# Patient Record
Sex: Female | Born: 1940 | Race: White | Hispanic: No | Smoking: Former smoker
Health system: Southern US, Community
[De-identification: ages and names within clinical notes are randomized; demographics above are authoritative.]

## PROBLEM LIST (undated history)

## (undated) DIAGNOSIS — Z1211 Encounter for screening for malignant neoplasm of colon: Secondary | ICD-10-CM

## (undated) DIAGNOSIS — Z853 Personal history of malignant neoplasm of breast: Secondary | ICD-10-CM

## (undated) DIAGNOSIS — N189 Chronic kidney disease, unspecified: Secondary | ICD-10-CM

## (undated) DIAGNOSIS — I2699 Other pulmonary embolism without acute cor pulmonale: Secondary | ICD-10-CM

## (undated) DIAGNOSIS — E042 Nontoxic multinodular goiter: Secondary | ICD-10-CM

## (undated) DIAGNOSIS — T7840XA Allergy, unspecified, initial encounter: Secondary | ICD-10-CM

## (undated) DIAGNOSIS — M1990 Unspecified osteoarthritis, unspecified site: Secondary | ICD-10-CM

## (undated) DIAGNOSIS — I10 Essential (primary) hypertension: Secondary | ICD-10-CM

## (undated) DIAGNOSIS — I639 Cerebral infarction, unspecified: Secondary | ICD-10-CM

## (undated) DIAGNOSIS — J189 Pneumonia, unspecified organism: Secondary | ICD-10-CM

## (undated) DIAGNOSIS — F419 Anxiety disorder, unspecified: Secondary | ICD-10-CM

## (undated) DIAGNOSIS — C801 Malignant (primary) neoplasm, unspecified: Secondary | ICD-10-CM

## (undated) DIAGNOSIS — K219 Gastro-esophageal reflux disease without esophagitis: Secondary | ICD-10-CM

## (undated) DIAGNOSIS — Z1239 Encounter for other screening for malignant neoplasm of breast: Secondary | ICD-10-CM

## (undated) DIAGNOSIS — Z87442 Personal history of urinary calculi: Secondary | ICD-10-CM

## (undated) DIAGNOSIS — J392 Other diseases of pharynx: Secondary | ICD-10-CM

## (undated) DIAGNOSIS — E349 Endocrine disorder, unspecified: Secondary | ICD-10-CM

## (undated) DIAGNOSIS — J449 Chronic obstructive pulmonary disease, unspecified: Secondary | ICD-10-CM

## (undated) DIAGNOSIS — R011 Cardiac murmur, unspecified: Secondary | ICD-10-CM

## (undated) DIAGNOSIS — Z992 Dependence on renal dialysis: Secondary | ICD-10-CM

## (undated) DIAGNOSIS — I509 Heart failure, unspecified: Secondary | ICD-10-CM

## (undated) DIAGNOSIS — Z87891 Personal history of nicotine dependence: Secondary | ICD-10-CM

## (undated) HISTORY — PX: OTHER SURGICAL HISTORY: SHX169

## (undated) HISTORY — DX: Heart failure, unspecified: I50.9

## (undated) HISTORY — DX: Cerebral infarction, unspecified: I63.9

## (undated) HISTORY — DX: Other pulmonary embolism without acute cor pulmonale: I26.99

## (undated) HISTORY — DX: Personal history of malignant neoplasm of breast: Z85.3

## (undated) HISTORY — DX: Endocrine disorder, unspecified: E34.9

## (undated) HISTORY — DX: Malignant (primary) neoplasm, unspecified: C80.1

## (undated) HISTORY — DX: Personal history of nicotine dependence: Z87.891

## (undated) HISTORY — DX: Chronic obstructive pulmonary disease, unspecified: J44.9

## (undated) HISTORY — DX: Allergy, unspecified, initial encounter: T78.40XA

## (undated) HISTORY — DX: Dependence on renal dialysis: Z99.2

## (undated) HISTORY — DX: Encounter for screening for malignant neoplasm of colon: Z12.11

## (undated) HISTORY — DX: Essential (primary) hypertension: I10

## (undated) HISTORY — DX: Encounter for other screening for malignant neoplasm of breast: Z12.39

---
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
## 2003-03-07 HISTORY — PX: FOOT SURGERY: SHX648

## 2003-03-07 HISTORY — PX: APPENDECTOMY: SHX54

## 2003-03-07 HISTORY — PX: BREAST BIOPSY: SHX20

## 2004-02-01 ENCOUNTER — Ambulatory Visit: Payer: Self-pay | Admitting: *Deleted

## 2004-02-01 ENCOUNTER — Ambulatory Visit (HOSPITAL_COMMUNITY): Admission: RE | Admit: 2004-02-01 | Discharge: 2004-02-01 | Payer: Self-pay | Admitting: *Deleted

## 2004-02-08 ENCOUNTER — Ambulatory Visit: Payer: Self-pay | Admitting: Cardiology

## 2004-02-08 ENCOUNTER — Ambulatory Visit (HOSPITAL_COMMUNITY): Admission: RE | Admit: 2004-02-08 | Discharge: 2004-02-08 | Payer: Self-pay | Admitting: *Deleted

## 2004-02-17 ENCOUNTER — Ambulatory Visit: Payer: Self-pay | Admitting: *Deleted

## 2004-03-06 DIAGNOSIS — I10 Essential (primary) hypertension: Secondary | ICD-10-CM

## 2004-03-06 HISTORY — DX: Essential (primary) hypertension: I10

## 2004-04-14 ENCOUNTER — Ambulatory Visit: Payer: Self-pay | Admitting: General Surgery

## 2005-03-06 HISTORY — PX: COLONOSCOPY: SHX174

## 2005-04-27 ENCOUNTER — Ambulatory Visit: Payer: Self-pay | Admitting: General Surgery

## 2006-03-06 DIAGNOSIS — C801 Malignant (primary) neoplasm, unspecified: Secondary | ICD-10-CM

## 2006-03-06 DIAGNOSIS — E349 Endocrine disorder, unspecified: Secondary | ICD-10-CM

## 2006-03-06 DIAGNOSIS — Z853 Personal history of malignant neoplasm of breast: Secondary | ICD-10-CM

## 2006-03-06 HISTORY — DX: Endocrine disorder, unspecified: E34.9

## 2006-03-06 HISTORY — PX: BREAST SURGERY: SHX581

## 2006-03-06 HISTORY — DX: Malignant (primary) neoplasm, unspecified: C80.1

## 2006-03-06 HISTORY — DX: Personal history of malignant neoplasm of breast: Z85.3

## 2006-04-10 ENCOUNTER — Ambulatory Visit (HOSPITAL_COMMUNITY): Admission: RE | Admit: 2006-04-10 | Discharge: 2006-04-10 | Payer: Self-pay | Admitting: Pulmonary Disease

## 2006-04-20 ENCOUNTER — Ambulatory Visit: Payer: Self-pay

## 2006-04-25 ENCOUNTER — Ambulatory Visit (HOSPITAL_COMMUNITY): Admission: RE | Admit: 2006-04-25 | Discharge: 2006-04-25 | Payer: Self-pay | Admitting: Gastroenterology

## 2006-04-25 ENCOUNTER — Ambulatory Visit: Payer: Self-pay | Admitting: Gastroenterology

## 2006-05-08 ENCOUNTER — Ambulatory Visit: Payer: Self-pay | Admitting: General Surgery

## 2006-05-11 ENCOUNTER — Ambulatory Visit: Payer: Self-pay | Admitting: General Surgery

## 2006-05-24 ENCOUNTER — Ambulatory Visit: Payer: Self-pay

## 2006-08-15 ENCOUNTER — Ambulatory Visit: Payer: Self-pay | Admitting: Gastroenterology

## 2006-10-29 ENCOUNTER — Ambulatory Visit: Payer: Self-pay | Admitting: General Surgery

## 2006-11-12 ENCOUNTER — Ambulatory Visit: Payer: Self-pay | Admitting: General Surgery

## 2006-12-05 ENCOUNTER — Ambulatory Visit: Payer: Self-pay | Admitting: Radiation Oncology

## 2006-12-06 ENCOUNTER — Ambulatory Visit: Payer: Self-pay | Admitting: General Surgery

## 2006-12-24 ENCOUNTER — Ambulatory Visit: Payer: Self-pay | Admitting: Radiation Oncology

## 2006-12-28 ENCOUNTER — Ambulatory Visit: Payer: Self-pay | Admitting: General Surgery

## 2007-01-05 ENCOUNTER — Ambulatory Visit: Payer: Self-pay | Admitting: Radiation Oncology

## 2007-01-07 ENCOUNTER — Ambulatory Visit: Payer: Self-pay | Admitting: Radiation Oncology

## 2007-02-04 ENCOUNTER — Ambulatory Visit: Payer: Self-pay | Admitting: Radiation Oncology

## 2007-03-07 ENCOUNTER — Ambulatory Visit: Payer: Self-pay | Admitting: Radiation Oncology

## 2007-04-07 ENCOUNTER — Ambulatory Visit: Payer: Self-pay | Admitting: Radiation Oncology

## 2007-04-16 ENCOUNTER — Ambulatory Visit: Payer: Self-pay

## 2007-04-19 ENCOUNTER — Ambulatory Visit (HOSPITAL_COMMUNITY): Admission: RE | Admit: 2007-04-19 | Discharge: 2007-04-19 | Payer: Self-pay | Admitting: Pulmonary Disease

## 2007-05-05 ENCOUNTER — Ambulatory Visit: Payer: Self-pay | Admitting: Radiation Oncology

## 2007-05-09 ENCOUNTER — Ambulatory Visit: Payer: Self-pay | Admitting: Oncology

## 2007-06-05 ENCOUNTER — Ambulatory Visit: Payer: Self-pay | Admitting: Oncology

## 2007-06-05 ENCOUNTER — Ambulatory Visit: Payer: Self-pay | Admitting: Radiation Oncology

## 2007-06-24 ENCOUNTER — Ambulatory Visit: Payer: Self-pay | Admitting: General Surgery

## 2007-07-05 ENCOUNTER — Ambulatory Visit: Payer: Self-pay | Admitting: Oncology

## 2007-07-05 ENCOUNTER — Ambulatory Visit: Payer: Self-pay | Admitting: Radiation Oncology

## 2007-08-05 ENCOUNTER — Ambulatory Visit: Payer: Self-pay | Admitting: Oncology

## 2007-08-08 ENCOUNTER — Ambulatory Visit: Payer: Self-pay | Admitting: Oncology

## 2007-09-04 ENCOUNTER — Ambulatory Visit: Payer: Self-pay | Admitting: Oncology

## 2007-10-05 ENCOUNTER — Ambulatory Visit: Payer: Self-pay | Admitting: Oncology

## 2007-11-05 ENCOUNTER — Ambulatory Visit: Payer: Self-pay | Admitting: Oncology

## 2007-11-08 ENCOUNTER — Ambulatory Visit (HOSPITAL_COMMUNITY): Admission: RE | Admit: 2007-11-08 | Discharge: 2007-11-08 | Payer: Self-pay | Admitting: Pulmonary Disease

## 2007-11-20 ENCOUNTER — Ambulatory Visit: Payer: Self-pay | Admitting: Oncology

## 2007-11-28 ENCOUNTER — Emergency Department: Payer: Self-pay | Admitting: Emergency Medicine

## 2007-11-28 ENCOUNTER — Other Ambulatory Visit: Payer: Self-pay

## 2007-12-05 ENCOUNTER — Ambulatory Visit: Payer: Self-pay | Admitting: Oncology

## 2007-12-10 ENCOUNTER — Ambulatory Visit (HOSPITAL_COMMUNITY): Admission: RE | Admit: 2007-12-10 | Discharge: 2007-12-10 | Payer: Self-pay | Admitting: Pulmonary Disease

## 2007-12-11 ENCOUNTER — Ambulatory Visit: Payer: Self-pay | Admitting: General Surgery

## 2008-02-04 ENCOUNTER — Ambulatory Visit: Payer: Self-pay | Admitting: Oncology

## 2008-02-12 ENCOUNTER — Ambulatory Visit (HOSPITAL_COMMUNITY): Admission: RE | Admit: 2008-02-12 | Discharge: 2008-02-12 | Payer: Self-pay | Admitting: Pulmonary Disease

## 2008-02-19 ENCOUNTER — Ambulatory Visit: Payer: Self-pay | Admitting: Oncology

## 2008-03-06 ENCOUNTER — Ambulatory Visit: Payer: Self-pay | Admitting: Oncology

## 2008-04-06 ENCOUNTER — Ambulatory Visit: Payer: Self-pay | Admitting: Oncology

## 2008-06-04 ENCOUNTER — Ambulatory Visit: Payer: Self-pay | Admitting: Oncology

## 2008-06-04 ENCOUNTER — Ambulatory Visit: Payer: Self-pay | Admitting: General Surgery

## 2008-06-17 ENCOUNTER — Ambulatory Visit: Payer: Self-pay | Admitting: Oncology

## 2008-06-17 ENCOUNTER — Ambulatory Visit: Payer: Self-pay | Admitting: General Surgery

## 2008-07-04 ENCOUNTER — Ambulatory Visit: Payer: Self-pay | Admitting: Oncology

## 2008-09-03 ENCOUNTER — Ambulatory Visit: Payer: Self-pay | Admitting: Oncology

## 2008-09-21 ENCOUNTER — Ambulatory Visit (HOSPITAL_COMMUNITY): Admission: RE | Admit: 2008-09-21 | Discharge: 2008-09-21 | Payer: Self-pay | Admitting: Nephrology

## 2008-09-23 ENCOUNTER — Ambulatory Visit: Payer: Self-pay | Admitting: Oncology

## 2008-10-04 ENCOUNTER — Ambulatory Visit: Payer: Self-pay | Admitting: Oncology

## 2008-11-04 ENCOUNTER — Ambulatory Visit: Payer: Self-pay | Admitting: Oncology

## 2008-12-04 ENCOUNTER — Ambulatory Visit: Payer: Self-pay | Admitting: Oncology

## 2008-12-24 ENCOUNTER — Ambulatory Visit: Payer: Self-pay | Admitting: Oncology

## 2009-01-04 ENCOUNTER — Ambulatory Visit: Payer: Self-pay | Admitting: Oncology

## 2009-03-06 ENCOUNTER — Ambulatory Visit: Payer: Self-pay | Admitting: Oncology

## 2009-03-17 ENCOUNTER — Ambulatory Visit: Payer: Self-pay | Admitting: Oncology

## 2009-04-06 ENCOUNTER — Ambulatory Visit: Payer: Self-pay | Admitting: Oncology

## 2009-06-30 ENCOUNTER — Ambulatory Visit: Payer: Self-pay | Admitting: General Surgery

## 2009-09-03 ENCOUNTER — Ambulatory Visit: Payer: Self-pay | Admitting: Oncology

## 2009-09-22 ENCOUNTER — Ambulatory Visit: Payer: Self-pay | Admitting: Oncology

## 2009-10-04 ENCOUNTER — Ambulatory Visit: Payer: Self-pay | Admitting: Oncology

## 2009-10-06 ENCOUNTER — Ambulatory Visit: Payer: Self-pay | Admitting: Oncology

## 2010-02-22 ENCOUNTER — Ambulatory Visit (HOSPITAL_COMMUNITY)
Admission: RE | Admit: 2010-02-22 | Discharge: 2010-02-22 | Payer: Self-pay | Source: Home / Self Care | Attending: Pulmonary Disease | Admitting: Pulmonary Disease

## 2010-03-06 DIAGNOSIS — Z87891 Personal history of nicotine dependence: Secondary | ICD-10-CM

## 2010-03-06 HISTORY — PX: REPLACEMENT TOTAL KNEE: SUR1224

## 2010-03-06 HISTORY — DX: Personal history of nicotine dependence: Z87.891

## 2010-03-16 ENCOUNTER — Ambulatory Visit: Payer: Self-pay | Admitting: Oncology

## 2010-04-06 ENCOUNTER — Ambulatory Visit: Payer: Self-pay | Admitting: Oncology

## 2010-07-07 ENCOUNTER — Ambulatory Visit: Payer: Self-pay | Admitting: General Surgery

## 2010-07-19 NOTE — Assessment & Plan Note (Signed)
NAMEMarland Kitchen  REMMI, ARMENTEROS                 CHART#:  [id number]   DATE:  08/15/2006                       DOB:  1940/10/09   PROBLEM LIST:  1. Hypertension.  2. Diverticulosis.  3. Intermittent diarrhea.   REFERRING PHYSICIAN:  Shaune Pollack   SUBJECTIVE:  Ms. Morganti is a 70 year old female who was seen by me in  February 2008 for a colonoscopy.  In March 2008 she was on antibiotics  for sinus infection for a week. She presents as a return patient visit  due to sudden onset of watery stool.  She began having problems in the  middle of April with a burst of watery stool.  She woke up in the middle  of the night and messed her bed.  Then she began to have watery stool,  without warning once or twice a day.  These symptoms lasted 2 to 3 days  and then she had normal stool.  In the middle of May the same thing  happened.  She was awakened in the middle of the night with watery  stool.  She thought in May, that it was related to eating bad hamburger.  She had an episode of vomiting that was associated with the episode in  May.  She reports that every night she drinks a cup of milk and eats  cookies because she does not want to have weak spells. She also may eat  a bowel of cereal as well. The first episode was a large volume of stool  that did not contain any blood and was not black. She may have little  bits of stool thereafter that are watery, sort of grainy.   She denies any abdominal pain associated with these episodes.  She has  not had any flushing or back pain.  She has not traveled anywhere.  She  has well water and is not sure when it was last tested.  She does visit  a nursing home at least once a month.  She does not eat any sugar free  gum or candy.  She does eat 1 to 2 peppermint a day.  She drinks a  carbonated beverage every 2 days.  She does not consume any apple juice.  She occasionally drinks orange juice when she picks up trash for her  son.  She thinks she has lost about 1  to 3 pounds during this time  period.  When she is not having one of these episodes she has normal  formed stool.  The episodes of watery stool have been so bad that she  has lost control of her bowels and had accidents in her clothes and in  her bed.   PAST MEDICAL HISTORY:  Hypertension.   PAST SURGICAL HISTORY:  1. Tonsillectomy.  2. Splenectomy.  3. Appendectomy.   ALLERGIES:  Sulfa and Biaxin.   MEDICATIONS:  1. Aspirin 81 mg daily.  2. Tarka daily.  3. Prilosec 20 mg daily.   FAMILY HISTORY:  She has no family history of colon cancer or colon  polyps, pancreas or liver disease.   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  She smokes 1 pack a day.  She  drinks 4 to 5 cups of coffee a day  with creamer.  She denies any  alcohol use.   REVIEW OF  SYSTEMS:  Per the HPI, otherwise all systems are negative.  She denies any fever during these episodes of  watery diarrhea.   PHYSICAL EXAMINATION:  Weight 140 pounds.  Height 5 foot 8 inches.  Body  mass index 21.3 (healthy).  Temperature is 98.5, blood pressure 142/90.  Pulse 80.  GENERAL:  She is in no apparent distress. Alert and oriented x4. HEENT:  Atraumatic.  Normocephalic.  Pupils equal and reactive to light.  Mouth  no oral lesions.  Posterior pharynx without erythema or exudate.  Her  neck has full range of motion and no lymphadenopathy. Her lungs were  clear to auscultation bilaterally.  Her cardiovascular exam was a regular rhythm, no murmur, no S1 and S2.  Her abdomen bowel sounds are present, soft, nontender, nondistended, no  rebound, no guarding, no pulsatile masses, no abdominal bruits.  Her  extremities without cyanosis, clubbing or edema.  NEURO:  She has no  focal  neurologic deficits.  RECTAL:  She has fair sphincter tone, no masses, heme negative.   LABS:  January 2008: white count 9.8, hemoglobin 14, platelet count 247,  BUN 17, creatinine 1.16, albumin 3.5, AST 15, ALT 8, TSH 0.648.   ASSESSMENT:  Ms.  Neria is a 70 year old female with intermittent  explosive watery diarrhea with  fecal incontinence.  Her ability to  tolerate liquid stools, impaired by her fair sphincter tone.  Her  intermittent watery stool may be related to lactose intolerance, thyroid  disease, functional gut disorder, and a low likelihood of carcinoid  syndrome, C. differential colitis, giardiasis or viral gastroenteritis  which is now resolved.  Thank you for allowing me to see Ms. Tony in  consultation.  My recommendations follows.   RECOMMENDATIONS:  1. Ms. Hollenkamp has been asked to avoid consuming milk products prior      to going to bed.  She has also been asked to add fiber to her diet.      She is given a hand-out on a lactose free diet and high fiber diet.  2. Will check a CMP, TSH and a CBC on today.  If she has evidence of      protein deficiency or anemia, then I would be more aggressive about      pursuing rare causes for diarrhea in this 70 year old lady.  3. Will obtain records from Dr. Juanetta Gosling from the past 6 months.  4. She is instructed that if her watery stool returns, then I will      obtain stool samples at that time.  She is currently having formed      stool and so it would not be of any great diagnostic      benefit to obtain stool samples at this time.  5. She has a followup appointment to see me in 1 month.       Kassie Mends, M.D.  Electronically Signed     SM/MEDQ  D:  08/16/2006  T:  08/16/2006  Job:  [id number]   cc:   Ramon Dredge L. Juanetta Gosling, M.D.

## 2010-07-22 NOTE — Op Note (Signed)
NAME:  Lydia Clark, Lydia Clark                ACCOUNT NO.:  [id number]   MEDICAL RECORD NO.:  [id number]          PATIENT TYPE:  AMB   LOCATION:  DAY                           FACILITY:  [facility]   PHYSICIAN:  Kassie Mends, M.D.      DATE OF BIRTH:  [birth year]   DATE OF PROCEDURE:  DATE OF DISCHARGE:                                PROCEDURE NOTE   REFERRING PHYSICIAN:  Edward L. Juanetta Gosling, M.D.   PROCEDURE:  Colonoscopy.   INDICATION FOR EXAM:  Ms. Janes is a 70 year old female who presents  for average risk colon cancer screening.   FINDINGS:  1. Rare sigmoid diverticulosis.  Otherwise normal colon without      evidence of masses, polyps, inflammatory changes or AVMs.  2. Normal retroflex view of the rectum.   RECOMMENDATIONS:  1. High fiber diet.  Ms. Debruler was given a handout on high fiber      diet and diverticulosis.  2. Screening colonoscopy in 10 years.   MEDICATIONS:  See nursing sheet.   PROCEDURE TECHNIQUE:  Physical exam was performed and informed consent  was obtained for the patient after explaining the benefits, risks and  alternatives to the procedure.  The patient was connected to the monitor  and placed in the left lateral position.  Continuous oxygen was provided  by nasal cannula and IV medicine administered through an indwelling  cannula.  After administration of sedation and rectal exam, the  patient's rectum was intubated and the scope was advanced under direct  visualization to the cecum.  The scope was subsequently removed slowly  by carefully examining the color,  anatomy, texture, and the integrity  of the mucosa on the way out.  The patient was recovering in the  endoscopy suite and discharged home in satisfactory condition.      Kassie Mends, M.D.  Electronically Signed    SM/MEDQ  D:  04/25/2006  T:  04/25/2006  Job:  [id number]   cc:   Ramon Dredge L. Juanetta Gosling, M.D.  Fax: [phone]

## 2010-09-28 ENCOUNTER — Ambulatory Visit: Payer: Self-pay | Admitting: Oncology

## 2010-10-05 ENCOUNTER — Ambulatory Visit: Payer: Self-pay | Admitting: Oncology

## 2010-10-10 ENCOUNTER — Ambulatory Visit: Payer: Self-pay | Admitting: Orthopedic Surgery

## 2010-10-12 ENCOUNTER — Ambulatory Visit: Payer: Self-pay | Admitting: Oncology

## 2010-10-20 ENCOUNTER — Inpatient Hospital Stay: Payer: Self-pay | Admitting: Orthopedic Surgery

## 2010-10-21 LAB — PATHOLOGY REPORT

## 2010-11-05 ENCOUNTER — Ambulatory Visit: Payer: Self-pay | Admitting: Oncology

## 2011-03-07 DIAGNOSIS — Z1211 Encounter for screening for malignant neoplasm of colon: Secondary | ICD-10-CM

## 2011-03-07 DIAGNOSIS — Z1239 Encounter for other screening for malignant neoplasm of breast: Secondary | ICD-10-CM

## 2011-03-07 HISTORY — DX: Encounter for screening for malignant neoplasm of colon: Z12.11

## 2011-03-07 HISTORY — DX: Encounter for other screening for malignant neoplasm of breast: Z12.39

## 2011-03-15 ENCOUNTER — Ambulatory Visit: Payer: Self-pay | Admitting: Oncology

## 2011-03-15 DIAGNOSIS — M7989 Other specified soft tissue disorders: Secondary | ICD-10-CM | POA: Diagnosis not present

## 2011-03-15 DIAGNOSIS — R0789 Other chest pain: Secondary | ICD-10-CM | POA: Diagnosis not present

## 2011-03-15 DIAGNOSIS — Z170 Estrogen receptor positive status [ER+]: Secondary | ICD-10-CM | POA: Diagnosis not present

## 2011-03-15 DIAGNOSIS — C50919 Malignant neoplasm of unspecified site of unspecified female breast: Secondary | ICD-10-CM | POA: Diagnosis not present

## 2011-03-15 DIAGNOSIS — Z79811 Long term (current) use of aromatase inhibitors: Secondary | ICD-10-CM | POA: Diagnosis not present

## 2011-03-15 DIAGNOSIS — Z09 Encounter for follow-up examination after completed treatment for conditions other than malignant neoplasm: Secondary | ICD-10-CM | POA: Diagnosis not present

## 2011-04-03 DIAGNOSIS — M79609 Pain in unspecified limb: Secondary | ICD-10-CM | POA: Diagnosis not present

## 2011-04-03 DIAGNOSIS — R0789 Other chest pain: Secondary | ICD-10-CM | POA: Diagnosis not present

## 2011-04-03 DIAGNOSIS — C50919 Malignant neoplasm of unspecified site of unspecified female breast: Secondary | ICD-10-CM | POA: Diagnosis not present

## 2011-04-03 DIAGNOSIS — Z79811 Long term (current) use of aromatase inhibitors: Secondary | ICD-10-CM | POA: Diagnosis not present

## 2011-04-03 DIAGNOSIS — Z170 Estrogen receptor positive status [ER+]: Secondary | ICD-10-CM | POA: Diagnosis not present

## 2011-04-03 DIAGNOSIS — Z09 Encounter for follow-up examination after completed treatment for conditions other than malignant neoplasm: Secondary | ICD-10-CM | POA: Diagnosis not present

## 2011-04-03 DIAGNOSIS — M7989 Other specified soft tissue disorders: Secondary | ICD-10-CM | POA: Diagnosis not present

## 2011-04-03 DIAGNOSIS — R609 Edema, unspecified: Secondary | ICD-10-CM | POA: Diagnosis not present

## 2011-04-07 ENCOUNTER — Ambulatory Visit: Payer: Self-pay | Admitting: Oncology

## 2011-04-12 DIAGNOSIS — R609 Edema, unspecified: Secondary | ICD-10-CM | POA: Diagnosis not present

## 2011-04-14 ENCOUNTER — Ambulatory Visit (HOSPITAL_COMMUNITY)
Admission: RE | Admit: 2011-04-14 | Discharge: 2011-04-14 | Disposition: A | Discharge status: Home / Self Care | Payer: Medicare Other | Source: Ambulatory Visit | Attending: Pulmonary Disease | Admitting: Pulmonary Disease

## 2011-04-14 DIAGNOSIS — C50919 Malignant neoplasm of unspecified site of unspecified female breast: Secondary | ICD-10-CM | POA: Insufficient documentation

## 2011-04-14 DIAGNOSIS — M7989 Other specified soft tissue disorders: Secondary | ICD-10-CM | POA: Insufficient documentation

## 2011-04-14 DIAGNOSIS — I517 Cardiomegaly: Secondary | ICD-10-CM | POA: Diagnosis not present

## 2011-04-14 NOTE — Progress Notes (Signed)
*  PRELIMINARY RESULTS* Echocardiogram 2D Echocardiogram has been performed.  Lydia Clark, Lydia Clark 04/14/2011, 11:41 AM

## 2011-04-26 DIAGNOSIS — I10 Essential (primary) hypertension: Secondary | ICD-10-CM | POA: Diagnosis not present

## 2011-04-26 DIAGNOSIS — IMO0001 Reserved for inherently not codable concepts without codable children: Secondary | ICD-10-CM | POA: Diagnosis not present

## 2011-04-26 DIAGNOSIS — E785 Hyperlipidemia, unspecified: Secondary | ICD-10-CM | POA: Diagnosis not present

## 2011-04-26 DIAGNOSIS — R609 Edema, unspecified: Secondary | ICD-10-CM | POA: Diagnosis not present

## 2011-04-26 DIAGNOSIS — Z79899 Other long term (current) drug therapy: Secondary | ICD-10-CM | POA: Diagnosis not present

## 2011-05-10 DIAGNOSIS — Z96659 Presence of unspecified artificial knee joint: Secondary | ICD-10-CM | POA: Diagnosis not present

## 2011-06-15 DIAGNOSIS — I10 Essential (primary) hypertension: Secondary | ICD-10-CM | POA: Diagnosis not present

## 2011-06-15 DIAGNOSIS — R609 Edema, unspecified: Secondary | ICD-10-CM | POA: Diagnosis not present

## 2011-07-10 ENCOUNTER — Ambulatory Visit: Payer: Self-pay | Admitting: General Surgery

## 2011-07-10 DIAGNOSIS — Z853 Personal history of malignant neoplasm of breast: Secondary | ICD-10-CM | POA: Diagnosis not present

## 2011-07-10 DIAGNOSIS — N6489 Other specified disorders of breast: Secondary | ICD-10-CM | POA: Diagnosis not present

## 2011-07-25 DIAGNOSIS — M545 Low back pain: Secondary | ICD-10-CM | POA: Diagnosis not present

## 2011-07-25 DIAGNOSIS — M999 Biomechanical lesion, unspecified: Secondary | ICD-10-CM | POA: Diagnosis not present

## 2011-08-01 DIAGNOSIS — M7989 Other specified soft tissue disorders: Secondary | ICD-10-CM | POA: Diagnosis not present

## 2011-08-01 DIAGNOSIS — Z853 Personal history of malignant neoplasm of breast: Secondary | ICD-10-CM | POA: Diagnosis not present

## 2011-08-01 DIAGNOSIS — E079 Disorder of thyroid, unspecified: Secondary | ICD-10-CM | POA: Diagnosis not present

## 2011-08-03 DIAGNOSIS — M7989 Other specified soft tissue disorders: Secondary | ICD-10-CM | POA: Diagnosis not present

## 2011-08-08 DIAGNOSIS — M545 Low back pain: Secondary | ICD-10-CM | POA: Diagnosis not present

## 2011-08-08 DIAGNOSIS — M999 Biomechanical lesion, unspecified: Secondary | ICD-10-CM | POA: Diagnosis not present

## 2011-08-14 DIAGNOSIS — M545 Low back pain: Secondary | ICD-10-CM | POA: Diagnosis not present

## 2011-08-14 DIAGNOSIS — M999 Biomechanical lesion, unspecified: Secondary | ICD-10-CM | POA: Diagnosis not present

## 2011-08-15 DIAGNOSIS — M999 Biomechanical lesion, unspecified: Secondary | ICD-10-CM | POA: Diagnosis not present

## 2011-08-15 DIAGNOSIS — M545 Low back pain: Secondary | ICD-10-CM | POA: Diagnosis not present

## 2011-08-17 DIAGNOSIS — M545 Low back pain: Secondary | ICD-10-CM | POA: Diagnosis not present

## 2011-08-17 DIAGNOSIS — M999 Biomechanical lesion, unspecified: Secondary | ICD-10-CM | POA: Diagnosis not present

## 2011-09-15 DIAGNOSIS — I10 Essential (primary) hypertension: Secondary | ICD-10-CM | POA: Diagnosis not present

## 2011-09-15 DIAGNOSIS — Z0000 Encounter for general adult medical examination without abnormal findings: Secondary | ICD-10-CM | POA: Diagnosis not present

## 2011-09-15 DIAGNOSIS — Z124 Encounter for screening for malignant neoplasm of cervix: Secondary | ICD-10-CM | POA: Diagnosis not present

## 2011-09-28 DIAGNOSIS — E079 Disorder of thyroid, unspecified: Secondary | ICD-10-CM | POA: Diagnosis not present

## 2011-10-02 ENCOUNTER — Ambulatory Visit: Payer: Self-pay | Admitting: Oncology

## 2011-10-02 DIAGNOSIS — C50919 Malignant neoplasm of unspecified site of unspecified female breast: Secondary | ICD-10-CM | POA: Diagnosis not present

## 2011-10-02 DIAGNOSIS — Z79811 Long term (current) use of aromatase inhibitors: Secondary | ICD-10-CM | POA: Diagnosis not present

## 2011-10-02 DIAGNOSIS — Z79899 Other long term (current) drug therapy: Secondary | ICD-10-CM | POA: Diagnosis not present

## 2011-10-02 DIAGNOSIS — M899 Disorder of bone, unspecified: Secondary | ICD-10-CM | POA: Diagnosis not present

## 2011-10-02 DIAGNOSIS — Z9089 Acquired absence of other organs: Secondary | ICD-10-CM | POA: Diagnosis not present

## 2011-10-02 DIAGNOSIS — I10 Essential (primary) hypertension: Secondary | ICD-10-CM | POA: Diagnosis not present

## 2011-10-02 DIAGNOSIS — M949 Disorder of cartilage, unspecified: Secondary | ICD-10-CM | POA: Diagnosis not present

## 2011-10-02 DIAGNOSIS — F329 Major depressive disorder, single episode, unspecified: Secondary | ICD-10-CM | POA: Diagnosis not present

## 2011-10-02 DIAGNOSIS — Z7982 Long term (current) use of aspirin: Secondary | ICD-10-CM | POA: Diagnosis not present

## 2011-10-02 DIAGNOSIS — Z170 Estrogen receptor positive status [ER+]: Secondary | ICD-10-CM | POA: Diagnosis not present

## 2011-10-05 ENCOUNTER — Ambulatory Visit: Payer: Self-pay | Admitting: Oncology

## 2011-10-05 DIAGNOSIS — Z13820 Encounter for screening for osteoporosis: Secondary | ICD-10-CM | POA: Diagnosis not present

## 2011-10-05 DIAGNOSIS — M899 Disorder of bone, unspecified: Secondary | ICD-10-CM | POA: Diagnosis not present

## 2011-10-05 DIAGNOSIS — C50919 Malignant neoplasm of unspecified site of unspecified female breast: Secondary | ICD-10-CM | POA: Diagnosis not present

## 2011-10-05 DIAGNOSIS — Z79811 Long term (current) use of aromatase inhibitors: Secondary | ICD-10-CM | POA: Diagnosis not present

## 2011-10-05 DIAGNOSIS — Z170 Estrogen receptor positive status [ER+]: Secondary | ICD-10-CM | POA: Diagnosis not present

## 2011-10-05 DIAGNOSIS — M818 Other osteoporosis without current pathological fracture: Secondary | ICD-10-CM | POA: Diagnosis not present

## 2011-10-13 DIAGNOSIS — R1310 Dysphagia, unspecified: Secondary | ICD-10-CM | POA: Diagnosis not present

## 2011-10-13 DIAGNOSIS — K219 Gastro-esophageal reflux disease without esophagitis: Secondary | ICD-10-CM | POA: Diagnosis not present

## 2011-10-13 DIAGNOSIS — E041 Nontoxic single thyroid nodule: Secondary | ICD-10-CM | POA: Diagnosis not present

## 2011-10-13 DIAGNOSIS — R490 Dysphonia: Secondary | ICD-10-CM | POA: Diagnosis not present

## 2011-10-17 DIAGNOSIS — Z13820 Encounter for screening for osteoporosis: Secondary | ICD-10-CM | POA: Diagnosis not present

## 2011-11-05 ENCOUNTER — Ambulatory Visit: Payer: Self-pay | Admitting: Oncology

## 2011-11-14 DIAGNOSIS — E041 Nontoxic single thyroid nodule: Secondary | ICD-10-CM | POA: Diagnosis not present

## 2011-11-14 DIAGNOSIS — R1310 Dysphagia, unspecified: Secondary | ICD-10-CM | POA: Diagnosis not present

## 2011-11-15 ENCOUNTER — Other Ambulatory Visit (HOSPITAL_COMMUNITY): Payer: Self-pay | Admitting: Nephrology

## 2011-11-15 DIAGNOSIS — N289 Disorder of kidney and ureter, unspecified: Secondary | ICD-10-CM

## 2011-11-15 DIAGNOSIS — D649 Anemia, unspecified: Secondary | ICD-10-CM | POA: Diagnosis not present

## 2011-11-15 DIAGNOSIS — I10 Essential (primary) hypertension: Secondary | ICD-10-CM | POA: Diagnosis not present

## 2011-11-15 DIAGNOSIS — N184 Chronic kidney disease, stage 4 (severe): Secondary | ICD-10-CM | POA: Diagnosis not present

## 2011-12-07 ENCOUNTER — Ambulatory Visit (HOSPITAL_COMMUNITY)
Admission: RE | Admit: 2011-12-07 | Discharge: 2011-12-07 | Disposition: A | Discharge status: Home / Self Care | Payer: Medicare Other | Source: Ambulatory Visit | Attending: Nephrology | Admitting: Nephrology

## 2011-12-07 DIAGNOSIS — N184 Chronic kidney disease, stage 4 (severe): Secondary | ICD-10-CM | POA: Diagnosis not present

## 2011-12-07 DIAGNOSIS — R809 Proteinuria, unspecified: Secondary | ICD-10-CM | POA: Diagnosis not present

## 2011-12-07 DIAGNOSIS — Q619 Cystic kidney disease, unspecified: Secondary | ICD-10-CM | POA: Diagnosis not present

## 2011-12-07 DIAGNOSIS — N289 Disorder of kidney and ureter, unspecified: Secondary | ICD-10-CM | POA: Insufficient documentation

## 2011-12-07 DIAGNOSIS — N281 Cyst of kidney, acquired: Secondary | ICD-10-CM | POA: Diagnosis not present

## 2011-12-07 DIAGNOSIS — R609 Edema, unspecified: Secondary | ICD-10-CM | POA: Diagnosis not present

## 2011-12-07 DIAGNOSIS — N189 Chronic kidney disease, unspecified: Secondary | ICD-10-CM | POA: Diagnosis not present

## 2011-12-07 DIAGNOSIS — I10 Essential (primary) hypertension: Secondary | ICD-10-CM | POA: Diagnosis not present

## 2011-12-21 DIAGNOSIS — Z23 Encounter for immunization: Secondary | ICD-10-CM | POA: Diagnosis not present

## 2011-12-21 DIAGNOSIS — J449 Chronic obstructive pulmonary disease, unspecified: Secondary | ICD-10-CM | POA: Diagnosis not present

## 2011-12-21 DIAGNOSIS — E785 Hyperlipidemia, unspecified: Secondary | ICD-10-CM | POA: Diagnosis not present

## 2011-12-21 DIAGNOSIS — N19 Unspecified kidney failure: Secondary | ICD-10-CM | POA: Diagnosis not present

## 2011-12-21 DIAGNOSIS — R609 Edema, unspecified: Secondary | ICD-10-CM | POA: Diagnosis not present

## 2012-01-03 DIAGNOSIS — E559 Vitamin D deficiency, unspecified: Secondary | ICD-10-CM | POA: Diagnosis not present

## 2012-01-03 DIAGNOSIS — R809 Proteinuria, unspecified: Secondary | ICD-10-CM | POA: Diagnosis not present

## 2012-01-03 DIAGNOSIS — N184 Chronic kidney disease, stage 4 (severe): Secondary | ICD-10-CM | POA: Diagnosis not present

## 2012-01-03 DIAGNOSIS — I10 Essential (primary) hypertension: Secondary | ICD-10-CM | POA: Diagnosis not present

## 2012-01-24 DIAGNOSIS — Z130 Encounter for screening for diseases of the blood and blood-forming organs and certain disorders involving the immune mechanism: Secondary | ICD-10-CM | POA: Diagnosis not present

## 2012-01-24 DIAGNOSIS — R809 Proteinuria, unspecified: Secondary | ICD-10-CM | POA: Diagnosis not present

## 2012-01-24 DIAGNOSIS — N032 Chronic nephritic syndrome with diffuse membranous glomerulonephritis: Secondary | ICD-10-CM | POA: Diagnosis not present

## 2012-01-25 DIAGNOSIS — N269 Renal sclerosis, unspecified: Secondary | ICD-10-CM | POA: Diagnosis not present

## 2012-02-26 DIAGNOSIS — N184 Chronic kidney disease, stage 4 (severe): Secondary | ICD-10-CM | POA: Diagnosis not present

## 2012-02-26 DIAGNOSIS — I10 Essential (primary) hypertension: Secondary | ICD-10-CM | POA: Diagnosis not present

## 2012-02-26 DIAGNOSIS — R809 Proteinuria, unspecified: Secondary | ICD-10-CM | POA: Diagnosis not present

## 2012-03-06 ENCOUNTER — Ambulatory Visit: Payer: Self-pay | Admitting: Oncology

## 2012-03-06 DIAGNOSIS — Z9089 Acquired absence of other organs: Secondary | ICD-10-CM | POA: Diagnosis not present

## 2012-03-06 DIAGNOSIS — M899 Disorder of bone, unspecified: Secondary | ICD-10-CM | POA: Diagnosis not present

## 2012-03-06 DIAGNOSIS — C50919 Malignant neoplasm of unspecified site of unspecified female breast: Secondary | ICD-10-CM | POA: Diagnosis not present

## 2012-03-06 DIAGNOSIS — Z7982 Long term (current) use of aspirin: Secondary | ICD-10-CM | POA: Diagnosis not present

## 2012-03-06 DIAGNOSIS — Z79811 Long term (current) use of aromatase inhibitors: Secondary | ICD-10-CM | POA: Diagnosis not present

## 2012-03-06 DIAGNOSIS — I10 Essential (primary) hypertension: Secondary | ICD-10-CM | POA: Diagnosis not present

## 2012-03-06 DIAGNOSIS — F329 Major depressive disorder, single episode, unspecified: Secondary | ICD-10-CM | POA: Diagnosis not present

## 2012-03-06 DIAGNOSIS — Z170 Estrogen receptor positive status [ER+]: Secondary | ICD-10-CM | POA: Diagnosis not present

## 2012-03-06 DIAGNOSIS — Z992 Dependence on renal dialysis: Secondary | ICD-10-CM

## 2012-03-06 DIAGNOSIS — F411 Generalized anxiety disorder: Secondary | ICD-10-CM | POA: Diagnosis not present

## 2012-03-06 DIAGNOSIS — Z79899 Other long term (current) drug therapy: Secondary | ICD-10-CM | POA: Diagnosis not present

## 2012-03-06 DIAGNOSIS — N189 Chronic kidney disease, unspecified: Secondary | ICD-10-CM

## 2012-03-06 HISTORY — DX: Chronic kidney disease, unspecified: N18.9

## 2012-03-06 HISTORY — DX: Dependence on renal dialysis: Z99.2

## 2012-03-22 DIAGNOSIS — J449 Chronic obstructive pulmonary disease, unspecified: Secondary | ICD-10-CM | POA: Diagnosis not present

## 2012-03-22 DIAGNOSIS — N19 Unspecified kidney failure: Secondary | ICD-10-CM | POA: Diagnosis not present

## 2012-03-22 DIAGNOSIS — E785 Hyperlipidemia, unspecified: Secondary | ICD-10-CM | POA: Diagnosis not present

## 2012-04-01 DIAGNOSIS — Z79811 Long term (current) use of aromatase inhibitors: Secondary | ICD-10-CM | POA: Diagnosis not present

## 2012-04-01 DIAGNOSIS — C50919 Malignant neoplasm of unspecified site of unspecified female breast: Secondary | ICD-10-CM | POA: Diagnosis not present

## 2012-04-01 DIAGNOSIS — M949 Disorder of cartilage, unspecified: Secondary | ICD-10-CM | POA: Diagnosis not present

## 2012-04-01 DIAGNOSIS — Z170 Estrogen receptor positive status [ER+]: Secondary | ICD-10-CM | POA: Diagnosis not present

## 2012-04-06 ENCOUNTER — Ambulatory Visit: Payer: Self-pay | Admitting: Oncology

## 2012-04-12 DIAGNOSIS — R209 Unspecified disturbances of skin sensation: Secondary | ICD-10-CM | POA: Diagnosis not present

## 2012-04-12 DIAGNOSIS — R609 Edema, unspecified: Secondary | ICD-10-CM | POA: Diagnosis not present

## 2012-04-12 DIAGNOSIS — D649 Anemia, unspecified: Secondary | ICD-10-CM | POA: Diagnosis not present

## 2012-04-12 DIAGNOSIS — N184 Chronic kidney disease, stage 4 (severe): Secondary | ICD-10-CM | POA: Diagnosis not present

## 2012-04-12 DIAGNOSIS — Z853 Personal history of malignant neoplasm of breast: Secondary | ICD-10-CM | POA: Diagnosis not present

## 2012-04-12 DIAGNOSIS — F17200 Nicotine dependence, unspecified, uncomplicated: Secondary | ICD-10-CM | POA: Diagnosis not present

## 2012-04-12 DIAGNOSIS — J449 Chronic obstructive pulmonary disease, unspecified: Secondary | ICD-10-CM | POA: Diagnosis not present

## 2012-04-12 DIAGNOSIS — R112 Nausea with vomiting, unspecified: Secondary | ICD-10-CM | POA: Diagnosis not present

## 2012-04-12 DIAGNOSIS — N032 Chronic nephritic syndrome with diffuse membranous glomerulonephritis: Secondary | ICD-10-CM | POA: Diagnosis not present

## 2012-04-12 DIAGNOSIS — R809 Proteinuria, unspecified: Secondary | ICD-10-CM | POA: Diagnosis not present

## 2012-04-12 DIAGNOSIS — R252 Cramp and spasm: Secondary | ICD-10-CM | POA: Diagnosis not present

## 2012-04-12 DIAGNOSIS — R439 Unspecified disturbances of smell and taste: Secondary | ICD-10-CM | POA: Diagnosis not present

## 2012-04-12 DIAGNOSIS — I129 Hypertensive chronic kidney disease with stage 1 through stage 4 chronic kidney disease, or unspecified chronic kidney disease: Secondary | ICD-10-CM | POA: Diagnosis not present

## 2012-04-12 DIAGNOSIS — R5381 Other malaise: Secondary | ICD-10-CM | POA: Diagnosis not present

## 2012-04-12 DIAGNOSIS — E7800 Pure hypercholesterolemia, unspecified: Secondary | ICD-10-CM | POA: Diagnosis not present

## 2012-05-14 DIAGNOSIS — R1310 Dysphagia, unspecified: Secondary | ICD-10-CM | POA: Diagnosis not present

## 2012-05-14 DIAGNOSIS — E041 Nontoxic single thyroid nodule: Secondary | ICD-10-CM | POA: Diagnosis not present

## 2012-05-14 DIAGNOSIS — L0201 Cutaneous abscess of face: Secondary | ICD-10-CM | POA: Diagnosis not present

## 2012-05-18 DIAGNOSIS — N184 Chronic kidney disease, stage 4 (severe): Secondary | ICD-10-CM | POA: Diagnosis not present

## 2012-05-18 DIAGNOSIS — R809 Proteinuria, unspecified: Secondary | ICD-10-CM | POA: Diagnosis not present

## 2012-05-18 DIAGNOSIS — I10 Essential (primary) hypertension: Secondary | ICD-10-CM | POA: Diagnosis not present

## 2012-05-21 ENCOUNTER — Encounter: Payer: Self-pay | Admitting: *Deleted

## 2012-06-05 ENCOUNTER — Encounter: Payer: Self-pay | Admitting: General Surgery

## 2012-06-13 DIAGNOSIS — Z79899 Other long term (current) drug therapy: Secondary | ICD-10-CM | POA: Diagnosis not present

## 2012-06-13 DIAGNOSIS — D649 Anemia, unspecified: Secondary | ICD-10-CM | POA: Diagnosis not present

## 2012-06-13 DIAGNOSIS — R809 Proteinuria, unspecified: Secondary | ICD-10-CM | POA: Diagnosis not present

## 2012-06-13 DIAGNOSIS — N184 Chronic kidney disease, stage 4 (severe): Secondary | ICD-10-CM | POA: Diagnosis not present

## 2012-06-17 DIAGNOSIS — Z79899 Other long term (current) drug therapy: Secondary | ICD-10-CM | POA: Diagnosis not present

## 2012-06-17 DIAGNOSIS — N189 Chronic kidney disease, unspecified: Secondary | ICD-10-CM | POA: Diagnosis not present

## 2012-06-17 DIAGNOSIS — R809 Proteinuria, unspecified: Secondary | ICD-10-CM | POA: Diagnosis not present

## 2012-06-17 DIAGNOSIS — I10 Essential (primary) hypertension: Secondary | ICD-10-CM | POA: Diagnosis not present

## 2012-06-19 DIAGNOSIS — N185 Chronic kidney disease, stage 5: Secondary | ICD-10-CM | POA: Diagnosis not present

## 2012-06-19 DIAGNOSIS — I10 Essential (primary) hypertension: Secondary | ICD-10-CM | POA: Diagnosis not present

## 2012-06-20 DIAGNOSIS — J449 Chronic obstructive pulmonary disease, unspecified: Secondary | ICD-10-CM | POA: Diagnosis not present

## 2012-06-20 DIAGNOSIS — N19 Unspecified kidney failure: Secondary | ICD-10-CM | POA: Diagnosis not present

## 2012-06-20 DIAGNOSIS — R609 Edema, unspecified: Secondary | ICD-10-CM | POA: Diagnosis not present

## 2012-06-20 DIAGNOSIS — I129 Hypertensive chronic kidney disease with stage 1 through stage 4 chronic kidney disease, or unspecified chronic kidney disease: Secondary | ICD-10-CM | POA: Diagnosis not present

## 2012-06-24 ENCOUNTER — Encounter: Payer: Self-pay | Admitting: Vascular Surgery

## 2012-06-24 DIAGNOSIS — H00029 Hordeolum internum unspecified eye, unspecified eyelid: Secondary | ICD-10-CM | POA: Diagnosis not present

## 2012-06-24 DIAGNOSIS — N186 End stage renal disease: Secondary | ICD-10-CM | POA: Diagnosis not present

## 2012-06-26 ENCOUNTER — Other Ambulatory Visit: Payer: Self-pay | Admitting: *Deleted

## 2012-06-28 ENCOUNTER — Encounter (HOSPITAL_COMMUNITY): Payer: Self-pay | Admitting: Pharmacy Technician

## 2012-06-28 NOTE — Pre-Procedure Instructions (Addendum)
Lydia Clark  06/28/2012   Your procedure is scheduled on: 07/02/12  Report to [facility] at 730 AM.  Call this number if you have problems the morning of surgery: [phone]   Remember:   Do not eat food or drink liquids after midnight.   Take these medicines the morning of surgery with A SIP OF WATER: ativan if needed, advair if needed ,omeprazole ,spirva if needed   Do not wear jewelry, make-up or nail polish.  Do not wear lotions, powders, or perfumes. You may wear deodorant.  Do not shave 48 hours prior to surgery. Men may shave face and neck.  Do not bring valuables to the hospital.  Contacts, dentures or bridgework may not be worn into surgery.  Leave suitcase in the car. After surgery it may be brought to your room.  For patients admitted to the hospital, checkout time is 11:00 AM the day of  discharge.   Patients discharged the day of surgery will not be allowed to drive  home.  Name and phone number of your driver:   Special Instructions: Shower using CHG 2 nights before surgery and the night before surgery.  If you shower the day of surgery use CHG.  Use special wash - you have one bottle of CHG for all showers.  You should use approximately 1/3 of the bottle for each shower.   Please read over the following fact sheets that you were given: Pain Booklet, Coughing and Deep Breathing, MRSA Information and Surgical Site Infection Prevention

## 2012-07-01 ENCOUNTER — Ambulatory Visit (HOSPITAL_COMMUNITY)
Admission: RE | Admit: 2012-07-01 | Discharge: 2012-07-01 | Disposition: A | Discharge status: Home / Self Care | Payer: Medicare Other | Source: Ambulatory Visit | Attending: Anesthesiology | Admitting: Anesthesiology

## 2012-07-01 ENCOUNTER — Encounter (HOSPITAL_COMMUNITY)
Admission: RE | Admit: 2012-07-01 | Discharge: 2012-07-01 | Disposition: A | Discharge status: Home / Self Care | Payer: Medicare Other | Source: Ambulatory Visit | Attending: Vascular Surgery | Admitting: Vascular Surgery

## 2012-07-01 ENCOUNTER — Encounter (HOSPITAL_COMMUNITY): Payer: Self-pay

## 2012-07-01 DIAGNOSIS — Z91038 Other insect allergy status: Secondary | ICD-10-CM | POA: Diagnosis not present

## 2012-07-01 DIAGNOSIS — E042 Nontoxic multinodular goiter: Secondary | ICD-10-CM | POA: Diagnosis not present

## 2012-07-01 DIAGNOSIS — Z882 Allergy status to sulfonamides status: Secondary | ICD-10-CM | POA: Diagnosis not present

## 2012-07-01 DIAGNOSIS — Z853 Personal history of malignant neoplasm of breast: Secondary | ICD-10-CM | POA: Diagnosis not present

## 2012-07-01 DIAGNOSIS — J449 Chronic obstructive pulmonary disease, unspecified: Secondary | ICD-10-CM | POA: Diagnosis not present

## 2012-07-01 DIAGNOSIS — Z888 Allergy status to other drugs, medicaments and biological substances status: Secondary | ICD-10-CM | POA: Diagnosis not present

## 2012-07-01 DIAGNOSIS — J4489 Other specified chronic obstructive pulmonary disease: Secondary | ICD-10-CM | POA: Diagnosis not present

## 2012-07-01 DIAGNOSIS — K219 Gastro-esophageal reflux disease without esophagitis: Secondary | ICD-10-CM | POA: Diagnosis not present

## 2012-07-01 DIAGNOSIS — I120 Hypertensive chronic kidney disease with stage 5 chronic kidney disease or end stage renal disease: Secondary | ICD-10-CM | POA: Diagnosis not present

## 2012-07-01 DIAGNOSIS — Z86711 Personal history of pulmonary embolism: Secondary | ICD-10-CM | POA: Diagnosis not present

## 2012-07-01 DIAGNOSIS — F17200 Nicotine dependence, unspecified, uncomplicated: Secondary | ICD-10-CM | POA: Diagnosis not present

## 2012-07-01 DIAGNOSIS — M129 Arthropathy, unspecified: Secondary | ICD-10-CM | POA: Diagnosis not present

## 2012-07-01 DIAGNOSIS — N186 End stage renal disease: Secondary | ICD-10-CM | POA: Diagnosis not present

## 2012-07-01 HISTORY — DX: Anxiety disorder, unspecified: F41.9

## 2012-07-01 HISTORY — DX: Personal history of urinary calculi: Z87.442

## 2012-07-01 HISTORY — DX: Other diseases of pharynx: J39.2

## 2012-07-01 HISTORY — DX: Nontoxic multinodular goiter: E04.2

## 2012-07-01 HISTORY — DX: Unspecified osteoarthritis, unspecified site: M19.90

## 2012-07-01 HISTORY — DX: Pneumonia, unspecified organism: J18.9

## 2012-07-01 HISTORY — DX: Gastro-esophageal reflux disease without esophagitis: K21.9

## 2012-07-01 HISTORY — DX: Chronic kidney disease, unspecified: N18.9

## 2012-07-01 HISTORY — DX: Cardiac murmur, unspecified: R01.1

## 2012-07-01 LAB — SURGICAL PCR SCREEN: Staphylococcus aureus: NEGATIVE

## 2012-07-01 MED ORDER — DEXTROSE 5 % IV SOLN
1.5000 g | INTRAVENOUS | Status: AC
  Administered 2012-07-02: 1.5 g via INTRAVENOUS
  Filled 2012-07-01: qty 1.5

## 2012-07-01 NOTE — Progress Notes (Addendum)
req'd notes , ultrasound result of throat/neck done 14 at [facility] ent dr Andee Poles. rec'd info .allison consulted with patient

## 2012-07-01 NOTE — Progress Notes (Signed)
Anesthesia PAT Evaluation:  Patient is a 72 year old female scheduled for Diatek catheter insertion on 07/02/12.  She is scheduled to start hemodialysis the following day in [place]. 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  History also lists PE, but she denies known history of this. She is being followed for a "neck mass" by Dr. Andee Poles of [facility].  ENT records indicate she has a multinodular goiter with previous biopsy in ~ 2008 showing colloid nodule. Her last thyroid ultrasound was in July 2013 with plans to repeat July 2013 to determine if repeat biopsy of thyroidectomy is indicated.  Nephrologist is Dr. Wolfgang Phoenix.  PCP is Dr. Kari Baars.  EKG on 07/01/12 showed NSR, LAFB, anterior infarct (poor r wave progression), age undetermined, non-specific ST/T wave abnormality.  Currently, there are no comparison EKGs available.  She had a nuclear stress test > 3 years ago (negative stress Cardiolite study on 02/08/04.  By notes, EKG then showed NSR, LAE, RV conduction abnormality, and delayed R-wave progression).    Echo on 04/14/11 showed: - Left ventricle: The cavity size was normal. There was mild concentric hypertrophy. Systolic function was hyperdynamic. The estimated ejection fraction was in the range of 70% to 75%. Wall motion was normal; there were no regional wall motion abnormalities. - Aortic valve: Trivial regurgitation. - Right ventricle: The cavity size was normal. Wall thickness was mildly increased. - Atrial septum: No defect or patent foramen ovale was identified.  She denies chest pain.  She has had intermittent SOB, particularly in the mornings which she has attributed to her worsening renal function and possibly pollen.  Her edema has improved somewhat with adjustment in toresemide. Before her worsening renal  function within the past few months, she considered herself active--able to climb stairs, do moderate housework. Heart RRR, lungs clear but diminished.  She does have an anterior neck thyroid goiter, more prominent on the left side. She reports she is able to lie flat without breathing difficulties.    CXR report on 07/01/12 showed: Cardiomediastinal silhouette appears normal. No acute pulmonary disease is noted. Bony thorax is intact. No pneumothorax or pleural effusion is noted. Hyperexpansion of the lungs is noted consistent with chronic obstructive pulmonary disease.  She is scheduled for an ISTAT on the day of surgery.  Records received after her PAT visit show previous labs from 06/24/12 with a glucose 65, BUN 81, Cr 4.28, K+ 5.8 (treated with kayexalate), Na 137, Ca 9.4, Ph 6.5.  H/H on 06/13/12 were 11.0/33.0.  Patient is in need of hemodialysis--to start on 07/03/12.  She will require HD access and is scheduled for Diatek insertion tomorrow.  If labs are acceptable then would anticipate she could proceed as planned.  She will be evaluated by her surgeon and anesthesiologist on the day of surgery.  Velna Ochs [facility]/Anesthesiology Phone [phone] 07/01/2012 3:33 PM

## 2012-07-02 ENCOUNTER — Ambulatory Visit (HOSPITAL_COMMUNITY): Payer: Medicare Other | Admitting: Certified Registered"

## 2012-07-02 ENCOUNTER — Ambulatory Visit (HOSPITAL_COMMUNITY): Payer: Medicare Other

## 2012-07-02 ENCOUNTER — Ambulatory Visit: Payer: TRICARE For Life (TFL) | Admitting: Vascular Surgery

## 2012-07-02 ENCOUNTER — Encounter (HOSPITAL_COMMUNITY): Payer: Self-pay | Admitting: Vascular Surgery

## 2012-07-02 ENCOUNTER — Ambulatory Visit (HOSPITAL_COMMUNITY)
Admission: RE | Admit: 2012-07-02 | Discharge: 2012-07-02 | Disposition: A | Discharge status: Home / Self Care | Payer: Medicare Other | Source: Ambulatory Visit | Attending: Vascular Surgery | Admitting: Vascular Surgery

## 2012-07-02 ENCOUNTER — Encounter (HOSPITAL_COMMUNITY): Payer: Self-pay | Admitting: *Deleted

## 2012-07-02 ENCOUNTER — Encounter (HOSPITAL_COMMUNITY)
Admission: RE | Disposition: A | Discharge status: Home / Self Care | Payer: Self-pay | Source: Ambulatory Visit | Attending: Vascular Surgery

## 2012-07-02 DIAGNOSIS — Z853 Personal history of malignant neoplasm of breast: Secondary | ICD-10-CM | POA: Insufficient documentation

## 2012-07-02 DIAGNOSIS — I120 Hypertensive chronic kidney disease with stage 5 chronic kidney disease or end stage renal disease: Secondary | ICD-10-CM | POA: Insufficient documentation

## 2012-07-02 DIAGNOSIS — N186 End stage renal disease: Secondary | ICD-10-CM | POA: Diagnosis not present

## 2012-07-02 DIAGNOSIS — J449 Chronic obstructive pulmonary disease, unspecified: Secondary | ICD-10-CM | POA: Insufficient documentation

## 2012-07-02 DIAGNOSIS — Z882 Allergy status to sulfonamides status: Secondary | ICD-10-CM | POA: Insufficient documentation

## 2012-07-02 DIAGNOSIS — J4489 Other specified chronic obstructive pulmonary disease: Secondary | ICD-10-CM | POA: Insufficient documentation

## 2012-07-02 DIAGNOSIS — M129 Arthropathy, unspecified: Secondary | ICD-10-CM | POA: Insufficient documentation

## 2012-07-02 DIAGNOSIS — Z86711 Personal history of pulmonary embolism: Secondary | ICD-10-CM | POA: Insufficient documentation

## 2012-07-02 DIAGNOSIS — K219 Gastro-esophageal reflux disease without esophagitis: Secondary | ICD-10-CM | POA: Diagnosis not present

## 2012-07-02 DIAGNOSIS — Z91038 Other insect allergy status: Secondary | ICD-10-CM | POA: Insufficient documentation

## 2012-07-02 DIAGNOSIS — F17200 Nicotine dependence, unspecified, uncomplicated: Secondary | ICD-10-CM | POA: Insufficient documentation

## 2012-07-02 DIAGNOSIS — I10 Essential (primary) hypertension: Secondary | ICD-10-CM | POA: Diagnosis not present

## 2012-07-02 DIAGNOSIS — E042 Nontoxic multinodular goiter: Secondary | ICD-10-CM | POA: Insufficient documentation

## 2012-07-02 DIAGNOSIS — Z888 Allergy status to other drugs, medicaments and biological substances status: Secondary | ICD-10-CM | POA: Insufficient documentation

## 2012-07-02 HISTORY — PX: INSERTION OF DIALYSIS CATHETER: SHX1324

## 2012-07-02 LAB — POCT I-STAT 4, (NA,K, GLUC, HGB,HCT)
Glucose, Bld: 87 mg/dL (ref 70–99)
HCT: 32 % — ABNORMAL LOW (ref 36.0–46.0)
Hemoglobin: 10.9 g/dL — ABNORMAL LOW (ref 12.0–15.0)
Sodium: 137 mEq/L (ref 135–145)

## 2012-07-02 SURGERY — INSERTION OF DIALYSIS CATHETER
Anesthesia: Monitor Anesthesia Care | Site: Neck | Laterality: Right | Wound class: Clean

## 2012-07-02 MED ORDER — FENTANYL CITRATE 0.05 MG/ML IJ SOLN
INTRAMUSCULAR | Status: DC | PRN
  Administered 2012-07-02 (×2): 50 ug via INTRAVENOUS

## 2012-07-02 MED ORDER — OXYCODONE HCL 5 MG/5ML PO SOLN
5.0000 mg | Freq: Once | ORAL | Status: DC | PRN
Start: 2012-07-02 — End: 2012-07-02

## 2012-07-02 MED ORDER — ACETAMINOPHEN-CODEINE #3 300-30 MG PO TABS: 1.0000 | ORAL_TABLET | ORAL | Status: DC | PRN

## 2012-07-02 MED ORDER — 0.9 % SODIUM CHLORIDE (POUR BTL) OPTIME
TOPICAL | Status: DC | PRN
  Administered 2012-07-02: 1000 mL

## 2012-07-02 MED ORDER — PROPOFOL INFUSION 10 MG/ML OPTIME
INTRAVENOUS | Status: DC | PRN
  Administered 2012-07-02: 50 ug/kg/min via INTRAVENOUS

## 2012-07-02 MED ORDER — SODIUM CHLORIDE 0.9 % IV SOLN
INTRAVENOUS | Status: DC
  Administered 2012-07-02 (×2): via INTRAVENOUS

## 2012-07-02 MED ORDER — HEPARIN SODIUM (PORCINE) 1000 UNIT/ML IJ SOLN
INTRAMUSCULAR | Status: DC | PRN
  Administered 2012-07-02: 1000 [IU]

## 2012-07-02 MED ORDER — LIDOCAINE HCL (PF) 1 % IJ SOLN
INTRAMUSCULAR | Status: AC
  Filled 2012-07-02: qty 30

## 2012-07-02 MED ORDER — SODIUM CHLORIDE 0.9 % IR SOLN
Status: DC | PRN
  Administered 2012-07-02: 12:00:00

## 2012-07-02 MED ORDER — LIDOCAINE HCL (PF) 1 % IJ SOLN
INTRAMUSCULAR | Status: DC | PRN
  Administered 2012-07-02: 30 mL

## 2012-07-02 MED ORDER — OXYCODONE HCL 5 MG PO TABS: 5.0000 mg | ORAL_TABLET | Freq: Once | ORAL | Status: DC | PRN

## 2012-07-02 MED ORDER — HEPARIN SODIUM (PORCINE) 1000 UNIT/ML IJ SOLN
INTRAMUSCULAR | Status: AC
  Filled 2012-07-02: qty 1

## 2012-07-02 MED ORDER — ONDANSETRON HCL 4 MG/2ML IJ SOLN: 4.0000 mg | Freq: Once | INTRAMUSCULAR | Status: DC | PRN

## 2012-07-02 MED ORDER — MIDAZOLAM HCL 5 MG/5ML IJ SOLN
INTRAMUSCULAR | Status: DC | PRN
  Administered 2012-07-02 (×2): 1 mg via INTRAVENOUS

## 2012-07-02 MED ORDER — HYDROMORPHONE HCL PF 1 MG/ML IJ SOLN: 0.2500 mg | INTRAMUSCULAR | Status: DC | PRN

## 2012-07-02 MED ORDER — MEPERIDINE HCL 25 MG/ML IJ SOLN: 6.2500 mg | INTRAMUSCULAR | Status: DC | PRN

## 2012-07-02 MED ORDER — PROPOFOL 10 MG/ML IV BOLUS: INTRAVENOUS | Status: DC | PRN

## 2012-07-02 SURGICAL SUPPLY — 45 items
ADH SKN CLS APL DERMABOND .7 (GAUZE/BANDAGES/DRESSINGS) ×1
BAG DECANTER FOR FLEXI CONT (MISCELLANEOUS) ×2 IMPLANT
CATH CANNON HEMO 15F 50CM (CATHETERS) IMPLANT
CATH CANNON HEMO 15FR 19 (HEMODIALYSIS SUPPLIES) ×2 IMPLANT
CATH CANNON HEMO 15FR 23CM (HEMODIALYSIS SUPPLIES) IMPLANT
CATH CANNON HEMO 15FR 31CM (HEMODIALYSIS SUPPLIES) IMPLANT
CATH CANNON HEMO 15FR 32CM (HEMODIALYSIS SUPPLIES) IMPLANT
CHLORAPREP W/TINT 26ML (MISCELLANEOUS) ×2 IMPLANT
CLOTH BEACON ORANGE TIMEOUT ST (SAFETY) ×2 IMPLANT
COVER PROBE W GEL 5X96 (DRAPES) IMPLANT
COVER SURGICAL LIGHT HANDLE (MISCELLANEOUS) ×2 IMPLANT
DECANTER SPIKE VIAL GLASS SM (MISCELLANEOUS) ×2 IMPLANT
DERMABOND ADVANCED (GAUZE/BANDAGES/DRESSINGS) ×1
DERMABOND ADVANCED .7 DNX12 (GAUZE/BANDAGES/DRESSINGS) ×1 IMPLANT
DRAPE C-ARM 42X72 X-RAY (DRAPES) ×2 IMPLANT
DRAPE CHEST BREAST 15X10 FENES (DRAPES) ×2 IMPLANT
GAUZE SPONGE 2X2 8PLY STRL LF (GAUZE/BANDAGES/DRESSINGS) ×1 IMPLANT
GAUZE SPONGE 4X4 16PLY XRAY LF (GAUZE/BANDAGES/DRESSINGS) ×2 IMPLANT
GLOVE BIO SURGEON STRL SZ7 (GLOVE) ×2 IMPLANT
GLOVE BIO SURGEON STRL SZ7.5 (GLOVE) ×4 IMPLANT
GLOVE BIOGEL PI IND STRL 7.0 (GLOVE) ×2 IMPLANT
GLOVE BIOGEL PI INDICATOR 7.0 (GLOVE) ×2
GLOVE SURG SS PI 7.5 STRL IVOR (GLOVE) ×2 IMPLANT
GOWN PREVENTION PLUS XLARGE (GOWN DISPOSABLE) ×4 IMPLANT
GOWN PREVENTION PLUS XXLARGE (GOWN DISPOSABLE) ×2 IMPLANT
GOWN STRL NON-REIN LRG LVL3 (GOWN DISPOSABLE) ×2 IMPLANT
KIT BASIN OR (CUSTOM PROCEDURE TRAY) ×2 IMPLANT
KIT ROOM TURNOVER OR (KITS) ×2 IMPLANT
NEEDLE 18GX1X1/2 (RX/OR ONLY) (NEEDLE) ×2 IMPLANT
NEEDLE HYPO 25GX1X1/2 BEV (NEEDLE) ×2 IMPLANT
NS IRRIG 1000ML POUR BTL (IV SOLUTION) ×2 IMPLANT
PACK SURGICAL SETUP 50X90 (CUSTOM PROCEDURE TRAY) ×2 IMPLANT
PAD ARMBOARD 7.5X6 YLW CONV (MISCELLANEOUS) ×4 IMPLANT
SPONGE GAUZE 2X2 STER 10/PKG (GAUZE/BANDAGES/DRESSINGS) ×1
SUT ETHILON 3 0 PS 1 (SUTURE) ×2 IMPLANT
SUT VICRYL 4-0 PS2 18IN ABS (SUTURE) ×2 IMPLANT
SYR 20CC LL (SYRINGE) ×4 IMPLANT
SYR 30ML LL (SYRINGE) IMPLANT
SYR 5ML LL (SYRINGE) ×4 IMPLANT
SYR CONTROL 10ML LL (SYRINGE) ×2 IMPLANT
SYRINGE 10CC LL (SYRINGE) ×2 IMPLANT
TAPE CLOTH SURG 4X10 WHT LF (GAUZE/BANDAGES/DRESSINGS) ×2 IMPLANT
TOWEL OR 17X24 6PK STRL BLUE (TOWEL DISPOSABLE) ×2 IMPLANT
TOWEL OR 17X26 10 PK STRL BLUE (TOWEL DISPOSABLE) ×2 IMPLANT
WATER STERILE IRR 1000ML POUR (IV SOLUTION) ×2 IMPLANT

## 2012-07-02 NOTE — Op Note (Signed)
Procedure: Ultrasound-guided insertion of Diatek catheter, right side  Preoperative diagnosis: End-stage renal disease  Postoperative diagnosis: Same  Anesthesia: Local with IV sedation  Operative findings: 19 cm Diatek catheter right internal jugular vein  Operative details: After obtaining informed consent, the patient was taken to the operating room. The patient was placed in supine position on the operating room table. After adequate sedation the patient's entire neck and chest were prepped and draped in usual sterile fashion. The patient was placed in Trendelenburg position. Ultrasound was used to identify the patient's right internal jugular vein. This had normal compressibility and respiratory variation. Local anesthesia was infiltrated over the right jugular vein.  Using ultrasound guidance, the right internal jugular vein was successfully cannulated.  A 0.035 J-tipped guidewire was threaded into the right internal jugular vein and into the superior vena cava followed by the inferior vena cava under fluoroscopic guidance.   Next sequential 12 and 14 dilators were placed over the guidewire into the right atrium.  A 16 French dilator with a peel-away sheath was then placed over the guidewire into the right atrium.   The guidewire and dilator were removed. A 19 cm Diatek catheter was then placed through the peel away sheath into the right atrium.  The catheter was then tunneled subcutaneously, cut to length, and the hub attached. The catheter was noted to flush and draw easily. The catheter was inspected under fluoroscopy and found with its tip to be in the right atrium without any kinks throughout its course. The catheter was sutured to the skin with nylon sutures. The neck insertion site was closed with Vicryl stitch. The catheter was then loaded with concentrated Heparin solution. A dry sterile dressing was applied.  The patient tolerated procedure well and there were no complications. Instrument  sponge and needle counts correct in the case. The patient was taken to the recovery room in stable condition. Chest x-ray will be obtained in the recovery room.  Fabienne Bruns, MD [facility] Office: [phone] Pager: [phone]

## 2012-07-02 NOTE — Anesthesia Procedure Notes (Signed)
Procedure Name: MAC Date/Time: 07/02/2012 11:15 AM Performed by: Ellin Goodie Pre-anesthesia Checklist: Patient identified, Emergency Drugs available, Suction available, Patient being monitored and Timeout performed Patient Re-evaluated:Patient Re-evaluated prior to inductionOxygen Delivery Method: Simple face mask Preoxygenation: Pre-oxygenation with 100% oxygen Intubation Type: IV induction Dental Injury: Teeth and Oropharynx as per pre-operative assessment

## 2012-07-02 NOTE — Progress Notes (Signed)
Received report from Jamie RN.

## 2012-07-02 NOTE — Transfer of Care (Signed)
Immediate Anesthesia Transfer of Care Note  Patient: Lydia Clark  Procedure(s) Performed: Procedure(s) with comments: INSERTION OF DIALYSIS CATHETER (Right) - Right Internal Jugular  Patient Location: PACU  Anesthesia Type:General  Level of Consciousness: awake, alert  and oriented  Airway & Oxygen Therapy: Patient connected to face mask oxygen  Post-op Assessment: Report given to PACU RN  Post vital signs: stable  Complications: No apparent anesthesia complications

## 2012-07-02 NOTE — Preoperative (Signed)
Beta Blockers   Reason not to administer Beta Blockers:Not Applicable 

## 2012-07-02 NOTE — Anesthesia Preprocedure Evaluation (Addendum)
Anesthesia Evaluation  Patient identified by MRN, date of birth, ID band Patient awake    Reviewed: Allergy & Precautions, H&P , NPO status , Patient's Chart, lab work & pertinent test results  Airway Mallampati: I TM Distance: >3 FB Neck ROM: Full    Dental   Pulmonary          Cardiovascular hypertension, Pt. on medications     Neuro/Psych    GI/Hepatic GERD-  Medicated and Controlled,  Endo/Other    Renal/GU Renal disease     Musculoskeletal   Abdominal   Peds  Hematology   Anesthesia Other Findings   Reproductive/Obstetrics                          Anesthesia Physical Anesthesia Plan  ASA: III  Anesthesia Plan: MAC   Post-op Pain Management:    Induction: Intravenous  Airway Management Planned: Natural Airway  Additional Equipment:   Intra-op Plan:   Post-operative Plan:   Informed Consent: I have reviewed the patients History and Physical, chart, labs and discussed the procedure including the risks, benefits and alternatives for the proposed anesthesia with the patient or authorized representative who has indicated his/her understanding and acceptance.     Plan Discussed with: CRNA and Surgeon  Anesthesia Plan Comments:         Anesthesia Quick Evaluation

## 2012-07-02 NOTE — Anesthesia Postprocedure Evaluation (Signed)
Anesthesia Post Note  Patient: Lydia Clark  Procedure(s) Performed: Procedure(s) (LRB): INSERTION OF DIALYSIS CATHETER (Right)  Anesthesia type: general  Patient location: PACU  Post pain: Pain level controlled  Post assessment: Patient's Cardiovascular Status Stable  Last Vitals:  Filed Vitals:   07/02/12 1231  BP: 138/65  Pulse: 71  Temp:   Resp: 14    Post vital signs: Reviewed and stable  Level of consciousness: sedated  Complications: No apparent anesthesia complications

## 2012-07-02 NOTE — H&P (Signed)
[facility] H&P  CC:  Needs dialysis access  HPI: Pt with renal failure secondary to " scar tissue".  Not yet on dialysis.  No prior central lines.  No prior access procedures.  Past Medical History  Diagnosis Date  . Allergy   . Cancer 2008    left breast  . Hypertension 2006  . Personal history of tobacco use, presenting hazards to health 2012    30 yrs smoking  . Endocrine disorder 2008    thyroid  . Personal history of malignant neoplasm of breast 2008  . Breast screening, unspecified 2013  . Special screening for malignant neoplasms, colon 2013  . COPD (chronic obstructive pulmonary disease)   . PE (pulmonary embolism)     patient denies  . Swelling of throat     swelling at base of throat,  . Anxiety     anxiety  . Pneumonia     hx  . Heart murmur     child  . History of kidney stones   . GERD (gastroesophageal reflux disease)   . Arthritis   . Chronic kidney disease     stage IV chronic   . Multinodular goiter     followed by Dr. Bud Face @ [facility]    FH:  Non-Contributory  Social HX History  Substance Use Topics  . Smoking status: Current Every Day Smoker -- 1.00 packs/day for 30 years    Types: Cigarettes  . Smokeless tobacco: Not on file  . Alcohol Use: No    Allergies Allergies  Allergen Reactions  . Bee Venom Swelling and Other (See Comments)    Breathing problems  . Biaxin (Clarithromycin) Other (See Comments)    Throat swells  . Mycostatin (Nystatin) Other (See Comments)    Blisters from the ointment  . Sulfa Antibiotics Other (See Comments)    "welps"    Medications Current Facility-Administered Medications  Medication Dose Route Frequency Provider Last Rate Last Dose  . 0.9 %  sodium chloride infusion   Intravenous Continuous Sherren Kerns, MD 20 mL/hr at 07/02/12 [phone]    . cefUROXime (ZINACEF) 1.5 g in dextrose 5 % 50 mL IVPB  1.5 g Intravenous 30 min Pre-Op Sherren Kerns, MD      .  HYDROmorphone (DILAUDID) injection 0.25-0.5 mg  0.25-0.5 mg Intravenous Q5 min PRN Aubery Lapping, MD      . meperidine (DEMEROL) injection 6.25-12.5 mg  6.25-12.5 mg Intravenous Q5 min PRN Aubery Lapping, MD      . ondansetron [facility]) injection 4 mg  4 mg Intravenous Once PRN Aubery Lapping, MD      . oxyCODONE (Oxy IR/ROXICODONE) immediate release tablet 5 mg  5 mg Oral Once PRN Aubery Lapping, MD       Or  . oxyCODONE (ROXICODONE) 5 MG/5ML solution 5 mg  5 mg Oral Once PRN Aubery Lapping, MD        PHYSICAL EXAM  Filed Vitals:   07/02/12 0732  BP: 149/80  Pulse: 69  Temp: 97.8 F (36.6 C)  Resp: 16    General:  WDWN in NAD HENT: WNL Eyes: Pupils equal, left conjunctiva injected Pulmonary: normal non-labored breathing , without Rales, rhonchi,  wheezing Cardiac: RRR Vascular Exam/Pulses:  2+ radial pulses bilat Extremities without ischemic changes, no Gangrene , no cellulitis; no open wounds;  Neuro A&O x 3; good sensation; motion in all extremities  Impression: Needs dialysis access  Plan:  Diatek catheter today. Procedure risks benefits explained to patient.  She wishes to proceed.  FIELDS,CHARLES E @TODAY @ 11:03 AM

## 2012-07-03 ENCOUNTER — Encounter (HOSPITAL_COMMUNITY): Payer: Self-pay | Admitting: Vascular Surgery

## 2012-07-03 DIAGNOSIS — N186 End stage renal disease: Secondary | ICD-10-CM | POA: Diagnosis not present

## 2012-07-03 DIAGNOSIS — D631 Anemia in chronic kidney disease: Secondary | ICD-10-CM | POA: Diagnosis not present

## 2012-07-03 DIAGNOSIS — Z992 Dependence on renal dialysis: Secondary | ICD-10-CM | POA: Diagnosis not present

## 2012-07-05 DIAGNOSIS — Z23 Encounter for immunization: Secondary | ICD-10-CM | POA: Diagnosis not present

## 2012-07-05 DIAGNOSIS — N039 Chronic nephritic syndrome with unspecified morphologic changes: Secondary | ICD-10-CM | POA: Diagnosis not present

## 2012-07-05 DIAGNOSIS — N2581 Secondary hyperparathyroidism of renal origin: Secondary | ICD-10-CM | POA: Diagnosis not present

## 2012-07-05 DIAGNOSIS — N186 End stage renal disease: Secondary | ICD-10-CM | POA: Diagnosis not present

## 2012-07-05 DIAGNOSIS — Z992 Dependence on renal dialysis: Secondary | ICD-10-CM | POA: Diagnosis not present

## 2012-07-05 DIAGNOSIS — D509 Iron deficiency anemia, unspecified: Secondary | ICD-10-CM | POA: Diagnosis not present

## 2012-07-05 DIAGNOSIS — D631 Anemia in chronic kidney disease: Secondary | ICD-10-CM | POA: Diagnosis not present

## 2012-07-08 ENCOUNTER — Telehealth: Payer: Self-pay

## 2012-07-08 DIAGNOSIS — D509 Iron deficiency anemia, unspecified: Secondary | ICD-10-CM | POA: Diagnosis not present

## 2012-07-08 DIAGNOSIS — N2581 Secondary hyperparathyroidism of renal origin: Secondary | ICD-10-CM | POA: Diagnosis not present

## 2012-07-08 DIAGNOSIS — Z23 Encounter for immunization: Secondary | ICD-10-CM | POA: Diagnosis not present

## 2012-07-08 DIAGNOSIS — Z992 Dependence on renal dialysis: Secondary | ICD-10-CM | POA: Diagnosis not present

## 2012-07-08 DIAGNOSIS — N186 End stage renal disease: Secondary | ICD-10-CM | POA: Diagnosis not present

## 2012-07-08 DIAGNOSIS — D631 Anemia in chronic kidney disease: Secondary | ICD-10-CM | POA: Diagnosis not present

## 2012-07-08 NOTE — Telephone Encounter (Signed)
Sheena called today regarding this patient. She stated the patient went in for emergency shunt placement. The patient was scheduled for a mammogram for this Thursday. Sheena cancelled the appointment due to concerns with the new shunt. She would like for you to call her back so she could discuss this with you.

## 2012-07-10 DIAGNOSIS — N186 End stage renal disease: Secondary | ICD-10-CM | POA: Diagnosis not present

## 2012-07-10 DIAGNOSIS — N039 Chronic nephritic syndrome with unspecified morphologic changes: Secondary | ICD-10-CM | POA: Diagnosis not present

## 2012-07-10 DIAGNOSIS — D509 Iron deficiency anemia, unspecified: Secondary | ICD-10-CM | POA: Diagnosis not present

## 2012-07-10 DIAGNOSIS — N2581 Secondary hyperparathyroidism of renal origin: Secondary | ICD-10-CM | POA: Diagnosis not present

## 2012-07-10 DIAGNOSIS — Z23 Encounter for immunization: Secondary | ICD-10-CM | POA: Diagnosis not present

## 2012-07-10 DIAGNOSIS — Z992 Dependence on renal dialysis: Secondary | ICD-10-CM | POA: Diagnosis not present

## 2012-07-12 DIAGNOSIS — D509 Iron deficiency anemia, unspecified: Secondary | ICD-10-CM | POA: Diagnosis not present

## 2012-07-12 DIAGNOSIS — Z992 Dependence on renal dialysis: Secondary | ICD-10-CM | POA: Diagnosis not present

## 2012-07-12 DIAGNOSIS — Z23 Encounter for immunization: Secondary | ICD-10-CM | POA: Diagnosis not present

## 2012-07-12 DIAGNOSIS — N2581 Secondary hyperparathyroidism of renal origin: Secondary | ICD-10-CM | POA: Diagnosis not present

## 2012-07-12 DIAGNOSIS — N186 End stage renal disease: Secondary | ICD-10-CM | POA: Diagnosis not present

## 2012-07-12 DIAGNOSIS — D631 Anemia in chronic kidney disease: Secondary | ICD-10-CM | POA: Diagnosis not present

## 2012-07-15 DIAGNOSIS — D631 Anemia in chronic kidney disease: Secondary | ICD-10-CM | POA: Diagnosis not present

## 2012-07-15 DIAGNOSIS — N2581 Secondary hyperparathyroidism of renal origin: Secondary | ICD-10-CM | POA: Diagnosis not present

## 2012-07-15 DIAGNOSIS — Z23 Encounter for immunization: Secondary | ICD-10-CM | POA: Diagnosis not present

## 2012-07-15 DIAGNOSIS — D509 Iron deficiency anemia, unspecified: Secondary | ICD-10-CM | POA: Diagnosis not present

## 2012-07-15 DIAGNOSIS — N186 End stage renal disease: Secondary | ICD-10-CM | POA: Diagnosis not present

## 2012-07-15 DIAGNOSIS — Z992 Dependence on renal dialysis: Secondary | ICD-10-CM | POA: Diagnosis not present

## 2012-07-17 ENCOUNTER — Telehealth: Payer: Self-pay | Admitting: *Deleted

## 2012-07-17 DIAGNOSIS — N186 End stage renal disease: Secondary | ICD-10-CM | POA: Diagnosis not present

## 2012-07-17 DIAGNOSIS — N2581 Secondary hyperparathyroidism of renal origin: Secondary | ICD-10-CM | POA: Diagnosis not present

## 2012-07-17 DIAGNOSIS — N039 Chronic nephritic syndrome with unspecified morphologic changes: Secondary | ICD-10-CM | POA: Diagnosis not present

## 2012-07-17 DIAGNOSIS — Z992 Dependence on renal dialysis: Secondary | ICD-10-CM | POA: Diagnosis not present

## 2012-07-17 DIAGNOSIS — Z23 Encounter for immunization: Secondary | ICD-10-CM | POA: Diagnosis not present

## 2012-07-17 DIAGNOSIS — D509 Iron deficiency anemia, unspecified: Secondary | ICD-10-CM | POA: Diagnosis not present

## 2012-07-17 NOTE — Telephone Encounter (Signed)
Per Zenon Mayo at [facility], Patient did not want to get Mammogram completed due to shunt in breast? She wanted to let you know, so you were aware.

## 2012-07-18 ENCOUNTER — Telehealth: Payer: Self-pay | Admitting: *Deleted

## 2012-07-18 NOTE — Telephone Encounter (Signed)
Message copied by Nicholes Mango on Thu Jul 18, 2012  3:59 PM ------      Message from: Kieth Brightly      Created: Thu Jul 18, 2012  2:54 PM       Pt did not want to have mammogram because of a shunt placed in chest area. We can delay this but I would like to see her for her cancer f/u. ------

## 2012-07-18 NOTE — Telephone Encounter (Signed)
Message has been left for patient to call the office.

## 2012-07-18 NOTE — Telephone Encounter (Signed)
Patient called back and notified as instructed. She is to follow up in the office on 07-23-12.

## 2012-07-19 DIAGNOSIS — D631 Anemia in chronic kidney disease: Secondary | ICD-10-CM | POA: Diagnosis not present

## 2012-07-19 DIAGNOSIS — D509 Iron deficiency anemia, unspecified: Secondary | ICD-10-CM | POA: Diagnosis not present

## 2012-07-19 DIAGNOSIS — Z992 Dependence on renal dialysis: Secondary | ICD-10-CM | POA: Diagnosis not present

## 2012-07-19 DIAGNOSIS — Z23 Encounter for immunization: Secondary | ICD-10-CM | POA: Diagnosis not present

## 2012-07-19 DIAGNOSIS — N186 End stage renal disease: Secondary | ICD-10-CM | POA: Diagnosis not present

## 2012-07-19 DIAGNOSIS — N2581 Secondary hyperparathyroidism of renal origin: Secondary | ICD-10-CM | POA: Diagnosis not present

## 2012-07-22 DIAGNOSIS — D631 Anemia in chronic kidney disease: Secondary | ICD-10-CM | POA: Diagnosis not present

## 2012-07-22 DIAGNOSIS — N2581 Secondary hyperparathyroidism of renal origin: Secondary | ICD-10-CM | POA: Diagnosis not present

## 2012-07-22 DIAGNOSIS — N186 End stage renal disease: Secondary | ICD-10-CM | POA: Diagnosis not present

## 2012-07-22 DIAGNOSIS — D509 Iron deficiency anemia, unspecified: Secondary | ICD-10-CM | POA: Diagnosis not present

## 2012-07-22 DIAGNOSIS — Z992 Dependence on renal dialysis: Secondary | ICD-10-CM | POA: Diagnosis not present

## 2012-07-22 DIAGNOSIS — Z23 Encounter for immunization: Secondary | ICD-10-CM | POA: Diagnosis not present

## 2012-07-23 ENCOUNTER — Other Ambulatory Visit: Payer: Self-pay | Admitting: *Deleted

## 2012-07-23 ENCOUNTER — Ambulatory Visit: Payer: Self-pay | Admitting: General Surgery

## 2012-07-23 ENCOUNTER — Ambulatory Visit (INDEPENDENT_AMBULATORY_CARE_PROVIDER_SITE_OTHER): Payer: Medicare Other | Admitting: General Surgery

## 2012-07-23 ENCOUNTER — Encounter: Payer: Self-pay | Admitting: General Surgery

## 2012-07-23 VITALS — BP 156/74 | HR 78 | Resp 14 | Ht 67.0 in | Wt 129.0 lb

## 2012-07-23 DIAGNOSIS — Z853 Personal history of malignant neoplasm of breast: Secondary | ICD-10-CM

## 2012-07-23 NOTE — Progress Notes (Signed)
Patient ID: Lydia Clark, female   DOB: 11-16-1940, 72 y.o.   MRN: [id number]  Chief Complaint  Patient presents with  . Breast Cancer Long Term Follow Up    HPI Lydia Clark is a 72 y.o. female. Patient here today for follow up breast cancer. She had a left breast lumpectomy in 2008 with radiation. No recent mammogram has been done due to the recent HD catheter placement.  Denies any new breast issues.  Patient has a temporary dialysis catheter right chest and goes to HD on Mon-Wed-Fri ([facility] in [place]), tolerating well. Has an appointment with Lydia Clark for evaluation of a PD catheter on August 06 2012.  HPI  Past Medical History  Diagnosis Date  . Allergy   . Cancer 2008    left breast  . Hypertension 2006  . Personal history of tobacco use, presenting hazards to health 2012    30 yrs smoking  . Endocrine disorder 2008    thyroid  . Personal history of malignant neoplasm of breast 2008  . Breast screening, unspecified 2013  . Special screening for malignant neoplasms, colon 2013  . COPD (chronic obstructive pulmonary disease)   . PE (pulmonary embolism)     patient denies  . Swelling of throat     swelling at base of throat,  . Anxiety     anxiety  . Pneumonia     hx  . Heart murmur     child  . History of kidney stones   . GERD (gastroesophageal reflux disease)   . Arthritis   . Multinodular goiter     followed by Lydia Clark @ [facility]  . Dialysis patient 2014  . Chronic kidney disease 2014    stage IV chronic     Past Surgical History  Procedure Laterality Date  . Breast surgery Left 2008    lumpectomy  . Fooy Left     lft foot  . Foot surgery  2005  . 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 Tonsillectomy  2005  . Breast biopsy  2005  . Appendectomy  2005  . Replacement total knee Left 2012  . Colonoscopy  2007    done in [place]  . Insertion of dialysis catheter Right 07/02/2012    Procedure: INSERTION OF DIALYSIS  CATHETER;  Surgeon: Lydia Kerns, MD;  Location: [facility] OR;  Service: Vascular;  Laterality: Right;  Right Internal Jugular  . Insertion of dialysis catheter Right July 02 2012    right chest/ temporary cath    Family History  Problem Relation Age of Onset  . Cancer Other     breast cancer    Social History History  Substance Use Topics  . Smoking status: Current Every Day Smoker -- 1.00 packs/day for 30 years    Types: Cigarettes  . Smokeless tobacco: Not on file  . Alcohol Use: No    Allergies  Allergen Reactions  . Bee Venom Swelling and Other (See Comments)    Breathing problems  . Biaxin (Clarithromycin) Other (See Comments)    Throat swells  . Mycostatin (Nystatin) Other (See Comments)    Blisters from the ointment  . Sulfa Antibiotics Other (See Comments)    "welps"    Current Outpatient Prescriptions  Medication Sig Dispense Refill  . aspirin 81 MG tablet Take 81 mg by mouth daily.      . Cholecalciferol (VITAMIN D3) 2000 UNITS TABS Take 1  tablet by mouth daily.      Marland Kitchen LORazepam (ATIVAN) 1 MG tablet Take 1 mg by mouth at bedtime as needed for anxiety.       Marland Kitchen omeprazole (PRILOSEC) 20 MG capsule Take 20 mg by mouth daily.      . rosuvastatin (CRESTOR) 10 MG tablet Take 10 mg by mouth daily.      Marland Kitchen tiotropium (SPIRIVA) 18 MCG inhalation capsule Place 18 mcg into inhaler and inhale daily.       No current facility-administered medications for this visit.    Review of Systems Review of Systems  Constitutional: Negative.   Respiratory: Positive for shortness of breath.   Cardiovascular: Negative.     Blood pressure 156/74, pulse 78, resp. rate 14, height 5\' 7"  (1.702 m), weight 129 lb (58.514 kg).  Physical Exam Physical Exam  Constitutional: She is oriented to person, place, and time. She appears well-developed.  Eyes: Conjunctivae are normal. Pupils are equal, round, and reactive to light. No scleral icterus.  Neck: No mass and no thyromegaly present.   Cardiovascular: Normal rate, regular rhythm and normal heart sounds.   Pulses:      Dorsalis pedis pulses are 2+ on the right side, and 2+ on the left side.       Posterior tibial pulses are 2+ on the right side, and 2+ on the left side.  Bilateral mild/moderate edema lower legs  Pulmonary/Chest: Effort normal and breath sounds normal. Right breast exhibits no inverted nipple, no mass, no nipple discharge, no skin change and no tenderness. Left breast exhibits no inverted nipple, no mass, no nipple discharge, no skin change and no tenderness.  Abdominal: Soft. Bowel sounds are normal. There is no hepatosplenomegaly. There is no tenderness.  Lymphadenopathy:    She has no cervical adenopathy.    She has no axillary adenopathy.  Neurological: She is alert and oriented to person, place, and time.  Skin: Skin is warm and dry.    Data Reviewed Mammogram was not done to recent HD catheter placement.  Assessment    stable    Plan    Follow up mammogram in 1 year       Lydia Clark 07/24/2012, 12:29 PM

## 2012-07-23 NOTE — Progress Notes (Signed)
The patient has been asked to return to the office in one year for a bilateral diagnostic mammogram. 

## 2012-07-23 NOTE — Patient Instructions (Addendum)
Continue self breast exams. Call office for any new breast issues or concerns. 

## 2012-07-24 ENCOUNTER — Encounter: Payer: Self-pay | Admitting: General Surgery

## 2012-07-24 DIAGNOSIS — N2581 Secondary hyperparathyroidism of renal origin: Secondary | ICD-10-CM | POA: Diagnosis not present

## 2012-07-24 DIAGNOSIS — Z992 Dependence on renal dialysis: Secondary | ICD-10-CM | POA: Diagnosis not present

## 2012-07-24 DIAGNOSIS — D631 Anemia in chronic kidney disease: Secondary | ICD-10-CM | POA: Diagnosis not present

## 2012-07-24 DIAGNOSIS — N186 End stage renal disease: Secondary | ICD-10-CM | POA: Diagnosis not present

## 2012-07-24 DIAGNOSIS — D509 Iron deficiency anemia, unspecified: Secondary | ICD-10-CM | POA: Diagnosis not present

## 2012-07-24 DIAGNOSIS — Z23 Encounter for immunization: Secondary | ICD-10-CM | POA: Diagnosis not present

## 2012-07-26 DIAGNOSIS — Z992 Dependence on renal dialysis: Secondary | ICD-10-CM | POA: Diagnosis not present

## 2012-07-26 DIAGNOSIS — N2581 Secondary hyperparathyroidism of renal origin: Secondary | ICD-10-CM | POA: Diagnosis not present

## 2012-07-26 DIAGNOSIS — Z23 Encounter for immunization: Secondary | ICD-10-CM | POA: Diagnosis not present

## 2012-07-26 DIAGNOSIS — D509 Iron deficiency anemia, unspecified: Secondary | ICD-10-CM | POA: Diagnosis not present

## 2012-07-26 DIAGNOSIS — N186 End stage renal disease: Secondary | ICD-10-CM | POA: Diagnosis not present

## 2012-07-26 DIAGNOSIS — D631 Anemia in chronic kidney disease: Secondary | ICD-10-CM | POA: Diagnosis not present

## 2012-07-26 DIAGNOSIS — N039 Chronic nephritic syndrome with unspecified morphologic changes: Secondary | ICD-10-CM | POA: Diagnosis not present

## 2012-07-29 DIAGNOSIS — D631 Anemia in chronic kidney disease: Secondary | ICD-10-CM | POA: Diagnosis not present

## 2012-07-29 DIAGNOSIS — N2581 Secondary hyperparathyroidism of renal origin: Secondary | ICD-10-CM | POA: Diagnosis not present

## 2012-07-29 DIAGNOSIS — N186 End stage renal disease: Secondary | ICD-10-CM | POA: Diagnosis not present

## 2012-07-29 DIAGNOSIS — D509 Iron deficiency anemia, unspecified: Secondary | ICD-10-CM | POA: Diagnosis not present

## 2012-07-29 DIAGNOSIS — Z23 Encounter for immunization: Secondary | ICD-10-CM | POA: Diagnosis not present

## 2012-07-29 DIAGNOSIS — N039 Chronic nephritic syndrome with unspecified morphologic changes: Secondary | ICD-10-CM | POA: Diagnosis not present

## 2012-07-29 DIAGNOSIS — Z992 Dependence on renal dialysis: Secondary | ICD-10-CM | POA: Diagnosis not present

## 2012-07-31 DIAGNOSIS — N186 End stage renal disease: Secondary | ICD-10-CM | POA: Diagnosis not present

## 2012-07-31 DIAGNOSIS — D631 Anemia in chronic kidney disease: Secondary | ICD-10-CM | POA: Diagnosis not present

## 2012-07-31 DIAGNOSIS — Z992 Dependence on renal dialysis: Secondary | ICD-10-CM | POA: Diagnosis not present

## 2012-07-31 DIAGNOSIS — N2581 Secondary hyperparathyroidism of renal origin: Secondary | ICD-10-CM | POA: Diagnosis not present

## 2012-07-31 DIAGNOSIS — D509 Iron deficiency anemia, unspecified: Secondary | ICD-10-CM | POA: Diagnosis not present

## 2012-07-31 DIAGNOSIS — Z23 Encounter for immunization: Secondary | ICD-10-CM | POA: Diagnosis not present

## 2012-08-02 DIAGNOSIS — Z23 Encounter for immunization: Secondary | ICD-10-CM | POA: Diagnosis not present

## 2012-08-02 DIAGNOSIS — Z992 Dependence on renal dialysis: Secondary | ICD-10-CM | POA: Diagnosis not present

## 2012-08-02 DIAGNOSIS — D509 Iron deficiency anemia, unspecified: Secondary | ICD-10-CM | POA: Diagnosis not present

## 2012-08-02 DIAGNOSIS — N2581 Secondary hyperparathyroidism of renal origin: Secondary | ICD-10-CM | POA: Diagnosis not present

## 2012-08-02 DIAGNOSIS — N186 End stage renal disease: Secondary | ICD-10-CM | POA: Diagnosis not present

## 2012-08-02 DIAGNOSIS — D631 Anemia in chronic kidney disease: Secondary | ICD-10-CM | POA: Diagnosis not present

## 2012-08-03 DIAGNOSIS — N186 End stage renal disease: Secondary | ICD-10-CM | POA: Diagnosis not present

## 2012-08-05 DIAGNOSIS — N186 End stage renal disease: Secondary | ICD-10-CM | POA: Diagnosis not present

## 2012-08-05 DIAGNOSIS — D509 Iron deficiency anemia, unspecified: Secondary | ICD-10-CM | POA: Diagnosis not present

## 2012-08-05 DIAGNOSIS — N2581 Secondary hyperparathyroidism of renal origin: Secondary | ICD-10-CM | POA: Diagnosis not present

## 2012-08-05 DIAGNOSIS — Z992 Dependence on renal dialysis: Secondary | ICD-10-CM | POA: Diagnosis not present

## 2012-08-05 DIAGNOSIS — Z23 Encounter for immunization: Secondary | ICD-10-CM | POA: Diagnosis not present

## 2012-08-05 DIAGNOSIS — D631 Anemia in chronic kidney disease: Secondary | ICD-10-CM | POA: Diagnosis not present

## 2012-08-05 DIAGNOSIS — N039 Chronic nephritic syndrome with unspecified morphologic changes: Secondary | ICD-10-CM | POA: Diagnosis not present

## 2012-08-06 DIAGNOSIS — I10 Essential (primary) hypertension: Secondary | ICD-10-CM | POA: Diagnosis not present

## 2012-08-06 DIAGNOSIS — J449 Chronic obstructive pulmonary disease, unspecified: Secondary | ICD-10-CM | POA: Diagnosis not present

## 2012-08-06 DIAGNOSIS — N186 End stage renal disease: Secondary | ICD-10-CM | POA: Diagnosis not present

## 2012-08-06 DIAGNOSIS — E785 Hyperlipidemia, unspecified: Secondary | ICD-10-CM | POA: Diagnosis not present

## 2012-08-07 DIAGNOSIS — Z23 Encounter for immunization: Secondary | ICD-10-CM | POA: Diagnosis not present

## 2012-08-07 DIAGNOSIS — N2581 Secondary hyperparathyroidism of renal origin: Secondary | ICD-10-CM | POA: Diagnosis not present

## 2012-08-07 DIAGNOSIS — N186 End stage renal disease: Secondary | ICD-10-CM | POA: Diagnosis not present

## 2012-08-07 DIAGNOSIS — Z992 Dependence on renal dialysis: Secondary | ICD-10-CM | POA: Diagnosis not present

## 2012-08-07 DIAGNOSIS — D631 Anemia in chronic kidney disease: Secondary | ICD-10-CM | POA: Diagnosis not present

## 2012-08-07 DIAGNOSIS — D509 Iron deficiency anemia, unspecified: Secondary | ICD-10-CM | POA: Diagnosis not present

## 2012-08-08 ENCOUNTER — Ambulatory Visit: Payer: Self-pay | Admitting: Vascular Surgery

## 2012-08-08 DIAGNOSIS — I10 Essential (primary) hypertension: Secondary | ICD-10-CM

## 2012-08-08 DIAGNOSIS — F17200 Nicotine dependence, unspecified, uncomplicated: Secondary | ICD-10-CM | POA: Diagnosis not present

## 2012-08-08 DIAGNOSIS — I120 Hypertensive chronic kidney disease with stage 5 chronic kidney disease or end stage renal disease: Secondary | ICD-10-CM | POA: Diagnosis not present

## 2012-08-08 DIAGNOSIS — N186 End stage renal disease: Secondary | ICD-10-CM | POA: Diagnosis not present

## 2012-08-08 DIAGNOSIS — Z01812 Encounter for preprocedural laboratory examination: Secondary | ICD-10-CM | POA: Diagnosis not present

## 2012-08-08 DIAGNOSIS — Z992 Dependence on renal dialysis: Secondary | ICD-10-CM | POA: Diagnosis not present

## 2012-08-08 DIAGNOSIS — Z01810 Encounter for preprocedural cardiovascular examination: Secondary | ICD-10-CM | POA: Diagnosis not present

## 2012-08-08 LAB — BASIC METABOLIC PANEL
Chloride: 98 mmol/L (ref 98–107)
Co2: 26 mmol/L (ref 21–32)
Creatinine: 3.16 mg/dL — ABNORMAL HIGH (ref 0.60–1.30)
EGFR (African American): 17 — ABNORMAL LOW
EGFR (Non-African Amer.): 14 — ABNORMAL LOW
Glucose: 110 mg/dL — ABNORMAL HIGH (ref 65–99)
Potassium: 3.5 mmol/L (ref 3.5–5.1)

## 2012-08-08 LAB — CBC
HGB: 10.7 g/dL — ABNORMAL LOW (ref 12.0–16.0)
MCV: 94 fL (ref 80–100)
RDW: 12.7 % (ref 11.5–14.5)

## 2012-08-09 DIAGNOSIS — N186 End stage renal disease: Secondary | ICD-10-CM | POA: Diagnosis not present

## 2012-08-09 DIAGNOSIS — Z992 Dependence on renal dialysis: Secondary | ICD-10-CM | POA: Diagnosis not present

## 2012-08-09 DIAGNOSIS — N039 Chronic nephritic syndrome with unspecified morphologic changes: Secondary | ICD-10-CM | POA: Diagnosis not present

## 2012-08-09 DIAGNOSIS — D509 Iron deficiency anemia, unspecified: Secondary | ICD-10-CM | POA: Diagnosis not present

## 2012-08-09 DIAGNOSIS — N2581 Secondary hyperparathyroidism of renal origin: Secondary | ICD-10-CM | POA: Diagnosis not present

## 2012-08-09 DIAGNOSIS — Z23 Encounter for immunization: Secondary | ICD-10-CM | POA: Diagnosis not present

## 2012-08-12 DIAGNOSIS — D631 Anemia in chronic kidney disease: Secondary | ICD-10-CM | POA: Diagnosis not present

## 2012-08-12 DIAGNOSIS — Z23 Encounter for immunization: Secondary | ICD-10-CM | POA: Diagnosis not present

## 2012-08-12 DIAGNOSIS — D509 Iron deficiency anemia, unspecified: Secondary | ICD-10-CM | POA: Diagnosis not present

## 2012-08-12 DIAGNOSIS — N186 End stage renal disease: Secondary | ICD-10-CM | POA: Diagnosis not present

## 2012-08-12 DIAGNOSIS — N2581 Secondary hyperparathyroidism of renal origin: Secondary | ICD-10-CM | POA: Diagnosis not present

## 2012-08-12 DIAGNOSIS — Z992 Dependence on renal dialysis: Secondary | ICD-10-CM | POA: Diagnosis not present

## 2012-08-14 DIAGNOSIS — N186 End stage renal disease: Secondary | ICD-10-CM | POA: Diagnosis not present

## 2012-08-14 DIAGNOSIS — N2581 Secondary hyperparathyroidism of renal origin: Secondary | ICD-10-CM | POA: Diagnosis not present

## 2012-08-14 DIAGNOSIS — Z992 Dependence on renal dialysis: Secondary | ICD-10-CM | POA: Diagnosis not present

## 2012-08-14 DIAGNOSIS — Z23 Encounter for immunization: Secondary | ICD-10-CM | POA: Diagnosis not present

## 2012-08-14 DIAGNOSIS — D509 Iron deficiency anemia, unspecified: Secondary | ICD-10-CM | POA: Diagnosis not present

## 2012-08-14 DIAGNOSIS — N039 Chronic nephritic syndrome with unspecified morphologic changes: Secondary | ICD-10-CM | POA: Diagnosis not present

## 2012-08-15 ENCOUNTER — Ambulatory Visit: Payer: Self-pay | Admitting: Vascular Surgery

## 2012-08-15 DIAGNOSIS — R0609 Other forms of dyspnea: Secondary | ICD-10-CM | POA: Diagnosis not present

## 2012-08-15 DIAGNOSIS — K219 Gastro-esophageal reflux disease without esophagitis: Secondary | ICD-10-CM | POA: Diagnosis not present

## 2012-08-15 DIAGNOSIS — Z888 Allergy status to other drugs, medicaments and biological substances status: Secondary | ICD-10-CM | POA: Diagnosis not present

## 2012-08-15 DIAGNOSIS — R609 Edema, unspecified: Secondary | ICD-10-CM | POA: Diagnosis not present

## 2012-08-15 DIAGNOSIS — Z8249 Family history of ischemic heart disease and other diseases of the circulatory system: Secondary | ICD-10-CM | POA: Diagnosis not present

## 2012-08-15 DIAGNOSIS — D649 Anemia, unspecified: Secondary | ICD-10-CM | POA: Diagnosis not present

## 2012-08-15 DIAGNOSIS — Z7982 Long term (current) use of aspirin: Secondary | ICD-10-CM | POA: Diagnosis not present

## 2012-08-15 DIAGNOSIS — F17200 Nicotine dependence, unspecified, uncomplicated: Secondary | ICD-10-CM | POA: Diagnosis not present

## 2012-08-15 DIAGNOSIS — N186 End stage renal disease: Secondary | ICD-10-CM | POA: Diagnosis not present

## 2012-08-15 DIAGNOSIS — Z8489 Family history of other specified conditions: Secondary | ICD-10-CM | POA: Diagnosis not present

## 2012-08-15 DIAGNOSIS — I120 Hypertensive chronic kidney disease with stage 5 chronic kidney disease or end stage renal disease: Secondary | ICD-10-CM | POA: Diagnosis not present

## 2012-08-15 DIAGNOSIS — E785 Hyperlipidemia, unspecified: Secondary | ICD-10-CM | POA: Diagnosis not present

## 2012-08-15 DIAGNOSIS — Z882 Allergy status to sulfonamides status: Secondary | ICD-10-CM | POA: Diagnosis not present

## 2012-08-15 DIAGNOSIS — Z91038 Other insect allergy status: Secondary | ICD-10-CM | POA: Diagnosis not present

## 2012-08-15 DIAGNOSIS — Z79899 Other long term (current) drug therapy: Secondary | ICD-10-CM | POA: Diagnosis not present

## 2012-08-15 DIAGNOSIS — J449 Chronic obstructive pulmonary disease, unspecified: Secondary | ICD-10-CM | POA: Diagnosis not present

## 2012-08-15 DIAGNOSIS — Z992 Dependence on renal dialysis: Secondary | ICD-10-CM | POA: Diagnosis not present

## 2012-08-15 DIAGNOSIS — Z853 Personal history of malignant neoplasm of breast: Secondary | ICD-10-CM | POA: Diagnosis not present

## 2012-08-15 DIAGNOSIS — J438 Other emphysema: Secondary | ICD-10-CM | POA: Diagnosis not present

## 2012-08-16 DIAGNOSIS — N2581 Secondary hyperparathyroidism of renal origin: Secondary | ICD-10-CM | POA: Diagnosis not present

## 2012-08-16 DIAGNOSIS — N186 End stage renal disease: Secondary | ICD-10-CM | POA: Diagnosis not present

## 2012-08-16 DIAGNOSIS — Z23 Encounter for immunization: Secondary | ICD-10-CM | POA: Diagnosis not present

## 2012-08-16 DIAGNOSIS — Z992 Dependence on renal dialysis: Secondary | ICD-10-CM | POA: Diagnosis not present

## 2012-08-16 DIAGNOSIS — D509 Iron deficiency anemia, unspecified: Secondary | ICD-10-CM | POA: Diagnosis not present

## 2012-08-16 DIAGNOSIS — D631 Anemia in chronic kidney disease: Secondary | ICD-10-CM | POA: Diagnosis not present

## 2012-08-19 DIAGNOSIS — N2581 Secondary hyperparathyroidism of renal origin: Secondary | ICD-10-CM | POA: Diagnosis not present

## 2012-08-19 DIAGNOSIS — N186 End stage renal disease: Secondary | ICD-10-CM | POA: Diagnosis not present

## 2012-08-19 DIAGNOSIS — Z23 Encounter for immunization: Secondary | ICD-10-CM | POA: Diagnosis not present

## 2012-08-19 DIAGNOSIS — D509 Iron deficiency anemia, unspecified: Secondary | ICD-10-CM | POA: Diagnosis not present

## 2012-08-19 DIAGNOSIS — D631 Anemia in chronic kidney disease: Secondary | ICD-10-CM | POA: Diagnosis not present

## 2012-08-19 DIAGNOSIS — Z992 Dependence on renal dialysis: Secondary | ICD-10-CM | POA: Diagnosis not present

## 2012-08-21 DIAGNOSIS — N186 End stage renal disease: Secondary | ICD-10-CM | POA: Diagnosis not present

## 2012-08-21 DIAGNOSIS — N2581 Secondary hyperparathyroidism of renal origin: Secondary | ICD-10-CM | POA: Diagnosis not present

## 2012-08-21 DIAGNOSIS — D631 Anemia in chronic kidney disease: Secondary | ICD-10-CM | POA: Diagnosis not present

## 2012-08-21 DIAGNOSIS — Z23 Encounter for immunization: Secondary | ICD-10-CM | POA: Diagnosis not present

## 2012-08-21 DIAGNOSIS — N039 Chronic nephritic syndrome with unspecified morphologic changes: Secondary | ICD-10-CM | POA: Diagnosis not present

## 2012-08-21 DIAGNOSIS — D509 Iron deficiency anemia, unspecified: Secondary | ICD-10-CM | POA: Diagnosis not present

## 2012-08-21 DIAGNOSIS — Z992 Dependence on renal dialysis: Secondary | ICD-10-CM | POA: Diagnosis not present

## 2012-08-22 DIAGNOSIS — N19 Unspecified kidney failure: Secondary | ICD-10-CM | POA: Diagnosis not present

## 2012-08-22 DIAGNOSIS — E785 Hyperlipidemia, unspecified: Secondary | ICD-10-CM | POA: Diagnosis not present

## 2012-08-22 DIAGNOSIS — F411 Generalized anxiety disorder: Secondary | ICD-10-CM | POA: Diagnosis not present

## 2012-08-22 DIAGNOSIS — J449 Chronic obstructive pulmonary disease, unspecified: Secondary | ICD-10-CM | POA: Diagnosis not present

## 2012-08-23 DIAGNOSIS — Z992 Dependence on renal dialysis: Secondary | ICD-10-CM | POA: Diagnosis not present

## 2012-08-23 DIAGNOSIS — D631 Anemia in chronic kidney disease: Secondary | ICD-10-CM | POA: Diagnosis not present

## 2012-08-23 DIAGNOSIS — D509 Iron deficiency anemia, unspecified: Secondary | ICD-10-CM | POA: Diagnosis not present

## 2012-08-23 DIAGNOSIS — N186 End stage renal disease: Secondary | ICD-10-CM | POA: Diagnosis not present

## 2012-08-23 DIAGNOSIS — Z23 Encounter for immunization: Secondary | ICD-10-CM | POA: Diagnosis not present

## 2012-08-23 DIAGNOSIS — N2581 Secondary hyperparathyroidism of renal origin: Secondary | ICD-10-CM | POA: Diagnosis not present

## 2012-08-26 DIAGNOSIS — N186 End stage renal disease: Secondary | ICD-10-CM | POA: Diagnosis not present

## 2012-08-26 DIAGNOSIS — Z23 Encounter for immunization: Secondary | ICD-10-CM | POA: Diagnosis not present

## 2012-08-26 DIAGNOSIS — Z992 Dependence on renal dialysis: Secondary | ICD-10-CM | POA: Diagnosis not present

## 2012-08-26 DIAGNOSIS — D509 Iron deficiency anemia, unspecified: Secondary | ICD-10-CM | POA: Diagnosis not present

## 2012-08-26 DIAGNOSIS — D631 Anemia in chronic kidney disease: Secondary | ICD-10-CM | POA: Diagnosis not present

## 2012-08-26 DIAGNOSIS — N2581 Secondary hyperparathyroidism of renal origin: Secondary | ICD-10-CM | POA: Diagnosis not present

## 2012-08-28 DIAGNOSIS — Z23 Encounter for immunization: Secondary | ICD-10-CM | POA: Diagnosis not present

## 2012-08-28 DIAGNOSIS — D631 Anemia in chronic kidney disease: Secondary | ICD-10-CM | POA: Diagnosis not present

## 2012-08-28 DIAGNOSIS — Z992 Dependence on renal dialysis: Secondary | ICD-10-CM | POA: Diagnosis not present

## 2012-08-28 DIAGNOSIS — D509 Iron deficiency anemia, unspecified: Secondary | ICD-10-CM | POA: Diagnosis not present

## 2012-08-28 DIAGNOSIS — N2581 Secondary hyperparathyroidism of renal origin: Secondary | ICD-10-CM | POA: Diagnosis not present

## 2012-08-28 DIAGNOSIS — N186 End stage renal disease: Secondary | ICD-10-CM | POA: Diagnosis not present

## 2012-08-30 DIAGNOSIS — D509 Iron deficiency anemia, unspecified: Secondary | ICD-10-CM | POA: Diagnosis not present

## 2012-08-30 DIAGNOSIS — Z23 Encounter for immunization: Secondary | ICD-10-CM | POA: Diagnosis not present

## 2012-08-30 DIAGNOSIS — N2581 Secondary hyperparathyroidism of renal origin: Secondary | ICD-10-CM | POA: Diagnosis not present

## 2012-08-30 DIAGNOSIS — Z992 Dependence on renal dialysis: Secondary | ICD-10-CM | POA: Diagnosis not present

## 2012-08-30 DIAGNOSIS — N186 End stage renal disease: Secondary | ICD-10-CM | POA: Diagnosis not present

## 2012-08-30 DIAGNOSIS — N039 Chronic nephritic syndrome with unspecified morphologic changes: Secondary | ICD-10-CM | POA: Diagnosis not present

## 2012-09-02 DIAGNOSIS — N039 Chronic nephritic syndrome with unspecified morphologic changes: Secondary | ICD-10-CM | POA: Diagnosis not present

## 2012-09-02 DIAGNOSIS — Z992 Dependence on renal dialysis: Secondary | ICD-10-CM | POA: Diagnosis not present

## 2012-09-02 DIAGNOSIS — D509 Iron deficiency anemia, unspecified: Secondary | ICD-10-CM | POA: Diagnosis not present

## 2012-09-02 DIAGNOSIS — N186 End stage renal disease: Secondary | ICD-10-CM | POA: Diagnosis not present

## 2012-09-02 DIAGNOSIS — N2581 Secondary hyperparathyroidism of renal origin: Secondary | ICD-10-CM | POA: Diagnosis not present

## 2012-09-02 DIAGNOSIS — Z23 Encounter for immunization: Secondary | ICD-10-CM | POA: Diagnosis not present

## 2012-09-03 ENCOUNTER — Ambulatory Visit: Payer: Self-pay | Admitting: Otolaryngology

## 2012-09-03 DIAGNOSIS — E042 Nontoxic multinodular goiter: Secondary | ICD-10-CM | POA: Diagnosis not present

## 2012-09-03 DIAGNOSIS — N186 End stage renal disease: Secondary | ICD-10-CM | POA: Diagnosis not present

## 2012-09-03 DIAGNOSIS — E041 Nontoxic single thyroid nodule: Secondary | ICD-10-CM | POA: Diagnosis not present

## 2012-09-04 DIAGNOSIS — N039 Chronic nephritic syndrome with unspecified morphologic changes: Secondary | ICD-10-CM | POA: Diagnosis not present

## 2012-09-04 DIAGNOSIS — N2581 Secondary hyperparathyroidism of renal origin: Secondary | ICD-10-CM | POA: Diagnosis not present

## 2012-09-04 DIAGNOSIS — N186 End stage renal disease: Secondary | ICD-10-CM | POA: Diagnosis not present

## 2012-09-04 DIAGNOSIS — D631 Anemia in chronic kidney disease: Secondary | ICD-10-CM | POA: Diagnosis not present

## 2012-09-04 DIAGNOSIS — D509 Iron deficiency anemia, unspecified: Secondary | ICD-10-CM | POA: Diagnosis not present

## 2012-09-04 DIAGNOSIS — Z23 Encounter for immunization: Secondary | ICD-10-CM | POA: Diagnosis not present

## 2012-09-06 DIAGNOSIS — N186 End stage renal disease: Secondary | ICD-10-CM | POA: Diagnosis not present

## 2012-09-06 DIAGNOSIS — N039 Chronic nephritic syndrome with unspecified morphologic changes: Secondary | ICD-10-CM | POA: Diagnosis not present

## 2012-09-06 DIAGNOSIS — Z23 Encounter for immunization: Secondary | ICD-10-CM | POA: Diagnosis not present

## 2012-09-06 DIAGNOSIS — D509 Iron deficiency anemia, unspecified: Secondary | ICD-10-CM | POA: Diagnosis not present

## 2012-09-06 DIAGNOSIS — N2581 Secondary hyperparathyroidism of renal origin: Secondary | ICD-10-CM | POA: Diagnosis not present

## 2012-09-09 DIAGNOSIS — N2581 Secondary hyperparathyroidism of renal origin: Secondary | ICD-10-CM | POA: Diagnosis not present

## 2012-09-09 DIAGNOSIS — N186 End stage renal disease: Secondary | ICD-10-CM | POA: Diagnosis not present

## 2012-09-09 DIAGNOSIS — Z23 Encounter for immunization: Secondary | ICD-10-CM | POA: Diagnosis not present

## 2012-09-09 DIAGNOSIS — D509 Iron deficiency anemia, unspecified: Secondary | ICD-10-CM | POA: Diagnosis not present

## 2012-09-09 DIAGNOSIS — N039 Chronic nephritic syndrome with unspecified morphologic changes: Secondary | ICD-10-CM | POA: Diagnosis not present

## 2012-09-10 DIAGNOSIS — N2581 Secondary hyperparathyroidism of renal origin: Secondary | ICD-10-CM | POA: Diagnosis not present

## 2012-09-10 DIAGNOSIS — N039 Chronic nephritic syndrome with unspecified morphologic changes: Secondary | ICD-10-CM | POA: Diagnosis not present

## 2012-09-10 DIAGNOSIS — N186 End stage renal disease: Secondary | ICD-10-CM | POA: Diagnosis not present

## 2012-09-10 DIAGNOSIS — Z992 Dependence on renal dialysis: Secondary | ICD-10-CM | POA: Diagnosis not present

## 2012-09-10 DIAGNOSIS — D631 Anemia in chronic kidney disease: Secondary | ICD-10-CM | POA: Diagnosis not present

## 2012-09-10 DIAGNOSIS — D509 Iron deficiency anemia, unspecified: Secondary | ICD-10-CM | POA: Diagnosis not present

## 2012-09-11 DIAGNOSIS — N2581 Secondary hyperparathyroidism of renal origin: Secondary | ICD-10-CM | POA: Diagnosis not present

## 2012-09-11 DIAGNOSIS — N186 End stage renal disease: Secondary | ICD-10-CM | POA: Diagnosis not present

## 2012-09-11 DIAGNOSIS — D631 Anemia in chronic kidney disease: Secondary | ICD-10-CM | POA: Diagnosis not present

## 2012-09-11 DIAGNOSIS — D509 Iron deficiency anemia, unspecified: Secondary | ICD-10-CM | POA: Diagnosis not present

## 2012-09-11 DIAGNOSIS — Z992 Dependence on renal dialysis: Secondary | ICD-10-CM | POA: Diagnosis not present

## 2012-09-12 DIAGNOSIS — I10 Essential (primary) hypertension: Secondary | ICD-10-CM | POA: Diagnosis not present

## 2012-09-12 DIAGNOSIS — N2581 Secondary hyperparathyroidism of renal origin: Secondary | ICD-10-CM | POA: Diagnosis not present

## 2012-09-12 DIAGNOSIS — Z992 Dependence on renal dialysis: Secondary | ICD-10-CM | POA: Diagnosis not present

## 2012-09-12 DIAGNOSIS — N186 End stage renal disease: Secondary | ICD-10-CM | POA: Diagnosis not present

## 2012-09-12 DIAGNOSIS — D509 Iron deficiency anemia, unspecified: Secondary | ICD-10-CM | POA: Diagnosis not present

## 2012-09-12 DIAGNOSIS — E785 Hyperlipidemia, unspecified: Secondary | ICD-10-CM | POA: Diagnosis not present

## 2012-09-12 DIAGNOSIS — D631 Anemia in chronic kidney disease: Secondary | ICD-10-CM | POA: Diagnosis not present

## 2012-09-12 DIAGNOSIS — R109 Unspecified abdominal pain: Secondary | ICD-10-CM | POA: Diagnosis not present

## 2012-09-13 DIAGNOSIS — Z992 Dependence on renal dialysis: Secondary | ICD-10-CM | POA: Diagnosis not present

## 2012-09-13 DIAGNOSIS — N186 End stage renal disease: Secondary | ICD-10-CM | POA: Diagnosis not present

## 2012-09-13 DIAGNOSIS — D631 Anemia in chronic kidney disease: Secondary | ICD-10-CM | POA: Diagnosis not present

## 2012-09-13 DIAGNOSIS — D509 Iron deficiency anemia, unspecified: Secondary | ICD-10-CM | POA: Diagnosis not present

## 2012-09-13 DIAGNOSIS — N2581 Secondary hyperparathyroidism of renal origin: Secondary | ICD-10-CM | POA: Diagnosis not present

## 2012-09-16 DIAGNOSIS — N039 Chronic nephritic syndrome with unspecified morphologic changes: Secondary | ICD-10-CM | POA: Diagnosis not present

## 2012-09-16 DIAGNOSIS — N2581 Secondary hyperparathyroidism of renal origin: Secondary | ICD-10-CM | POA: Diagnosis not present

## 2012-09-16 DIAGNOSIS — Z992 Dependence on renal dialysis: Secondary | ICD-10-CM | POA: Diagnosis not present

## 2012-09-16 DIAGNOSIS — N186 End stage renal disease: Secondary | ICD-10-CM | POA: Diagnosis not present

## 2012-09-16 DIAGNOSIS — D509 Iron deficiency anemia, unspecified: Secondary | ICD-10-CM | POA: Diagnosis not present

## 2012-09-17 DIAGNOSIS — D631 Anemia in chronic kidney disease: Secondary | ICD-10-CM | POA: Diagnosis not present

## 2012-09-17 DIAGNOSIS — N2581 Secondary hyperparathyroidism of renal origin: Secondary | ICD-10-CM | POA: Diagnosis not present

## 2012-09-17 DIAGNOSIS — D509 Iron deficiency anemia, unspecified: Secondary | ICD-10-CM | POA: Diagnosis not present

## 2012-09-17 DIAGNOSIS — N186 End stage renal disease: Secondary | ICD-10-CM | POA: Diagnosis not present

## 2012-09-17 DIAGNOSIS — Z992 Dependence on renal dialysis: Secondary | ICD-10-CM | POA: Diagnosis not present

## 2012-09-18 DIAGNOSIS — D509 Iron deficiency anemia, unspecified: Secondary | ICD-10-CM | POA: Diagnosis not present

## 2012-09-18 DIAGNOSIS — Z992 Dependence on renal dialysis: Secondary | ICD-10-CM | POA: Diagnosis not present

## 2012-09-18 DIAGNOSIS — N186 End stage renal disease: Secondary | ICD-10-CM | POA: Diagnosis not present

## 2012-09-18 DIAGNOSIS — N2581 Secondary hyperparathyroidism of renal origin: Secondary | ICD-10-CM | POA: Diagnosis not present

## 2012-09-18 DIAGNOSIS — N039 Chronic nephritic syndrome with unspecified morphologic changes: Secondary | ICD-10-CM | POA: Diagnosis not present

## 2012-09-19 DIAGNOSIS — N186 End stage renal disease: Secondary | ICD-10-CM | POA: Diagnosis not present

## 2012-09-19 DIAGNOSIS — N2581 Secondary hyperparathyroidism of renal origin: Secondary | ICD-10-CM | POA: Diagnosis not present

## 2012-09-19 DIAGNOSIS — D509 Iron deficiency anemia, unspecified: Secondary | ICD-10-CM | POA: Diagnosis not present

## 2012-09-19 DIAGNOSIS — N039 Chronic nephritic syndrome with unspecified morphologic changes: Secondary | ICD-10-CM | POA: Diagnosis not present

## 2012-09-19 DIAGNOSIS — Z992 Dependence on renal dialysis: Secondary | ICD-10-CM | POA: Diagnosis not present

## 2012-09-23 DIAGNOSIS — Z992 Dependence on renal dialysis: Secondary | ICD-10-CM | POA: Diagnosis not present

## 2012-09-23 DIAGNOSIS — D631 Anemia in chronic kidney disease: Secondary | ICD-10-CM | POA: Diagnosis not present

## 2012-09-23 DIAGNOSIS — N186 End stage renal disease: Secondary | ICD-10-CM | POA: Diagnosis not present

## 2012-09-23 DIAGNOSIS — N2581 Secondary hyperparathyroidism of renal origin: Secondary | ICD-10-CM | POA: Diagnosis not present

## 2012-09-23 DIAGNOSIS — D509 Iron deficiency anemia, unspecified: Secondary | ICD-10-CM | POA: Diagnosis not present

## 2012-09-27 DIAGNOSIS — N186 End stage renal disease: Secondary | ICD-10-CM | POA: Diagnosis not present

## 2012-09-27 DIAGNOSIS — N039 Chronic nephritic syndrome with unspecified morphologic changes: Secondary | ICD-10-CM | POA: Diagnosis not present

## 2012-09-28 DIAGNOSIS — N186 End stage renal disease: Secondary | ICD-10-CM | POA: Diagnosis not present

## 2012-09-28 DIAGNOSIS — N039 Chronic nephritic syndrome with unspecified morphologic changes: Secondary | ICD-10-CM | POA: Diagnosis not present

## 2012-09-29 DIAGNOSIS — D631 Anemia in chronic kidney disease: Secondary | ICD-10-CM | POA: Diagnosis not present

## 2012-09-29 DIAGNOSIS — N186 End stage renal disease: Secondary | ICD-10-CM | POA: Diagnosis not present

## 2012-09-30 DIAGNOSIS — D631 Anemia in chronic kidney disease: Secondary | ICD-10-CM | POA: Diagnosis not present

## 2012-09-30 DIAGNOSIS — N186 End stage renal disease: Secondary | ICD-10-CM | POA: Diagnosis not present

## 2012-10-01 DIAGNOSIS — N186 End stage renal disease: Secondary | ICD-10-CM | POA: Diagnosis not present

## 2012-10-01 DIAGNOSIS — D631 Anemia in chronic kidney disease: Secondary | ICD-10-CM | POA: Diagnosis not present

## 2012-10-02 DIAGNOSIS — D631 Anemia in chronic kidney disease: Secondary | ICD-10-CM | POA: Diagnosis not present

## 2012-10-02 DIAGNOSIS — N186 End stage renal disease: Secondary | ICD-10-CM | POA: Diagnosis not present

## 2012-10-03 DIAGNOSIS — N039 Chronic nephritic syndrome with unspecified morphologic changes: Secondary | ICD-10-CM | POA: Diagnosis not present

## 2012-10-03 DIAGNOSIS — N186 End stage renal disease: Secondary | ICD-10-CM | POA: Diagnosis not present

## 2012-10-04 DIAGNOSIS — N039 Chronic nephritic syndrome with unspecified morphologic changes: Secondary | ICD-10-CM | POA: Diagnosis not present

## 2012-10-04 DIAGNOSIS — D509 Iron deficiency anemia, unspecified: Secondary | ICD-10-CM | POA: Diagnosis not present

## 2012-10-04 DIAGNOSIS — N2581 Secondary hyperparathyroidism of renal origin: Secondary | ICD-10-CM | POA: Diagnosis not present

## 2012-10-04 DIAGNOSIS — N186 End stage renal disease: Secondary | ICD-10-CM | POA: Diagnosis not present

## 2012-10-04 DIAGNOSIS — Z23 Encounter for immunization: Secondary | ICD-10-CM | POA: Diagnosis not present

## 2012-10-04 DIAGNOSIS — D631 Anemia in chronic kidney disease: Secondary | ICD-10-CM | POA: Diagnosis not present

## 2012-10-05 DIAGNOSIS — Z23 Encounter for immunization: Secondary | ICD-10-CM | POA: Diagnosis not present

## 2012-10-05 DIAGNOSIS — D509 Iron deficiency anemia, unspecified: Secondary | ICD-10-CM | POA: Diagnosis not present

## 2012-10-05 DIAGNOSIS — N2581 Secondary hyperparathyroidism of renal origin: Secondary | ICD-10-CM | POA: Diagnosis not present

## 2012-10-05 DIAGNOSIS — N039 Chronic nephritic syndrome with unspecified morphologic changes: Secondary | ICD-10-CM | POA: Diagnosis not present

## 2012-10-05 DIAGNOSIS — N186 End stage renal disease: Secondary | ICD-10-CM | POA: Diagnosis not present

## 2012-10-06 DIAGNOSIS — D509 Iron deficiency anemia, unspecified: Secondary | ICD-10-CM | POA: Diagnosis not present

## 2012-10-06 DIAGNOSIS — N186 End stage renal disease: Secondary | ICD-10-CM | POA: Diagnosis not present

## 2012-10-06 DIAGNOSIS — N2581 Secondary hyperparathyroidism of renal origin: Secondary | ICD-10-CM | POA: Diagnosis not present

## 2012-10-06 DIAGNOSIS — Z23 Encounter for immunization: Secondary | ICD-10-CM | POA: Diagnosis not present

## 2012-10-06 DIAGNOSIS — D631 Anemia in chronic kidney disease: Secondary | ICD-10-CM | POA: Diagnosis not present

## 2012-10-07 DIAGNOSIS — Z23 Encounter for immunization: Secondary | ICD-10-CM | POA: Diagnosis not present

## 2012-10-07 DIAGNOSIS — N2581 Secondary hyperparathyroidism of renal origin: Secondary | ICD-10-CM | POA: Diagnosis not present

## 2012-10-07 DIAGNOSIS — N039 Chronic nephritic syndrome with unspecified morphologic changes: Secondary | ICD-10-CM | POA: Diagnosis not present

## 2012-10-07 DIAGNOSIS — N186 End stage renal disease: Secondary | ICD-10-CM | POA: Diagnosis not present

## 2012-10-07 DIAGNOSIS — D509 Iron deficiency anemia, unspecified: Secondary | ICD-10-CM | POA: Diagnosis not present

## 2012-10-08 DIAGNOSIS — D631 Anemia in chronic kidney disease: Secondary | ICD-10-CM | POA: Diagnosis not present

## 2012-10-08 DIAGNOSIS — D509 Iron deficiency anemia, unspecified: Secondary | ICD-10-CM | POA: Diagnosis not present

## 2012-10-08 DIAGNOSIS — N2581 Secondary hyperparathyroidism of renal origin: Secondary | ICD-10-CM | POA: Diagnosis not present

## 2012-10-08 DIAGNOSIS — N186 End stage renal disease: Secondary | ICD-10-CM | POA: Diagnosis not present

## 2012-10-08 DIAGNOSIS — Z23 Encounter for immunization: Secondary | ICD-10-CM | POA: Diagnosis not present

## 2012-10-09 ENCOUNTER — Other Ambulatory Visit: Payer: Self-pay

## 2012-10-09 DIAGNOSIS — N039 Chronic nephritic syndrome with unspecified morphologic changes: Secondary | ICD-10-CM | POA: Diagnosis not present

## 2012-10-09 DIAGNOSIS — D509 Iron deficiency anemia, unspecified: Secondary | ICD-10-CM | POA: Diagnosis not present

## 2012-10-09 DIAGNOSIS — N2581 Secondary hyperparathyroidism of renal origin: Secondary | ICD-10-CM | POA: Diagnosis not present

## 2012-10-09 DIAGNOSIS — N186 End stage renal disease: Secondary | ICD-10-CM | POA: Diagnosis not present

## 2012-10-09 DIAGNOSIS — Z23 Encounter for immunization: Secondary | ICD-10-CM | POA: Diagnosis not present

## 2012-10-10 DIAGNOSIS — N186 End stage renal disease: Secondary | ICD-10-CM | POA: Diagnosis not present

## 2012-10-10 DIAGNOSIS — N2581 Secondary hyperparathyroidism of renal origin: Secondary | ICD-10-CM | POA: Diagnosis not present

## 2012-10-10 DIAGNOSIS — D631 Anemia in chronic kidney disease: Secondary | ICD-10-CM | POA: Diagnosis not present

## 2012-10-10 DIAGNOSIS — D509 Iron deficiency anemia, unspecified: Secondary | ICD-10-CM | POA: Diagnosis not present

## 2012-10-10 DIAGNOSIS — Z23 Encounter for immunization: Secondary | ICD-10-CM | POA: Diagnosis not present

## 2012-10-11 DIAGNOSIS — Z23 Encounter for immunization: Secondary | ICD-10-CM | POA: Diagnosis not present

## 2012-10-11 DIAGNOSIS — N2581 Secondary hyperparathyroidism of renal origin: Secondary | ICD-10-CM | POA: Diagnosis not present

## 2012-10-11 DIAGNOSIS — D509 Iron deficiency anemia, unspecified: Secondary | ICD-10-CM | POA: Diagnosis not present

## 2012-10-11 DIAGNOSIS — N039 Chronic nephritic syndrome with unspecified morphologic changes: Secondary | ICD-10-CM | POA: Diagnosis not present

## 2012-10-11 DIAGNOSIS — N186 End stage renal disease: Secondary | ICD-10-CM | POA: Diagnosis not present

## 2012-10-12 DIAGNOSIS — Z23 Encounter for immunization: Secondary | ICD-10-CM | POA: Diagnosis not present

## 2012-10-12 DIAGNOSIS — D631 Anemia in chronic kidney disease: Secondary | ICD-10-CM | POA: Diagnosis not present

## 2012-10-12 DIAGNOSIS — N2581 Secondary hyperparathyroidism of renal origin: Secondary | ICD-10-CM | POA: Diagnosis not present

## 2012-10-12 DIAGNOSIS — D509 Iron deficiency anemia, unspecified: Secondary | ICD-10-CM | POA: Diagnosis not present

## 2012-10-12 DIAGNOSIS — N186 End stage renal disease: Secondary | ICD-10-CM | POA: Diagnosis not present

## 2012-10-13 DIAGNOSIS — N039 Chronic nephritic syndrome with unspecified morphologic changes: Secondary | ICD-10-CM | POA: Diagnosis not present

## 2012-10-13 DIAGNOSIS — D509 Iron deficiency anemia, unspecified: Secondary | ICD-10-CM | POA: Diagnosis not present

## 2012-10-13 DIAGNOSIS — Z23 Encounter for immunization: Secondary | ICD-10-CM | POA: Diagnosis not present

## 2012-10-13 DIAGNOSIS — N186 End stage renal disease: Secondary | ICD-10-CM | POA: Diagnosis not present

## 2012-10-13 DIAGNOSIS — N2581 Secondary hyperparathyroidism of renal origin: Secondary | ICD-10-CM | POA: Diagnosis not present

## 2012-10-14 DIAGNOSIS — D631 Anemia in chronic kidney disease: Secondary | ICD-10-CM | POA: Diagnosis not present

## 2012-10-14 DIAGNOSIS — N186 End stage renal disease: Secondary | ICD-10-CM | POA: Diagnosis not present

## 2012-10-14 DIAGNOSIS — Z23 Encounter for immunization: Secondary | ICD-10-CM | POA: Diagnosis not present

## 2012-10-14 DIAGNOSIS — N2581 Secondary hyperparathyroidism of renal origin: Secondary | ICD-10-CM | POA: Diagnosis not present

## 2012-10-14 DIAGNOSIS — D509 Iron deficiency anemia, unspecified: Secondary | ICD-10-CM | POA: Diagnosis not present

## 2012-10-15 ENCOUNTER — Encounter (HOSPITAL_COMMUNITY)
Admission: RE | Admit: 2012-10-15 | Discharge: 2012-10-15 | Disposition: A | Discharge status: Home / Self Care | Payer: Medicare Other | Source: Ambulatory Visit | Attending: Vascular Surgery | Admitting: Vascular Surgery

## 2012-10-15 DIAGNOSIS — Z23 Encounter for immunization: Secondary | ICD-10-CM | POA: Diagnosis not present

## 2012-10-15 DIAGNOSIS — D509 Iron deficiency anemia, unspecified: Secondary | ICD-10-CM | POA: Diagnosis not present

## 2012-10-15 DIAGNOSIS — N039 Chronic nephritic syndrome with unspecified morphologic changes: Secondary | ICD-10-CM | POA: Diagnosis not present

## 2012-10-15 DIAGNOSIS — N186 End stage renal disease: Secondary | ICD-10-CM | POA: Diagnosis not present

## 2012-10-15 DIAGNOSIS — N2581 Secondary hyperparathyroidism of renal origin: Secondary | ICD-10-CM | POA: Diagnosis not present

## 2012-10-15 NOTE — Progress Notes (Signed)
[facility] Catheter Removal Procedure Note  Diagnosis: ESRD  Plan:  Remove right diatek catheter  Consent signed:  yes Time out completed:  yes Coumadin:  no PT/INR (if applicable):   Other labs:  Procedure: 1.  Sterile prepping and draping over catheter area 2. 7 ml 2% lidocaine plain instilled at removal site. 3.  right catheter removed in its entirety with cuff in tact. 4.  Complications:  none 5. Tip of catheter sent for culture:  no   Patient tolerated procedure well:  yes Pressure held, no bleeding noted, dressing applied Instructions given to the pt regarding wound care and bleeding.  Other:  Doreatha Massed, PA-C 10/15/2012 12:36 PM

## 2012-10-15 NOTE — Progress Notes (Signed)
[facility] SHORT STAY H&P  CC:  Catheter removal   HPI:  This is a 72 y.o. female here for diatek catheter removal.  She states that she is on PD at home.  They are adjusting her treatments and it is working much better.  She denies allergy to betadine or lidocaine.   She is on a baby aspirin, but no other anticoagulants.   Past Medical History  Diagnosis Date  . Allergy   . Cancer 2008    left breast  . Hypertension 2006  . Personal history of tobacco use, presenting hazards to health 2012    30 yrs smoking  . Endocrine disorder 2008    thyroid  . Personal history of malignant neoplasm of breast 2008  . Breast screening, unspecified 2013  . Special screening for malignant neoplasms, colon 2013  . COPD (chronic obstructive pulmonary disease)   . PE (pulmonary embolism)     patient denies  . Swelling of throat     swelling at base of throat,  . Anxiety     anxiety  . Pneumonia     hx  . Heart murmur     child  . History of kidney stones   . GERD (gastroesophageal reflux disease)   . Arthritis   . Multinodular goiter     followed by Dr. Bud Face @ [facility]  . Dialysis patient 2014  . Chronic kidney disease 2014    stage IV chronic     FH:  Non-Contributory  History   Social History  . Marital Status: Widowed    Spouse Name: N/A    Number of Children: N/A  . Years of Education: N/A   Occupational History  . Not on file.   Social History Main Topics  . Smoking status: Current Every Day Smoker -- 1.00 packs/day for 30 years    Types: Cigarettes  . Smokeless tobacco: Not on file  . Alcohol Use: No  . Drug Use: No  . Sexually Active: Not on file   Other Topics Concern  . Not on file   Social History Narrative  . No narrative on file    Allergies  Allergen Reactions  . Bee Venom Swelling and Other (See Comments)    Breathing problems  . Biaxin (Clarithromycin) Other (See Comments)    Throat swells  . Mycostatin  (Nystatin) Other (See Comments)    Blisters from the ointment  . Sulfa Antibiotics Other (See Comments)    "welps"    Current Outpatient Prescriptions  Medication Sig Dispense Refill  . aspirin 81 MG tablet Take 81 mg by mouth daily.      . Cholecalciferol (VITAMIN D3) 2000 UNITS TABS Take 1 tablet by mouth daily.      Marland Kitchen LORazepam (ATIVAN) 1 MG tablet Take 1 mg by mouth at bedtime as needed for anxiety.       Marland Kitchen omeprazole (PRILOSEC) 20 MG capsule Take 20 mg by mouth daily.      . rosuvastatin (CRESTOR) 10 MG tablet Take 10 mg by mouth daily.      Marland Kitchen tiotropium (SPIRIVA) 18 MCG inhalation capsule Place 18 mcg into inhaler and inhale daily.       No current facility-administered medications for this encounter.    ROS:  See HPI  PHYSICAL EXAM  There were no vitals filed for this visit.  Gen:  Well developed well nourished HEENT:  normocephalic Neck:  Right IJ catheter in place Heart:  RRR  Lungs:  Non-labored Abdomen:  Soft with PD cath in place Extremities:  + palpable radial pulses bilaterally Skin:  No obvious rashes Neuro:  In tact  Lab/X-ray:  Impression: This is a 72 y.o. female here for diatek catheter removal  Plan:  Removal of right diatek catheter  Doreatha Massed, PA-C [facility] [phone] 10/15/2012 12:35 PM

## 2012-10-16 DIAGNOSIS — N2581 Secondary hyperparathyroidism of renal origin: Secondary | ICD-10-CM | POA: Diagnosis not present

## 2012-10-16 DIAGNOSIS — N039 Chronic nephritic syndrome with unspecified morphologic changes: Secondary | ICD-10-CM | POA: Diagnosis not present

## 2012-10-16 DIAGNOSIS — D509 Iron deficiency anemia, unspecified: Secondary | ICD-10-CM | POA: Diagnosis not present

## 2012-10-16 DIAGNOSIS — D631 Anemia in chronic kidney disease: Secondary | ICD-10-CM | POA: Diagnosis not present

## 2012-10-16 DIAGNOSIS — N186 End stage renal disease: Secondary | ICD-10-CM | POA: Diagnosis not present

## 2012-10-16 DIAGNOSIS — Z23 Encounter for immunization: Secondary | ICD-10-CM | POA: Diagnosis not present

## 2012-10-17 ENCOUNTER — Ambulatory Visit: Payer: Self-pay | Admitting: Oncology

## 2012-10-17 DIAGNOSIS — N039 Chronic nephritic syndrome with unspecified morphologic changes: Secondary | ICD-10-CM | POA: Diagnosis not present

## 2012-10-17 DIAGNOSIS — M810 Age-related osteoporosis without current pathological fracture: Secondary | ICD-10-CM | POA: Diagnosis not present

## 2012-10-17 DIAGNOSIS — D509 Iron deficiency anemia, unspecified: Secondary | ICD-10-CM | POA: Diagnosis not present

## 2012-10-17 DIAGNOSIS — N186 End stage renal disease: Secondary | ICD-10-CM | POA: Diagnosis not present

## 2012-10-17 DIAGNOSIS — M949 Disorder of cartilage, unspecified: Secondary | ICD-10-CM | POA: Diagnosis not present

## 2012-10-17 DIAGNOSIS — Z23 Encounter for immunization: Secondary | ICD-10-CM | POA: Diagnosis not present

## 2012-10-17 DIAGNOSIS — Z79899 Other long term (current) drug therapy: Secondary | ICD-10-CM | POA: Diagnosis not present

## 2012-10-17 DIAGNOSIS — N2581 Secondary hyperparathyroidism of renal origin: Secondary | ICD-10-CM | POA: Diagnosis not present

## 2012-10-17 DIAGNOSIS — M899 Disorder of bone, unspecified: Secondary | ICD-10-CM | POA: Diagnosis not present

## 2012-10-18 DIAGNOSIS — Z23 Encounter for immunization: Secondary | ICD-10-CM | POA: Diagnosis not present

## 2012-10-18 DIAGNOSIS — D509 Iron deficiency anemia, unspecified: Secondary | ICD-10-CM | POA: Diagnosis not present

## 2012-10-18 DIAGNOSIS — N2581 Secondary hyperparathyroidism of renal origin: Secondary | ICD-10-CM | POA: Diagnosis not present

## 2012-10-18 DIAGNOSIS — N039 Chronic nephritic syndrome with unspecified morphologic changes: Secondary | ICD-10-CM | POA: Diagnosis not present

## 2012-10-18 DIAGNOSIS — N186 End stage renal disease: Secondary | ICD-10-CM | POA: Diagnosis not present

## 2012-10-19 DIAGNOSIS — N186 End stage renal disease: Secondary | ICD-10-CM | POA: Diagnosis not present

## 2012-10-19 DIAGNOSIS — N2581 Secondary hyperparathyroidism of renal origin: Secondary | ICD-10-CM | POA: Diagnosis not present

## 2012-10-19 DIAGNOSIS — D509 Iron deficiency anemia, unspecified: Secondary | ICD-10-CM | POA: Diagnosis not present

## 2012-10-19 DIAGNOSIS — Z23 Encounter for immunization: Secondary | ICD-10-CM | POA: Diagnosis not present

## 2012-10-19 DIAGNOSIS — D631 Anemia in chronic kidney disease: Secondary | ICD-10-CM | POA: Diagnosis not present

## 2012-10-20 DIAGNOSIS — D509 Iron deficiency anemia, unspecified: Secondary | ICD-10-CM | POA: Diagnosis not present

## 2012-10-20 DIAGNOSIS — Z23 Encounter for immunization: Secondary | ICD-10-CM | POA: Diagnosis not present

## 2012-10-20 DIAGNOSIS — N039 Chronic nephritic syndrome with unspecified morphologic changes: Secondary | ICD-10-CM | POA: Diagnosis not present

## 2012-10-20 DIAGNOSIS — N186 End stage renal disease: Secondary | ICD-10-CM | POA: Diagnosis not present

## 2012-10-20 DIAGNOSIS — N2581 Secondary hyperparathyroidism of renal origin: Secondary | ICD-10-CM | POA: Diagnosis not present

## 2012-10-21 DIAGNOSIS — N2581 Secondary hyperparathyroidism of renal origin: Secondary | ICD-10-CM | POA: Diagnosis not present

## 2012-10-21 DIAGNOSIS — D509 Iron deficiency anemia, unspecified: Secondary | ICD-10-CM | POA: Diagnosis not present

## 2012-10-21 DIAGNOSIS — N186 End stage renal disease: Secondary | ICD-10-CM | POA: Diagnosis not present

## 2012-10-21 DIAGNOSIS — Z23 Encounter for immunization: Secondary | ICD-10-CM | POA: Diagnosis not present

## 2012-10-21 DIAGNOSIS — D631 Anemia in chronic kidney disease: Secondary | ICD-10-CM | POA: Diagnosis not present

## 2012-10-22 DIAGNOSIS — Z23 Encounter for immunization: Secondary | ICD-10-CM | POA: Diagnosis not present

## 2012-10-22 DIAGNOSIS — D631 Anemia in chronic kidney disease: Secondary | ICD-10-CM | POA: Diagnosis not present

## 2012-10-22 DIAGNOSIS — N2581 Secondary hyperparathyroidism of renal origin: Secondary | ICD-10-CM | POA: Diagnosis not present

## 2012-10-22 DIAGNOSIS — N186 End stage renal disease: Secondary | ICD-10-CM | POA: Diagnosis not present

## 2012-10-22 DIAGNOSIS — D509 Iron deficiency anemia, unspecified: Secondary | ICD-10-CM | POA: Diagnosis not present

## 2012-10-23 DIAGNOSIS — D509 Iron deficiency anemia, unspecified: Secondary | ICD-10-CM | POA: Diagnosis not present

## 2012-10-23 DIAGNOSIS — N186 End stage renal disease: Secondary | ICD-10-CM | POA: Diagnosis not present

## 2012-10-23 DIAGNOSIS — N2581 Secondary hyperparathyroidism of renal origin: Secondary | ICD-10-CM | POA: Diagnosis not present

## 2012-10-23 DIAGNOSIS — D631 Anemia in chronic kidney disease: Secondary | ICD-10-CM | POA: Diagnosis not present

## 2012-10-23 DIAGNOSIS — Z23 Encounter for immunization: Secondary | ICD-10-CM | POA: Diagnosis not present

## 2012-10-24 DIAGNOSIS — N186 End stage renal disease: Secondary | ICD-10-CM | POA: Diagnosis not present

## 2012-10-24 DIAGNOSIS — D509 Iron deficiency anemia, unspecified: Secondary | ICD-10-CM | POA: Diagnosis not present

## 2012-10-24 DIAGNOSIS — I10 Essential (primary) hypertension: Secondary | ICD-10-CM | POA: Diagnosis not present

## 2012-10-24 DIAGNOSIS — D631 Anemia in chronic kidney disease: Secondary | ICD-10-CM | POA: Diagnosis not present

## 2012-10-24 DIAGNOSIS — Z23 Encounter for immunization: Secondary | ICD-10-CM | POA: Diagnosis not present

## 2012-10-24 DIAGNOSIS — F411 Generalized anxiety disorder: Secondary | ICD-10-CM | POA: Diagnosis not present

## 2012-10-24 DIAGNOSIS — N19 Unspecified kidney failure: Secondary | ICD-10-CM | POA: Diagnosis not present

## 2012-10-24 DIAGNOSIS — N2581 Secondary hyperparathyroidism of renal origin: Secondary | ICD-10-CM | POA: Diagnosis not present

## 2012-10-24 DIAGNOSIS — J449 Chronic obstructive pulmonary disease, unspecified: Secondary | ICD-10-CM | POA: Diagnosis not present

## 2012-10-25 DIAGNOSIS — N186 End stage renal disease: Secondary | ICD-10-CM | POA: Diagnosis not present

## 2012-10-25 DIAGNOSIS — N2581 Secondary hyperparathyroidism of renal origin: Secondary | ICD-10-CM | POA: Diagnosis not present

## 2012-10-25 DIAGNOSIS — Z23 Encounter for immunization: Secondary | ICD-10-CM | POA: Diagnosis not present

## 2012-10-25 DIAGNOSIS — D631 Anemia in chronic kidney disease: Secondary | ICD-10-CM | POA: Diagnosis not present

## 2012-10-25 DIAGNOSIS — D509 Iron deficiency anemia, unspecified: Secondary | ICD-10-CM | POA: Diagnosis not present

## 2012-10-26 DIAGNOSIS — N186 End stage renal disease: Secondary | ICD-10-CM | POA: Diagnosis not present

## 2012-10-26 DIAGNOSIS — N2581 Secondary hyperparathyroidism of renal origin: Secondary | ICD-10-CM | POA: Diagnosis not present

## 2012-10-26 DIAGNOSIS — D509 Iron deficiency anemia, unspecified: Secondary | ICD-10-CM | POA: Diagnosis not present

## 2012-10-26 DIAGNOSIS — Z23 Encounter for immunization: Secondary | ICD-10-CM | POA: Diagnosis not present

## 2012-10-26 DIAGNOSIS — D631 Anemia in chronic kidney disease: Secondary | ICD-10-CM | POA: Diagnosis not present

## 2012-10-27 DIAGNOSIS — Z23 Encounter for immunization: Secondary | ICD-10-CM | POA: Diagnosis not present

## 2012-10-27 DIAGNOSIS — N186 End stage renal disease: Secondary | ICD-10-CM | POA: Diagnosis not present

## 2012-10-27 DIAGNOSIS — D509 Iron deficiency anemia, unspecified: Secondary | ICD-10-CM | POA: Diagnosis not present

## 2012-10-27 DIAGNOSIS — N2581 Secondary hyperparathyroidism of renal origin: Secondary | ICD-10-CM | POA: Diagnosis not present

## 2012-10-27 DIAGNOSIS — D631 Anemia in chronic kidney disease: Secondary | ICD-10-CM | POA: Diagnosis not present

## 2012-10-28 DIAGNOSIS — N2581 Secondary hyperparathyroidism of renal origin: Secondary | ICD-10-CM | POA: Diagnosis not present

## 2012-10-28 DIAGNOSIS — N186 End stage renal disease: Secondary | ICD-10-CM | POA: Diagnosis not present

## 2012-10-28 DIAGNOSIS — D631 Anemia in chronic kidney disease: Secondary | ICD-10-CM | POA: Diagnosis not present

## 2012-10-28 DIAGNOSIS — D509 Iron deficiency anemia, unspecified: Secondary | ICD-10-CM | POA: Diagnosis not present

## 2012-10-28 DIAGNOSIS — Z23 Encounter for immunization: Secondary | ICD-10-CM | POA: Diagnosis not present

## 2012-10-29 DIAGNOSIS — N186 End stage renal disease: Secondary | ICD-10-CM | POA: Diagnosis not present

## 2012-10-29 DIAGNOSIS — D509 Iron deficiency anemia, unspecified: Secondary | ICD-10-CM | POA: Diagnosis not present

## 2012-10-29 DIAGNOSIS — Z23 Encounter for immunization: Secondary | ICD-10-CM | POA: Diagnosis not present

## 2012-10-29 DIAGNOSIS — N2581 Secondary hyperparathyroidism of renal origin: Secondary | ICD-10-CM | POA: Diagnosis not present

## 2012-10-29 DIAGNOSIS — D631 Anemia in chronic kidney disease: Secondary | ICD-10-CM | POA: Diagnosis not present

## 2012-10-30 DIAGNOSIS — N186 End stage renal disease: Secondary | ICD-10-CM | POA: Diagnosis not present

## 2012-10-30 DIAGNOSIS — N2581 Secondary hyperparathyroidism of renal origin: Secondary | ICD-10-CM | POA: Diagnosis not present

## 2012-10-30 DIAGNOSIS — D631 Anemia in chronic kidney disease: Secondary | ICD-10-CM | POA: Diagnosis not present

## 2012-10-30 DIAGNOSIS — D509 Iron deficiency anemia, unspecified: Secondary | ICD-10-CM | POA: Diagnosis not present

## 2012-10-30 DIAGNOSIS — Z23 Encounter for immunization: Secondary | ICD-10-CM | POA: Diagnosis not present

## 2012-10-31 DIAGNOSIS — D509 Iron deficiency anemia, unspecified: Secondary | ICD-10-CM | POA: Diagnosis not present

## 2012-10-31 DIAGNOSIS — N2581 Secondary hyperparathyroidism of renal origin: Secondary | ICD-10-CM | POA: Diagnosis not present

## 2012-10-31 DIAGNOSIS — N186 End stage renal disease: Secondary | ICD-10-CM | POA: Diagnosis not present

## 2012-10-31 DIAGNOSIS — D631 Anemia in chronic kidney disease: Secondary | ICD-10-CM | POA: Diagnosis not present

## 2012-10-31 DIAGNOSIS — Z23 Encounter for immunization: Secondary | ICD-10-CM | POA: Diagnosis not present

## 2012-11-01 DIAGNOSIS — D509 Iron deficiency anemia, unspecified: Secondary | ICD-10-CM | POA: Diagnosis not present

## 2012-11-01 DIAGNOSIS — D631 Anemia in chronic kidney disease: Secondary | ICD-10-CM | POA: Diagnosis not present

## 2012-11-01 DIAGNOSIS — Z23 Encounter for immunization: Secondary | ICD-10-CM | POA: Diagnosis not present

## 2012-11-01 DIAGNOSIS — N186 End stage renal disease: Secondary | ICD-10-CM | POA: Diagnosis not present

## 2012-11-01 DIAGNOSIS — N2581 Secondary hyperparathyroidism of renal origin: Secondary | ICD-10-CM | POA: Diagnosis not present

## 2012-11-02 DIAGNOSIS — N186 End stage renal disease: Secondary | ICD-10-CM | POA: Diagnosis not present

## 2012-11-02 DIAGNOSIS — Z23 Encounter for immunization: Secondary | ICD-10-CM | POA: Diagnosis not present

## 2012-11-02 DIAGNOSIS — D631 Anemia in chronic kidney disease: Secondary | ICD-10-CM | POA: Diagnosis not present

## 2012-11-02 DIAGNOSIS — D509 Iron deficiency anemia, unspecified: Secondary | ICD-10-CM | POA: Diagnosis not present

## 2012-11-02 DIAGNOSIS — N2581 Secondary hyperparathyroidism of renal origin: Secondary | ICD-10-CM | POA: Diagnosis not present

## 2012-11-03 DIAGNOSIS — D631 Anemia in chronic kidney disease: Secondary | ICD-10-CM | POA: Diagnosis not present

## 2012-11-03 DIAGNOSIS — N186 End stage renal disease: Secondary | ICD-10-CM | POA: Diagnosis not present

## 2012-11-03 DIAGNOSIS — D509 Iron deficiency anemia, unspecified: Secondary | ICD-10-CM | POA: Diagnosis not present

## 2012-11-03 DIAGNOSIS — N2581 Secondary hyperparathyroidism of renal origin: Secondary | ICD-10-CM | POA: Diagnosis not present

## 2012-11-03 DIAGNOSIS — Z23 Encounter for immunization: Secondary | ICD-10-CM | POA: Diagnosis not present

## 2012-11-04 DIAGNOSIS — D509 Iron deficiency anemia, unspecified: Secondary | ICD-10-CM | POA: Diagnosis not present

## 2012-11-04 DIAGNOSIS — Z23 Encounter for immunization: Secondary | ICD-10-CM | POA: Diagnosis not present

## 2012-11-04 DIAGNOSIS — N186 End stage renal disease: Secondary | ICD-10-CM | POA: Diagnosis not present

## 2012-11-05 DIAGNOSIS — Z23 Encounter for immunization: Secondary | ICD-10-CM | POA: Diagnosis not present

## 2012-11-05 DIAGNOSIS — N186 End stage renal disease: Secondary | ICD-10-CM | POA: Diagnosis not present

## 2012-11-05 DIAGNOSIS — D509 Iron deficiency anemia, unspecified: Secondary | ICD-10-CM | POA: Diagnosis not present

## 2012-11-06 DIAGNOSIS — D509 Iron deficiency anemia, unspecified: Secondary | ICD-10-CM | POA: Diagnosis not present

## 2012-11-06 DIAGNOSIS — N186 End stage renal disease: Secondary | ICD-10-CM | POA: Diagnosis not present

## 2012-11-06 DIAGNOSIS — Z23 Encounter for immunization: Secondary | ICD-10-CM | POA: Diagnosis not present

## 2012-11-07 DIAGNOSIS — D509 Iron deficiency anemia, unspecified: Secondary | ICD-10-CM | POA: Diagnosis not present

## 2012-11-07 DIAGNOSIS — Z23 Encounter for immunization: Secondary | ICD-10-CM | POA: Diagnosis not present

## 2012-11-07 DIAGNOSIS — N186 End stage renal disease: Secondary | ICD-10-CM | POA: Diagnosis not present

## 2012-11-08 DIAGNOSIS — Z23 Encounter for immunization: Secondary | ICD-10-CM | POA: Diagnosis not present

## 2012-11-08 DIAGNOSIS — N186 End stage renal disease: Secondary | ICD-10-CM | POA: Diagnosis not present

## 2012-11-08 DIAGNOSIS — D509 Iron deficiency anemia, unspecified: Secondary | ICD-10-CM | POA: Diagnosis not present

## 2012-11-09 DIAGNOSIS — D509 Iron deficiency anemia, unspecified: Secondary | ICD-10-CM | POA: Diagnosis not present

## 2012-11-09 DIAGNOSIS — N186 End stage renal disease: Secondary | ICD-10-CM | POA: Diagnosis not present

## 2012-11-09 DIAGNOSIS — Z23 Encounter for immunization: Secondary | ICD-10-CM | POA: Diagnosis not present

## 2012-11-10 DIAGNOSIS — N186 End stage renal disease: Secondary | ICD-10-CM | POA: Diagnosis not present

## 2012-11-10 DIAGNOSIS — Z23 Encounter for immunization: Secondary | ICD-10-CM | POA: Diagnosis not present

## 2012-11-10 DIAGNOSIS — D509 Iron deficiency anemia, unspecified: Secondary | ICD-10-CM | POA: Diagnosis not present

## 2012-11-11 DIAGNOSIS — D509 Iron deficiency anemia, unspecified: Secondary | ICD-10-CM | POA: Diagnosis not present

## 2012-11-11 DIAGNOSIS — N186 End stage renal disease: Secondary | ICD-10-CM | POA: Diagnosis not present

## 2012-11-11 DIAGNOSIS — Z23 Encounter for immunization: Secondary | ICD-10-CM | POA: Diagnosis not present

## 2012-11-12 DIAGNOSIS — Z23 Encounter for immunization: Secondary | ICD-10-CM | POA: Diagnosis not present

## 2012-11-12 DIAGNOSIS — D509 Iron deficiency anemia, unspecified: Secondary | ICD-10-CM | POA: Diagnosis not present

## 2012-11-12 DIAGNOSIS — N186 End stage renal disease: Secondary | ICD-10-CM | POA: Diagnosis not present

## 2012-11-13 DIAGNOSIS — N186 End stage renal disease: Secondary | ICD-10-CM | POA: Diagnosis not present

## 2012-11-13 DIAGNOSIS — D509 Iron deficiency anemia, unspecified: Secondary | ICD-10-CM | POA: Diagnosis not present

## 2012-11-13 DIAGNOSIS — Z23 Encounter for immunization: Secondary | ICD-10-CM | POA: Diagnosis not present

## 2012-11-14 DIAGNOSIS — H905 Unspecified sensorineural hearing loss: Secondary | ICD-10-CM | POA: Diagnosis not present

## 2012-11-14 DIAGNOSIS — Z23 Encounter for immunization: Secondary | ICD-10-CM | POA: Diagnosis not present

## 2012-11-14 DIAGNOSIS — D509 Iron deficiency anemia, unspecified: Secondary | ICD-10-CM | POA: Diagnosis not present

## 2012-11-14 DIAGNOSIS — E041 Nontoxic single thyroid nodule: Secondary | ICD-10-CM | POA: Diagnosis not present

## 2012-11-14 DIAGNOSIS — N186 End stage renal disease: Secondary | ICD-10-CM | POA: Diagnosis not present

## 2012-11-15 DIAGNOSIS — N186 End stage renal disease: Secondary | ICD-10-CM | POA: Diagnosis not present

## 2012-11-15 DIAGNOSIS — Z23 Encounter for immunization: Secondary | ICD-10-CM | POA: Diagnosis not present

## 2012-11-15 DIAGNOSIS — D509 Iron deficiency anemia, unspecified: Secondary | ICD-10-CM | POA: Diagnosis not present

## 2012-11-16 DIAGNOSIS — D509 Iron deficiency anemia, unspecified: Secondary | ICD-10-CM | POA: Diagnosis not present

## 2012-11-16 DIAGNOSIS — Z23 Encounter for immunization: Secondary | ICD-10-CM | POA: Diagnosis not present

## 2012-11-16 DIAGNOSIS — N186 End stage renal disease: Secondary | ICD-10-CM | POA: Diagnosis not present

## 2012-11-17 DIAGNOSIS — D509 Iron deficiency anemia, unspecified: Secondary | ICD-10-CM | POA: Diagnosis not present

## 2012-11-17 DIAGNOSIS — N186 End stage renal disease: Secondary | ICD-10-CM | POA: Diagnosis not present

## 2012-11-17 DIAGNOSIS — Z23 Encounter for immunization: Secondary | ICD-10-CM | POA: Diagnosis not present

## 2012-11-18 DIAGNOSIS — N186 End stage renal disease: Secondary | ICD-10-CM | POA: Diagnosis not present

## 2012-11-18 DIAGNOSIS — Z23 Encounter for immunization: Secondary | ICD-10-CM | POA: Diagnosis not present

## 2012-11-18 DIAGNOSIS — D509 Iron deficiency anemia, unspecified: Secondary | ICD-10-CM | POA: Diagnosis not present

## 2012-11-19 DIAGNOSIS — N186 End stage renal disease: Secondary | ICD-10-CM | POA: Diagnosis not present

## 2012-11-19 DIAGNOSIS — D509 Iron deficiency anemia, unspecified: Secondary | ICD-10-CM | POA: Diagnosis not present

## 2012-11-19 DIAGNOSIS — Z23 Encounter for immunization: Secondary | ICD-10-CM | POA: Diagnosis not present

## 2012-11-20 DIAGNOSIS — D509 Iron deficiency anemia, unspecified: Secondary | ICD-10-CM | POA: Diagnosis not present

## 2012-11-20 DIAGNOSIS — Z23 Encounter for immunization: Secondary | ICD-10-CM | POA: Diagnosis not present

## 2012-11-20 DIAGNOSIS — N186 End stage renal disease: Secondary | ICD-10-CM | POA: Diagnosis not present

## 2012-11-21 ENCOUNTER — Ambulatory Visit: Payer: Self-pay | Admitting: Nephrology

## 2012-11-21 DIAGNOSIS — Z23 Encounter for immunization: Secondary | ICD-10-CM | POA: Diagnosis not present

## 2012-11-21 DIAGNOSIS — M899 Disorder of bone, unspecified: Secondary | ICD-10-CM | POA: Diagnosis not present

## 2012-11-21 DIAGNOSIS — R188 Other ascites: Secondary | ICD-10-CM | POA: Diagnosis not present

## 2012-11-21 DIAGNOSIS — R1012 Left upper quadrant pain: Secondary | ICD-10-CM | POA: Diagnosis not present

## 2012-11-21 DIAGNOSIS — D509 Iron deficiency anemia, unspecified: Secondary | ICD-10-CM | POA: Diagnosis not present

## 2012-11-21 DIAGNOSIS — N186 End stage renal disease: Secondary | ICD-10-CM | POA: Diagnosis not present

## 2012-11-21 DIAGNOSIS — R1032 Left lower quadrant pain: Secondary | ICD-10-CM | POA: Diagnosis not present

## 2012-11-25 ENCOUNTER — Ambulatory Visit: Payer: Self-pay | Admitting: Vascular Surgery

## 2012-11-25 DIAGNOSIS — T82898A Other specified complication of vascular prosthetic devices, implants and grafts, initial encounter: Secondary | ICD-10-CM | POA: Diagnosis not present

## 2012-11-25 DIAGNOSIS — Z992 Dependence on renal dialysis: Secondary | ICD-10-CM | POA: Diagnosis not present

## 2012-11-25 DIAGNOSIS — J438 Other emphysema: Secondary | ICD-10-CM | POA: Diagnosis not present

## 2012-11-25 DIAGNOSIS — Z885 Allergy status to narcotic agent status: Secondary | ICD-10-CM | POA: Diagnosis not present

## 2012-11-25 DIAGNOSIS — Z853 Personal history of malignant neoplasm of breast: Secondary | ICD-10-CM | POA: Diagnosis not present

## 2012-11-25 DIAGNOSIS — F17200 Nicotine dependence, unspecified, uncomplicated: Secondary | ICD-10-CM | POA: Diagnosis not present

## 2012-11-25 DIAGNOSIS — Z7982 Long term (current) use of aspirin: Secondary | ICD-10-CM | POA: Diagnosis not present

## 2012-11-25 DIAGNOSIS — I10 Essential (primary) hypertension: Secondary | ICD-10-CM | POA: Diagnosis not present

## 2012-11-25 DIAGNOSIS — Z8489 Family history of other specified conditions: Secondary | ICD-10-CM | POA: Diagnosis not present

## 2012-11-25 DIAGNOSIS — J449 Chronic obstructive pulmonary disease, unspecified: Secondary | ICD-10-CM | POA: Diagnosis not present

## 2012-11-25 DIAGNOSIS — Z96659 Presence of unspecified artificial knee joint: Secondary | ICD-10-CM | POA: Diagnosis not present

## 2012-11-25 DIAGNOSIS — R011 Cardiac murmur, unspecified: Secondary | ICD-10-CM | POA: Diagnosis not present

## 2012-11-25 DIAGNOSIS — Z833 Family history of diabetes mellitus: Secondary | ICD-10-CM | POA: Diagnosis not present

## 2012-11-25 DIAGNOSIS — E785 Hyperlipidemia, unspecified: Secondary | ICD-10-CM | POA: Diagnosis not present

## 2012-11-25 DIAGNOSIS — I120 Hypertensive chronic kidney disease with stage 5 chronic kidney disease or end stage renal disease: Secondary | ICD-10-CM | POA: Diagnosis not present

## 2012-11-25 DIAGNOSIS — R609 Edema, unspecified: Secondary | ICD-10-CM | POA: Diagnosis not present

## 2012-11-25 DIAGNOSIS — N186 End stage renal disease: Secondary | ICD-10-CM | POA: Diagnosis not present

## 2012-11-25 DIAGNOSIS — Z79899 Other long term (current) drug therapy: Secondary | ICD-10-CM | POA: Diagnosis not present

## 2012-11-25 LAB — POTASSIUM: Potassium: 4.9 mmol/L (ref 3.5–5.1)

## 2012-11-26 DIAGNOSIS — Z23 Encounter for immunization: Secondary | ICD-10-CM | POA: Diagnosis not present

## 2012-11-26 DIAGNOSIS — D509 Iron deficiency anemia, unspecified: Secondary | ICD-10-CM | POA: Diagnosis not present

## 2012-11-26 DIAGNOSIS — N186 End stage renal disease: Secondary | ICD-10-CM | POA: Diagnosis not present

## 2012-11-27 DIAGNOSIS — N186 End stage renal disease: Secondary | ICD-10-CM | POA: Diagnosis not present

## 2012-11-27 DIAGNOSIS — Z23 Encounter for immunization: Secondary | ICD-10-CM | POA: Diagnosis not present

## 2012-11-27 DIAGNOSIS — D509 Iron deficiency anemia, unspecified: Secondary | ICD-10-CM | POA: Diagnosis not present

## 2012-11-28 DIAGNOSIS — D509 Iron deficiency anemia, unspecified: Secondary | ICD-10-CM | POA: Diagnosis not present

## 2012-11-28 DIAGNOSIS — N186 End stage renal disease: Secondary | ICD-10-CM | POA: Diagnosis not present

## 2012-11-28 DIAGNOSIS — Z23 Encounter for immunization: Secondary | ICD-10-CM | POA: Diagnosis not present

## 2012-11-29 DIAGNOSIS — Z23 Encounter for immunization: Secondary | ICD-10-CM | POA: Diagnosis not present

## 2012-11-29 DIAGNOSIS — D509 Iron deficiency anemia, unspecified: Secondary | ICD-10-CM | POA: Diagnosis not present

## 2012-11-29 DIAGNOSIS — N186 End stage renal disease: Secondary | ICD-10-CM | POA: Diagnosis not present

## 2012-11-30 DIAGNOSIS — Z23 Encounter for immunization: Secondary | ICD-10-CM | POA: Diagnosis not present

## 2012-11-30 DIAGNOSIS — N186 End stage renal disease: Secondary | ICD-10-CM | POA: Diagnosis not present

## 2012-11-30 DIAGNOSIS — D509 Iron deficiency anemia, unspecified: Secondary | ICD-10-CM | POA: Diagnosis not present

## 2012-12-01 DIAGNOSIS — Z23 Encounter for immunization: Secondary | ICD-10-CM | POA: Diagnosis not present

## 2012-12-01 DIAGNOSIS — D509 Iron deficiency anemia, unspecified: Secondary | ICD-10-CM | POA: Diagnosis not present

## 2012-12-01 DIAGNOSIS — N186 End stage renal disease: Secondary | ICD-10-CM | POA: Diagnosis not present

## 2012-12-02 DIAGNOSIS — Z23 Encounter for immunization: Secondary | ICD-10-CM | POA: Diagnosis not present

## 2012-12-02 DIAGNOSIS — N186 End stage renal disease: Secondary | ICD-10-CM | POA: Diagnosis not present

## 2012-12-02 DIAGNOSIS — D509 Iron deficiency anemia, unspecified: Secondary | ICD-10-CM | POA: Diagnosis not present

## 2012-12-03 DIAGNOSIS — N186 End stage renal disease: Secondary | ICD-10-CM | POA: Diagnosis not present

## 2012-12-03 DIAGNOSIS — D509 Iron deficiency anemia, unspecified: Secondary | ICD-10-CM | POA: Diagnosis not present

## 2012-12-03 DIAGNOSIS — Z23 Encounter for immunization: Secondary | ICD-10-CM | POA: Diagnosis not present

## 2012-12-04 DIAGNOSIS — N2581 Secondary hyperparathyroidism of renal origin: Secondary | ICD-10-CM | POA: Diagnosis not present

## 2012-12-04 DIAGNOSIS — D509 Iron deficiency anemia, unspecified: Secondary | ICD-10-CM | POA: Diagnosis not present

## 2012-12-04 DIAGNOSIS — N186 End stage renal disease: Secondary | ICD-10-CM | POA: Diagnosis not present

## 2012-12-04 DIAGNOSIS — Z992 Dependence on renal dialysis: Secondary | ICD-10-CM | POA: Diagnosis not present

## 2012-12-24 DIAGNOSIS — M5380 Other specified dorsopathies, site unspecified: Secondary | ICD-10-CM | POA: Diagnosis not present

## 2012-12-24 DIAGNOSIS — M999 Biomechanical lesion, unspecified: Secondary | ICD-10-CM | POA: Diagnosis not present

## 2012-12-24 DIAGNOSIS — T82898A Other specified complication of vascular prosthetic devices, implants and grafts, initial encounter: Secondary | ICD-10-CM | POA: Diagnosis not present

## 2012-12-24 DIAGNOSIS — N186 End stage renal disease: Secondary | ICD-10-CM | POA: Diagnosis not present

## 2012-12-26 DIAGNOSIS — M999 Biomechanical lesion, unspecified: Secondary | ICD-10-CM | POA: Diagnosis not present

## 2012-12-26 DIAGNOSIS — M5380 Other specified dorsopathies, site unspecified: Secondary | ICD-10-CM | POA: Diagnosis not present

## 2013-01-04 DIAGNOSIS — D509 Iron deficiency anemia, unspecified: Secondary | ICD-10-CM | POA: Diagnosis not present

## 2013-01-04 DIAGNOSIS — N2581 Secondary hyperparathyroidism of renal origin: Secondary | ICD-10-CM | POA: Diagnosis not present

## 2013-01-04 DIAGNOSIS — N186 End stage renal disease: Secondary | ICD-10-CM | POA: Diagnosis not present

## 2013-01-04 DIAGNOSIS — Z992 Dependence on renal dialysis: Secondary | ICD-10-CM | POA: Diagnosis not present

## 2013-01-05 DIAGNOSIS — D509 Iron deficiency anemia, unspecified: Secondary | ICD-10-CM | POA: Diagnosis not present

## 2013-01-05 DIAGNOSIS — Z992 Dependence on renal dialysis: Secondary | ICD-10-CM | POA: Diagnosis not present

## 2013-01-05 DIAGNOSIS — N186 End stage renal disease: Secondary | ICD-10-CM | POA: Diagnosis not present

## 2013-01-05 DIAGNOSIS — N2581 Secondary hyperparathyroidism of renal origin: Secondary | ICD-10-CM | POA: Diagnosis not present

## 2013-01-06 DIAGNOSIS — Z992 Dependence on renal dialysis: Secondary | ICD-10-CM | POA: Diagnosis not present

## 2013-01-06 DIAGNOSIS — D509 Iron deficiency anemia, unspecified: Secondary | ICD-10-CM | POA: Diagnosis not present

## 2013-01-06 DIAGNOSIS — N186 End stage renal disease: Secondary | ICD-10-CM | POA: Diagnosis not present

## 2013-01-06 DIAGNOSIS — N2581 Secondary hyperparathyroidism of renal origin: Secondary | ICD-10-CM | POA: Diagnosis not present

## 2013-01-07 DIAGNOSIS — Z992 Dependence on renal dialysis: Secondary | ICD-10-CM | POA: Diagnosis not present

## 2013-01-07 DIAGNOSIS — N2581 Secondary hyperparathyroidism of renal origin: Secondary | ICD-10-CM | POA: Diagnosis not present

## 2013-01-07 DIAGNOSIS — D509 Iron deficiency anemia, unspecified: Secondary | ICD-10-CM | POA: Diagnosis not present

## 2013-01-07 DIAGNOSIS — N186 End stage renal disease: Secondary | ICD-10-CM | POA: Diagnosis not present

## 2013-01-08 DIAGNOSIS — Z992 Dependence on renal dialysis: Secondary | ICD-10-CM | POA: Diagnosis not present

## 2013-01-08 DIAGNOSIS — N186 End stage renal disease: Secondary | ICD-10-CM | POA: Diagnosis not present

## 2013-01-08 DIAGNOSIS — D509 Iron deficiency anemia, unspecified: Secondary | ICD-10-CM | POA: Diagnosis not present

## 2013-01-08 DIAGNOSIS — N2581 Secondary hyperparathyroidism of renal origin: Secondary | ICD-10-CM | POA: Diagnosis not present

## 2013-01-09 ENCOUNTER — Other Ambulatory Visit: Payer: Self-pay

## 2013-01-09 DIAGNOSIS — N186 End stage renal disease: Secondary | ICD-10-CM | POA: Diagnosis not present

## 2013-01-09 DIAGNOSIS — N2581 Secondary hyperparathyroidism of renal origin: Secondary | ICD-10-CM | POA: Diagnosis not present

## 2013-01-09 DIAGNOSIS — D509 Iron deficiency anemia, unspecified: Secondary | ICD-10-CM | POA: Diagnosis not present

## 2013-01-09 DIAGNOSIS — Z992 Dependence on renal dialysis: Secondary | ICD-10-CM | POA: Diagnosis not present

## 2013-01-10 DIAGNOSIS — Z992 Dependence on renal dialysis: Secondary | ICD-10-CM | POA: Diagnosis not present

## 2013-01-10 DIAGNOSIS — N186 End stage renal disease: Secondary | ICD-10-CM | POA: Diagnosis not present

## 2013-01-10 DIAGNOSIS — N2581 Secondary hyperparathyroidism of renal origin: Secondary | ICD-10-CM | POA: Diagnosis not present

## 2013-01-10 DIAGNOSIS — D509 Iron deficiency anemia, unspecified: Secondary | ICD-10-CM | POA: Diagnosis not present

## 2013-01-11 DIAGNOSIS — Z992 Dependence on renal dialysis: Secondary | ICD-10-CM | POA: Diagnosis not present

## 2013-01-11 DIAGNOSIS — N186 End stage renal disease: Secondary | ICD-10-CM | POA: Diagnosis not present

## 2013-01-11 DIAGNOSIS — N2581 Secondary hyperparathyroidism of renal origin: Secondary | ICD-10-CM | POA: Diagnosis not present

## 2013-01-11 DIAGNOSIS — D509 Iron deficiency anemia, unspecified: Secondary | ICD-10-CM | POA: Diagnosis not present

## 2013-01-12 DIAGNOSIS — Z992 Dependence on renal dialysis: Secondary | ICD-10-CM | POA: Diagnosis not present

## 2013-01-12 DIAGNOSIS — D509 Iron deficiency anemia, unspecified: Secondary | ICD-10-CM | POA: Diagnosis not present

## 2013-01-12 DIAGNOSIS — N186 End stage renal disease: Secondary | ICD-10-CM | POA: Diagnosis not present

## 2013-01-12 DIAGNOSIS — N2581 Secondary hyperparathyroidism of renal origin: Secondary | ICD-10-CM | POA: Diagnosis not present

## 2013-01-13 DIAGNOSIS — D509 Iron deficiency anemia, unspecified: Secondary | ICD-10-CM | POA: Diagnosis not present

## 2013-01-13 DIAGNOSIS — N2581 Secondary hyperparathyroidism of renal origin: Secondary | ICD-10-CM | POA: Diagnosis not present

## 2013-01-13 DIAGNOSIS — N186 End stage renal disease: Secondary | ICD-10-CM | POA: Diagnosis not present

## 2013-01-13 DIAGNOSIS — Z992 Dependence on renal dialysis: Secondary | ICD-10-CM | POA: Diagnosis not present

## 2013-01-14 DIAGNOSIS — Z992 Dependence on renal dialysis: Secondary | ICD-10-CM | POA: Diagnosis not present

## 2013-01-14 DIAGNOSIS — N2581 Secondary hyperparathyroidism of renal origin: Secondary | ICD-10-CM | POA: Diagnosis not present

## 2013-01-14 DIAGNOSIS — N186 End stage renal disease: Secondary | ICD-10-CM | POA: Diagnosis not present

## 2013-01-14 DIAGNOSIS — D509 Iron deficiency anemia, unspecified: Secondary | ICD-10-CM | POA: Diagnosis not present

## 2013-01-15 DIAGNOSIS — N186 End stage renal disease: Secondary | ICD-10-CM | POA: Diagnosis not present

## 2013-01-15 DIAGNOSIS — N2581 Secondary hyperparathyroidism of renal origin: Secondary | ICD-10-CM | POA: Diagnosis not present

## 2013-01-15 DIAGNOSIS — Z992 Dependence on renal dialysis: Secondary | ICD-10-CM | POA: Diagnosis not present

## 2013-01-15 DIAGNOSIS — D509 Iron deficiency anemia, unspecified: Secondary | ICD-10-CM | POA: Diagnosis not present

## 2013-01-16 DIAGNOSIS — N2581 Secondary hyperparathyroidism of renal origin: Secondary | ICD-10-CM | POA: Diagnosis not present

## 2013-01-16 DIAGNOSIS — D509 Iron deficiency anemia, unspecified: Secondary | ICD-10-CM | POA: Diagnosis not present

## 2013-01-16 DIAGNOSIS — N186 End stage renal disease: Secondary | ICD-10-CM | POA: Diagnosis not present

## 2013-01-16 DIAGNOSIS — Z992 Dependence on renal dialysis: Secondary | ICD-10-CM | POA: Diagnosis not present

## 2013-01-17 DIAGNOSIS — D509 Iron deficiency anemia, unspecified: Secondary | ICD-10-CM | POA: Diagnosis not present

## 2013-01-17 DIAGNOSIS — N2581 Secondary hyperparathyroidism of renal origin: Secondary | ICD-10-CM | POA: Diagnosis not present

## 2013-01-17 DIAGNOSIS — N186 End stage renal disease: Secondary | ICD-10-CM | POA: Diagnosis not present

## 2013-01-17 DIAGNOSIS — Z992 Dependence on renal dialysis: Secondary | ICD-10-CM | POA: Diagnosis not present

## 2013-01-18 DIAGNOSIS — D509 Iron deficiency anemia, unspecified: Secondary | ICD-10-CM | POA: Diagnosis not present

## 2013-01-18 DIAGNOSIS — N2581 Secondary hyperparathyroidism of renal origin: Secondary | ICD-10-CM | POA: Diagnosis not present

## 2013-01-18 DIAGNOSIS — Z992 Dependence on renal dialysis: Secondary | ICD-10-CM | POA: Diagnosis not present

## 2013-01-18 DIAGNOSIS — N186 End stage renal disease: Secondary | ICD-10-CM | POA: Diagnosis not present

## 2013-01-19 DIAGNOSIS — Z992 Dependence on renal dialysis: Secondary | ICD-10-CM | POA: Diagnosis not present

## 2013-01-19 DIAGNOSIS — N186 End stage renal disease: Secondary | ICD-10-CM | POA: Diagnosis not present

## 2013-01-19 DIAGNOSIS — D509 Iron deficiency anemia, unspecified: Secondary | ICD-10-CM | POA: Diagnosis not present

## 2013-01-19 DIAGNOSIS — N2581 Secondary hyperparathyroidism of renal origin: Secondary | ICD-10-CM | POA: Diagnosis not present

## 2013-01-20 DIAGNOSIS — N186 End stage renal disease: Secondary | ICD-10-CM | POA: Diagnosis not present

## 2013-01-20 DIAGNOSIS — D509 Iron deficiency anemia, unspecified: Secondary | ICD-10-CM | POA: Diagnosis not present

## 2013-01-20 DIAGNOSIS — Z992 Dependence on renal dialysis: Secondary | ICD-10-CM | POA: Diagnosis not present

## 2013-01-20 DIAGNOSIS — N2581 Secondary hyperparathyroidism of renal origin: Secondary | ICD-10-CM | POA: Diagnosis not present

## 2013-01-21 DIAGNOSIS — N186 End stage renal disease: Secondary | ICD-10-CM | POA: Diagnosis not present

## 2013-01-21 DIAGNOSIS — N2581 Secondary hyperparathyroidism of renal origin: Secondary | ICD-10-CM | POA: Diagnosis not present

## 2013-01-21 DIAGNOSIS — Z992 Dependence on renal dialysis: Secondary | ICD-10-CM | POA: Diagnosis not present

## 2013-01-21 DIAGNOSIS — D509 Iron deficiency anemia, unspecified: Secondary | ICD-10-CM | POA: Diagnosis not present

## 2013-01-22 DIAGNOSIS — N186 End stage renal disease: Secondary | ICD-10-CM | POA: Diagnosis not present

## 2013-01-22 DIAGNOSIS — D509 Iron deficiency anemia, unspecified: Secondary | ICD-10-CM | POA: Diagnosis not present

## 2013-01-22 DIAGNOSIS — N2581 Secondary hyperparathyroidism of renal origin: Secondary | ICD-10-CM | POA: Diagnosis not present

## 2013-01-22 DIAGNOSIS — Z992 Dependence on renal dialysis: Secondary | ICD-10-CM | POA: Diagnosis not present

## 2013-01-23 ENCOUNTER — Ambulatory Visit: Payer: Self-pay | Admitting: Oncology

## 2013-01-23 DIAGNOSIS — N2581 Secondary hyperparathyroidism of renal origin: Secondary | ICD-10-CM | POA: Diagnosis not present

## 2013-01-23 DIAGNOSIS — Z853 Personal history of malignant neoplasm of breast: Secondary | ICD-10-CM | POA: Diagnosis not present

## 2013-01-23 DIAGNOSIS — F329 Major depressive disorder, single episode, unspecified: Secondary | ICD-10-CM | POA: Diagnosis not present

## 2013-01-23 DIAGNOSIS — Z79899 Other long term (current) drug therapy: Secondary | ICD-10-CM | POA: Diagnosis not present

## 2013-01-23 DIAGNOSIS — Z7982 Long term (current) use of aspirin: Secondary | ICD-10-CM | POA: Diagnosis not present

## 2013-01-23 DIAGNOSIS — F411 Generalized anxiety disorder: Secondary | ICD-10-CM | POA: Diagnosis not present

## 2013-01-23 DIAGNOSIS — M899 Disorder of bone, unspecified: Secondary | ICD-10-CM | POA: Diagnosis not present

## 2013-01-23 DIAGNOSIS — Z9223 Personal history of estrogen therapy: Secondary | ICD-10-CM | POA: Diagnosis not present

## 2013-01-23 DIAGNOSIS — I10 Essential (primary) hypertension: Secondary | ICD-10-CM | POA: Diagnosis not present

## 2013-01-23 DIAGNOSIS — N186 End stage renal disease: Secondary | ICD-10-CM | POA: Diagnosis not present

## 2013-01-23 DIAGNOSIS — D509 Iron deficiency anemia, unspecified: Secondary | ICD-10-CM | POA: Diagnosis not present

## 2013-01-23 DIAGNOSIS — Z992 Dependence on renal dialysis: Secondary | ICD-10-CM | POA: Diagnosis not present

## 2013-01-24 DIAGNOSIS — N2581 Secondary hyperparathyroidism of renal origin: Secondary | ICD-10-CM | POA: Diagnosis not present

## 2013-01-24 DIAGNOSIS — N186 End stage renal disease: Secondary | ICD-10-CM | POA: Diagnosis not present

## 2013-01-24 DIAGNOSIS — Z992 Dependence on renal dialysis: Secondary | ICD-10-CM | POA: Diagnosis not present

## 2013-01-24 DIAGNOSIS — D509 Iron deficiency anemia, unspecified: Secondary | ICD-10-CM | POA: Diagnosis not present

## 2013-01-24 LAB — CANCER ANTIGEN 27.29: CA 27.29: 31.8 U/mL (ref 0.0–38.6)

## 2013-01-25 DIAGNOSIS — D509 Iron deficiency anemia, unspecified: Secondary | ICD-10-CM | POA: Diagnosis not present

## 2013-01-25 DIAGNOSIS — N2581 Secondary hyperparathyroidism of renal origin: Secondary | ICD-10-CM | POA: Diagnosis not present

## 2013-01-25 DIAGNOSIS — N186 End stage renal disease: Secondary | ICD-10-CM | POA: Diagnosis not present

## 2013-01-25 DIAGNOSIS — Z992 Dependence on renal dialysis: Secondary | ICD-10-CM | POA: Diagnosis not present

## 2013-01-26 DIAGNOSIS — N186 End stage renal disease: Secondary | ICD-10-CM | POA: Diagnosis not present

## 2013-01-26 DIAGNOSIS — Z992 Dependence on renal dialysis: Secondary | ICD-10-CM | POA: Diagnosis not present

## 2013-01-26 DIAGNOSIS — N2581 Secondary hyperparathyroidism of renal origin: Secondary | ICD-10-CM | POA: Diagnosis not present

## 2013-01-26 DIAGNOSIS — D509 Iron deficiency anemia, unspecified: Secondary | ICD-10-CM | POA: Diagnosis not present

## 2013-01-27 DIAGNOSIS — Z992 Dependence on renal dialysis: Secondary | ICD-10-CM | POA: Diagnosis not present

## 2013-01-27 DIAGNOSIS — D509 Iron deficiency anemia, unspecified: Secondary | ICD-10-CM | POA: Diagnosis not present

## 2013-01-27 DIAGNOSIS — N2581 Secondary hyperparathyroidism of renal origin: Secondary | ICD-10-CM | POA: Diagnosis not present

## 2013-01-27 DIAGNOSIS — N186 End stage renal disease: Secondary | ICD-10-CM | POA: Diagnosis not present

## 2013-01-28 DIAGNOSIS — N2581 Secondary hyperparathyroidism of renal origin: Secondary | ICD-10-CM | POA: Diagnosis not present

## 2013-01-28 DIAGNOSIS — D509 Iron deficiency anemia, unspecified: Secondary | ICD-10-CM | POA: Diagnosis not present

## 2013-01-28 DIAGNOSIS — N186 End stage renal disease: Secondary | ICD-10-CM | POA: Diagnosis not present

## 2013-01-28 DIAGNOSIS — Z992 Dependence on renal dialysis: Secondary | ICD-10-CM | POA: Diagnosis not present

## 2013-01-29 DIAGNOSIS — Z992 Dependence on renal dialysis: Secondary | ICD-10-CM | POA: Diagnosis not present

## 2013-01-29 DIAGNOSIS — N186 End stage renal disease: Secondary | ICD-10-CM | POA: Diagnosis not present

## 2013-01-29 DIAGNOSIS — N2581 Secondary hyperparathyroidism of renal origin: Secondary | ICD-10-CM | POA: Diagnosis not present

## 2013-01-29 DIAGNOSIS — D509 Iron deficiency anemia, unspecified: Secondary | ICD-10-CM | POA: Diagnosis not present

## 2013-01-30 DIAGNOSIS — N186 End stage renal disease: Secondary | ICD-10-CM | POA: Diagnosis not present

## 2013-01-30 DIAGNOSIS — Z992 Dependence on renal dialysis: Secondary | ICD-10-CM | POA: Diagnosis not present

## 2013-01-30 DIAGNOSIS — D509 Iron deficiency anemia, unspecified: Secondary | ICD-10-CM | POA: Diagnosis not present

## 2013-01-30 DIAGNOSIS — N2581 Secondary hyperparathyroidism of renal origin: Secondary | ICD-10-CM | POA: Diagnosis not present

## 2013-01-31 DIAGNOSIS — Z992 Dependence on renal dialysis: Secondary | ICD-10-CM | POA: Diagnosis not present

## 2013-01-31 DIAGNOSIS — N186 End stage renal disease: Secondary | ICD-10-CM | POA: Diagnosis not present

## 2013-01-31 DIAGNOSIS — N2581 Secondary hyperparathyroidism of renal origin: Secondary | ICD-10-CM | POA: Diagnosis not present

## 2013-01-31 DIAGNOSIS — D509 Iron deficiency anemia, unspecified: Secondary | ICD-10-CM | POA: Diagnosis not present

## 2013-02-01 DIAGNOSIS — D509 Iron deficiency anemia, unspecified: Secondary | ICD-10-CM | POA: Diagnosis not present

## 2013-02-01 DIAGNOSIS — N186 End stage renal disease: Secondary | ICD-10-CM | POA: Diagnosis not present

## 2013-02-01 DIAGNOSIS — N2581 Secondary hyperparathyroidism of renal origin: Secondary | ICD-10-CM | POA: Diagnosis not present

## 2013-02-01 DIAGNOSIS — Z992 Dependence on renal dialysis: Secondary | ICD-10-CM | POA: Diagnosis not present

## 2013-02-02 DIAGNOSIS — D509 Iron deficiency anemia, unspecified: Secondary | ICD-10-CM | POA: Diagnosis not present

## 2013-02-02 DIAGNOSIS — N186 End stage renal disease: Secondary | ICD-10-CM | POA: Diagnosis not present

## 2013-02-02 DIAGNOSIS — Z992 Dependence on renal dialysis: Secondary | ICD-10-CM | POA: Diagnosis not present

## 2013-02-02 DIAGNOSIS — N2581 Secondary hyperparathyroidism of renal origin: Secondary | ICD-10-CM | POA: Diagnosis not present

## 2013-02-03 ENCOUNTER — Ambulatory Visit: Payer: Self-pay | Admitting: Oncology

## 2013-02-03 DIAGNOSIS — N186 End stage renal disease: Secondary | ICD-10-CM | POA: Diagnosis not present

## 2013-02-03 DIAGNOSIS — Z992 Dependence on renal dialysis: Secondary | ICD-10-CM | POA: Diagnosis not present

## 2013-02-03 DIAGNOSIS — D509 Iron deficiency anemia, unspecified: Secondary | ICD-10-CM | POA: Diagnosis not present

## 2013-02-21 DIAGNOSIS — J449 Chronic obstructive pulmonary disease, unspecified: Secondary | ICD-10-CM | POA: Diagnosis not present

## 2013-02-21 DIAGNOSIS — F329 Major depressive disorder, single episode, unspecified: Secondary | ICD-10-CM | POA: Diagnosis not present

## 2013-02-21 DIAGNOSIS — I129 Hypertensive chronic kidney disease with stage 1 through stage 4 chronic kidney disease, or unspecified chronic kidney disease: Secondary | ICD-10-CM | POA: Diagnosis not present

## 2013-03-06 DIAGNOSIS — Z23 Encounter for immunization: Secondary | ICD-10-CM | POA: Diagnosis not present

## 2013-03-06 DIAGNOSIS — D509 Iron deficiency anemia, unspecified: Secondary | ICD-10-CM | POA: Diagnosis not present

## 2013-03-06 DIAGNOSIS — N186 End stage renal disease: Secondary | ICD-10-CM | POA: Diagnosis not present

## 2013-03-06 DIAGNOSIS — E559 Vitamin D deficiency, unspecified: Secondary | ICD-10-CM | POA: Diagnosis not present

## 2013-03-06 DIAGNOSIS — N2581 Secondary hyperparathyroidism of renal origin: Secondary | ICD-10-CM | POA: Diagnosis not present

## 2013-04-06 DIAGNOSIS — N186 End stage renal disease: Secondary | ICD-10-CM | POA: Diagnosis not present

## 2013-04-06 DIAGNOSIS — D509 Iron deficiency anemia, unspecified: Secondary | ICD-10-CM | POA: Diagnosis not present

## 2013-04-06 DIAGNOSIS — Z992 Dependence on renal dialysis: Secondary | ICD-10-CM | POA: Diagnosis not present

## 2013-05-04 DIAGNOSIS — N186 End stage renal disease: Secondary | ICD-10-CM | POA: Diagnosis not present

## 2013-05-04 DIAGNOSIS — D509 Iron deficiency anemia, unspecified: Secondary | ICD-10-CM | POA: Diagnosis not present

## 2013-05-04 DIAGNOSIS — Z992 Dependence on renal dialysis: Secondary | ICD-10-CM | POA: Diagnosis not present

## 2013-05-15 ENCOUNTER — Ambulatory Visit: Payer: Self-pay | Admitting: Otolaryngology

## 2013-05-15 DIAGNOSIS — E042 Nontoxic multinodular goiter: Secondary | ICD-10-CM | POA: Diagnosis not present

## 2013-05-15 DIAGNOSIS — E041 Nontoxic single thyroid nodule: Secondary | ICD-10-CM | POA: Diagnosis not present

## 2013-05-19 DIAGNOSIS — H903 Sensorineural hearing loss, bilateral: Secondary | ICD-10-CM | POA: Diagnosis not present

## 2013-05-19 DIAGNOSIS — E041 Nontoxic single thyroid nodule: Secondary | ICD-10-CM | POA: Diagnosis not present

## 2013-05-19 DIAGNOSIS — H905 Unspecified sensorineural hearing loss: Secondary | ICD-10-CM | POA: Diagnosis not present

## 2013-06-04 DIAGNOSIS — N186 End stage renal disease: Secondary | ICD-10-CM | POA: Diagnosis not present

## 2013-06-04 DIAGNOSIS — D509 Iron deficiency anemia, unspecified: Secondary | ICD-10-CM | POA: Diagnosis not present

## 2013-06-04 DIAGNOSIS — N2581 Secondary hyperparathyroidism of renal origin: Secondary | ICD-10-CM | POA: Diagnosis not present

## 2013-06-16 DIAGNOSIS — E041 Nontoxic single thyroid nodule: Secondary | ICD-10-CM | POA: Diagnosis not present

## 2013-07-04 DIAGNOSIS — N186 End stage renal disease: Secondary | ICD-10-CM | POA: Diagnosis not present

## 2013-07-04 DIAGNOSIS — D509 Iron deficiency anemia, unspecified: Secondary | ICD-10-CM | POA: Diagnosis not present

## 2013-07-17 ENCOUNTER — Encounter: Payer: Self-pay | Admitting: General Surgery

## 2013-07-17 DIAGNOSIS — Z09 Encounter for follow-up examination after completed treatment for conditions other than malignant neoplasm: Secondary | ICD-10-CM | POA: Diagnosis not present

## 2013-07-17 DIAGNOSIS — Z9889 Other specified postprocedural states: Secondary | ICD-10-CM | POA: Diagnosis not present

## 2013-07-17 DIAGNOSIS — Z853 Personal history of malignant neoplasm of breast: Secondary | ICD-10-CM | POA: Diagnosis not present

## 2013-07-25 DIAGNOSIS — J449 Chronic obstructive pulmonary disease, unspecified: Secondary | ICD-10-CM | POA: Diagnosis not present

## 2013-07-25 DIAGNOSIS — N19 Unspecified kidney failure: Secondary | ICD-10-CM | POA: Diagnosis not present

## 2013-07-25 DIAGNOSIS — E785 Hyperlipidemia, unspecified: Secondary | ICD-10-CM | POA: Diagnosis not present

## 2013-07-25 DIAGNOSIS — K2100 Gastro-esophageal reflux disease with esophagitis, without bleeding: Secondary | ICD-10-CM | POA: Diagnosis not present

## 2013-07-29 ENCOUNTER — Ambulatory Visit (INDEPENDENT_AMBULATORY_CARE_PROVIDER_SITE_OTHER): Payer: Medicare Other | Admitting: General Surgery

## 2013-07-29 ENCOUNTER — Encounter: Payer: Self-pay | Admitting: General Surgery

## 2013-07-29 VITALS — BP 126/80 | HR 84 | Resp 18 | Ht 67.0 in | Wt 133.0 lb

## 2013-07-29 DIAGNOSIS — Z853 Personal history of malignant neoplasm of breast: Secondary | ICD-10-CM | POA: Diagnosis not present

## 2013-07-29 NOTE — Patient Instructions (Signed)
Patient to return in 1 year with bilateral screening mammogram. Continue self breast exams. Call office for any new breast issues or concerns.  

## 2013-07-29 NOTE — Progress Notes (Signed)
Patient ID: Lydia Clark, female   DOB: 1940/03/31, 73 y.o.   MRN: [id number]  Chief Complaint  Patient presents with  . Follow-up    1 year bilateral diagnostic mammogram     HPI Lydia Clark is a 73 y.o. female who presents for a breast evaluation. The most recent mammogram was done on 07/17/13. Patient does perform regular self breast checks and gets regular mammograms done. The patient denies any new problems with the breasts at this time. The patient states she is not feeling well. She has had a sore throat and fever last week. She is currently on antibiotics. She is currently on peritoneal dialysis.    HPI  Past Medical History  Diagnosis Date  . Allergy   . Cancer 2008    left breast  . Hypertension 2006  . Personal history of tobacco use, presenting hazards to health 2012    30 yrs smoking  . Endocrine disorder 2008    thyroid  . Personal history of malignant neoplasm of breast 2008  . Breast screening, unspecified 2013  . Special screening for malignant neoplasms, colon 2013  . COPD (chronic obstructive pulmonary disease)   . PE (pulmonary embolism)     patient denies  . Swelling of throat     swelling at base of throat,  . Anxiety     anxiety  . Pneumonia     hx  . Heart murmur     child  . History of kidney stones   . GERD (gastroesophageal reflux disease)   . Arthritis   . Multinodular goiter     followed by Dr. Carloyn Manner @ [facility]  . Dialysis patient 2014  . Chronic kidney disease 2014    stage IV chronic     Past Surgical History  Procedure Laterality Date  . Breast surgery Left 2008    lumpectomy  . Fooy Left     lft foot  . Foot surgery  2005  . 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 Tonsillectomy  2005  . Breast biopsy  2005  . Appendectomy  2005  . Replacement total knee Left 2012  . Colonoscopy  2007    done in [place]  . Insertion of dialysis catheter Right 07/02/2012    Procedure: INSERTION OF  DIALYSIS CATHETER;  Surgeon: Elam Dutch, MD;  Location: [facility];  Service: Vascular;  Laterality: Right;  Right Internal Jugular  . Insertion of dialysis catheter Right July 02 2012    right chest/ temporary cath    Family History  Problem Relation Age of Onset  . Cancer Other     breast cancer    Social History History  Substance Use Topics  . Smoking status: Current Every Day Smoker -- 1.00 packs/day for 30 years    Types: Cigarettes  . Smokeless tobacco: Not on file  . Alcohol Use: No    Allergies  Allergen Reactions  . Bee Venom Swelling and Other (See Comments)    Breathing problems  . Biaxin [Clarithromycin] Other (See Comments)    Throat swells  . Mycostatin [Nystatin] Other (See Comments)    Blisters from the ointment  . Sulfa Antibiotics Other (See Comments)    "welps"    Current Outpatient Prescriptions  Medication Sig Dispense Refill  . amLODipine-olmesartan (AZOR) 10-40 MG per tablet Take 1 tablet by mouth daily.      Marland Kitchen aspirin 81 MG tablet Take 81  mg by mouth daily.      . Cholecalciferol (VITAMIN D3) 2000 UNITS TABS Take 1 tablet by mouth daily.      Marland Kitchen levofloxacin (LEVAQUIN) 500 MG tablet Take 500 mg by mouth daily.      Marland Kitchen LORazepam (ATIVAN) 1 MG tablet Take 1 mg by mouth at bedtime as needed for anxiety.       Marland Kitchen omeprazole (PRILOSEC) 20 MG capsule Take 20 mg by mouth daily.      . rosuvastatin (CRESTOR) 10 MG tablet Take 10 mg by mouth daily.      Marland Kitchen tiotropium (SPIRIVA) 18 MCG inhalation capsule Place 18 mcg into inhaler and inhale daily.       No current facility-administered medications for this visit.    Review of Systems Review of Systems  Constitutional: Negative.   Respiratory: Negative.   Cardiovascular: Negative.     Blood pressure 126/80, pulse 84, resp. rate 18, height 5\' 7"  (1.702 m), weight 133 lb (60.328 kg).  Physical Exam Physical Exam  Constitutional: She is oriented to person, place, and time. She appears well-developed  and well-nourished.  Eyes: Conjunctivae are normal. No scleral icterus.  Neck: Neck supple. No thyromegaly present.  2.5 cm mass isthmus/left lobe of thyroid. Benign by biopsy.   Cardiovascular: Normal rate, regular rhythm and normal heart sounds.   No murmur heard. Pulmonary/Chest: Effort normal and breath sounds normal. Right breast exhibits no inverted nipple, no mass, no nipple discharge, no skin change and no tenderness. Left breast exhibits no inverted nipple, no mass, no nipple discharge, no skin change and no tenderness.  Lymphadenopathy:    She has no cervical adenopathy.    She has no axillary adenopathy.  Neurological: She is alert and oriented to person, place, and time.  Skin: Skin is warm and dry.    Data Reviewed  Mammogram reviewed.   Assessment    Exam stable. 7 years post left breast lumpectomy,radiation for stage 1 cancer. Completed 5 years of Femara in 2013.   Thyroid mass being followed by Dr. Pryor Ochoa.  Plan    Patient to return in 1 year with bilateral screening mammogram @ [facility].        Zanobia Griebel G Cleotha Tsang 07/29/2013, 2:06 PM

## 2013-08-04 DIAGNOSIS — N186 End stage renal disease: Secondary | ICD-10-CM | POA: Diagnosis not present

## 2013-08-04 DIAGNOSIS — D509 Iron deficiency anemia, unspecified: Secondary | ICD-10-CM | POA: Diagnosis not present

## 2013-08-05 DIAGNOSIS — D509 Iron deficiency anemia, unspecified: Secondary | ICD-10-CM | POA: Diagnosis not present

## 2013-08-05 DIAGNOSIS — N186 End stage renal disease: Secondary | ICD-10-CM | POA: Diagnosis not present

## 2013-08-06 DIAGNOSIS — N186 End stage renal disease: Secondary | ICD-10-CM | POA: Diagnosis not present

## 2013-08-06 DIAGNOSIS — D509 Iron deficiency anemia, unspecified: Secondary | ICD-10-CM | POA: Diagnosis not present

## 2013-08-07 DIAGNOSIS — N186 End stage renal disease: Secondary | ICD-10-CM | POA: Diagnosis not present

## 2013-08-07 DIAGNOSIS — D509 Iron deficiency anemia, unspecified: Secondary | ICD-10-CM | POA: Diagnosis not present

## 2013-08-08 DIAGNOSIS — N186 End stage renal disease: Secondary | ICD-10-CM | POA: Diagnosis not present

## 2013-08-08 DIAGNOSIS — D509 Iron deficiency anemia, unspecified: Secondary | ICD-10-CM | POA: Diagnosis not present

## 2013-08-09 DIAGNOSIS — D509 Iron deficiency anemia, unspecified: Secondary | ICD-10-CM | POA: Diagnosis not present

## 2013-08-09 DIAGNOSIS — N186 End stage renal disease: Secondary | ICD-10-CM | POA: Diagnosis not present

## 2013-08-10 DIAGNOSIS — D509 Iron deficiency anemia, unspecified: Secondary | ICD-10-CM | POA: Diagnosis not present

## 2013-08-10 DIAGNOSIS — N186 End stage renal disease: Secondary | ICD-10-CM | POA: Diagnosis not present

## 2013-08-11 ENCOUNTER — Emergency Department: Payer: Self-pay | Admitting: Emergency Medicine

## 2013-08-11 DIAGNOSIS — R0602 Shortness of breath: Secondary | ICD-10-CM | POA: Diagnosis not present

## 2013-08-11 DIAGNOSIS — Z888 Allergy status to other drugs, medicaments and biological substances status: Secondary | ICD-10-CM | POA: Diagnosis not present

## 2013-08-11 DIAGNOSIS — D649 Anemia, unspecified: Secondary | ICD-10-CM | POA: Diagnosis not present

## 2013-08-11 DIAGNOSIS — Z79899 Other long term (current) drug therapy: Secondary | ICD-10-CM | POA: Diagnosis not present

## 2013-08-11 DIAGNOSIS — Z9089 Acquired absence of other organs: Secondary | ICD-10-CM | POA: Diagnosis not present

## 2013-08-11 DIAGNOSIS — F3289 Other specified depressive episodes: Secondary | ICD-10-CM | POA: Diagnosis not present

## 2013-08-11 DIAGNOSIS — F329 Major depressive disorder, single episode, unspecified: Secondary | ICD-10-CM | POA: Diagnosis not present

## 2013-08-11 DIAGNOSIS — Z882 Allergy status to sulfonamides status: Secondary | ICD-10-CM | POA: Diagnosis not present

## 2013-08-11 DIAGNOSIS — Z7982 Long term (current) use of aspirin: Secondary | ICD-10-CM | POA: Diagnosis not present

## 2013-08-11 DIAGNOSIS — N186 End stage renal disease: Secondary | ICD-10-CM | POA: Diagnosis not present

## 2013-08-11 DIAGNOSIS — E876 Hypokalemia: Secondary | ICD-10-CM | POA: Diagnosis not present

## 2013-08-11 DIAGNOSIS — R0609 Other forms of dyspnea: Secondary | ICD-10-CM | POA: Diagnosis not present

## 2013-08-11 DIAGNOSIS — I10 Essential (primary) hypertension: Secondary | ICD-10-CM | POA: Diagnosis not present

## 2013-08-11 DIAGNOSIS — R5381 Other malaise: Secondary | ICD-10-CM | POA: Diagnosis not present

## 2013-08-11 DIAGNOSIS — J438 Other emphysema: Secondary | ICD-10-CM | POA: Diagnosis not present

## 2013-08-11 DIAGNOSIS — R059 Cough, unspecified: Secondary | ICD-10-CM | POA: Diagnosis not present

## 2013-08-11 DIAGNOSIS — R0989 Other specified symptoms and signs involving the circulatory and respiratory systems: Secondary | ICD-10-CM | POA: Diagnosis not present

## 2013-08-11 DIAGNOSIS — D509 Iron deficiency anemia, unspecified: Secondary | ICD-10-CM | POA: Diagnosis not present

## 2013-08-11 DIAGNOSIS — R5383 Other fatigue: Secondary | ICD-10-CM | POA: Diagnosis not present

## 2013-08-11 DIAGNOSIS — F17200 Nicotine dependence, unspecified, uncomplicated: Secondary | ICD-10-CM | POA: Diagnosis not present

## 2013-08-11 LAB — DIFFERENTIAL
Basophil #: 0.1 10*3/uL (ref 0.0–0.1)
Basophil %: 0.9 %
EOS ABS: 0.3 10*3/uL (ref 0.0–0.7)
EOS PCT: 4.3 %
LYMPHS ABS: 1.7 10*3/uL (ref 1.0–3.6)
Lymphocyte %: 20.4 %
MONOS PCT: 12.2 %
Monocyte #: 1 x10 3/mm — ABNORMAL HIGH (ref 0.2–0.9)
NEUTROS PCT: 62.2 %
Neutrophil #: 5.1 10*3/uL (ref 1.4–6.5)

## 2013-08-11 LAB — HEPATIC FUNCTION PANEL A (ARMC)
ALBUMIN: 2.6 g/dL — AB (ref 3.4–5.0)
ALK PHOS: 75 U/L
Bilirubin,Total: 0.4 mg/dL (ref 0.2–1.0)
SGOT(AST): 26 U/L (ref 15–37)
SGPT (ALT): 10 U/L — ABNORMAL LOW (ref 12–78)
TOTAL PROTEIN: 6.6 g/dL (ref 6.4–8.2)

## 2013-08-11 LAB — BASIC METABOLIC PANEL
Anion Gap: 8 (ref 7–16)
BUN: 50 mg/dL — AB (ref 7–18)
CO2: 29 mmol/L (ref 21–32)
CREATININE: 5.63 mg/dL — AB (ref 0.60–1.30)
Calcium, Total: 8.5 mg/dL (ref 8.5–10.1)
Chloride: 93 mmol/L — ABNORMAL LOW (ref 98–107)
EGFR (African American): 8 — ABNORMAL LOW
GFR CALC NON AF AMER: 7 — AB
Glucose: 92 mg/dL (ref 65–99)
Osmolality: 274 (ref 275–301)
POTASSIUM: 3.3 mmol/L — AB (ref 3.5–5.1)
SODIUM: 130 mmol/L — AB (ref 136–145)

## 2013-08-11 LAB — CBC
HCT: 27.9 % — ABNORMAL LOW (ref 35.0–47.0)
HGB: 9.4 g/dL — ABNORMAL LOW (ref 12.0–16.0)
MCH: 32 pg (ref 26.0–34.0)
MCHC: 33.8 g/dL (ref 32.0–36.0)
MCV: 95 fL (ref 80–100)
Platelet: 451 10*3/uL — ABNORMAL HIGH (ref 150–440)
RBC: 2.94 10*6/uL — AB (ref 3.80–5.20)
RDW: 12.7 % (ref 11.5–14.5)
WBC: 8.2 10*3/uL (ref 3.6–11.0)

## 2013-08-11 LAB — TROPONIN I: Troponin-I: <0.02 ng/mL

## 2013-08-12 DIAGNOSIS — D509 Iron deficiency anemia, unspecified: Secondary | ICD-10-CM | POA: Diagnosis not present

## 2013-08-12 DIAGNOSIS — N186 End stage renal disease: Secondary | ICD-10-CM | POA: Diagnosis not present

## 2013-08-13 DIAGNOSIS — N186 End stage renal disease: Secondary | ICD-10-CM | POA: Diagnosis not present

## 2013-08-13 DIAGNOSIS — D509 Iron deficiency anemia, unspecified: Secondary | ICD-10-CM | POA: Diagnosis not present

## 2013-08-14 ENCOUNTER — Ambulatory Visit: Payer: Self-pay | Admitting: Nephrology

## 2013-08-14 DIAGNOSIS — N186 End stage renal disease: Secondary | ICD-10-CM | POA: Diagnosis not present

## 2013-08-14 DIAGNOSIS — I517 Cardiomegaly: Secondary | ICD-10-CM | POA: Diagnosis not present

## 2013-08-14 DIAGNOSIS — D509 Iron deficiency anemia, unspecified: Secondary | ICD-10-CM | POA: Diagnosis not present

## 2013-08-14 DIAGNOSIS — J449 Chronic obstructive pulmonary disease, unspecified: Secondary | ICD-10-CM | POA: Diagnosis not present

## 2013-08-15 DIAGNOSIS — N186 End stage renal disease: Secondary | ICD-10-CM | POA: Diagnosis not present

## 2013-08-15 DIAGNOSIS — D509 Iron deficiency anemia, unspecified: Secondary | ICD-10-CM | POA: Diagnosis not present

## 2013-08-16 DIAGNOSIS — N186 End stage renal disease: Secondary | ICD-10-CM | POA: Diagnosis not present

## 2013-08-16 DIAGNOSIS — D509 Iron deficiency anemia, unspecified: Secondary | ICD-10-CM | POA: Diagnosis not present

## 2013-08-17 DIAGNOSIS — D509 Iron deficiency anemia, unspecified: Secondary | ICD-10-CM | POA: Diagnosis not present

## 2013-08-17 DIAGNOSIS — N186 End stage renal disease: Secondary | ICD-10-CM | POA: Diagnosis not present

## 2013-08-18 DIAGNOSIS — D509 Iron deficiency anemia, unspecified: Secondary | ICD-10-CM | POA: Diagnosis not present

## 2013-08-18 DIAGNOSIS — N186 End stage renal disease: Secondary | ICD-10-CM | POA: Diagnosis not present

## 2013-08-19 DIAGNOSIS — D509 Iron deficiency anemia, unspecified: Secondary | ICD-10-CM | POA: Diagnosis not present

## 2013-08-19 DIAGNOSIS — N186 End stage renal disease: Secondary | ICD-10-CM | POA: Diagnosis not present

## 2013-08-20 DIAGNOSIS — D509 Iron deficiency anemia, unspecified: Secondary | ICD-10-CM | POA: Diagnosis not present

## 2013-08-20 DIAGNOSIS — N186 End stage renal disease: Secondary | ICD-10-CM | POA: Diagnosis not present

## 2013-08-21 DIAGNOSIS — N186 End stage renal disease: Secondary | ICD-10-CM | POA: Diagnosis not present

## 2013-08-21 DIAGNOSIS — D509 Iron deficiency anemia, unspecified: Secondary | ICD-10-CM | POA: Diagnosis not present

## 2013-08-22 DIAGNOSIS — N186 End stage renal disease: Secondary | ICD-10-CM | POA: Diagnosis not present

## 2013-08-22 DIAGNOSIS — D509 Iron deficiency anemia, unspecified: Secondary | ICD-10-CM | POA: Diagnosis not present

## 2013-08-23 DIAGNOSIS — N186 End stage renal disease: Secondary | ICD-10-CM | POA: Diagnosis not present

## 2013-08-23 DIAGNOSIS — D509 Iron deficiency anemia, unspecified: Secondary | ICD-10-CM | POA: Diagnosis not present

## 2013-08-24 DIAGNOSIS — N186 End stage renal disease: Secondary | ICD-10-CM | POA: Diagnosis not present

## 2013-08-24 DIAGNOSIS — D509 Iron deficiency anemia, unspecified: Secondary | ICD-10-CM | POA: Diagnosis not present

## 2013-08-25 DIAGNOSIS — N186 End stage renal disease: Secondary | ICD-10-CM | POA: Diagnosis not present

## 2013-08-25 DIAGNOSIS — D509 Iron deficiency anemia, unspecified: Secondary | ICD-10-CM | POA: Diagnosis not present

## 2013-08-26 DIAGNOSIS — D509 Iron deficiency anemia, unspecified: Secondary | ICD-10-CM | POA: Diagnosis not present

## 2013-08-26 DIAGNOSIS — N186 End stage renal disease: Secondary | ICD-10-CM | POA: Diagnosis not present

## 2013-08-27 DIAGNOSIS — D509 Iron deficiency anemia, unspecified: Secondary | ICD-10-CM | POA: Diagnosis not present

## 2013-08-27 DIAGNOSIS — N186 End stage renal disease: Secondary | ICD-10-CM | POA: Diagnosis not present

## 2013-08-28 DIAGNOSIS — D509 Iron deficiency anemia, unspecified: Secondary | ICD-10-CM | POA: Diagnosis not present

## 2013-08-28 DIAGNOSIS — N186 End stage renal disease: Secondary | ICD-10-CM | POA: Diagnosis not present

## 2013-08-29 DIAGNOSIS — N186 End stage renal disease: Secondary | ICD-10-CM | POA: Diagnosis not present

## 2013-08-29 DIAGNOSIS — D509 Iron deficiency anemia, unspecified: Secondary | ICD-10-CM | POA: Diagnosis not present

## 2013-08-30 DIAGNOSIS — N186 End stage renal disease: Secondary | ICD-10-CM | POA: Diagnosis not present

## 2013-08-30 DIAGNOSIS — D509 Iron deficiency anemia, unspecified: Secondary | ICD-10-CM | POA: Diagnosis not present

## 2013-08-31 DIAGNOSIS — D509 Iron deficiency anemia, unspecified: Secondary | ICD-10-CM | POA: Diagnosis not present

## 2013-08-31 DIAGNOSIS — N186 End stage renal disease: Secondary | ICD-10-CM | POA: Diagnosis not present

## 2013-09-01 DIAGNOSIS — D509 Iron deficiency anemia, unspecified: Secondary | ICD-10-CM | POA: Diagnosis not present

## 2013-09-01 DIAGNOSIS — N186 End stage renal disease: Secondary | ICD-10-CM | POA: Diagnosis not present

## 2013-09-02 DIAGNOSIS — N186 End stage renal disease: Secondary | ICD-10-CM | POA: Diagnosis not present

## 2013-09-02 DIAGNOSIS — D509 Iron deficiency anemia, unspecified: Secondary | ICD-10-CM | POA: Diagnosis not present

## 2013-09-03 DIAGNOSIS — N186 End stage renal disease: Secondary | ICD-10-CM | POA: Diagnosis not present

## 2013-09-03 DIAGNOSIS — E559 Vitamin D deficiency, unspecified: Secondary | ICD-10-CM | POA: Diagnosis not present

## 2013-09-03 DIAGNOSIS — D509 Iron deficiency anemia, unspecified: Secondary | ICD-10-CM | POA: Diagnosis not present

## 2013-09-03 DIAGNOSIS — N2581 Secondary hyperparathyroidism of renal origin: Secondary | ICD-10-CM | POA: Diagnosis not present

## 2013-10-04 DIAGNOSIS — D509 Iron deficiency anemia, unspecified: Secondary | ICD-10-CM | POA: Diagnosis not present

## 2013-10-04 DIAGNOSIS — N186 End stage renal disease: Secondary | ICD-10-CM | POA: Diagnosis not present

## 2013-10-20 DIAGNOSIS — J449 Chronic obstructive pulmonary disease, unspecified: Secondary | ICD-10-CM | POA: Diagnosis not present

## 2013-10-20 DIAGNOSIS — R079 Chest pain, unspecified: Secondary | ICD-10-CM | POA: Diagnosis not present

## 2013-11-04 DIAGNOSIS — Z23 Encounter for immunization: Secondary | ICD-10-CM | POA: Diagnosis not present

## 2013-11-04 DIAGNOSIS — N186 End stage renal disease: Secondary | ICD-10-CM | POA: Diagnosis not present

## 2013-11-04 DIAGNOSIS — D509 Iron deficiency anemia, unspecified: Secondary | ICD-10-CM | POA: Diagnosis not present

## 2013-12-04 DIAGNOSIS — E559 Vitamin D deficiency, unspecified: Secondary | ICD-10-CM | POA: Diagnosis not present

## 2013-12-04 DIAGNOSIS — N186 End stage renal disease: Secondary | ICD-10-CM | POA: Diagnosis not present

## 2013-12-04 DIAGNOSIS — Z992 Dependence on renal dialysis: Secondary | ICD-10-CM | POA: Diagnosis not present

## 2013-12-04 DIAGNOSIS — J45998 Other asthma: Secondary | ICD-10-CM | POA: Diagnosis not present

## 2013-12-04 DIAGNOSIS — J449 Chronic obstructive pulmonary disease, unspecified: Secondary | ICD-10-CM | POA: Diagnosis not present

## 2013-12-04 DIAGNOSIS — N2581 Secondary hyperparathyroidism of renal origin: Secondary | ICD-10-CM | POA: Diagnosis not present

## 2013-12-04 DIAGNOSIS — D509 Iron deficiency anemia, unspecified: Secondary | ICD-10-CM | POA: Diagnosis not present

## 2013-12-04 DIAGNOSIS — I10 Essential (primary) hypertension: Secondary | ICD-10-CM | POA: Diagnosis not present

## 2013-12-04 DIAGNOSIS — F419 Anxiety disorder, unspecified: Secondary | ICD-10-CM | POA: Diagnosis not present

## 2013-12-24 ENCOUNTER — Encounter: Payer: Self-pay | Admitting: Nephrology

## 2013-12-24 DIAGNOSIS — Z9181 History of falling: Secondary | ICD-10-CM | POA: Diagnosis not present

## 2013-12-24 DIAGNOSIS — R269 Unspecified abnormalities of gait and mobility: Secondary | ICD-10-CM | POA: Diagnosis not present

## 2013-12-24 DIAGNOSIS — Z5189 Encounter for other specified aftercare: Secondary | ICD-10-CM | POA: Diagnosis not present

## 2013-12-30 DIAGNOSIS — Z5189 Encounter for other specified aftercare: Secondary | ICD-10-CM | POA: Diagnosis not present

## 2013-12-30 DIAGNOSIS — R42 Dizziness and giddiness: Secondary | ICD-10-CM | POA: Diagnosis not present

## 2013-12-30 DIAGNOSIS — Z9181 History of falling: Secondary | ICD-10-CM | POA: Diagnosis not present

## 2013-12-30 DIAGNOSIS — E041 Nontoxic single thyroid nodule: Secondary | ICD-10-CM | POA: Diagnosis not present

## 2013-12-30 DIAGNOSIS — R269 Unspecified abnormalities of gait and mobility: Secondary | ICD-10-CM | POA: Diagnosis not present

## 2014-01-01 DIAGNOSIS — H52223 Regular astigmatism, bilateral: Secondary | ICD-10-CM | POA: Diagnosis not present

## 2014-01-01 DIAGNOSIS — H5203 Hypermetropia, bilateral: Secondary | ICD-10-CM | POA: Diagnosis not present

## 2014-01-01 DIAGNOSIS — H524 Presbyopia: Secondary | ICD-10-CM | POA: Diagnosis not present

## 2014-01-01 DIAGNOSIS — H2513 Age-related nuclear cataract, bilateral: Secondary | ICD-10-CM | POA: Diagnosis not present

## 2014-01-01 DIAGNOSIS — I10 Essential (primary) hypertension: Secondary | ICD-10-CM | POA: Diagnosis not present

## 2014-01-04 ENCOUNTER — Encounter: Payer: Self-pay | Admitting: Nephrology

## 2014-01-04 DIAGNOSIS — Z992 Dependence on renal dialysis: Secondary | ICD-10-CM | POA: Diagnosis not present

## 2014-01-04 DIAGNOSIS — Z5189 Encounter for other specified aftercare: Secondary | ICD-10-CM | POA: Diagnosis not present

## 2014-01-04 DIAGNOSIS — Z9181 History of falling: Secondary | ICD-10-CM | POA: Diagnosis not present

## 2014-01-04 DIAGNOSIS — D509 Iron deficiency anemia, unspecified: Secondary | ICD-10-CM | POA: Diagnosis not present

## 2014-01-04 DIAGNOSIS — N186 End stage renal disease: Secondary | ICD-10-CM | POA: Diagnosis not present

## 2014-01-04 DIAGNOSIS — R269 Unspecified abnormalities of gait and mobility: Secondary | ICD-10-CM | POA: Diagnosis not present

## 2014-01-05 ENCOUNTER — Encounter: Payer: Self-pay | Admitting: General Surgery

## 2014-01-05 DIAGNOSIS — D509 Iron deficiency anemia, unspecified: Secondary | ICD-10-CM | POA: Diagnosis not present

## 2014-01-05 DIAGNOSIS — N186 End stage renal disease: Secondary | ICD-10-CM | POA: Diagnosis not present

## 2014-01-06 DIAGNOSIS — N186 End stage renal disease: Secondary | ICD-10-CM | POA: Diagnosis not present

## 2014-01-06 DIAGNOSIS — D509 Iron deficiency anemia, unspecified: Secondary | ICD-10-CM | POA: Diagnosis not present

## 2014-01-06 DIAGNOSIS — E041 Nontoxic single thyroid nodule: Secondary | ICD-10-CM | POA: Diagnosis not present

## 2014-01-07 DIAGNOSIS — N186 End stage renal disease: Secondary | ICD-10-CM | POA: Diagnosis not present

## 2014-01-07 DIAGNOSIS — D509 Iron deficiency anemia, unspecified: Secondary | ICD-10-CM | POA: Diagnosis not present

## 2014-01-08 DIAGNOSIS — N186 End stage renal disease: Secondary | ICD-10-CM | POA: Diagnosis not present

## 2014-01-08 DIAGNOSIS — D509 Iron deficiency anemia, unspecified: Secondary | ICD-10-CM | POA: Diagnosis not present

## 2014-01-09 DIAGNOSIS — N186 End stage renal disease: Secondary | ICD-10-CM | POA: Diagnosis not present

## 2014-01-09 DIAGNOSIS — D509 Iron deficiency anemia, unspecified: Secondary | ICD-10-CM | POA: Diagnosis not present

## 2014-01-10 DIAGNOSIS — N186 End stage renal disease: Secondary | ICD-10-CM | POA: Diagnosis not present

## 2014-01-10 DIAGNOSIS — D509 Iron deficiency anemia, unspecified: Secondary | ICD-10-CM | POA: Diagnosis not present

## 2014-01-11 DIAGNOSIS — D509 Iron deficiency anemia, unspecified: Secondary | ICD-10-CM | POA: Diagnosis not present

## 2014-01-11 DIAGNOSIS — N186 End stage renal disease: Secondary | ICD-10-CM | POA: Diagnosis not present

## 2014-01-12 DIAGNOSIS — D509 Iron deficiency anemia, unspecified: Secondary | ICD-10-CM | POA: Diagnosis not present

## 2014-01-12 DIAGNOSIS — N186 End stage renal disease: Secondary | ICD-10-CM | POA: Diagnosis not present

## 2014-01-13 DIAGNOSIS — N186 End stage renal disease: Secondary | ICD-10-CM | POA: Diagnosis not present

## 2014-01-13 DIAGNOSIS — D509 Iron deficiency anemia, unspecified: Secondary | ICD-10-CM | POA: Diagnosis not present

## 2014-01-14 DIAGNOSIS — D509 Iron deficiency anemia, unspecified: Secondary | ICD-10-CM | POA: Diagnosis not present

## 2014-01-14 DIAGNOSIS — N186 End stage renal disease: Secondary | ICD-10-CM | POA: Diagnosis not present

## 2014-01-15 DIAGNOSIS — N186 End stage renal disease: Secondary | ICD-10-CM | POA: Diagnosis not present

## 2014-01-15 DIAGNOSIS — D509 Iron deficiency anemia, unspecified: Secondary | ICD-10-CM | POA: Diagnosis not present

## 2014-01-16 DIAGNOSIS — D509 Iron deficiency anemia, unspecified: Secondary | ICD-10-CM | POA: Diagnosis not present

## 2014-01-16 DIAGNOSIS — N186 End stage renal disease: Secondary | ICD-10-CM | POA: Diagnosis not present

## 2014-01-17 DIAGNOSIS — N186 End stage renal disease: Secondary | ICD-10-CM | POA: Diagnosis not present

## 2014-01-17 DIAGNOSIS — D509 Iron deficiency anemia, unspecified: Secondary | ICD-10-CM | POA: Diagnosis not present

## 2014-01-18 DIAGNOSIS — N186 End stage renal disease: Secondary | ICD-10-CM | POA: Diagnosis not present

## 2014-01-18 DIAGNOSIS — D509 Iron deficiency anemia, unspecified: Secondary | ICD-10-CM | POA: Diagnosis not present

## 2014-01-19 DIAGNOSIS — N186 End stage renal disease: Secondary | ICD-10-CM | POA: Diagnosis not present

## 2014-01-19 DIAGNOSIS — D509 Iron deficiency anemia, unspecified: Secondary | ICD-10-CM | POA: Diagnosis not present

## 2014-01-20 DIAGNOSIS — N186 End stage renal disease: Secondary | ICD-10-CM | POA: Diagnosis not present

## 2014-01-20 DIAGNOSIS — D509 Iron deficiency anemia, unspecified: Secondary | ICD-10-CM | POA: Diagnosis not present

## 2014-01-21 DIAGNOSIS — N186 End stage renal disease: Secondary | ICD-10-CM | POA: Diagnosis not present

## 2014-01-21 DIAGNOSIS — D509 Iron deficiency anemia, unspecified: Secondary | ICD-10-CM | POA: Diagnosis not present

## 2014-01-22 DIAGNOSIS — N186 End stage renal disease: Secondary | ICD-10-CM | POA: Diagnosis not present

## 2014-01-22 DIAGNOSIS — D509 Iron deficiency anemia, unspecified: Secondary | ICD-10-CM | POA: Diagnosis not present

## 2014-01-23 DIAGNOSIS — N186 End stage renal disease: Secondary | ICD-10-CM | POA: Diagnosis not present

## 2014-01-23 DIAGNOSIS — D509 Iron deficiency anemia, unspecified: Secondary | ICD-10-CM | POA: Diagnosis not present

## 2014-01-24 DIAGNOSIS — D509 Iron deficiency anemia, unspecified: Secondary | ICD-10-CM | POA: Diagnosis not present

## 2014-01-24 DIAGNOSIS — N186 End stage renal disease: Secondary | ICD-10-CM | POA: Diagnosis not present

## 2014-01-25 DIAGNOSIS — D509 Iron deficiency anemia, unspecified: Secondary | ICD-10-CM | POA: Diagnosis not present

## 2014-01-25 DIAGNOSIS — N186 End stage renal disease: Secondary | ICD-10-CM | POA: Diagnosis not present

## 2014-01-26 DIAGNOSIS — N186 End stage renal disease: Secondary | ICD-10-CM | POA: Diagnosis not present

## 2014-01-26 DIAGNOSIS — D509 Iron deficiency anemia, unspecified: Secondary | ICD-10-CM | POA: Diagnosis not present

## 2014-01-27 DIAGNOSIS — N186 End stage renal disease: Secondary | ICD-10-CM | POA: Diagnosis not present

## 2014-01-27 DIAGNOSIS — D509 Iron deficiency anemia, unspecified: Secondary | ICD-10-CM | POA: Diagnosis not present

## 2014-01-28 DIAGNOSIS — D509 Iron deficiency anemia, unspecified: Secondary | ICD-10-CM | POA: Diagnosis not present

## 2014-01-28 DIAGNOSIS — N186 End stage renal disease: Secondary | ICD-10-CM | POA: Diagnosis not present

## 2014-01-29 DIAGNOSIS — N186 End stage renal disease: Secondary | ICD-10-CM | POA: Diagnosis not present

## 2014-01-29 DIAGNOSIS — D509 Iron deficiency anemia, unspecified: Secondary | ICD-10-CM | POA: Diagnosis not present

## 2014-01-30 DIAGNOSIS — D509 Iron deficiency anemia, unspecified: Secondary | ICD-10-CM | POA: Diagnosis not present

## 2014-01-30 DIAGNOSIS — N186 End stage renal disease: Secondary | ICD-10-CM | POA: Diagnosis not present

## 2014-01-31 DIAGNOSIS — N186 End stage renal disease: Secondary | ICD-10-CM | POA: Diagnosis not present

## 2014-01-31 DIAGNOSIS — D509 Iron deficiency anemia, unspecified: Secondary | ICD-10-CM | POA: Diagnosis not present

## 2014-02-01 DIAGNOSIS — D509 Iron deficiency anemia, unspecified: Secondary | ICD-10-CM | POA: Diagnosis not present

## 2014-02-01 DIAGNOSIS — N186 End stage renal disease: Secondary | ICD-10-CM | POA: Diagnosis not present

## 2014-02-02 DIAGNOSIS — D509 Iron deficiency anemia, unspecified: Secondary | ICD-10-CM | POA: Diagnosis not present

## 2014-02-02 DIAGNOSIS — N186 End stage renal disease: Secondary | ICD-10-CM | POA: Diagnosis not present

## 2014-02-03 ENCOUNTER — Encounter: Payer: Self-pay | Admitting: Nephrology

## 2014-02-03 DIAGNOSIS — Z9181 History of falling: Secondary | ICD-10-CM | POA: Diagnosis not present

## 2014-02-03 DIAGNOSIS — N186 End stage renal disease: Secondary | ICD-10-CM | POA: Diagnosis not present

## 2014-02-03 DIAGNOSIS — D509 Iron deficiency anemia, unspecified: Secondary | ICD-10-CM | POA: Diagnosis not present

## 2014-02-03 DIAGNOSIS — R269 Unspecified abnormalities of gait and mobility: Secondary | ICD-10-CM | POA: Diagnosis not present

## 2014-02-04 DIAGNOSIS — D509 Iron deficiency anemia, unspecified: Secondary | ICD-10-CM | POA: Diagnosis not present

## 2014-02-04 DIAGNOSIS — N186 End stage renal disease: Secondary | ICD-10-CM | POA: Diagnosis not present

## 2014-02-05 DIAGNOSIS — N186 End stage renal disease: Secondary | ICD-10-CM | POA: Diagnosis not present

## 2014-02-05 DIAGNOSIS — D509 Iron deficiency anemia, unspecified: Secondary | ICD-10-CM | POA: Diagnosis not present

## 2014-02-06 DIAGNOSIS — D509 Iron deficiency anemia, unspecified: Secondary | ICD-10-CM | POA: Diagnosis not present

## 2014-02-06 DIAGNOSIS — N186 End stage renal disease: Secondary | ICD-10-CM | POA: Diagnosis not present

## 2014-02-07 DIAGNOSIS — N186 End stage renal disease: Secondary | ICD-10-CM | POA: Diagnosis not present

## 2014-02-07 DIAGNOSIS — D509 Iron deficiency anemia, unspecified: Secondary | ICD-10-CM | POA: Diagnosis not present

## 2014-02-08 DIAGNOSIS — D509 Iron deficiency anemia, unspecified: Secondary | ICD-10-CM | POA: Diagnosis not present

## 2014-02-08 DIAGNOSIS — N186 End stage renal disease: Secondary | ICD-10-CM | POA: Diagnosis not present

## 2014-02-09 DIAGNOSIS — N186 End stage renal disease: Secondary | ICD-10-CM | POA: Diagnosis not present

## 2014-02-09 DIAGNOSIS — D509 Iron deficiency anemia, unspecified: Secondary | ICD-10-CM | POA: Diagnosis not present

## 2014-02-10 DIAGNOSIS — N186 End stage renal disease: Secondary | ICD-10-CM | POA: Diagnosis not present

## 2014-02-10 DIAGNOSIS — D509 Iron deficiency anemia, unspecified: Secondary | ICD-10-CM | POA: Diagnosis not present

## 2014-02-11 DIAGNOSIS — D509 Iron deficiency anemia, unspecified: Secondary | ICD-10-CM | POA: Diagnosis not present

## 2014-02-11 DIAGNOSIS — N186 End stage renal disease: Secondary | ICD-10-CM | POA: Diagnosis not present

## 2014-02-12 DIAGNOSIS — N186 End stage renal disease: Secondary | ICD-10-CM | POA: Diagnosis not present

## 2014-02-12 DIAGNOSIS — D509 Iron deficiency anemia, unspecified: Secondary | ICD-10-CM | POA: Diagnosis not present

## 2014-02-13 DIAGNOSIS — N186 End stage renal disease: Secondary | ICD-10-CM | POA: Diagnosis not present

## 2014-02-13 DIAGNOSIS — D509 Iron deficiency anemia, unspecified: Secondary | ICD-10-CM | POA: Diagnosis not present

## 2014-02-14 DIAGNOSIS — N186 End stage renal disease: Secondary | ICD-10-CM | POA: Diagnosis not present

## 2014-02-14 DIAGNOSIS — D509 Iron deficiency anemia, unspecified: Secondary | ICD-10-CM | POA: Diagnosis not present

## 2014-02-15 DIAGNOSIS — N186 End stage renal disease: Secondary | ICD-10-CM | POA: Diagnosis not present

## 2014-02-15 DIAGNOSIS — D509 Iron deficiency anemia, unspecified: Secondary | ICD-10-CM | POA: Diagnosis not present

## 2014-02-16 DIAGNOSIS — N186 End stage renal disease: Secondary | ICD-10-CM | POA: Diagnosis not present

## 2014-02-16 DIAGNOSIS — D509 Iron deficiency anemia, unspecified: Secondary | ICD-10-CM | POA: Diagnosis not present

## 2014-02-17 DIAGNOSIS — N186 End stage renal disease: Secondary | ICD-10-CM | POA: Diagnosis not present

## 2014-02-17 DIAGNOSIS — D509 Iron deficiency anemia, unspecified: Secondary | ICD-10-CM | POA: Diagnosis not present

## 2014-02-18 DIAGNOSIS — N186 End stage renal disease: Secondary | ICD-10-CM | POA: Diagnosis not present

## 2014-02-18 DIAGNOSIS — D509 Iron deficiency anemia, unspecified: Secondary | ICD-10-CM | POA: Diagnosis not present

## 2014-02-19 DIAGNOSIS — D509 Iron deficiency anemia, unspecified: Secondary | ICD-10-CM | POA: Diagnosis not present

## 2014-02-19 DIAGNOSIS — N186 End stage renal disease: Secondary | ICD-10-CM | POA: Diagnosis not present

## 2014-02-20 DIAGNOSIS — N186 End stage renal disease: Secondary | ICD-10-CM | POA: Diagnosis not present

## 2014-02-20 DIAGNOSIS — D509 Iron deficiency anemia, unspecified: Secondary | ICD-10-CM | POA: Diagnosis not present

## 2014-02-21 DIAGNOSIS — N186 End stage renal disease: Secondary | ICD-10-CM | POA: Diagnosis not present

## 2014-02-21 DIAGNOSIS — D509 Iron deficiency anemia, unspecified: Secondary | ICD-10-CM | POA: Diagnosis not present

## 2014-02-22 DIAGNOSIS — D509 Iron deficiency anemia, unspecified: Secondary | ICD-10-CM | POA: Diagnosis not present

## 2014-02-22 DIAGNOSIS — N186 End stage renal disease: Secondary | ICD-10-CM | POA: Diagnosis not present

## 2014-02-23 DIAGNOSIS — N186 End stage renal disease: Secondary | ICD-10-CM | POA: Diagnosis not present

## 2014-02-23 DIAGNOSIS — D509 Iron deficiency anemia, unspecified: Secondary | ICD-10-CM | POA: Diagnosis not present

## 2014-02-24 DIAGNOSIS — D509 Iron deficiency anemia, unspecified: Secondary | ICD-10-CM | POA: Diagnosis not present

## 2014-02-24 DIAGNOSIS — N186 End stage renal disease: Secondary | ICD-10-CM | POA: Diagnosis not present

## 2014-02-25 DIAGNOSIS — N186 End stage renal disease: Secondary | ICD-10-CM | POA: Diagnosis not present

## 2014-02-25 DIAGNOSIS — D509 Iron deficiency anemia, unspecified: Secondary | ICD-10-CM | POA: Diagnosis not present

## 2014-02-26 DIAGNOSIS — N186 End stage renal disease: Secondary | ICD-10-CM | POA: Diagnosis not present

## 2014-02-26 DIAGNOSIS — D509 Iron deficiency anemia, unspecified: Secondary | ICD-10-CM | POA: Diagnosis not present

## 2014-02-27 DIAGNOSIS — D509 Iron deficiency anemia, unspecified: Secondary | ICD-10-CM | POA: Diagnosis not present

## 2014-02-27 DIAGNOSIS — N186 End stage renal disease: Secondary | ICD-10-CM | POA: Diagnosis not present

## 2014-02-28 DIAGNOSIS — N186 End stage renal disease: Secondary | ICD-10-CM | POA: Diagnosis not present

## 2014-02-28 DIAGNOSIS — D509 Iron deficiency anemia, unspecified: Secondary | ICD-10-CM | POA: Diagnosis not present

## 2014-03-01 DIAGNOSIS — D509 Iron deficiency anemia, unspecified: Secondary | ICD-10-CM | POA: Diagnosis not present

## 2014-03-01 DIAGNOSIS — N186 End stage renal disease: Secondary | ICD-10-CM | POA: Diagnosis not present

## 2014-03-02 DIAGNOSIS — N186 End stage renal disease: Secondary | ICD-10-CM | POA: Diagnosis not present

## 2014-03-02 DIAGNOSIS — D509 Iron deficiency anemia, unspecified: Secondary | ICD-10-CM | POA: Diagnosis not present

## 2014-03-03 DIAGNOSIS — N186 End stage renal disease: Secondary | ICD-10-CM | POA: Diagnosis not present

## 2014-03-03 DIAGNOSIS — D509 Iron deficiency anemia, unspecified: Secondary | ICD-10-CM | POA: Diagnosis not present

## 2014-03-04 DIAGNOSIS — D509 Iron deficiency anemia, unspecified: Secondary | ICD-10-CM | POA: Diagnosis not present

## 2014-03-04 DIAGNOSIS — N186 End stage renal disease: Secondary | ICD-10-CM | POA: Diagnosis not present

## 2014-03-05 DIAGNOSIS — N186 End stage renal disease: Secondary | ICD-10-CM | POA: Diagnosis not present

## 2014-03-05 DIAGNOSIS — D509 Iron deficiency anemia, unspecified: Secondary | ICD-10-CM | POA: Diagnosis not present

## 2014-03-06 ENCOUNTER — Encounter: Payer: Self-pay | Admitting: Nephrology

## 2014-03-06 DIAGNOSIS — D509 Iron deficiency anemia, unspecified: Secondary | ICD-10-CM | POA: Diagnosis not present

## 2014-03-06 DIAGNOSIS — Z992 Dependence on renal dialysis: Secondary | ICD-10-CM | POA: Diagnosis not present

## 2014-03-06 DIAGNOSIS — N186 End stage renal disease: Secondary | ICD-10-CM | POA: Diagnosis not present

## 2014-04-01 ENCOUNTER — Ambulatory Visit (INDEPENDENT_AMBULATORY_CARE_PROVIDER_SITE_OTHER): Payer: Medicare Other | Admitting: Internal Medicine

## 2014-04-01 VITALS — BP 130/70 | HR 79

## 2014-04-01 DIAGNOSIS — R05 Cough: Secondary | ICD-10-CM

## 2014-04-01 DIAGNOSIS — Z720 Tobacco use: Secondary | ICD-10-CM

## 2014-04-01 DIAGNOSIS — J449 Chronic obstructive pulmonary disease, unspecified: Secondary | ICD-10-CM

## 2014-04-01 DIAGNOSIS — R059 Cough, unspecified: Secondary | ICD-10-CM

## 2014-04-01 DIAGNOSIS — F17200 Nicotine dependence, unspecified, uncomplicated: Secondary | ICD-10-CM | POA: Insufficient documentation

## 2014-04-01 NOTE — Assessment & Plan Note (Signed)
Tobacco Cessation - Counseling regarding benefits of smoking cessation strategies was provided for more than 12 min. - Educated that at this time smoking- cessation represents the single most important step that patient can take to enhance the length and quality of live. - Educated patient regarding alternatives of behavior interventions, pharmacotherapy including NRT and non-nicotine therapy such, and combinations of both. - Patient at this time: will try to quit on her own 

## 2014-04-01 NOTE — Patient Instructions (Addendum)
Please follow up with Dr. Stevenson Clinch in 2 weeks  Cough - most likely post infectious in combination with COPD and tobacco use - tobacco cessation is paramount to suppressing cough - pulmonary functioning testing - 2 view chest xray prior to next visit

## 2014-04-01 NOTE — Progress Notes (Signed)
Date: 04/01/2014  MRN# [id number] Lydia Clark [birth year]  Referring Physician: Dr. Juleen China PMD - Dr. Ricka Burdock Lydia Clark is a 74 y.o. old female seen in consultation for cough  CC:  Chief Complaint  Patient presents with  . Advice Only    Referred for chronic cough: She was given zpac in Dec 2015. Pt complains of cough with clear mucus. She has sob, wheezing, and  chest tightness rarely.    HPI:  74 yo female with PMHx as stated below (ESRD on PD), Left Breast Cancer, COPD (Clinical diagnosis), referred for evaluation of cough since November. Cough characteristics: - onset was Nov 2015, inciting factor - possible URI, mild productive , thought that she was having a mild touch of bronchitis, no fever, or chills.  However, in Dec 2015 continued to have cough, most clear sputum, was given a Zpak which initially helped with cough, but did not clear cough completely. - now with coughing spells (with mild sputum production, mostly clear), last a few seconds, on a daily basis. - no chills - has intermittent wheezing - no inciting factor to the cough currently.  Patient also follows with ENT (Dr. Pryor Ochoa), for chronic sinusitis. Does not require any maintenance therapy, only during hay season, or high pollen days. In September of 2015 patient had an accidental fall, hurting left wrist and left side, CXR done at that time show no fractures.  Patient was told she probably had COPD in the early 2000s, she has been on Spiriva for the about the same time, she was put on it for shortness of breath, which helped.  Currently smoking about 1ppd sometimes 8 cigs per day (at highest 1ppd x 45 years).    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 Breast cancer: No evidence of disease.  Patient has completed  5 years of letrozole.  Recommend continuing yearly diagnostic mammograms which can now be ordered by her primary care physician PMHX:   Past Medical History  Diagnosis Date  . Allergy   . Cancer 2008    left breast  . Hypertension 2006  . Personal history of tobacco use, presenting hazards to health 2012    30 yrs smoking  . Endocrine disorder 2008    thyroid  . Personal history of malignant neoplasm of breast 2008  . Breast screening, unspecified 2013  . Special screening for malignant neoplasms, colon 2013  . COPD (chronic obstructive pulmonary disease)   . PE (pulmonary embolism)     patient denies  . Swelling of throat     swelling at base of throat,  . Anxiety     anxiety  . Pneumonia     hx  . Heart murmur     child  . History of kidney stones   . GERD (gastroesophageal reflux disease)   . Arthritis   . Multinodular goiter     followed by Dr. Carloyn Manner @ [facility]  . Dialysis patient 2014  . Chronic kidney disease 2014    stage IV chronic    Surgical Hx:  Past Surgical History  Procedure Laterality Date  . Breast surgery Left 2008    lumpectomy  . Fooy Left     lft foot  . Foot surgery  2005  . 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  Tonsillectomy  2005  . Breast biopsy  2005  . Appendectomy  2005  . Replacement total knee Left 2012  . Colonoscopy  2007    done in [place]  . Insertion of dialysis catheter Right 07/02/2012    Procedure: INSERTION OF DIALYSIS CATHETER;  Surgeon: Elam Dutch, MD;  Location: [place];  Service: Vascular;  Laterality: Right;  Right Internal Jugular  . Insertion of dialysis catheter Right July 02 2012    right chest/ temporary cath   Family Hx:  Family History  Problem Relation Age of Onset  . Cancer Other     breast cancer   Social Hx:   History  Substance Use Topics  . Smoking status: Current Every Day Smoker -- 1.00 packs/day for 30 years    Types: Cigarettes  . Smokeless tobacco:  Not on file  . Alcohol Use: No   Medication:   Current Outpatient Rx  Name  Route  Sig  Dispense  Refill  . aspirin 81 MG tablet   Oral   Take 81 mg by mouth daily.         . Cholecalciferol (VITAMIN D3) 2000 UNITS TABS   Oral   Take 1 tablet by mouth daily.         . fexofenadine (ALLEGRA) 60 MG tablet   Oral   Take 60 mg by mouth daily.      3   . gentamicin ointment (GARAMYCIN) 0.1 %   Topical   Apply 1 application topically 3 (three) times daily.      10   . LORazepam (ATIVAN) 1 MG tablet   Oral   Take 1 mg by mouth at bedtime as needed for anxiety.          Marland Kitchen omeprazole (PRILOSEC) 20 MG capsule   Oral   Take 20 mg by mouth daily.         . rosuvastatin (CRESTOR) 10 MG tablet   Oral   Take 10 mg by mouth daily.         Marland Kitchen tiotropium (SPIRIVA) 18 MCG inhalation capsule   Inhalation   Place 18 mcg into inhaler and inhale daily.             Allergies:  Bee venom; Biaxin; Mycostatin; and Sulfa antibiotics  Review of Systems: Gen:  Denies  fever, sweats, chills HEENT: Denies blurred vision, double vision, ear pain, eye pain, hearing loss, nose bleeds, sore throat Cvc:  No dizziness, chest pain or heaviness Resp:   Mild wheezing, coughing spells Gi: Denies swallowing difficulty, stomach pain, nausea or vomiting, diarrhea, constipation, bowel incontinence Gu:  Denies bladder incontinence, burning urine Ext:   No Joint pain, stiffness or swelling Skin: No skin rash, easy bruising or bleeding or hives Endoc:  No polyuria, polydipsia , polyphagia or weight change Psych: No depression, insomnia or hallucinations  Other:  All other systems negative  Physical Examination:   VS: There were no vitals taken for this visit.  General Appearance: No distress  Neuro:without focal findings, mental status, speech normal, alert and oriented, cranial nerves 2-12 intact, reflexes normal and symmetric, sensation grossly normal  HEENT: PERRLA, EOM intact, no  ptosis, no other lesions noticed; Mallampati 2 Pulmonary: fine crackles at the bases, no wheezing, no coughing, good respiratory effort.  CardiovascularNormal S1,S2.  No m/r/g.  Abdominal aorta pulsation normal.    Abdomen: Benign, Soft, non-tender, No masses, hepatosplenomegaly, No lymphadenopathy Renal:  No costovertebral tenderness  GU:  No performed at  this time. Endoc: No evident thyromegaly, no signs of acromegaly or Cushing features Skin:   warm, no rashes, no ecchymosis  Extremities: normal, no cyanosis, clubbing, no edema, warm with normal capillary refill. Other findings:     Rad results: (The following images and results were reviewed by Dr. Stevenson Clinch). CXR June 2015 CHEST  2 VIEW  COMPARISON:  08/11/2013  FINDINGS: Apparent tiny right lateral mid pneumothorax has improved and has nearly resolved. Lungs remain hyperaerated. Borderline cardiomegaly. Normal vascularity. No pleural effusion. Stable thoracic spine.   IMPRESSION: Nearly resolved right pneumothorax.  F\U CXR Feb 2,2016 The lungs are hyperinflated with hemidiaphragm flattening. There is no focal infiltrate. There is no pleural effusion or pneumothorax. There is minimal scarring at the left lung base which is stable. The heart and pulmonary vascularity are normal. The mediastinum is normal in width. There is tortuosity of the descending thoracic aorta with calcification within its wall. There is gentle S-shaped thoracic scoliosis.   IMPRESSION: COPD.  There is no active cardiopulmonary disease.   Assessment and Plan: Tobacco use disorder Tobacco Cessation - Counseling regarding benefits of smoking cessation strategies was provided for more than 12 min. - Educated that at this time smoking- cessation represents the single most important step that patient can take to enhance the length and quality of live. - Educated patient regarding alternatives of behavior interventions, pharmacotherapy including NRT and  non-nicotine therapy such, and combinations of both. - Patient at this time: will try to quit on her own     COPD, mild Currently on Spiriva for a number of years. Give her limited symptoms, she is probably mild COPD (Stage A or B)  Plan - check PFTs - check CXR - tobacco cessation - cont with spiriva    Cough Most likely a post infectious in combination with COPD and tobacco use  Plan: - tobacco cessation is paramount to suppressing cough - pulmonary functioning testing - 2 view chest xray, if no change will then consider ct chest with contrast     Updated Medication List Outpatient Encounter Prescriptions as of 04/01/2014  Medication Sig  . aspirin 81 MG tablet Take 81 mg by mouth daily.  . Cholecalciferol (VITAMIN D3) 2000 UNITS TABS Take 1 tablet by mouth daily.  . fexofenadine (ALLEGRA) 60 MG tablet Take 60 mg by mouth daily.  Marland Kitchen gentamicin ointment (GARAMYCIN) 0.1 % Apply 1 application topically 3 (three) times daily.  Marland Kitchen LORazepam (ATIVAN) 1 MG tablet Take 1 mg by mouth at bedtime as needed for anxiety.   Marland Kitchen omeprazole (PRILOSEC) 20 MG capsule Take 20 mg by mouth daily.  . rosuvastatin (CRESTOR) 10 MG tablet Take 10 mg by mouth daily.  Marland Kitchen tiotropium (SPIRIVA) 18 MCG inhalation capsule Place 18 mcg into inhaler and inhale daily.  . [DISCONTINUED] amLODipine-olmesartan (AZOR) 10-40 MG per tablet Take 1 tablet by mouth daily.  . [DISCONTINUED] levofloxacin (LEVAQUIN) 500 MG tablet Take 500 mg by mouth daily.    Orders for this visit: Orders Placed This Encounter  Procedures  . DG Chest 2 View    Standing Status: Future     Number of Occurrences:      Standing Expiration Date: 05/31/2015    Scheduling Instructions:     Order given to patient.[place]    Order Specific Question:  Reason for Exam (SYMPTOM  OR DIAGNOSIS REQUIRED)    Answer:  cough    Order Specific Question:  Preferred imaging location?    Answer:  External  Comments:  [facility]     Thank  you for the  consultation and for allowing [facility] to assist in the care of your patient. Our recommendations are noted above.  Please contact us if we can be of further service.   Vilinda Boehringer, MD [facility] Office Number: [phone]

## 2014-04-06 DIAGNOSIS — D509 Iron deficiency anemia, unspecified: Secondary | ICD-10-CM | POA: Diagnosis not present

## 2014-04-06 DIAGNOSIS — F329 Major depressive disorder, single episode, unspecified: Secondary | ICD-10-CM | POA: Diagnosis not present

## 2014-04-06 DIAGNOSIS — N2581 Secondary hyperparathyroidism of renal origin: Secondary | ICD-10-CM | POA: Diagnosis not present

## 2014-04-06 DIAGNOSIS — J449 Chronic obstructive pulmonary disease, unspecified: Secondary | ICD-10-CM | POA: Diagnosis not present

## 2014-04-06 DIAGNOSIS — Z992 Dependence on renal dialysis: Secondary | ICD-10-CM | POA: Diagnosis not present

## 2014-04-06 DIAGNOSIS — I129 Hypertensive chronic kidney disease with stage 1 through stage 4 chronic kidney disease, or unspecified chronic kidney disease: Secondary | ICD-10-CM | POA: Diagnosis not present

## 2014-04-06 DIAGNOSIS — I10 Essential (primary) hypertension: Secondary | ICD-10-CM | POA: Diagnosis not present

## 2014-04-06 DIAGNOSIS — N186 End stage renal disease: Secondary | ICD-10-CM | POA: Diagnosis not present

## 2014-04-07 ENCOUNTER — Encounter: Payer: Self-pay | Admitting: Internal Medicine

## 2014-04-07 ENCOUNTER — Ambulatory Visit: Payer: Self-pay | Admitting: Internal Medicine

## 2014-04-07 DIAGNOSIS — Z992 Dependence on renal dialysis: Secondary | ICD-10-CM | POA: Diagnosis not present

## 2014-04-07 DIAGNOSIS — D509 Iron deficiency anemia, unspecified: Secondary | ICD-10-CM | POA: Diagnosis not present

## 2014-04-07 DIAGNOSIS — N2581 Secondary hyperparathyroidism of renal origin: Secondary | ICD-10-CM | POA: Diagnosis not present

## 2014-04-07 DIAGNOSIS — N186 End stage renal disease: Secondary | ICD-10-CM | POA: Diagnosis not present

## 2014-04-07 DIAGNOSIS — R05 Cough: Secondary | ICD-10-CM | POA: Diagnosis not present

## 2014-04-07 DIAGNOSIS — R0989 Other specified symptoms and signs involving the circulatory and respiratory systems: Secondary | ICD-10-CM | POA: Diagnosis not present

## 2014-04-07 DIAGNOSIS — J449 Chronic obstructive pulmonary disease, unspecified: Secondary | ICD-10-CM | POA: Diagnosis not present

## 2014-04-08 ENCOUNTER — Encounter: Payer: Self-pay | Admitting: Internal Medicine

## 2014-04-08 DIAGNOSIS — D509 Iron deficiency anemia, unspecified: Secondary | ICD-10-CM | POA: Diagnosis not present

## 2014-04-08 DIAGNOSIS — Z992 Dependence on renal dialysis: Secondary | ICD-10-CM | POA: Diagnosis not present

## 2014-04-08 DIAGNOSIS — N2581 Secondary hyperparathyroidism of renal origin: Secondary | ICD-10-CM | POA: Diagnosis not present

## 2014-04-08 DIAGNOSIS — N186 End stage renal disease: Secondary | ICD-10-CM | POA: Diagnosis not present

## 2014-04-08 NOTE — Assessment & Plan Note (Signed)
Most likely a post infectious in combination with COPD and tobacco use  Plan: - tobacco cessation is paramount to suppressing cough - pulmonary functioning testing - 2 view chest xray, if no change will then consider ct chest with contrast

## 2014-04-08 NOTE — Assessment & Plan Note (Signed)
Currently on Spiriva for a number of years. Give her limited symptoms, she is probably mild COPD (Stage A or B)  Plan - check PFTs - check CXR - tobacco cessation - cont with spiriva

## 2014-04-09 DIAGNOSIS — Z992 Dependence on renal dialysis: Secondary | ICD-10-CM | POA: Diagnosis not present

## 2014-04-09 DIAGNOSIS — N186 End stage renal disease: Secondary | ICD-10-CM | POA: Diagnosis not present

## 2014-04-09 DIAGNOSIS — D509 Iron deficiency anemia, unspecified: Secondary | ICD-10-CM | POA: Diagnosis not present

## 2014-04-09 DIAGNOSIS — N2581 Secondary hyperparathyroidism of renal origin: Secondary | ICD-10-CM | POA: Diagnosis not present

## 2014-04-10 DIAGNOSIS — D509 Iron deficiency anemia, unspecified: Secondary | ICD-10-CM | POA: Diagnosis not present

## 2014-04-10 DIAGNOSIS — N2581 Secondary hyperparathyroidism of renal origin: Secondary | ICD-10-CM | POA: Diagnosis not present

## 2014-04-10 DIAGNOSIS — Z992 Dependence on renal dialysis: Secondary | ICD-10-CM | POA: Diagnosis not present

## 2014-04-10 DIAGNOSIS — N186 End stage renal disease: Secondary | ICD-10-CM | POA: Diagnosis not present

## 2014-04-11 DIAGNOSIS — Z992 Dependence on renal dialysis: Secondary | ICD-10-CM | POA: Diagnosis not present

## 2014-04-11 DIAGNOSIS — D509 Iron deficiency anemia, unspecified: Secondary | ICD-10-CM | POA: Diagnosis not present

## 2014-04-11 DIAGNOSIS — N2581 Secondary hyperparathyroidism of renal origin: Secondary | ICD-10-CM | POA: Diagnosis not present

## 2014-04-11 DIAGNOSIS — N186 End stage renal disease: Secondary | ICD-10-CM | POA: Diagnosis not present

## 2014-04-12 DIAGNOSIS — N186 End stage renal disease: Secondary | ICD-10-CM | POA: Diagnosis not present

## 2014-04-12 DIAGNOSIS — N2581 Secondary hyperparathyroidism of renal origin: Secondary | ICD-10-CM | POA: Diagnosis not present

## 2014-04-12 DIAGNOSIS — Z992 Dependence on renal dialysis: Secondary | ICD-10-CM | POA: Diagnosis not present

## 2014-04-12 DIAGNOSIS — D509 Iron deficiency anemia, unspecified: Secondary | ICD-10-CM | POA: Diagnosis not present

## 2014-04-13 DIAGNOSIS — N2581 Secondary hyperparathyroidism of renal origin: Secondary | ICD-10-CM | POA: Diagnosis not present

## 2014-04-13 DIAGNOSIS — N186 End stage renal disease: Secondary | ICD-10-CM | POA: Diagnosis not present

## 2014-04-13 DIAGNOSIS — Z992 Dependence on renal dialysis: Secondary | ICD-10-CM | POA: Diagnosis not present

## 2014-04-13 DIAGNOSIS — D509 Iron deficiency anemia, unspecified: Secondary | ICD-10-CM | POA: Diagnosis not present

## 2014-04-14 DIAGNOSIS — Z992 Dependence on renal dialysis: Secondary | ICD-10-CM | POA: Diagnosis not present

## 2014-04-14 DIAGNOSIS — D509 Iron deficiency anemia, unspecified: Secondary | ICD-10-CM | POA: Diagnosis not present

## 2014-04-14 DIAGNOSIS — N2581 Secondary hyperparathyroidism of renal origin: Secondary | ICD-10-CM | POA: Diagnosis not present

## 2014-04-14 DIAGNOSIS — N186 End stage renal disease: Secondary | ICD-10-CM | POA: Diagnosis not present

## 2014-04-15 ENCOUNTER — Other Ambulatory Visit: Payer: Self-pay | Admitting: Internal Medicine

## 2014-04-15 ENCOUNTER — Ambulatory Visit (INDEPENDENT_AMBULATORY_CARE_PROVIDER_SITE_OTHER): Payer: Medicare Other | Admitting: Internal Medicine

## 2014-04-15 DIAGNOSIS — Z992 Dependence on renal dialysis: Secondary | ICD-10-CM | POA: Diagnosis not present

## 2014-04-15 DIAGNOSIS — J449 Chronic obstructive pulmonary disease, unspecified: Secondary | ICD-10-CM | POA: Diagnosis not present

## 2014-04-15 DIAGNOSIS — D509 Iron deficiency anemia, unspecified: Secondary | ICD-10-CM | POA: Diagnosis not present

## 2014-04-15 DIAGNOSIS — N186 End stage renal disease: Secondary | ICD-10-CM | POA: Diagnosis not present

## 2014-04-15 DIAGNOSIS — N2581 Secondary hyperparathyroidism of renal origin: Secondary | ICD-10-CM | POA: Diagnosis not present

## 2014-04-16 DIAGNOSIS — N2581 Secondary hyperparathyroidism of renal origin: Secondary | ICD-10-CM | POA: Diagnosis not present

## 2014-04-16 DIAGNOSIS — Z992 Dependence on renal dialysis: Secondary | ICD-10-CM | POA: Diagnosis not present

## 2014-04-16 DIAGNOSIS — D509 Iron deficiency anemia, unspecified: Secondary | ICD-10-CM | POA: Diagnosis not present

## 2014-04-16 DIAGNOSIS — N186 End stage renal disease: Secondary | ICD-10-CM | POA: Diagnosis not present

## 2014-04-17 DIAGNOSIS — N2581 Secondary hyperparathyroidism of renal origin: Secondary | ICD-10-CM | POA: Diagnosis not present

## 2014-04-17 DIAGNOSIS — Z992 Dependence on renal dialysis: Secondary | ICD-10-CM | POA: Diagnosis not present

## 2014-04-17 DIAGNOSIS — D509 Iron deficiency anemia, unspecified: Secondary | ICD-10-CM | POA: Diagnosis not present

## 2014-04-17 DIAGNOSIS — N186 End stage renal disease: Secondary | ICD-10-CM | POA: Diagnosis not present

## 2014-04-18 DIAGNOSIS — D509 Iron deficiency anemia, unspecified: Secondary | ICD-10-CM | POA: Diagnosis not present

## 2014-04-18 DIAGNOSIS — Z992 Dependence on renal dialysis: Secondary | ICD-10-CM | POA: Diagnosis not present

## 2014-04-18 DIAGNOSIS — N186 End stage renal disease: Secondary | ICD-10-CM | POA: Diagnosis not present

## 2014-04-18 DIAGNOSIS — N2581 Secondary hyperparathyroidism of renal origin: Secondary | ICD-10-CM | POA: Diagnosis not present

## 2014-04-19 DIAGNOSIS — Z992 Dependence on renal dialysis: Secondary | ICD-10-CM | POA: Diagnosis not present

## 2014-04-19 DIAGNOSIS — N2581 Secondary hyperparathyroidism of renal origin: Secondary | ICD-10-CM | POA: Diagnosis not present

## 2014-04-19 DIAGNOSIS — D509 Iron deficiency anemia, unspecified: Secondary | ICD-10-CM | POA: Diagnosis not present

## 2014-04-19 DIAGNOSIS — N186 End stage renal disease: Secondary | ICD-10-CM | POA: Diagnosis not present

## 2014-04-20 ENCOUNTER — Ambulatory Visit: Payer: Medicare Other | Admitting: Internal Medicine

## 2014-04-20 DIAGNOSIS — N2581 Secondary hyperparathyroidism of renal origin: Secondary | ICD-10-CM | POA: Diagnosis not present

## 2014-04-20 DIAGNOSIS — Z992 Dependence on renal dialysis: Secondary | ICD-10-CM | POA: Diagnosis not present

## 2014-04-20 DIAGNOSIS — D509 Iron deficiency anemia, unspecified: Secondary | ICD-10-CM | POA: Diagnosis not present

## 2014-04-20 DIAGNOSIS — N186 End stage renal disease: Secondary | ICD-10-CM | POA: Diagnosis not present

## 2014-04-21 DIAGNOSIS — N2581 Secondary hyperparathyroidism of renal origin: Secondary | ICD-10-CM | POA: Diagnosis not present

## 2014-04-21 DIAGNOSIS — Z992 Dependence on renal dialysis: Secondary | ICD-10-CM | POA: Diagnosis not present

## 2014-04-21 DIAGNOSIS — N186 End stage renal disease: Secondary | ICD-10-CM | POA: Diagnosis not present

## 2014-04-21 DIAGNOSIS — D509 Iron deficiency anemia, unspecified: Secondary | ICD-10-CM | POA: Diagnosis not present

## 2014-04-22 DIAGNOSIS — N186 End stage renal disease: Secondary | ICD-10-CM | POA: Diagnosis not present

## 2014-04-22 DIAGNOSIS — D509 Iron deficiency anemia, unspecified: Secondary | ICD-10-CM | POA: Diagnosis not present

## 2014-04-22 DIAGNOSIS — N2581 Secondary hyperparathyroidism of renal origin: Secondary | ICD-10-CM | POA: Diagnosis not present

## 2014-04-22 DIAGNOSIS — Z992 Dependence on renal dialysis: Secondary | ICD-10-CM | POA: Diagnosis not present

## 2014-04-23 DIAGNOSIS — N2581 Secondary hyperparathyroidism of renal origin: Secondary | ICD-10-CM | POA: Diagnosis not present

## 2014-04-23 DIAGNOSIS — Z992 Dependence on renal dialysis: Secondary | ICD-10-CM | POA: Diagnosis not present

## 2014-04-23 DIAGNOSIS — D509 Iron deficiency anemia, unspecified: Secondary | ICD-10-CM | POA: Diagnosis not present

## 2014-04-23 DIAGNOSIS — N186 End stage renal disease: Secondary | ICD-10-CM | POA: Diagnosis not present

## 2014-04-24 DIAGNOSIS — N186 End stage renal disease: Secondary | ICD-10-CM | POA: Diagnosis not present

## 2014-04-24 DIAGNOSIS — D509 Iron deficiency anemia, unspecified: Secondary | ICD-10-CM | POA: Diagnosis not present

## 2014-04-24 DIAGNOSIS — Z992 Dependence on renal dialysis: Secondary | ICD-10-CM | POA: Diagnosis not present

## 2014-04-24 DIAGNOSIS — N2581 Secondary hyperparathyroidism of renal origin: Secondary | ICD-10-CM | POA: Diagnosis not present

## 2014-04-25 DIAGNOSIS — N2581 Secondary hyperparathyroidism of renal origin: Secondary | ICD-10-CM | POA: Diagnosis not present

## 2014-04-25 DIAGNOSIS — D509 Iron deficiency anemia, unspecified: Secondary | ICD-10-CM | POA: Diagnosis not present

## 2014-04-25 DIAGNOSIS — N186 End stage renal disease: Secondary | ICD-10-CM | POA: Diagnosis not present

## 2014-04-25 DIAGNOSIS — Z992 Dependence on renal dialysis: Secondary | ICD-10-CM | POA: Diagnosis not present

## 2014-04-26 DIAGNOSIS — N2581 Secondary hyperparathyroidism of renal origin: Secondary | ICD-10-CM | POA: Diagnosis not present

## 2014-04-26 DIAGNOSIS — D509 Iron deficiency anemia, unspecified: Secondary | ICD-10-CM | POA: Diagnosis not present

## 2014-04-26 DIAGNOSIS — Z992 Dependence on renal dialysis: Secondary | ICD-10-CM | POA: Diagnosis not present

## 2014-04-26 DIAGNOSIS — N186 End stage renal disease: Secondary | ICD-10-CM | POA: Diagnosis not present

## 2014-04-27 DIAGNOSIS — D509 Iron deficiency anemia, unspecified: Secondary | ICD-10-CM | POA: Diagnosis not present

## 2014-04-27 DIAGNOSIS — Z992 Dependence on renal dialysis: Secondary | ICD-10-CM | POA: Diagnosis not present

## 2014-04-27 DIAGNOSIS — N186 End stage renal disease: Secondary | ICD-10-CM | POA: Diagnosis not present

## 2014-04-27 DIAGNOSIS — N2581 Secondary hyperparathyroidism of renal origin: Secondary | ICD-10-CM | POA: Diagnosis not present

## 2014-04-28 DIAGNOSIS — N186 End stage renal disease: Secondary | ICD-10-CM | POA: Diagnosis not present

## 2014-04-28 DIAGNOSIS — Z992 Dependence on renal dialysis: Secondary | ICD-10-CM | POA: Diagnosis not present

## 2014-04-28 DIAGNOSIS — D509 Iron deficiency anemia, unspecified: Secondary | ICD-10-CM | POA: Diagnosis not present

## 2014-04-28 DIAGNOSIS — N2581 Secondary hyperparathyroidism of renal origin: Secondary | ICD-10-CM | POA: Diagnosis not present

## 2014-04-28 NOTE — Progress Notes (Signed)
PFT not performed due to machine issue.

## 2014-04-29 ENCOUNTER — Encounter: Payer: Self-pay | Admitting: Internal Medicine

## 2014-04-29 DIAGNOSIS — D509 Iron deficiency anemia, unspecified: Secondary | ICD-10-CM | POA: Diagnosis not present

## 2014-04-29 DIAGNOSIS — N186 End stage renal disease: Secondary | ICD-10-CM | POA: Diagnosis not present

## 2014-04-29 DIAGNOSIS — N2581 Secondary hyperparathyroidism of renal origin: Secondary | ICD-10-CM | POA: Diagnosis not present

## 2014-04-29 DIAGNOSIS — Z992 Dependence on renal dialysis: Secondary | ICD-10-CM | POA: Diagnosis not present

## 2014-04-30 DIAGNOSIS — N2581 Secondary hyperparathyroidism of renal origin: Secondary | ICD-10-CM | POA: Diagnosis not present

## 2014-04-30 DIAGNOSIS — Z992 Dependence on renal dialysis: Secondary | ICD-10-CM | POA: Diagnosis not present

## 2014-04-30 DIAGNOSIS — N186 End stage renal disease: Secondary | ICD-10-CM | POA: Diagnosis not present

## 2014-04-30 DIAGNOSIS — D509 Iron deficiency anemia, unspecified: Secondary | ICD-10-CM | POA: Diagnosis not present

## 2014-05-01 DIAGNOSIS — Z992 Dependence on renal dialysis: Secondary | ICD-10-CM | POA: Diagnosis not present

## 2014-05-01 DIAGNOSIS — D509 Iron deficiency anemia, unspecified: Secondary | ICD-10-CM | POA: Diagnosis not present

## 2014-05-01 DIAGNOSIS — N186 End stage renal disease: Secondary | ICD-10-CM | POA: Diagnosis not present

## 2014-05-01 DIAGNOSIS — N2581 Secondary hyperparathyroidism of renal origin: Secondary | ICD-10-CM | POA: Diagnosis not present

## 2014-05-02 DIAGNOSIS — D509 Iron deficiency anemia, unspecified: Secondary | ICD-10-CM | POA: Diagnosis not present

## 2014-05-02 DIAGNOSIS — N186 End stage renal disease: Secondary | ICD-10-CM | POA: Diagnosis not present

## 2014-05-02 DIAGNOSIS — Z992 Dependence on renal dialysis: Secondary | ICD-10-CM | POA: Diagnosis not present

## 2014-05-02 DIAGNOSIS — N2581 Secondary hyperparathyroidism of renal origin: Secondary | ICD-10-CM | POA: Diagnosis not present

## 2014-05-03 DIAGNOSIS — N2581 Secondary hyperparathyroidism of renal origin: Secondary | ICD-10-CM | POA: Diagnosis not present

## 2014-05-03 DIAGNOSIS — D509 Iron deficiency anemia, unspecified: Secondary | ICD-10-CM | POA: Diagnosis not present

## 2014-05-03 DIAGNOSIS — N186 End stage renal disease: Secondary | ICD-10-CM | POA: Diagnosis not present

## 2014-05-03 DIAGNOSIS — Z992 Dependence on renal dialysis: Secondary | ICD-10-CM | POA: Diagnosis not present

## 2014-05-04 DIAGNOSIS — D509 Iron deficiency anemia, unspecified: Secondary | ICD-10-CM | POA: Diagnosis not present

## 2014-05-04 DIAGNOSIS — Z992 Dependence on renal dialysis: Secondary | ICD-10-CM | POA: Diagnosis not present

## 2014-05-04 DIAGNOSIS — N2581 Secondary hyperparathyroidism of renal origin: Secondary | ICD-10-CM | POA: Diagnosis not present

## 2014-05-04 DIAGNOSIS — N186 End stage renal disease: Secondary | ICD-10-CM | POA: Diagnosis not present

## 2014-05-05 DIAGNOSIS — D509 Iron deficiency anemia, unspecified: Secondary | ICD-10-CM | POA: Diagnosis not present

## 2014-05-05 DIAGNOSIS — Z992 Dependence on renal dialysis: Secondary | ICD-10-CM | POA: Diagnosis not present

## 2014-05-05 DIAGNOSIS — N186 End stage renal disease: Secondary | ICD-10-CM | POA: Diagnosis not present

## 2014-05-11 ENCOUNTER — Encounter: Payer: Self-pay | Admitting: Internal Medicine

## 2014-05-11 ENCOUNTER — Ambulatory Visit (INDEPENDENT_AMBULATORY_CARE_PROVIDER_SITE_OTHER): Payer: Medicare Other | Admitting: Internal Medicine

## 2014-05-11 ENCOUNTER — Telehealth: Payer: Self-pay | Admitting: Internal Medicine

## 2014-05-11 VITALS — BP 106/60 | HR 84 | Temp 98.3°F | Ht 67.0 in | Wt 128.0 lb

## 2014-05-11 DIAGNOSIS — R059 Cough, unspecified: Secondary | ICD-10-CM

## 2014-05-11 DIAGNOSIS — J449 Chronic obstructive pulmonary disease, unspecified: Secondary | ICD-10-CM

## 2014-05-11 DIAGNOSIS — Z720 Tobacco use: Secondary | ICD-10-CM

## 2014-05-11 DIAGNOSIS — R05 Cough: Secondary | ICD-10-CM

## 2014-05-11 DIAGNOSIS — F17200 Nicotine dependence, unspecified, uncomplicated: Secondary | ICD-10-CM

## 2014-05-11 LAB — PULMONARY FUNCTION TEST
DL/VA % pred: 34 %
DL/VA: 1.77 ml/min/mmHg/L
DLCO UNC % PRED: 38 %
DLCO unc: 10.95 ml/min/mmHg
FEF 25-75 POST: 0.52 L/s
FEF 25-75 PRE: 0.54 L/s
FEF2575-%Change-Post: -3 %
FEF2575-%Pred-Post: 25 %
FEF2575-%Pred-Pre: 26 %
FEV1-%Change-Post: 0 %
FEV1-%Pred-Post: 53 %
FEV1-%Pred-Pre: 53 %
FEV1-Post: 1.34 L
FEV1-Pre: 1.33 L
FEV1FVC-%Change-Post: 1 %
FEV1FVC-%Pred-Pre: 68 %
FEV6-%CHANGE-POST: -1 %
FEV6-%PRED-POST: 77 %
FEV6-%PRED-PRE: 78 %
FEV6-POST: 2.45 L
FEV6-Pre: 2.48 L
FEV6FVC-%CHANGE-POST: 0 %
FEV6FVC-%PRED-POST: 101 %
FEV6FVC-%Pred-Pre: 101 %
FVC-%CHANGE-POST: 0 %
FVC-%PRED-PRE: 77 %
FVC-%Pred-Post: 76 %
FVC-Post: 2.55 L
FVC-Pre: 2.57 L
PRE FEV1/FVC RATIO: 52 %
PRE FEV6/FVC RATIO: 96 %
Post FEV1/FVC ratio: 53 %
Post FEV6/FVC ratio: 96 %

## 2014-05-11 NOTE — Patient Instructions (Signed)
Follow up with Dr. Stevenson Clinch in 1 month - High Resolution CT prior to next visit - cont with Spiriva daily - cut back on smoking, eventually plan to stop all together

## 2014-05-11 NOTE — Progress Notes (Signed)
MRN# [id number] Lydia Clark 17-Jun-1940   CC:: "Followup of my lung function tests and chest x-ray" Chief Complaint  Patient presents with  . Follow-up    Pt here for f/u cxr, pft. Pt reports cough has gotten better.      Brief History: Synopsis: 74 year old female seen at [facility] [place] January 2016 for evaluation of cough.  Current every day smoker, past medical history of breast cancer status post lumpectomy and radiation to the left breast. PFTs FEV1/FVC 52, FEV1 53%, RV 474, TLC 250, moderate to severe obstruction with significant air trapping and hyperinflation, severe decrease in DLCO.  Noted to have a bronchitis episode in November and December of 2015 with subsequent cough and left-sided chest pain.  Patient is complaining of left-sided chest pain since her radiation to the left breast for cancer.  History of present illness  04/01/2014 - 74 yo female with PMHx as stated below (ESRD on PD), Left Breast Cancer, COPD (Clinical diagnosis), referred for evaluation of cough since November. Cough characteristics: - onset was Nov 2015, inciting factor - possible URI, mild productive , thought that she was having a mild touch of bronchitis, no fever, or chills.  However, in Dec 2015 continued to have cough, most clear sputum, was given a Zpak which initially helped with cough, but did not clear cough completely. - now with coughing spells (with mild sputum production, mostly clear), last a few seconds, on a daily basis. - no chills - has intermittent wheezing - no inciting factor to the cough currently.  -Follows with ENT for chronic sinusitis, currently on Allegra -Was told she had COPD and early 2000, has been off will Spiriva this about same time which has helped her shortness of breath.  Plan: Limited symptoms, cough which is actually improving, clinically COPD stage A or B, PFTs to objectively determine level of obstruction, 6 minute walk test, continue with Allegra,  continue with Spiriva  Events since last clinic visit: Patient presents today for followup visit of her pulmonary function testing, 6 minute walk testing, and two-view chest x-ray results. Patient states that since her last visit she's not had worsening cough, actually the cough has improved. She is currently back to baseline level of breathing, but today still complaining of left-sided chest discomfort at the site of her lumpectomy and radiation field. Patient does endorse persistent dyspnea on exertion, not worse over the last one month. Patient states that she is still smoking a pack a day.    PMHX:   Past Medical History  Diagnosis Date  . Allergy   . Cancer 2008    left breast  . Hypertension 2006  . Personal history of tobacco use, presenting hazards to health 2012    30 yrs smoking  . Endocrine disorder 2008    thyroid  . Personal history of malignant neoplasm of breast 2008  . Breast screening, unspecified 2013  . Special screening for malignant neoplasms, colon 2013  . COPD (chronic obstructive pulmonary disease)   . PE (pulmonary embolism)     patient denies  . Swelling of throat     swelling at base of throat,  . Anxiety     anxiety  . Pneumonia     hx  . Heart murmur     child  . History of kidney stones   . GERD (gastroesophageal reflux disease)   . Arthritis   . Multinodular goiter     followed by Dr. Carloyn Manner @ [facility]  .  Dialysis patient 2014  . Chronic kidney disease 2014    stage IV chronic    Surgical Hx:  Past Surgical History  Procedure Laterality Date  . Breast surgery Left 2008    lumpectomy  . Fooy Left     lft foot  . Foot surgery  2005  . 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 Tonsillectomy  2005  . Breast biopsy  2005  . Appendectomy  2005  . Replacement total knee Left 2012  . Colonoscopy  2007    done in [place]  . Insertion of dialysis catheter Right 07/02/2012    Procedure: INSERTION OF  DIALYSIS CATHETER;  Surgeon: Elam Dutch, MD;  Location: [place];  Service: Vascular;  Laterality: Right;  Right Internal Jugular  . Insertion of dialysis catheter Right July 02 2012    right chest/ temporary cath   Family Hx:  Family History  Problem Relation Age of Onset  . Cancer Other     breast cancer   Social Hx:   History  Substance Use Topics  . Smoking status: Current Every Day Smoker -- 1.00 packs/day for 30 years    Types: Cigarettes  . Smokeless tobacco: Never Used  . Alcohol Use: No   Medication:   Current Outpatient Rx  Name  Route  Sig  Dispense  Refill  . aspirin 81 MG tablet   Oral   Take 81 mg by mouth daily.         . Cholecalciferol (VITAMIN D3) 2000 UNITS TABS   Oral   Take 1 tablet by mouth daily.         . fexofenadine (ALLEGRA) 60 MG tablet   Oral   Take 60 mg by mouth daily as needed.       3   . gentamicin ointment (GARAMYCIN) 0.1 %   Topical   Apply 1 application topically at bedtime.       10   . LORazepam (ATIVAN) 1 MG tablet   Oral   Take 1 mg by mouth at bedtime as needed for anxiety.          Marland Kitchen omeprazole (PRILOSEC) 20 MG capsule   Oral   Take 20 mg by mouth daily.         . rosuvastatin (CRESTOR) 10 MG tablet   Oral   Take 10 mg by mouth daily.         Marland Kitchen tiotropium (SPIRIVA) 18 MCG inhalation capsule   Inhalation   Place 18 mcg into inhaler and inhale at bedtime.             Review of Systems: Gen:  Denies  fever, sweats, chills HEENT: Denies blurred vision, double vision, ear pain, eye pain, hearing loss, nose bleeds, sore throat Cvc:  No dizziness, chest pain or heaviness Resp:   Denies cough or sputum porduction, shortness of breath Gi: Denies swallowing difficulty, stomach pain, nausea or vomiting, diarrhea, constipation, bowel incontinence Gu:  Denies bladder incontinence, burning urine Ext:   No Joint pain, stiffness or swelling Skin: No skin rash, easy bruising or bleeding or hives Endoc:  No  polyuria, polydipsia , polyphagia or weight change Psych: No depression, insomnia or hallucinations  Other:  All other systems negative  Allergies:  Bee venom; Biaxin; Mycostatin; Polysorbate; and Sulfa antibiotics  Physical Examination:  VS: BP 106/60 mmHg  Pulse 84  Temp(Src) 98.3 F (36.8 C) (Oral)  Ht 5\' 7"  (1.702  m)  Wt 128 lb (58.06 kg)  BMI 20.04 kg/m2  SpO2 92%  General Appearance: No distress  Neuro: EXAM: without focal findings, mental status, speech normal, alert and oriented, cranial nerves 2-12 grossly normal  HEENT: PERRLA, EOM intact, no ptosis, no other lesions noticed Pulmonary:Exam: normal breath sounds., diaphragmatic excursion normal.No wheezing, No rales   Cardiovascular:@ Exam:  Normal S1,S2.  No m/r/g.     Abdomen:Exam: Benign, Soft, non-tender, No masses  Skin:   warm, no rashes, no ecchymosis  Extremities: normal, no cyanosis, clubbing, no edema, warm with normal capillary refill.   Labs results:  BMP Lab Results  Component Value Date   NA 137 07/02/2012   K 4.7 07/02/2012   GLUCOSE 87 07/02/2012     CBC CBC Latest Ref Rng 07/02/2012  Hemoglobin 12.0 - 15.0 g/dL 10.9(L)  Hematocrit 36.0 - 46.0 % 32.0(L)     Rad results:  Chest x-ray 2 views April 07 2013 FINDINGS: The lungs are hyperinflated with hemidiaphragm flattening. There is no focal infiltrate. There is no pleural effusion or pneumothorax. There is minimal scarring at the left lung base which is stable. The heart and pulmonary vascularity are normal. The mediastinum is normal in width. There is tortuosity of the descending thoracic aorta with calcification within its wall. There is gentle S-shaped thoracic scoliosis.  IMPRESSION: COPD. There is no active cardiopulmonary disease.    pulmonary function testing 05/11/2014:PFTs FEV1/FVC 52, FEV1 53%, RV 474, TLC 250, moderate to severe obstruction with significant air trapping and hyperinflation, severe decrease in DLCO.    Assessment and Plan:74 year old female past medical history of tobacco abuse, breast cancer status post lumpectomy with radiation following up for chronic dyspnea on exertion and cough. No problem-specific assessment & plan notes found for this encounter.   Updated Medication List Outpatient Encounter Prescriptions as of 05/11/2014  Medication Sig  . aspirin 81 MG tablet Take 81 mg by mouth daily.  . Cholecalciferol (VITAMIN D3) 2000 UNITS TABS Take 1 tablet by mouth daily.  . fexofenadine (ALLEGRA) 60 MG tablet Take 60 mg by mouth daily as needed.   Marland Kitchen gentamicin ointment (GARAMYCIN) 0.1 % Apply 1 application topically at bedtime.   Marland Kitchen LORazepam (ATIVAN) 1 MG tablet Take 1 mg by mouth at bedtime as needed for anxiety.   Marland Kitchen omeprazole (PRILOSEC) 20 MG capsule Take 20 mg by mouth daily.  . rosuvastatin (CRESTOR) 10 MG tablet Take 10 mg by mouth daily.  Marland Kitchen tiotropium (SPIRIVA) 18 MCG inhalation capsule Place 18 mcg into inhaler and inhale at bedtime.     Orders for this visit: Orders Placed This Encounter  Procedures  . CT Chest High Resolution    Standing Status: Future     Number of Occurrences:      Standing Expiration Date: 07/11/2015    Scheduling Instructions:     Please set patient up at [facility] in afternoon.    Order Specific Question:  Reason for Exam (SYMPTOM  OR DIAGNOSIS REQUIRED)    Answer:  cough    Order Specific Question:  Preferred imaging location?    Answer:  [facility]    Thank  you for the visitation and for allowing  [facility] to assist in the care of your patient. Our recommendations are noted above.  Please contact us if we can be of further service.  Vilinda Boehringer, MD [facility] Office Number: [phone]

## 2014-05-11 NOTE — Telephone Encounter (Signed)
Patient wants to change Nephrologist to Dr. Juleen China.  This is wrong on care team section of AVS.  Please change.

## 2014-05-11 NOTE — Progress Notes (Signed)
PFT performed today. 

## 2014-05-12 NOTE — Assessment & Plan Note (Signed)
Most likely a post infectious in combination with COPD and tobacco use - her cough is actually improving now, however given that she has persistent shortness of breath along with this cough and COPD history with radiation for breast cancer there is concern for underlying interstitial lung disease or early pulmonary fibrosis.  Plan: - tobacco cessation is paramount to suppressing cough - high-resolution CAT scan with contrast

## 2014-05-12 NOTE — Assessment & Plan Note (Signed)
Currently on Spiriva for a number of years. Give her limited symptoms, she is probably mild COPD (Stage A or B),  However based on objectively classic obstruction, her PFTs show moderate to severe obstruction with hyperinflation and air trapping. Given her current symptoms of mild cough and mild dyspnea on exertion, cough is improving, as her physician is at baseline, her symptoms are very mild and limited - monotherapy as indicated this patient, with the therapy were warned combination ICS/LABA. Given that patient had radiation therapy for breast cancer, persistent cough, persistent shortness of breath, left chest discomfort,-further evaluation with high-resolution CAT scan to determine underlying interstitial lung disease or early pulmonary fibrosis.  Plan - avoid tobacco -Continue Spiriva daily -High-resolution CAT scan with contrast

## 2014-05-12 NOTE — Assessment & Plan Note (Signed)
Tobacco Cessation - Counseling regarding benefits of smoking cessation strategies was provided for more than 12 min. - Educated that at this time smoking- cessation represents the single most important step that patient can take to enhance the length and quality of live. - Educated patient regarding alternatives of behavior interventions, pharmacotherapy including NRT and non-nicotine therapy such, and combinations of both. - patient still currently smoking one pack per day-I have spoken to her in great length and detail about her tobacco use and its relation to her current symptoms in addition to developing other complications of COPD and increasing her comorbidities. Patient verbalized understanding and stated she will try to cut back eventually quit on her own.

## 2014-05-15 NOTE — Telephone Encounter (Signed)
Callled patient to explain that nursing staff does not add/change care team provider. I would look into getting Dr. Juleen China added since she as switched care to [facility] from the [place] location.

## 2014-06-05 DIAGNOSIS — Z992 Dependence on renal dialysis: Secondary | ICD-10-CM | POA: Diagnosis not present

## 2014-06-05 DIAGNOSIS — E559 Vitamin D deficiency, unspecified: Secondary | ICD-10-CM | POA: Diagnosis not present

## 2014-06-05 DIAGNOSIS — D509 Iron deficiency anemia, unspecified: Secondary | ICD-10-CM | POA: Diagnosis not present

## 2014-06-05 DIAGNOSIS — N2581 Secondary hyperparathyroidism of renal origin: Secondary | ICD-10-CM | POA: Diagnosis not present

## 2014-06-05 DIAGNOSIS — N186 End stage renal disease: Secondary | ICD-10-CM | POA: Diagnosis not present

## 2014-06-11 ENCOUNTER — Ambulatory Visit: Admit: 2014-06-11 | Disposition: A | Discharge status: Home / Self Care | Payer: Self-pay | Admitting: Internal Medicine

## 2014-06-11 DIAGNOSIS — E042 Nontoxic multinodular goiter: Secondary | ICD-10-CM | POA: Diagnosis not present

## 2014-06-11 DIAGNOSIS — I2510 Atherosclerotic heart disease of native coronary artery without angina pectoris: Secondary | ICD-10-CM | POA: Diagnosis not present

## 2014-06-11 DIAGNOSIS — N289 Disorder of kidney and ureter, unspecified: Secondary | ICD-10-CM | POA: Diagnosis not present

## 2014-06-11 DIAGNOSIS — J432 Centrilobular emphysema: Secondary | ICD-10-CM | POA: Diagnosis not present

## 2014-06-16 ENCOUNTER — Encounter: Payer: Self-pay | Admitting: Internal Medicine

## 2014-06-16 ENCOUNTER — Ambulatory Visit (INDEPENDENT_AMBULATORY_CARE_PROVIDER_SITE_OTHER): Payer: Medicare Other | Admitting: Internal Medicine

## 2014-06-16 VITALS — BP 126/80 | HR 80 | Temp 98.4°F | Ht 67.0 in | Wt 130.0 lb

## 2014-06-16 DIAGNOSIS — R05 Cough: Secondary | ICD-10-CM

## 2014-06-16 DIAGNOSIS — J449 Chronic obstructive pulmonary disease, unspecified: Secondary | ICD-10-CM | POA: Diagnosis not present

## 2014-06-16 DIAGNOSIS — Z720 Tobacco use: Secondary | ICD-10-CM | POA: Diagnosis not present

## 2014-06-16 DIAGNOSIS — R059 Cough, unspecified: Secondary | ICD-10-CM

## 2014-06-16 DIAGNOSIS — F17200 Nicotine dependence, unspecified, uncomplicated: Secondary | ICD-10-CM

## 2014-06-16 MED ORDER — BUDESONIDE-FORMOTEROL FUMARATE 160-4.5 MCG/ACT IN AERO: 1.0000 | INHALATION_SPRAY | Freq: Two times a day (BID) | RESPIRATORY_TRACT | Status: DC

## 2014-06-16 NOTE — Progress Notes (Signed)
MRN# [id number] Lydia Clark [birth year]   CC: Chief Complaint  Patient presents with  . Follow-up    Pt c/o productive cough with clear to yellow mucus, wheezing, and sob. She denies chest tightness. Pt had a CT chest on 06/11/14. Pt is still smoking she is down to 1/2 pack daily.      Brief History: Synopsis: 74 year old female seen at [facility] January 2016 for evaluation of cough. Current every day smoker, past medical history of breast cancer status post lumpectomy and radiation to the left breast. PFTs FEV1/FVC 52, FEV1 53%, RV 474, TLC 250, moderate to severe obstruction with significant air trapping and hyperinflation, severe decrease in DLCO. Noted to have a bronchitis episode in November and December of 2015 with subsequent cough and left-sided chest pain. Patient is complaining of left-sided chest pain since her radiation to the left breast for cancer.  History of present illness 04/01/2014 - 74 yo female with PMHx as stated below (ESRD on PD), Left Breast Cancer, COPD (Clinical diagnosis), referred for evaluation of cough since November. Cough characteristics: - onset was Nov 2015, inciting factor - possible URI, mild productive , thought that she was having a mild touch of bronchitis, no fever, or chills. However, in Dec 2015 continued to have cough, most clear sputum, was given a Zpak which initially helped with cough, but did not clear cough completely. - now with coughing spells (with mild sputum production, mostly clear), last a few seconds, on a daily basis. - no chills - has intermittent wheezing - no inciting factor to the cough currently.  -Follows with ENT for chronic sinusitis, currently on Allegra -Was told she had COPD and early 2000, has been off will Spiriva this about same time which has helped her shortness of breath.  Plan: Limited symptoms, cough which is actually improving, clinically COPD stage A or B, PFTs to objectively determine level  of obstruction, 6 minute walk test, continue with Allegra, continue with Spiriva   ROV 05/11/14- followup visit of her pulmonary function testing, 6 minute walk testing, and two-view chest x-ray results. Patient states that since her last visit she's not had worsening cough, actually the cough has improved. She is currently back to baseline level of breathing, but today still complaining of left-sided chest discomfort at the site of her lumpectomy and radiation field. Patient does endorse persistent dyspnea on exertion, not worse over the last one month. Patient states that she is still smoking a pack a day. PLAN - HRCT, tobacco cessation, cont with spiriva  Events since last clinic visit:   Presents today for further evaluation of her cough. Since her last visit she's had a CT of her chest to evaluate for underlying interstitial lung disease. Her chest CT has shown severe centrilobular emphysema and enlarged thyroid and suprarenal cysts. Patient states since her last visit she's had another round of prednisone given to her by her nephrologist, without antibiotics at this time. She was complaining of thick yellow sputum and productive cough. Patient states she's currently on peritoneal dialysis, and on days that her cycler does not pull a lot of fluid she has worsening cough (mainly with laying flat). She is still smoking, 1/2 ppd.      PMHX:   Past Medical History  Diagnosis Date  . Allergy   . Cancer 2008    left breast  . Hypertension 2006  . Personal history of tobacco use, presenting hazards to health 2012    30 yrs smoking  .  Endocrine disorder 2008    thyroid  . Personal history of malignant neoplasm of breast 2008  . Breast screening, unspecified 2013  . Special screening for malignant neoplasms, colon 2013  . COPD (chronic obstructive pulmonary disease)   . PE (pulmonary embolism)     patient denies  . Swelling of throat     swelling at base of throat,  . Anxiety      anxiety  . Pneumonia     hx  . Heart murmur     child  . History of kidney stones   . GERD (gastroesophageal reflux disease)   . Arthritis   . Multinodular goiter     followed by Dr. Carloyn Manner @ [facility]  . Dialysis patient 2014  . Chronic kidney disease 2014    stage IV chronic    Surgical Hx:  Past Surgical History  Procedure Laterality Date  . Breast surgery Left 2008    lumpectomy  . Fooy Left     lft foot  . Foot surgery  2005  . 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 Tonsillectomy  2005  . Breast biopsy  2005  . Appendectomy  2005  . Replacement total knee Left 2012  . Colonoscopy  2007    done in [place]  . Insertion of dialysis catheter Right 07/02/2012    Procedure: INSERTION OF DIALYSIS CATHETER;  Surgeon: Elam Dutch, MD;  Location: [facility];  Service: Vascular;  Laterality: Right;  Right Internal Jugular  . Insertion of dialysis catheter Right July 02 2012    right chest/ temporary cath   Family Hx:  Family History  Problem Relation Age of Onset  . Cancer Other     breast cancer   Social Hx:   History  Substance Use Topics  . Smoking status: Current Every Day Smoker -- 1.00 packs/day for 30 years    Types: Cigarettes  . Smokeless tobacco: Never Used  . Alcohol Use: No   Medication:   Current Outpatient Rx  Name  Route  Sig  Dispense  Refill  . aspirin 81 MG tablet   Oral   Take 81 mg by mouth daily.         . Cholecalciferol (VITAMIN D3) 2000 UNITS TABS   Oral   Take 1 tablet by mouth daily.         . fexofenadine (ALLEGRA) 60 MG tablet   Oral   Take 60 mg by mouth daily as needed.       3   . gentamicin ointment (GARAMYCIN) 0.1 %   Topical   Apply 1 application topically at bedtime.       10   . LORazepam (ATIVAN) 1 MG tablet   Oral   Take 1 mg by mouth at bedtime as needed for anxiety.          Marland Kitchen omeprazole (PRILOSEC) 20 MG capsule   Oral   Take 20 mg by mouth daily.         .  rosuvastatin (CRESTOR) 10 MG tablet   Oral   Take 10 mg by mouth daily.         Marland Kitchen tiotropium (SPIRIVA) 18 MCG inhalation capsule   Inhalation   Place 18 mcg into inhaler and inhale at bedtime.             Review of Systems: Gen:  Denies  fever, sweats, chills HEENT: Denies blurred vision, double  vision, ear pain, eye pain, hearing loss, nose bleeds, sore throat Cvc:  No dizziness, chest pain or heaviness Resp:   Cough and mild shortness of breath Gi: Denies swallowing difficulty, stomach pain, nausea or vomiting, diarrhea, constipation, bowel incontinence Gu:  Denies bladder incontinence, burning urine Ext:   No Joint pain, stiffness or swelling Skin: No skin rash, easy bruising or bleeding or hives Endoc:  No polyuria, polydipsia , polyphagia or weight change Psych: No depression, insomnia or hallucinations  Other:  All other systems negative  Allergies:  Bee venom; Biaxin; Mycostatin; Polysorbate; and Sulfa antibiotics  Physical Examination:  VS: BP 126/80 mmHg  Pulse 80  Temp(Src) 98.4 F (36.9 C) (Oral)  Ht 5\' 7"  (1.702 m)  Wt 130 lb (58.968 kg)  BMI 20.36 kg/m2  SpO2 94%  General Appearance: No distress  HEENT: PERRLA, EOM intact, no ptosis, no other lesions noticed Pulmonary:Exam: normal breath sounds., diaphragmatic excursion normal.No wheezing, No rales   Cardiovascular:@ Exam:  Normal S1,S2.  No m/r/g.     Abdomen:Exam: Benign, Soft, non-tender, No masses  Skin:   warm, no rashes, no ecchymosis  Extremities: normal, no cyanosis, clubbing, no edema, warm with normal capillary refill.   Labs results:  BMP Lab Results  Component Value Date   NA 137 07/02/2012   K 4.7 07/02/2012   GLUCOSE 87 07/02/2012     CBC CBC Latest Ref Rng 07/02/2012  Hemoglobin 12.0 - 15.0 g/dL 10.9(L)  Hematocrit 36.0 - 46.0 % 32.0(L)     Rad results: (The following images and results were reviewed by Dr. Stevenson Clinch).  CT chest without contrast 06/11/2014 No findings suggestive  social lung disease. Mild scarring in the basal segments of the right lower lobe posteriorly, likely sequela of prior infection of prior aspiration. Mild diffuse bronchial wall thickening with severe centrilobular emphysema, imaging findings suggestive underlying COPD. Atherosclerosis, including left anterior descending coronary artery disease. Assessment for potential risk factor modification, diet therapy or pharmacologic therapy may be warranted, if clinically indicated. Multiple left renal lesions, relatively similar to remote prior study in September 2014, favored to represent a combination of simple cysts and proteinaceous or hemorrhagic cyst. These could definitely be characterized with MRI of the abdomen with and without IV gadolinium if of clinical concern. An large heterogeneous-appearing thyroid gland, with multiple nodules. CT findings and thyroid disease a highly nonspecific, and consideration for further evaluation with thyroid ultrasound is recommended.  Assessment and Plan: 74 year old female with past history of tobacco abuse, peritoneal dialysis, being seen evaluated for cough, no noted to have severe centrilobular emphysema on CT chest. COPD, mild Currently on Spiriva for a number of years. Give her limited symptoms, she is probably mild COPD (Stage A or B),  However based on objectively classic obstruction, her PFTs show moderate to severe obstruction with hyperinflation and air trapping. She seems a she had a recent episode of another COPD mild flare requiring steroid initiation. She's had to use steroid twice for this year already, and now her COPD is being classed as uncontrolled. In addition to Spiriva will start patient on Symbicort. Her recent high-resolution CT of chest without contrast shows severe emphysema, but there is no groundglass opacity in the area of her radiation field to suggest that this may be causing worsening cough.   Plan -Continue with Spiriva -We will  start Symbicort 160/8, 1 puff twice a day. Gargle and rinse after each use. -Tobacco avoidance -today we discussed the patient is unable to significantly reduce his  smoking or quit at her next follow-up visit we will start her on nicotine patches.     Cough Most likely due to continued smoking for this episode, and postinfectious. Noted to have severe centrilobular Zima on her recent high-resolution CT scan.  Plan -Tobacco avoidance -See plan for COPD.      Tobacco use disorder Tobacco Cessation - Counseling regarding benefits of smoking cessation strategies was provided for more than 12 min. - Educated that at this time smoking- cessation represents the single most important step that patient can take to enhance the length and quality of live. - Educated patient regarding alternatives of behavior interventions, pharmacotherapy including NRT and non-nicotine therapy such, and combinations of both. - patient needs currently down from 1 pack per day to half pack per day, we also discussed that if she is not able quit smoking or significantly decrease her smoking at her follow-up visit we will start her on nicotine patches.            Updated Medication List Outpatient Encounter Prescriptions as of 06/16/2014  Medication Sig  . aspirin 81 MG tablet Take 81 mg by mouth daily.  . Cholecalciferol (VITAMIN D3) 2000 UNITS TABS Take 1 tablet by mouth daily.  . fexofenadine (ALLEGRA) 60 MG tablet Take 60 mg by mouth daily as needed.   Marland Kitchen gentamicin ointment (GARAMYCIN) 0.1 % Apply 1 application topically at bedtime.   Marland Kitchen LORazepam (ATIVAN) 1 MG tablet Take 1 mg by mouth at bedtime as needed for anxiety.   Marland Kitchen omeprazole (PRILOSEC) 20 MG capsule Take 20 mg by mouth daily.  . rosuvastatin (CRESTOR) 10 MG tablet Take 10 mg by mouth daily.  Marland Kitchen tiotropium (SPIRIVA) 18 MCG inhalation capsule Place 18 mcg into inhaler and inhale at bedtime.     Orders for this visit: No orders of the  defined types were placed in this encounter.    Thank  you for the visitation and for allowing  [facility] to assist in the care of your patient. Our recommendations are noted above.  Please contact us if we can be of further service.  Vilinda Boehringer, MD [facility] Office Number: [phone]

## 2014-06-16 NOTE — Assessment & Plan Note (Signed)
Most likely due to continued smoking for this episode, and postinfectious. Noted to have severe centrilobular Zima on her recent high-resolution CT scan.  Plan -Tobacco avoidance -See plan for COPD.

## 2014-06-16 NOTE — Assessment & Plan Note (Signed)
Tobacco Cessation - Counseling regarding benefits of smoking cessation strategies was provided for more than 12 min. - Educated that at this time smoking- cessation represents the single most important step that patient can take to enhance the length and quality of live. - Educated patient regarding alternatives of behavior interventions, pharmacotherapy including NRT and non-nicotine therapy such, and combinations of both. - patient needs currently down from 1 pack per day to half pack per day, we also discussed that if she is not able quit smoking or significantly decrease her smoking at her follow-up visit we will start her on nicotine patches.

## 2014-06-16 NOTE — Patient Instructions (Addendum)
Follow up with Dr. Stevenson Clinch in 3 months - we will start you on Symbicort(160/8) - 1 puff in the AM and PM, gargle and rinse after each use - please stop smoking, this will help with you symptoms - continue with your spiriva - we will send a copy of the CT Chest results to Dr. Pryor Ochoa (ENT) and Dr. Abigail Butts (Nehprology) given the finding of enlarge thyroid and renal lesions.  - we will consider nicotine patches at your next visit, if you have not significantly reduce your smoking or quit.

## 2014-06-16 NOTE — Assessment & Plan Note (Signed)
Currently on Spiriva for a number of years. Give her limited symptoms, she is probably mild COPD (Stage A or B),  However based on objectively classic obstruction, her PFTs show moderate to severe obstruction with hyperinflation and air trapping. She seems a she had a recent episode of another COPD mild flare requiring steroid initiation. She's had to use steroid twice for this year already, and now her COPD is being classed as uncontrolled. In addition to Spiriva will start patient on Symbicort. Her recent high-resolution CT of chest without contrast shows severe emphysema, but there is no groundglass opacity in the area of her radiation field to suggest that this may be causing worsening cough.   Plan -Continue with Spiriva -We will start Symbicort 160/8, 1 puff twice a day. Gargle and rinse after each use. -Tobacco avoidance -today we discussed the patient is unable to significantly reduce his smoking or quit at her next follow-up visit we will start her on nicotine patches.

## 2014-06-17 ENCOUNTER — Other Ambulatory Visit: Payer: Self-pay | Admitting: *Deleted

## 2014-06-17 ENCOUNTER — Telehealth: Payer: Self-pay | Admitting: Internal Medicine

## 2014-06-17 DIAGNOSIS — R059 Cough, unspecified: Secondary | ICD-10-CM

## 2014-06-17 DIAGNOSIS — R05 Cough: Secondary | ICD-10-CM

## 2014-06-17 NOTE — Telephone Encounter (Signed)
Spoke with the pt  She is requesting copy of ct chest report from [facility]  Pt was seen by Dr Stevenson Clinch 06/16/14 and the results were reviewed in detail  Results printed from PACS mailed to her home address which I verified  Nothing further needed

## 2014-06-18 ENCOUNTER — Other Ambulatory Visit: Payer: Self-pay

## 2014-06-18 DIAGNOSIS — Z853 Personal history of malignant neoplasm of breast: Secondary | ICD-10-CM

## 2014-06-26 NOTE — Op Note (Signed)
PATIENT NAME:  Lydia Clark, Lydia Clark MR#:  [id number] DATE OF BIRTH:  03-23-1940  DATE OF PROCEDURE:  11/25/2012  PREOPERATIVE DIAGNOSES:  1.  End-stage renal disease.  2.  Nonfunctional peritoneal dialysis catheter.  3.  Chronic obstructive pulmonary disease.  4.  Hypertension.   POSTOPERATIVE DIAGNOSES: 1.  End-stage renal disease.  2.  Nonfunctional peritoneal dialysis catheter.  3.  Chronic obstructive pulmonary disease.  4.  Hypertension.   PROCEDURE: Laparoscopic revision of peritoneal dialysis catheter.   SURGEON: Leotis Pain, M.D.   ANESTHESIA: General.   ESTIMATED BLOOD LOSS: Minimal.   INDICATION FOR PROCEDURE: This is a 74 year old individual with end-stage renal disease. Her peritoneal dialysis catheter has not worked for the past several days and we are attempting to revise this to see if this can be functional for her permanent dialysis access. Risks and benefits were discussed. Informed consent was obtained.  DESCRIPTION OF PROCEDURE: The patient is brought to the operative suite and after an adequate level of general anesthesia was obtained, the abdomen was sterilely prepped and draped and a sterile surgical field was created. The 10 mm Optiview trocar was placed in the right upper quadrant under direct camera visualization. The abdomen was then insufflated and left lower quadrant port was placed under direct visualization. The catheter was found to be in the left upper quadrant around loops of adhesions from her previous surgical scar, which was a left subcostal incision. The pelvis was largely free of adhesions and the rest of the abdomen was pretty clear of adhesions, but the catheter then pulled into the left upper quadrant from the scar tissue. It was grasped. It was detangled from the loops of adhesions in the left upper quadrant and easily pulled down into the pelvis. There was some mild fibrinous exudate, which was flushed out without difficulty and the catheter flushed well  and drained reasonably well after it was placed in the pelvis. A 500 mL bag of saline ran in immediately and over half of this was returned immediately without difficulty. We directly visualized the catheter deep in the pelvis. We desufflated the abdomen under direct visualization watching this and then removed the trocars and camera. T\the incisions were then closed with 3-0 Vicryl and 4-0 Monocryl. Dermabond was placed as a dressing. The patient tolerated the procedure well and was taken to the recovery room in stable condition.   ____________________________ Algernon Huxley, MD jsd:aw D: 11/25/2012 14:35:30 ET T: 11/25/2012 14:47:51 ET JOB#: [id number]  cc: Algernon Huxley, MD, <Dictator> DR. Mansura PATIENT'S NEPHROLOGIST Algernon Huxley MD ELECTRONICALLY SIGNED 11/27/2012 12:58

## 2014-06-26 NOTE — Op Note (Signed)
PATIENT NAME:  Lydia Clark, Lydia Clark MR#:  [id number] DATE OF BIRTH:  1940/04/13  DATE OF PROCEDURE:  08/15/2012  PREOPERATIVE DIAGNOSES:  1. End-stage renal disease.  2. Chronic obstructive pulmonary disease.   POSTOPERATIVE DIAGNOSES:  1. End-stage renal disease.  2. Chronic obstructive pulmonary disease.   PROCEDURE: Laparoscopic placement of peritoneal dialysis catheter.   SURGEON: Algernon Huxley, M.D.   ANESTHESIA: General.   ESTIMATED BLOOD LOSS: Approximately 25 mL.   INDICATION FOR PROCEDURE: This is a 74 year old white female with recently new onset end-stage renal disease. She is going to do peritoneal dialysis long term, and a peritoneal dialysis catheter will need to be placed. The risks and benefits were discussed. Informed consent is obtained.   DESCRIPTION OF PROCEDURE: The patient is brought to the operative suite and after an adequate level of general anesthesia obtained, the abdomen was sterilely prepped and draped and a sterile surgical field was created. A small incision was created just to the left of the umbilicus and I dissected down to the fascia. At this level, a pursestring Vicryl suture was placed. I then placed a 10 mm Optiview port in the right upper quadrant under direct camera visualization, insufflated the abdomen. The catheter was then placed into the pelvis through the opening in the fascia in the middle of the pursestring Vicryl suture, parked in the pelvis and secured to the fascia with the pursestring suture. It was then brought out in the left lower abdomen through a counterincision with the second cuff about halfway between the initial incision and the exit site. The appropriate distal connectors were placed. Sterile saline 500 mL was then run in and 300 mL of effluent returned immediately. Catheter visualization in the pelvis was again done as we desufflated the abdomen. The camera was then removed. Both incisions were closed with 3-0 Vicryl and 4-0 Monocryl.  Dermabond was placed over these incisions and a dry dressing over the catheter exit site. The patient tolerated the procedure well and was taken to the recovery room in stable condition.    ____________________________ Algernon Huxley, MD jsd:gb D: 08/15/2012 16:33:17 ET T: 08/15/2012 21:13:07 ET JOB#: [id number]  cc: Algernon Huxley, MD, <Dictator> Fran Lowes, MD Algernon Huxley MD ELECTRONICALLY SIGNED 08/29/2012 14:40

## 2014-06-30 DIAGNOSIS — E041 Nontoxic single thyroid nodule: Secondary | ICD-10-CM | POA: Diagnosis not present

## 2014-06-30 DIAGNOSIS — R05 Cough: Secondary | ICD-10-CM | POA: Diagnosis not present

## 2014-07-05 DIAGNOSIS — N2581 Secondary hyperparathyroidism of renal origin: Secondary | ICD-10-CM | POA: Diagnosis not present

## 2014-07-05 DIAGNOSIS — Z992 Dependence on renal dialysis: Secondary | ICD-10-CM | POA: Diagnosis not present

## 2014-07-05 DIAGNOSIS — N186 End stage renal disease: Secondary | ICD-10-CM | POA: Diagnosis not present

## 2014-07-05 DIAGNOSIS — D509 Iron deficiency anemia, unspecified: Secondary | ICD-10-CM | POA: Diagnosis not present

## 2014-07-17 DIAGNOSIS — N63 Unspecified lump in breast: Secondary | ICD-10-CM | POA: Diagnosis not present

## 2014-07-17 DIAGNOSIS — N6001 Solitary cyst of right breast: Secondary | ICD-10-CM | POA: Diagnosis not present

## 2014-07-17 DIAGNOSIS — R922 Inconclusive mammogram: Secondary | ICD-10-CM | POA: Diagnosis not present

## 2014-07-17 DIAGNOSIS — Z853 Personal history of malignant neoplasm of breast: Secondary | ICD-10-CM | POA: Diagnosis not present

## 2014-07-22 ENCOUNTER — Other Ambulatory Visit: Payer: Medicare Other

## 2014-07-22 ENCOUNTER — Encounter: Payer: Self-pay | Admitting: General Surgery

## 2014-07-22 ENCOUNTER — Ambulatory Visit (INDEPENDENT_AMBULATORY_CARE_PROVIDER_SITE_OTHER): Payer: Medicare Other | Admitting: General Surgery

## 2014-07-22 VITALS — BP 140/78 | HR 79 | Resp 14 | Ht 67.0 in | Wt 131.0 lb

## 2014-07-22 DIAGNOSIS — N63 Unspecified lump in breast: Secondary | ICD-10-CM | POA: Diagnosis not present

## 2014-07-22 DIAGNOSIS — N6011 Diffuse cystic mastopathy of right breast: Secondary | ICD-10-CM

## 2014-07-22 DIAGNOSIS — N6310 Unspecified lump in the right breast, unspecified quadrant: Secondary | ICD-10-CM

## 2014-07-22 DIAGNOSIS — Z853 Personal history of malignant neoplasm of breast: Secondary | ICD-10-CM

## 2014-07-22 DIAGNOSIS — N6320 Unspecified lump in the left breast, unspecified quadrant: Secondary | ICD-10-CM

## 2014-07-22 DIAGNOSIS — N6032 Fibrosclerosis of left breast: Secondary | ICD-10-CM | POA: Diagnosis not present

## 2014-07-22 NOTE — Progress Notes (Signed)
Patient ID: Lydia Clark, female   DOB: Mar 05, 1941, 74 y.o.   MRN: [id number]  Chief Complaint  Patient presents with  . Follow-up    mammogram    HPI Lydia Clark is a 74 y.o. female with a history of left breast cancer who presents for a breast cancer follow up. The most recent mammogram was done on 07/17/14. Patient does perform regular self breast checks and gets regular mammograms done. She reports that her left breast continues to be tender.    HPI  Past Medical History  Diagnosis Date  . Allergy   . Cancer 2008    left breast  . Hypertension 2006  . Personal history of tobacco use, presenting hazards to health 2012    30 yrs smoking  . Endocrine disorder 2008    thyroid  . Personal history of malignant neoplasm of breast 2008  . Breast screening, unspecified 2013  . Special screening for malignant neoplasms, colon 2013  . COPD (chronic obstructive pulmonary disease)   . PE (pulmonary embolism)     patient denies  . Swelling of throat     swelling at base of throat,  . Anxiety     anxiety  . Pneumonia     hx  . Heart murmur     child  . History of kidney stones   . GERD (gastroesophageal reflux disease)   . Arthritis   . Multinodular goiter     followed by Dr. Carloyn Manner @ [facility]  . Dialysis patient 2014  . Chronic kidney disease 2014    stage IV chronic     Past Surgical History  Procedure Laterality Date  . Breast surgery Left 2008    lumpectomy  . Fooy Left     lft foot  . Foot surgery  2005  . 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 Tonsillectomy  2005  . Breast biopsy  2005  . Appendectomy  2005  . Replacement total knee Left 2012    Partial   . Colonoscopy  2007    done in [place]  . Insertion of dialysis catheter Right 07/02/2012    Procedure: INSERTION OF DIALYSIS CATHETER;  Surgeon: Elam Dutch, MD;  Location: [facility];  Service: Vascular;  Laterality: Right;  Right Internal Jugular  . Insertion of  dialysis catheter Right July 02 2012    right chest/ temporary cath    Family History  Problem Relation Age of Onset  . Cancer Other     breast cancer  . Heart attack Father   . Pneumonia Father     Social History History  Substance Use Topics  . Smoking status: Current Every Day Smoker -- 0.30 packs/day for 30 years    Types: Cigarettes  . Smokeless tobacco: Never Used  . Alcohol Use: No    Allergies  Allergen Reactions  . Bee Venom Swelling and Other (See Comments)    Breathing problems  . Biaxin [Clarithromycin] Other (See Comments)    Throat swells  . Mycostatin [Nystatin] Other (See Comments)    Blisters from the ointment  . Polysorbate     Other reaction(s): Unknown  . Sulfa Antibiotics Other (See Comments)    "welps"    Current Outpatient Prescriptions  Medication Sig Dispense Refill  . ADVAIR DISKUS 250-50 MCG/DOSE AEPB Inhale 2 puffs into the lungs 2 (two) times daily.  11  . albuterol (PROVENTIL HFA;VENTOLIN HFA) 108 (90 BASE)  MCG/ACT inhaler Inhale 2 puffs into the lungs every 6 (six) hours as needed for wheezing or shortness of breath.    Marland Kitchen aspirin 81 MG tablet Take 81 mg by mouth daily.    . budesonide-formoterol (SYMBICORT) 160-4.5 MCG/ACT inhaler Inhale 1 puff into the lungs 2 (two) times daily. 1 Inhaler 6  . Cholecalciferol (VITAMIN D3) 2000 UNITS TABS Take 1 tablet by mouth daily.    . fexofenadine (ALLEGRA) 60 MG tablet Take 60 mg by mouth 2 (two) times daily.   3  . gentamicin ointment (GARAMYCIN) 0.1 % Apply 1 application topically at bedtime.   10  . LORazepam (ATIVAN) 1 MG tablet Take 1 mg by mouth at bedtime as needed for anxiety.     Marland Kitchen omeprazole (PRILOSEC) 20 MG capsule Take 20 mg by mouth daily.    . rosuvastatin (CRESTOR) 10 MG tablet Take 10 mg by mouth daily.    Marland Kitchen tiotropium (SPIRIVA) 18 MCG inhalation capsule Place 18 mcg into inhaler and inhale at bedtime.      No current facility-administered medications for this visit.    Review  of Systems Review of Systems  Constitutional: Negative.   Respiratory: Negative.   Cardiovascular: Negative.     Blood pressure 140/78, pulse 79, resp. rate 14, height 5\' 7"  (1.702 m), weight 131 lb (59.421 kg).  Physical Exam Physical Exam  Constitutional: She is oriented to person, place, and time. She appears well-developed and well-nourished.  Eyes: Conjunctivae are normal. No scleral icterus.  Neck: Neck supple. Thyroid mass (2 cm nodule of left lower thyroid) present.  Cardiovascular: Normal rate, regular rhythm and normal heart sounds.   Pulmonary/Chest: Effort normal and breath sounds normal. Right breast exhibits no inverted nipple, no mass, no nipple discharge, no skin change and no tenderness. Left breast exhibits no inverted nipple, no mass, no nipple discharge, no skin change and no tenderness.    Mild thickening left lumpectomy site upper outer quadrant  Abdominal: Soft. Bowel sounds are normal. There is no hepatomegaly. There is no tenderness.  PD catheter in place  Lymphadenopathy:    She has no cervical adenopathy.    She has no axillary adenopathy.  Neurological: She is alert and oriented to person, place, and time.    Data Reviewed Mammogram and [place]- 35mm hypoechoic round nodule right breast 9 ocl, near areola. Qustionable mass at lumpectomy site  Assessment    Exam is stable. Abnormal imaging as above     Plan    With consent aspiration of right breast cyst and core biopsy of the vague shadowing mass in left breast complated today. Clip placed on left.  Breast cyst resolved fuly. If path on left breast ok will see her back in 1 yr with bil screening mammogram        Lydia Clark 07/22/2014, 2:35 PM

## 2014-07-22 NOTE — Patient Instructions (Signed)

## 2014-07-28 ENCOUNTER — Telehealth: Payer: Self-pay | Admitting: *Deleted

## 2014-07-28 NOTE — Telephone Encounter (Signed)
Notified patient as instructed, patient pleased. Discussed follow-up appointments, patient agrees  

## 2014-07-28 NOTE — Telephone Encounter (Signed)
-----   Message from Christene Lye, MD sent at 07/27/2014  7:26 AM EDT ----- Please let pt pt know the pathology was normal. Send copy of my last note and path report to [facility] radiology Please put pt back on 28yr f/u with bil mammogram

## 2014-07-29 ENCOUNTER — Ambulatory Visit (INDEPENDENT_AMBULATORY_CARE_PROVIDER_SITE_OTHER): Payer: Medicare Other | Admitting: *Deleted

## 2014-07-29 DIAGNOSIS — J449 Chronic obstructive pulmonary disease, unspecified: Secondary | ICD-10-CM | POA: Diagnosis not present

## 2014-07-29 DIAGNOSIS — E049 Nontoxic goiter, unspecified: Secondary | ICD-10-CM | POA: Diagnosis not present

## 2014-07-29 DIAGNOSIS — N63 Unspecified lump in breast: Secondary | ICD-10-CM

## 2014-07-29 DIAGNOSIS — I129 Hypertensive chronic kidney disease with stage 1 through stage 4 chronic kidney disease, or unspecified chronic kidney disease: Secondary | ICD-10-CM | POA: Diagnosis not present

## 2014-07-29 DIAGNOSIS — C50819 Malignant neoplasm of overlapping sites of unspecified female breast: Secondary | ICD-10-CM | POA: Diagnosis not present

## 2014-07-29 DIAGNOSIS — N6310 Unspecified lump in the right breast, unspecified quadrant: Secondary | ICD-10-CM

## 2014-07-29 NOTE — Progress Notes (Signed)
Patient came in today for a wound check/post breast biopsy.  The wound is clean, with no signs of infection noted. Minimal bruising. Aware of pathology.  Follow up as scheduled.

## 2014-07-29 NOTE — Patient Instructions (Signed)
The patient is aware to call back for any questions or concerns.  

## 2014-08-05 DIAGNOSIS — Z992 Dependence on renal dialysis: Secondary | ICD-10-CM | POA: Diagnosis not present

## 2014-08-05 DIAGNOSIS — D509 Iron deficiency anemia, unspecified: Secondary | ICD-10-CM | POA: Diagnosis not present

## 2014-08-05 DIAGNOSIS — N186 End stage renal disease: Secondary | ICD-10-CM | POA: Diagnosis not present

## 2014-08-06 DIAGNOSIS — D509 Iron deficiency anemia, unspecified: Secondary | ICD-10-CM | POA: Diagnosis not present

## 2014-08-06 DIAGNOSIS — Z992 Dependence on renal dialysis: Secondary | ICD-10-CM | POA: Diagnosis not present

## 2014-08-06 DIAGNOSIS — N186 End stage renal disease: Secondary | ICD-10-CM | POA: Diagnosis not present

## 2014-08-07 DIAGNOSIS — J449 Chronic obstructive pulmonary disease, unspecified: Secondary | ICD-10-CM | POA: Diagnosis not present

## 2014-08-07 DIAGNOSIS — N186 End stage renal disease: Secondary | ICD-10-CM | POA: Diagnosis not present

## 2014-08-07 DIAGNOSIS — Z992 Dependence on renal dialysis: Secondary | ICD-10-CM | POA: Diagnosis not present

## 2014-08-07 DIAGNOSIS — D509 Iron deficiency anemia, unspecified: Secondary | ICD-10-CM | POA: Diagnosis not present

## 2014-08-07 DIAGNOSIS — R05 Cough: Secondary | ICD-10-CM | POA: Diagnosis not present

## 2014-08-08 DIAGNOSIS — Z992 Dependence on renal dialysis: Secondary | ICD-10-CM | POA: Diagnosis not present

## 2014-08-08 DIAGNOSIS — N186 End stage renal disease: Secondary | ICD-10-CM | POA: Diagnosis not present

## 2014-08-08 DIAGNOSIS — D509 Iron deficiency anemia, unspecified: Secondary | ICD-10-CM | POA: Diagnosis not present

## 2014-08-09 DIAGNOSIS — D509 Iron deficiency anemia, unspecified: Secondary | ICD-10-CM | POA: Diagnosis not present

## 2014-08-09 DIAGNOSIS — N186 End stage renal disease: Secondary | ICD-10-CM | POA: Diagnosis not present

## 2014-08-09 DIAGNOSIS — Z992 Dependence on renal dialysis: Secondary | ICD-10-CM | POA: Diagnosis not present

## 2014-08-10 DIAGNOSIS — N186 End stage renal disease: Secondary | ICD-10-CM | POA: Diagnosis not present

## 2014-08-10 DIAGNOSIS — Z992 Dependence on renal dialysis: Secondary | ICD-10-CM | POA: Diagnosis not present

## 2014-08-10 DIAGNOSIS — D509 Iron deficiency anemia, unspecified: Secondary | ICD-10-CM | POA: Diagnosis not present

## 2014-08-11 DIAGNOSIS — N186 End stage renal disease: Secondary | ICD-10-CM | POA: Diagnosis not present

## 2014-08-11 DIAGNOSIS — Z992 Dependence on renal dialysis: Secondary | ICD-10-CM | POA: Diagnosis not present

## 2014-08-11 DIAGNOSIS — D509 Iron deficiency anemia, unspecified: Secondary | ICD-10-CM | POA: Diagnosis not present

## 2014-08-12 DIAGNOSIS — Z992 Dependence on renal dialysis: Secondary | ICD-10-CM | POA: Diagnosis not present

## 2014-08-12 DIAGNOSIS — N186 End stage renal disease: Secondary | ICD-10-CM | POA: Diagnosis not present

## 2014-08-12 DIAGNOSIS — D509 Iron deficiency anemia, unspecified: Secondary | ICD-10-CM | POA: Diagnosis not present

## 2014-08-13 DIAGNOSIS — N186 End stage renal disease: Secondary | ICD-10-CM | POA: Diagnosis not present

## 2014-08-13 DIAGNOSIS — D509 Iron deficiency anemia, unspecified: Secondary | ICD-10-CM | POA: Diagnosis not present

## 2014-08-13 DIAGNOSIS — Z992 Dependence on renal dialysis: Secondary | ICD-10-CM | POA: Diagnosis not present

## 2014-08-14 DIAGNOSIS — D509 Iron deficiency anemia, unspecified: Secondary | ICD-10-CM | POA: Diagnosis not present

## 2014-08-14 DIAGNOSIS — Z992 Dependence on renal dialysis: Secondary | ICD-10-CM | POA: Diagnosis not present

## 2014-08-14 DIAGNOSIS — N186 End stage renal disease: Secondary | ICD-10-CM | POA: Diagnosis not present

## 2014-08-15 DIAGNOSIS — D509 Iron deficiency anemia, unspecified: Secondary | ICD-10-CM | POA: Diagnosis not present

## 2014-08-15 DIAGNOSIS — Z992 Dependence on renal dialysis: Secondary | ICD-10-CM | POA: Diagnosis not present

## 2014-08-15 DIAGNOSIS — N186 End stage renal disease: Secondary | ICD-10-CM | POA: Diagnosis not present

## 2014-08-16 DIAGNOSIS — Z992 Dependence on renal dialysis: Secondary | ICD-10-CM | POA: Diagnosis not present

## 2014-08-16 DIAGNOSIS — D509 Iron deficiency anemia, unspecified: Secondary | ICD-10-CM | POA: Diagnosis not present

## 2014-08-16 DIAGNOSIS — N186 End stage renal disease: Secondary | ICD-10-CM | POA: Diagnosis not present

## 2014-08-17 DIAGNOSIS — N186 End stage renal disease: Secondary | ICD-10-CM | POA: Diagnosis not present

## 2014-08-17 DIAGNOSIS — D509 Iron deficiency anemia, unspecified: Secondary | ICD-10-CM | POA: Diagnosis not present

## 2014-08-17 DIAGNOSIS — Z992 Dependence on renal dialysis: Secondary | ICD-10-CM | POA: Diagnosis not present

## 2014-08-18 DIAGNOSIS — Z992 Dependence on renal dialysis: Secondary | ICD-10-CM | POA: Diagnosis not present

## 2014-08-18 DIAGNOSIS — N186 End stage renal disease: Secondary | ICD-10-CM | POA: Diagnosis not present

## 2014-08-18 DIAGNOSIS — D509 Iron deficiency anemia, unspecified: Secondary | ICD-10-CM | POA: Diagnosis not present

## 2014-08-19 DIAGNOSIS — N186 End stage renal disease: Secondary | ICD-10-CM | POA: Diagnosis not present

## 2014-08-19 DIAGNOSIS — D509 Iron deficiency anemia, unspecified: Secondary | ICD-10-CM | POA: Diagnosis not present

## 2014-08-19 DIAGNOSIS — Z992 Dependence on renal dialysis: Secondary | ICD-10-CM | POA: Diagnosis not present

## 2014-08-20 DIAGNOSIS — N186 End stage renal disease: Secondary | ICD-10-CM | POA: Diagnosis not present

## 2014-08-20 DIAGNOSIS — D509 Iron deficiency anemia, unspecified: Secondary | ICD-10-CM | POA: Diagnosis not present

## 2014-08-20 DIAGNOSIS — Z992 Dependence on renal dialysis: Secondary | ICD-10-CM | POA: Diagnosis not present

## 2014-08-21 DIAGNOSIS — D509 Iron deficiency anemia, unspecified: Secondary | ICD-10-CM | POA: Diagnosis not present

## 2014-08-21 DIAGNOSIS — N186 End stage renal disease: Secondary | ICD-10-CM | POA: Diagnosis not present

## 2014-08-21 DIAGNOSIS — Z992 Dependence on renal dialysis: Secondary | ICD-10-CM | POA: Diagnosis not present

## 2014-08-22 DIAGNOSIS — Z992 Dependence on renal dialysis: Secondary | ICD-10-CM | POA: Diagnosis not present

## 2014-08-22 DIAGNOSIS — D509 Iron deficiency anemia, unspecified: Secondary | ICD-10-CM | POA: Diagnosis not present

## 2014-08-22 DIAGNOSIS — N186 End stage renal disease: Secondary | ICD-10-CM | POA: Diagnosis not present

## 2014-08-23 DIAGNOSIS — Z992 Dependence on renal dialysis: Secondary | ICD-10-CM | POA: Diagnosis not present

## 2014-08-23 DIAGNOSIS — D509 Iron deficiency anemia, unspecified: Secondary | ICD-10-CM | POA: Diagnosis not present

## 2014-08-23 DIAGNOSIS — N186 End stage renal disease: Secondary | ICD-10-CM | POA: Diagnosis not present

## 2014-08-24 DIAGNOSIS — Z992 Dependence on renal dialysis: Secondary | ICD-10-CM | POA: Diagnosis not present

## 2014-08-24 DIAGNOSIS — N186 End stage renal disease: Secondary | ICD-10-CM | POA: Diagnosis not present

## 2014-08-24 DIAGNOSIS — D509 Iron deficiency anemia, unspecified: Secondary | ICD-10-CM | POA: Diagnosis not present

## 2014-08-25 DIAGNOSIS — D509 Iron deficiency anemia, unspecified: Secondary | ICD-10-CM | POA: Diagnosis not present

## 2014-08-25 DIAGNOSIS — N186 End stage renal disease: Secondary | ICD-10-CM | POA: Diagnosis not present

## 2014-08-25 DIAGNOSIS — Z992 Dependence on renal dialysis: Secondary | ICD-10-CM | POA: Diagnosis not present

## 2014-08-26 DIAGNOSIS — D509 Iron deficiency anemia, unspecified: Secondary | ICD-10-CM | POA: Diagnosis not present

## 2014-08-26 DIAGNOSIS — Z992 Dependence on renal dialysis: Secondary | ICD-10-CM | POA: Diagnosis not present

## 2014-08-26 DIAGNOSIS — N186 End stage renal disease: Secondary | ICD-10-CM | POA: Diagnosis not present

## 2014-08-27 DIAGNOSIS — D509 Iron deficiency anemia, unspecified: Secondary | ICD-10-CM | POA: Diagnosis not present

## 2014-08-27 DIAGNOSIS — Z992 Dependence on renal dialysis: Secondary | ICD-10-CM | POA: Diagnosis not present

## 2014-08-27 DIAGNOSIS — N186 End stage renal disease: Secondary | ICD-10-CM | POA: Diagnosis not present

## 2014-08-28 DIAGNOSIS — Z992 Dependence on renal dialysis: Secondary | ICD-10-CM | POA: Diagnosis not present

## 2014-08-28 DIAGNOSIS — N186 End stage renal disease: Secondary | ICD-10-CM | POA: Diagnosis not present

## 2014-08-28 DIAGNOSIS — D509 Iron deficiency anemia, unspecified: Secondary | ICD-10-CM | POA: Diagnosis not present

## 2014-08-29 DIAGNOSIS — D509 Iron deficiency anemia, unspecified: Secondary | ICD-10-CM | POA: Diagnosis not present

## 2014-08-29 DIAGNOSIS — N186 End stage renal disease: Secondary | ICD-10-CM | POA: Diagnosis not present

## 2014-08-29 DIAGNOSIS — Z992 Dependence on renal dialysis: Secondary | ICD-10-CM | POA: Diagnosis not present

## 2014-08-30 DIAGNOSIS — D509 Iron deficiency anemia, unspecified: Secondary | ICD-10-CM | POA: Diagnosis not present

## 2014-08-30 DIAGNOSIS — N186 End stage renal disease: Secondary | ICD-10-CM | POA: Diagnosis not present

## 2014-08-30 DIAGNOSIS — Z992 Dependence on renal dialysis: Secondary | ICD-10-CM | POA: Diagnosis not present

## 2014-08-31 DIAGNOSIS — Z992 Dependence on renal dialysis: Secondary | ICD-10-CM | POA: Diagnosis not present

## 2014-08-31 DIAGNOSIS — D509 Iron deficiency anemia, unspecified: Secondary | ICD-10-CM | POA: Diagnosis not present

## 2014-08-31 DIAGNOSIS — N186 End stage renal disease: Secondary | ICD-10-CM | POA: Diagnosis not present

## 2014-09-01 DIAGNOSIS — N186 End stage renal disease: Secondary | ICD-10-CM | POA: Diagnosis not present

## 2014-09-01 DIAGNOSIS — D509 Iron deficiency anemia, unspecified: Secondary | ICD-10-CM | POA: Diagnosis not present

## 2014-09-01 DIAGNOSIS — Z992 Dependence on renal dialysis: Secondary | ICD-10-CM | POA: Diagnosis not present

## 2014-09-02 DIAGNOSIS — N186 End stage renal disease: Secondary | ICD-10-CM | POA: Diagnosis not present

## 2014-09-02 DIAGNOSIS — D509 Iron deficiency anemia, unspecified: Secondary | ICD-10-CM | POA: Diagnosis not present

## 2014-09-02 DIAGNOSIS — Z992 Dependence on renal dialysis: Secondary | ICD-10-CM | POA: Diagnosis not present

## 2014-09-03 DIAGNOSIS — N186 End stage renal disease: Secondary | ICD-10-CM | POA: Diagnosis not present

## 2014-09-03 DIAGNOSIS — Z992 Dependence on renal dialysis: Secondary | ICD-10-CM | POA: Diagnosis not present

## 2014-09-03 DIAGNOSIS — D509 Iron deficiency anemia, unspecified: Secondary | ICD-10-CM | POA: Diagnosis not present

## 2014-09-04 DIAGNOSIS — N186 End stage renal disease: Secondary | ICD-10-CM | POA: Diagnosis not present

## 2014-09-04 DIAGNOSIS — D509 Iron deficiency anemia, unspecified: Secondary | ICD-10-CM | POA: Diagnosis not present

## 2014-09-04 DIAGNOSIS — D631 Anemia in chronic kidney disease: Secondary | ICD-10-CM | POA: Diagnosis not present

## 2014-09-04 DIAGNOSIS — Z992 Dependence on renal dialysis: Secondary | ICD-10-CM | POA: Diagnosis not present

## 2014-10-05 DIAGNOSIS — D509 Iron deficiency anemia, unspecified: Secondary | ICD-10-CM | POA: Diagnosis not present

## 2014-10-05 DIAGNOSIS — E559 Vitamin D deficiency, unspecified: Secondary | ICD-10-CM | POA: Diagnosis not present

## 2014-10-05 DIAGNOSIS — N2581 Secondary hyperparathyroidism of renal origin: Secondary | ICD-10-CM | POA: Diagnosis not present

## 2014-10-05 DIAGNOSIS — Z992 Dependence on renal dialysis: Secondary | ICD-10-CM | POA: Diagnosis not present

## 2014-10-05 DIAGNOSIS — D631 Anemia in chronic kidney disease: Secondary | ICD-10-CM | POA: Diagnosis not present

## 2014-10-05 DIAGNOSIS — N186 End stage renal disease: Secondary | ICD-10-CM | POA: Diagnosis not present

## 2014-10-29 DIAGNOSIS — I129 Hypertensive chronic kidney disease with stage 1 through stage 4 chronic kidney disease, or unspecified chronic kidney disease: Secondary | ICD-10-CM | POA: Diagnosis not present

## 2014-10-29 DIAGNOSIS — K219 Gastro-esophageal reflux disease without esophagitis: Secondary | ICD-10-CM | POA: Diagnosis not present

## 2014-10-29 DIAGNOSIS — J449 Chronic obstructive pulmonary disease, unspecified: Secondary | ICD-10-CM | POA: Diagnosis not present

## 2014-10-29 DIAGNOSIS — I10 Essential (primary) hypertension: Secondary | ICD-10-CM | POA: Diagnosis not present

## 2014-11-05 DIAGNOSIS — D509 Iron deficiency anemia, unspecified: Secondary | ICD-10-CM | POA: Diagnosis not present

## 2014-11-05 DIAGNOSIS — Z23 Encounter for immunization: Secondary | ICD-10-CM | POA: Diagnosis not present

## 2014-11-05 DIAGNOSIS — N186 End stage renal disease: Secondary | ICD-10-CM | POA: Diagnosis not present

## 2014-11-05 DIAGNOSIS — Z992 Dependence on renal dialysis: Secondary | ICD-10-CM | POA: Diagnosis not present

## 2014-11-05 DIAGNOSIS — D631 Anemia in chronic kidney disease: Secondary | ICD-10-CM | POA: Diagnosis not present

## 2014-11-06 DIAGNOSIS — D631 Anemia in chronic kidney disease: Secondary | ICD-10-CM | POA: Diagnosis not present

## 2014-11-06 DIAGNOSIS — D509 Iron deficiency anemia, unspecified: Secondary | ICD-10-CM | POA: Diagnosis not present

## 2014-11-06 DIAGNOSIS — Z23 Encounter for immunization: Secondary | ICD-10-CM | POA: Diagnosis not present

## 2014-11-06 DIAGNOSIS — N186 End stage renal disease: Secondary | ICD-10-CM | POA: Diagnosis not present

## 2014-11-06 DIAGNOSIS — Z992 Dependence on renal dialysis: Secondary | ICD-10-CM | POA: Diagnosis not present

## 2014-11-07 DIAGNOSIS — Z23 Encounter for immunization: Secondary | ICD-10-CM | POA: Diagnosis not present

## 2014-11-07 DIAGNOSIS — Z992 Dependence on renal dialysis: Secondary | ICD-10-CM | POA: Diagnosis not present

## 2014-11-07 DIAGNOSIS — D509 Iron deficiency anemia, unspecified: Secondary | ICD-10-CM | POA: Diagnosis not present

## 2014-11-07 DIAGNOSIS — N186 End stage renal disease: Secondary | ICD-10-CM | POA: Diagnosis not present

## 2014-11-07 DIAGNOSIS — D631 Anemia in chronic kidney disease: Secondary | ICD-10-CM | POA: Diagnosis not present

## 2014-11-08 DIAGNOSIS — D509 Iron deficiency anemia, unspecified: Secondary | ICD-10-CM | POA: Diagnosis not present

## 2014-11-08 DIAGNOSIS — D631 Anemia in chronic kidney disease: Secondary | ICD-10-CM | POA: Diagnosis not present

## 2014-11-08 DIAGNOSIS — Z23 Encounter for immunization: Secondary | ICD-10-CM | POA: Diagnosis not present

## 2014-11-08 DIAGNOSIS — N186 End stage renal disease: Secondary | ICD-10-CM | POA: Diagnosis not present

## 2014-11-08 DIAGNOSIS — Z992 Dependence on renal dialysis: Secondary | ICD-10-CM | POA: Diagnosis not present

## 2014-11-09 DIAGNOSIS — Z992 Dependence on renal dialysis: Secondary | ICD-10-CM | POA: Diagnosis not present

## 2014-11-09 DIAGNOSIS — N186 End stage renal disease: Secondary | ICD-10-CM | POA: Diagnosis not present

## 2014-11-09 DIAGNOSIS — D631 Anemia in chronic kidney disease: Secondary | ICD-10-CM | POA: Diagnosis not present

## 2014-11-09 DIAGNOSIS — D509 Iron deficiency anemia, unspecified: Secondary | ICD-10-CM | POA: Diagnosis not present

## 2014-11-09 DIAGNOSIS — Z23 Encounter for immunization: Secondary | ICD-10-CM | POA: Diagnosis not present

## 2014-11-10 DIAGNOSIS — N186 End stage renal disease: Secondary | ICD-10-CM | POA: Diagnosis not present

## 2014-11-10 DIAGNOSIS — Z23 Encounter for immunization: Secondary | ICD-10-CM | POA: Diagnosis not present

## 2014-11-10 DIAGNOSIS — Z992 Dependence on renal dialysis: Secondary | ICD-10-CM | POA: Diagnosis not present

## 2014-11-10 DIAGNOSIS — D631 Anemia in chronic kidney disease: Secondary | ICD-10-CM | POA: Diagnosis not present

## 2014-11-10 DIAGNOSIS — D509 Iron deficiency anemia, unspecified: Secondary | ICD-10-CM | POA: Diagnosis not present

## 2014-11-11 DIAGNOSIS — D509 Iron deficiency anemia, unspecified: Secondary | ICD-10-CM | POA: Diagnosis not present

## 2014-11-11 DIAGNOSIS — Z992 Dependence on renal dialysis: Secondary | ICD-10-CM | POA: Diagnosis not present

## 2014-11-11 DIAGNOSIS — N186 End stage renal disease: Secondary | ICD-10-CM | POA: Diagnosis not present

## 2014-11-11 DIAGNOSIS — Z23 Encounter for immunization: Secondary | ICD-10-CM | POA: Diagnosis not present

## 2014-11-11 DIAGNOSIS — D631 Anemia in chronic kidney disease: Secondary | ICD-10-CM | POA: Diagnosis not present

## 2014-11-12 DIAGNOSIS — Z23 Encounter for immunization: Secondary | ICD-10-CM | POA: Diagnosis not present

## 2014-11-12 DIAGNOSIS — D631 Anemia in chronic kidney disease: Secondary | ICD-10-CM | POA: Diagnosis not present

## 2014-11-12 DIAGNOSIS — N186 End stage renal disease: Secondary | ICD-10-CM | POA: Diagnosis not present

## 2014-11-12 DIAGNOSIS — D509 Iron deficiency anemia, unspecified: Secondary | ICD-10-CM | POA: Diagnosis not present

## 2014-11-12 DIAGNOSIS — Z992 Dependence on renal dialysis: Secondary | ICD-10-CM | POA: Diagnosis not present

## 2014-11-13 DIAGNOSIS — D509 Iron deficiency anemia, unspecified: Secondary | ICD-10-CM | POA: Diagnosis not present

## 2014-11-13 DIAGNOSIS — N186 End stage renal disease: Secondary | ICD-10-CM | POA: Diagnosis not present

## 2014-11-13 DIAGNOSIS — Z23 Encounter for immunization: Secondary | ICD-10-CM | POA: Diagnosis not present

## 2014-11-13 DIAGNOSIS — D631 Anemia in chronic kidney disease: Secondary | ICD-10-CM | POA: Diagnosis not present

## 2014-11-13 DIAGNOSIS — Z992 Dependence on renal dialysis: Secondary | ICD-10-CM | POA: Diagnosis not present

## 2014-11-14 DIAGNOSIS — Z992 Dependence on renal dialysis: Secondary | ICD-10-CM | POA: Diagnosis not present

## 2014-11-14 DIAGNOSIS — Z23 Encounter for immunization: Secondary | ICD-10-CM | POA: Diagnosis not present

## 2014-11-14 DIAGNOSIS — D631 Anemia in chronic kidney disease: Secondary | ICD-10-CM | POA: Diagnosis not present

## 2014-11-14 DIAGNOSIS — D509 Iron deficiency anemia, unspecified: Secondary | ICD-10-CM | POA: Diagnosis not present

## 2014-11-14 DIAGNOSIS — N186 End stage renal disease: Secondary | ICD-10-CM | POA: Diagnosis not present

## 2014-11-15 DIAGNOSIS — N186 End stage renal disease: Secondary | ICD-10-CM | POA: Diagnosis not present

## 2014-11-15 DIAGNOSIS — D631 Anemia in chronic kidney disease: Secondary | ICD-10-CM | POA: Diagnosis not present

## 2014-11-15 DIAGNOSIS — Z23 Encounter for immunization: Secondary | ICD-10-CM | POA: Diagnosis not present

## 2014-11-15 DIAGNOSIS — D509 Iron deficiency anemia, unspecified: Secondary | ICD-10-CM | POA: Diagnosis not present

## 2014-11-15 DIAGNOSIS — Z992 Dependence on renal dialysis: Secondary | ICD-10-CM | POA: Diagnosis not present

## 2014-11-16 DIAGNOSIS — N186 End stage renal disease: Secondary | ICD-10-CM | POA: Diagnosis not present

## 2014-11-16 DIAGNOSIS — Z992 Dependence on renal dialysis: Secondary | ICD-10-CM | POA: Diagnosis not present

## 2014-11-16 DIAGNOSIS — D509 Iron deficiency anemia, unspecified: Secondary | ICD-10-CM | POA: Diagnosis not present

## 2014-11-16 DIAGNOSIS — D631 Anemia in chronic kidney disease: Secondary | ICD-10-CM | POA: Diagnosis not present

## 2014-11-16 DIAGNOSIS — Z23 Encounter for immunization: Secondary | ICD-10-CM | POA: Diagnosis not present

## 2014-11-17 DIAGNOSIS — Z23 Encounter for immunization: Secondary | ICD-10-CM | POA: Diagnosis not present

## 2014-11-17 DIAGNOSIS — N186 End stage renal disease: Secondary | ICD-10-CM | POA: Diagnosis not present

## 2014-11-17 DIAGNOSIS — Z992 Dependence on renal dialysis: Secondary | ICD-10-CM | POA: Diagnosis not present

## 2014-11-17 DIAGNOSIS — D631 Anemia in chronic kidney disease: Secondary | ICD-10-CM | POA: Diagnosis not present

## 2014-11-17 DIAGNOSIS — D509 Iron deficiency anemia, unspecified: Secondary | ICD-10-CM | POA: Diagnosis not present

## 2014-11-18 DIAGNOSIS — N186 End stage renal disease: Secondary | ICD-10-CM | POA: Diagnosis not present

## 2014-11-18 DIAGNOSIS — D631 Anemia in chronic kidney disease: Secondary | ICD-10-CM | POA: Diagnosis not present

## 2014-11-18 DIAGNOSIS — D509 Iron deficiency anemia, unspecified: Secondary | ICD-10-CM | POA: Diagnosis not present

## 2014-11-18 DIAGNOSIS — Z23 Encounter for immunization: Secondary | ICD-10-CM | POA: Diagnosis not present

## 2014-11-18 DIAGNOSIS — Z992 Dependence on renal dialysis: Secondary | ICD-10-CM | POA: Diagnosis not present

## 2014-11-19 DIAGNOSIS — D631 Anemia in chronic kidney disease: Secondary | ICD-10-CM | POA: Diagnosis not present

## 2014-11-19 DIAGNOSIS — Z23 Encounter for immunization: Secondary | ICD-10-CM | POA: Diagnosis not present

## 2014-11-19 DIAGNOSIS — D509 Iron deficiency anemia, unspecified: Secondary | ICD-10-CM | POA: Diagnosis not present

## 2014-11-19 DIAGNOSIS — Z992 Dependence on renal dialysis: Secondary | ICD-10-CM | POA: Diagnosis not present

## 2014-11-19 DIAGNOSIS — N186 End stage renal disease: Secondary | ICD-10-CM | POA: Diagnosis not present

## 2014-11-20 DIAGNOSIS — Z992 Dependence on renal dialysis: Secondary | ICD-10-CM | POA: Diagnosis not present

## 2014-11-20 DIAGNOSIS — D509 Iron deficiency anemia, unspecified: Secondary | ICD-10-CM | POA: Diagnosis not present

## 2014-11-20 DIAGNOSIS — D631 Anemia in chronic kidney disease: Secondary | ICD-10-CM | POA: Diagnosis not present

## 2014-11-20 DIAGNOSIS — N186 End stage renal disease: Secondary | ICD-10-CM | POA: Diagnosis not present

## 2014-11-20 DIAGNOSIS — Z23 Encounter for immunization: Secondary | ICD-10-CM | POA: Diagnosis not present

## 2014-11-21 DIAGNOSIS — Z992 Dependence on renal dialysis: Secondary | ICD-10-CM | POA: Diagnosis not present

## 2014-11-21 DIAGNOSIS — D509 Iron deficiency anemia, unspecified: Secondary | ICD-10-CM | POA: Diagnosis not present

## 2014-11-21 DIAGNOSIS — Z23 Encounter for immunization: Secondary | ICD-10-CM | POA: Diagnosis not present

## 2014-11-21 DIAGNOSIS — N186 End stage renal disease: Secondary | ICD-10-CM | POA: Diagnosis not present

## 2014-11-21 DIAGNOSIS — D631 Anemia in chronic kidney disease: Secondary | ICD-10-CM | POA: Diagnosis not present

## 2014-11-22 DIAGNOSIS — N186 End stage renal disease: Secondary | ICD-10-CM | POA: Diagnosis not present

## 2014-11-22 DIAGNOSIS — Z23 Encounter for immunization: Secondary | ICD-10-CM | POA: Diagnosis not present

## 2014-11-22 DIAGNOSIS — D509 Iron deficiency anemia, unspecified: Secondary | ICD-10-CM | POA: Diagnosis not present

## 2014-11-22 DIAGNOSIS — D631 Anemia in chronic kidney disease: Secondary | ICD-10-CM | POA: Diagnosis not present

## 2014-11-22 DIAGNOSIS — Z992 Dependence on renal dialysis: Secondary | ICD-10-CM | POA: Diagnosis not present

## 2014-11-23 DIAGNOSIS — Z992 Dependence on renal dialysis: Secondary | ICD-10-CM | POA: Diagnosis not present

## 2014-11-23 DIAGNOSIS — D631 Anemia in chronic kidney disease: Secondary | ICD-10-CM | POA: Diagnosis not present

## 2014-11-23 DIAGNOSIS — N186 End stage renal disease: Secondary | ICD-10-CM | POA: Diagnosis not present

## 2014-11-23 DIAGNOSIS — Z23 Encounter for immunization: Secondary | ICD-10-CM | POA: Diagnosis not present

## 2014-11-23 DIAGNOSIS — D509 Iron deficiency anemia, unspecified: Secondary | ICD-10-CM | POA: Diagnosis not present

## 2014-11-24 DIAGNOSIS — Z992 Dependence on renal dialysis: Secondary | ICD-10-CM | POA: Diagnosis not present

## 2014-11-24 DIAGNOSIS — D509 Iron deficiency anemia, unspecified: Secondary | ICD-10-CM | POA: Diagnosis not present

## 2014-11-24 DIAGNOSIS — D631 Anemia in chronic kidney disease: Secondary | ICD-10-CM | POA: Diagnosis not present

## 2014-11-24 DIAGNOSIS — Z23 Encounter for immunization: Secondary | ICD-10-CM | POA: Diagnosis not present

## 2014-11-24 DIAGNOSIS — T148 Other injury of unspecified body region: Secondary | ICD-10-CM | POA: Diagnosis not present

## 2014-11-24 DIAGNOSIS — N186 End stage renal disease: Secondary | ICD-10-CM | POA: Diagnosis not present

## 2014-11-25 DIAGNOSIS — D509 Iron deficiency anemia, unspecified: Secondary | ICD-10-CM | POA: Diagnosis not present

## 2014-11-25 DIAGNOSIS — D631 Anemia in chronic kidney disease: Secondary | ICD-10-CM | POA: Diagnosis not present

## 2014-11-25 DIAGNOSIS — N186 End stage renal disease: Secondary | ICD-10-CM | POA: Diagnosis not present

## 2014-11-25 DIAGNOSIS — Z992 Dependence on renal dialysis: Secondary | ICD-10-CM | POA: Diagnosis not present

## 2014-11-25 DIAGNOSIS — Z23 Encounter for immunization: Secondary | ICD-10-CM | POA: Diagnosis not present

## 2014-11-26 DIAGNOSIS — N186 End stage renal disease: Secondary | ICD-10-CM | POA: Diagnosis not present

## 2014-11-26 DIAGNOSIS — Z992 Dependence on renal dialysis: Secondary | ICD-10-CM | POA: Diagnosis not present

## 2014-11-26 DIAGNOSIS — D509 Iron deficiency anemia, unspecified: Secondary | ICD-10-CM | POA: Diagnosis not present

## 2014-11-26 DIAGNOSIS — D631 Anemia in chronic kidney disease: Secondary | ICD-10-CM | POA: Diagnosis not present

## 2014-11-26 DIAGNOSIS — Z23 Encounter for immunization: Secondary | ICD-10-CM | POA: Diagnosis not present

## 2014-11-27 DIAGNOSIS — D631 Anemia in chronic kidney disease: Secondary | ICD-10-CM | POA: Diagnosis not present

## 2014-11-27 DIAGNOSIS — D509 Iron deficiency anemia, unspecified: Secondary | ICD-10-CM | POA: Diagnosis not present

## 2014-11-27 DIAGNOSIS — Z992 Dependence on renal dialysis: Secondary | ICD-10-CM | POA: Diagnosis not present

## 2014-11-27 DIAGNOSIS — N186 End stage renal disease: Secondary | ICD-10-CM | POA: Diagnosis not present

## 2014-11-27 DIAGNOSIS — Z23 Encounter for immunization: Secondary | ICD-10-CM | POA: Diagnosis not present

## 2014-11-28 DIAGNOSIS — D509 Iron deficiency anemia, unspecified: Secondary | ICD-10-CM | POA: Diagnosis not present

## 2014-11-28 DIAGNOSIS — Z23 Encounter for immunization: Secondary | ICD-10-CM | POA: Diagnosis not present

## 2014-11-28 DIAGNOSIS — N186 End stage renal disease: Secondary | ICD-10-CM | POA: Diagnosis not present

## 2014-11-28 DIAGNOSIS — D631 Anemia in chronic kidney disease: Secondary | ICD-10-CM | POA: Diagnosis not present

## 2014-11-28 DIAGNOSIS — Z992 Dependence on renal dialysis: Secondary | ICD-10-CM | POA: Diagnosis not present

## 2014-11-29 DIAGNOSIS — D631 Anemia in chronic kidney disease: Secondary | ICD-10-CM | POA: Diagnosis not present

## 2014-11-29 DIAGNOSIS — D509 Iron deficiency anemia, unspecified: Secondary | ICD-10-CM | POA: Diagnosis not present

## 2014-11-29 DIAGNOSIS — N186 End stage renal disease: Secondary | ICD-10-CM | POA: Diagnosis not present

## 2014-11-29 DIAGNOSIS — Z992 Dependence on renal dialysis: Secondary | ICD-10-CM | POA: Diagnosis not present

## 2014-11-29 DIAGNOSIS — Z23 Encounter for immunization: Secondary | ICD-10-CM | POA: Diagnosis not present

## 2014-11-30 DIAGNOSIS — D509 Iron deficiency anemia, unspecified: Secondary | ICD-10-CM | POA: Diagnosis not present

## 2014-11-30 DIAGNOSIS — Z992 Dependence on renal dialysis: Secondary | ICD-10-CM | POA: Diagnosis not present

## 2014-11-30 DIAGNOSIS — Z23 Encounter for immunization: Secondary | ICD-10-CM | POA: Diagnosis not present

## 2014-11-30 DIAGNOSIS — D631 Anemia in chronic kidney disease: Secondary | ICD-10-CM | POA: Diagnosis not present

## 2014-11-30 DIAGNOSIS — N186 End stage renal disease: Secondary | ICD-10-CM | POA: Diagnosis not present

## 2014-12-01 DIAGNOSIS — D631 Anemia in chronic kidney disease: Secondary | ICD-10-CM | POA: Diagnosis not present

## 2014-12-01 DIAGNOSIS — D509 Iron deficiency anemia, unspecified: Secondary | ICD-10-CM | POA: Diagnosis not present

## 2014-12-01 DIAGNOSIS — N186 End stage renal disease: Secondary | ICD-10-CM | POA: Diagnosis not present

## 2014-12-01 DIAGNOSIS — Z992 Dependence on renal dialysis: Secondary | ICD-10-CM | POA: Diagnosis not present

## 2014-12-01 DIAGNOSIS — Z23 Encounter for immunization: Secondary | ICD-10-CM | POA: Diagnosis not present

## 2014-12-02 DIAGNOSIS — D509 Iron deficiency anemia, unspecified: Secondary | ICD-10-CM | POA: Diagnosis not present

## 2014-12-02 DIAGNOSIS — Z992 Dependence on renal dialysis: Secondary | ICD-10-CM | POA: Diagnosis not present

## 2014-12-02 DIAGNOSIS — N186 End stage renal disease: Secondary | ICD-10-CM | POA: Diagnosis not present

## 2014-12-02 DIAGNOSIS — Z23 Encounter for immunization: Secondary | ICD-10-CM | POA: Diagnosis not present

## 2014-12-02 DIAGNOSIS — D631 Anemia in chronic kidney disease: Secondary | ICD-10-CM | POA: Diagnosis not present

## 2014-12-03 DIAGNOSIS — Z23 Encounter for immunization: Secondary | ICD-10-CM | POA: Diagnosis not present

## 2014-12-03 DIAGNOSIS — N186 End stage renal disease: Secondary | ICD-10-CM | POA: Diagnosis not present

## 2014-12-03 DIAGNOSIS — D509 Iron deficiency anemia, unspecified: Secondary | ICD-10-CM | POA: Diagnosis not present

## 2014-12-03 DIAGNOSIS — D631 Anemia in chronic kidney disease: Secondary | ICD-10-CM | POA: Diagnosis not present

## 2014-12-03 DIAGNOSIS — Z992 Dependence on renal dialysis: Secondary | ICD-10-CM | POA: Diagnosis not present

## 2014-12-04 DIAGNOSIS — Z992 Dependence on renal dialysis: Secondary | ICD-10-CM | POA: Diagnosis not present

## 2014-12-04 DIAGNOSIS — D631 Anemia in chronic kidney disease: Secondary | ICD-10-CM | POA: Diagnosis not present

## 2014-12-04 DIAGNOSIS — N186 End stage renal disease: Secondary | ICD-10-CM | POA: Diagnosis not present

## 2014-12-04 DIAGNOSIS — D509 Iron deficiency anemia, unspecified: Secondary | ICD-10-CM | POA: Diagnosis not present

## 2014-12-04 DIAGNOSIS — Z23 Encounter for immunization: Secondary | ICD-10-CM | POA: Diagnosis not present

## 2014-12-05 DIAGNOSIS — N2581 Secondary hyperparathyroidism of renal origin: Secondary | ICD-10-CM | POA: Diagnosis not present

## 2014-12-05 DIAGNOSIS — E559 Vitamin D deficiency, unspecified: Secondary | ICD-10-CM | POA: Diagnosis not present

## 2014-12-05 DIAGNOSIS — N186 End stage renal disease: Secondary | ICD-10-CM | POA: Diagnosis not present

## 2014-12-05 DIAGNOSIS — D631 Anemia in chronic kidney disease: Secondary | ICD-10-CM | POA: Diagnosis not present

## 2014-12-05 DIAGNOSIS — Z992 Dependence on renal dialysis: Secondary | ICD-10-CM | POA: Diagnosis not present

## 2014-12-05 DIAGNOSIS — D509 Iron deficiency anemia, unspecified: Secondary | ICD-10-CM | POA: Diagnosis not present

## 2014-12-31 DIAGNOSIS — R42 Dizziness and giddiness: Secondary | ICD-10-CM | POA: Diagnosis not present

## 2014-12-31 DIAGNOSIS — E041 Nontoxic single thyroid nodule: Secondary | ICD-10-CM | POA: Diagnosis not present

## 2015-01-05 DIAGNOSIS — Z992 Dependence on renal dialysis: Secondary | ICD-10-CM | POA: Diagnosis not present

## 2015-01-05 DIAGNOSIS — N186 End stage renal disease: Secondary | ICD-10-CM | POA: Diagnosis not present

## 2015-01-06 DIAGNOSIS — N186 End stage renal disease: Secondary | ICD-10-CM | POA: Diagnosis not present

## 2015-01-06 DIAGNOSIS — Z992 Dependence on renal dialysis: Secondary | ICD-10-CM | POA: Diagnosis not present

## 2015-01-07 DIAGNOSIS — Z992 Dependence on renal dialysis: Secondary | ICD-10-CM | POA: Diagnosis not present

## 2015-01-07 DIAGNOSIS — N186 End stage renal disease: Secondary | ICD-10-CM | POA: Diagnosis not present

## 2015-01-08 DIAGNOSIS — N186 End stage renal disease: Secondary | ICD-10-CM | POA: Diagnosis not present

## 2015-01-08 DIAGNOSIS — Z992 Dependence on renal dialysis: Secondary | ICD-10-CM | POA: Diagnosis not present

## 2015-01-09 ENCOUNTER — Inpatient Hospital Stay
Admission: EM | Admit: 2015-01-09 | Discharge: 2015-01-14 | DRG: 469 | Disposition: A | Discharge status: Skilled Nursing Facility | Payer: Medicare Other | Attending: Internal Medicine | Admitting: Internal Medicine

## 2015-01-09 ENCOUNTER — Emergency Department: Payer: Medicare Other

## 2015-01-09 DIAGNOSIS — E042 Nontoxic multinodular goiter: Secondary | ICD-10-CM | POA: Diagnosis present

## 2015-01-09 DIAGNOSIS — E785 Hyperlipidemia, unspecified: Secondary | ICD-10-CM | POA: Diagnosis present

## 2015-01-09 DIAGNOSIS — W01198A Fall on same level from slipping, tripping and stumbling with subsequent striking against other object, initial encounter: Secondary | ICD-10-CM | POA: Diagnosis present

## 2015-01-09 DIAGNOSIS — J449 Chronic obstructive pulmonary disease, unspecified: Secondary | ICD-10-CM | POA: Diagnosis present

## 2015-01-09 DIAGNOSIS — Z7951 Long term (current) use of inhaled steroids: Secondary | ICD-10-CM | POA: Diagnosis not present

## 2015-01-09 DIAGNOSIS — Z881 Allergy status to other antibiotic agents status: Secondary | ICD-10-CM

## 2015-01-09 DIAGNOSIS — F17210 Nicotine dependence, cigarettes, uncomplicated: Secondary | ICD-10-CM | POA: Diagnosis present

## 2015-01-09 DIAGNOSIS — Z888 Allergy status to other drugs, medicaments and biological substances status: Secondary | ICD-10-CM | POA: Diagnosis not present

## 2015-01-09 DIAGNOSIS — Z9181 History of falling: Secondary | ICD-10-CM | POA: Diagnosis not present

## 2015-01-09 DIAGNOSIS — E349 Endocrine disorder, unspecified: Secondary | ICD-10-CM | POA: Diagnosis not present

## 2015-01-09 DIAGNOSIS — I959 Hypotension, unspecified: Secondary | ICD-10-CM | POA: Diagnosis present

## 2015-01-09 DIAGNOSIS — Z91030 Bee allergy status: Secondary | ICD-10-CM | POA: Diagnosis not present

## 2015-01-09 DIAGNOSIS — N186 End stage renal disease: Secondary | ICD-10-CM | POA: Diagnosis present

## 2015-01-09 DIAGNOSIS — Z853 Personal history of malignant neoplasm of breast: Secondary | ICD-10-CM

## 2015-01-09 DIAGNOSIS — I10 Essential (primary) hypertension: Secondary | ICD-10-CM | POA: Diagnosis not present

## 2015-01-09 DIAGNOSIS — S72032A Displaced midcervical fracture of left femur, initial encounter for closed fracture: Secondary | ICD-10-CM | POA: Diagnosis present

## 2015-01-09 DIAGNOSIS — E871 Hypo-osmolality and hyponatremia: Secondary | ICD-10-CM | POA: Diagnosis present

## 2015-01-09 DIAGNOSIS — N2581 Secondary hyperparathyroidism of renal origin: Secondary | ICD-10-CM | POA: Diagnosis present

## 2015-01-09 DIAGNOSIS — M1990 Unspecified osteoarthritis, unspecified site: Secondary | ICD-10-CM | POA: Diagnosis present

## 2015-01-09 DIAGNOSIS — Z8249 Family history of ischemic heart disease and other diseases of the circulatory system: Secondary | ICD-10-CM

## 2015-01-09 DIAGNOSIS — D631 Anemia in chronic kidney disease: Secondary | ICD-10-CM | POA: Diagnosis present

## 2015-01-09 DIAGNOSIS — Z882 Allergy status to sulfonamides status: Secondary | ICD-10-CM

## 2015-01-09 DIAGNOSIS — Z01818 Encounter for other preprocedural examination: Secondary | ICD-10-CM | POA: Diagnosis not present

## 2015-01-09 DIAGNOSIS — F411 Generalized anxiety disorder: Secondary | ICD-10-CM | POA: Diagnosis present

## 2015-01-09 DIAGNOSIS — K219 Gastro-esophageal reflux disease without esophagitis: Secondary | ICD-10-CM | POA: Diagnosis present

## 2015-01-09 DIAGNOSIS — Z992 Dependence on renal dialysis: Secondary | ICD-10-CM | POA: Diagnosis not present

## 2015-01-09 DIAGNOSIS — Z7982 Long term (current) use of aspirin: Secondary | ICD-10-CM

## 2015-01-09 DIAGNOSIS — M25559 Pain in unspecified hip: Secondary | ICD-10-CM | POA: Diagnosis not present

## 2015-01-09 DIAGNOSIS — I120 Hypertensive chronic kidney disease with stage 5 chronic kidney disease or end stage renal disease: Secondary | ICD-10-CM | POA: Diagnosis present

## 2015-01-09 DIAGNOSIS — Z79899 Other long term (current) drug therapy: Secondary | ICD-10-CM | POA: Diagnosis not present

## 2015-01-09 DIAGNOSIS — R410 Disorientation, unspecified: Secondary | ICD-10-CM | POA: Diagnosis not present

## 2015-01-09 DIAGNOSIS — F419 Anxiety disorder, unspecified: Secondary | ICD-10-CM | POA: Diagnosis not present

## 2015-01-09 DIAGNOSIS — Z87442 Personal history of urinary calculi: Secondary | ICD-10-CM | POA: Diagnosis not present

## 2015-01-09 DIAGNOSIS — Y9222 Religious institution as the place of occurrence of the external cause: Secondary | ICD-10-CM

## 2015-01-09 DIAGNOSIS — R296 Repeated falls: Secondary | ICD-10-CM | POA: Diagnosis present

## 2015-01-09 DIAGNOSIS — M8580 Other specified disorders of bone density and structure, unspecified site: Secondary | ICD-10-CM | POA: Diagnosis present

## 2015-01-09 DIAGNOSIS — W19XXXA Unspecified fall, initial encounter: Secondary | ICD-10-CM | POA: Diagnosis not present

## 2015-01-09 DIAGNOSIS — Z96642 Presence of left artificial hip joint: Secondary | ICD-10-CM | POA: Diagnosis not present

## 2015-01-09 DIAGNOSIS — S0083XA Contusion of other part of head, initial encounter: Secondary | ICD-10-CM | POA: Diagnosis not present

## 2015-01-09 DIAGNOSIS — R339 Retention of urine, unspecified: Secondary | ICD-10-CM | POA: Diagnosis present

## 2015-01-09 DIAGNOSIS — Z720 Tobacco use: Secondary | ICD-10-CM | POA: Diagnosis not present

## 2015-01-09 DIAGNOSIS — S72009A Fracture of unspecified part of neck of unspecified femur, initial encounter for closed fracture: Secondary | ICD-10-CM

## 2015-01-09 DIAGNOSIS — Z471 Aftercare following joint replacement surgery: Secondary | ICD-10-CM | POA: Diagnosis not present

## 2015-01-09 DIAGNOSIS — S7292XA Unspecified fracture of left femur, initial encounter for closed fracture: Secondary | ICD-10-CM | POA: Diagnosis not present

## 2015-01-09 DIAGNOSIS — S72002A Fracture of unspecified part of neck of left femur, initial encounter for closed fracture: Secondary | ICD-10-CM | POA: Diagnosis not present

## 2015-01-09 DIAGNOSIS — F17200 Nicotine dependence, unspecified, uncomplicated: Secondary | ICD-10-CM | POA: Diagnosis not present

## 2015-01-09 MED ORDER — VITAMIN D 1000 UNITS PO TABS
1000.0000 [IU] | ORAL_TABLET | Freq: Every day | ORAL | Status: DC
  Administered 2015-01-09 – 2015-01-14 (×4): 1000 [IU] via ORAL
  Filled 2015-01-09 (×8): qty 1

## 2015-01-09 MED ORDER — MORPHINE SULFATE (PF) 4 MG/ML IV SOLN
4.0000 mg | INTRAVENOUS | Status: DC | PRN
  Administered 2015-01-09 – 2015-01-10 (×4): 4 mg via INTRAVENOUS
  Filled 2015-01-09 (×4): qty 1

## 2015-01-09 MED ORDER — NICOTINE 14 MG/24HR TD PT24
14.0000 mg | MEDICATED_PATCH | Freq: Every day | TRANSDERMAL | Status: DC
  Administered 2015-01-09 – 2015-01-14 (×5): 14 mg via TRANSDERMAL
  Filled 2015-01-09 (×5): qty 1

## 2015-01-09 MED ORDER — MORPHINE SULFATE (PF) 4 MG/ML IV SOLN
4.0000 mg | Freq: Once | INTRAVENOUS | Status: AC
  Administered 2015-01-09: 4 mg via INTRAVENOUS

## 2015-01-09 MED ORDER — ACETAMINOPHEN 325 MG PO TABS
650.0000 mg | ORAL_TABLET | Freq: Four times a day (QID) | ORAL | Status: DC | PRN
  Administered 2015-01-11 – 2015-01-14 (×4): 650 mg via ORAL
  Filled 2015-01-09 (×4): qty 2

## 2015-01-09 MED ORDER — POTASSIUM CHLORIDE 20 MEQ PO PACK
20.0000 meq | PACK | Freq: Once | ORAL | Status: DC
  Filled 2015-01-09: qty 1

## 2015-01-09 MED ORDER — MORPHINE SULFATE (PF) 2 MG/ML IV SOLN
2.0000 mg | INTRAVENOUS | Status: DC | PRN
  Administered 2015-01-10 – 2015-01-11 (×2): 2 mg via INTRAVENOUS
  Filled 2015-01-09 (×2): qty 1

## 2015-01-09 MED ORDER — LORATADINE 10 MG PO TABS
10.0000 mg | ORAL_TABLET | Freq: Every day | ORAL | Status: DC
  Administered 2015-01-11 – 2015-01-14 (×3): 10 mg via ORAL
  Filled 2015-01-09 (×3): qty 1

## 2015-01-09 MED ORDER — MORPHINE SULFATE (PF) 4 MG/ML IV SOLN
INTRAVENOUS | Status: AC
  Administered 2015-01-09: 4 mg
  Filled 2015-01-09: qty 1

## 2015-01-09 MED ORDER — IRBESARTAN 75 MG PO TABS
37.5000 mg | ORAL_TABLET | Freq: Every day | ORAL | Status: DC
  Administered 2015-01-09 – 2015-01-12 (×3): 37.5 mg via ORAL
  Filled 2015-01-09 (×2): qty 2
  Filled 2015-01-09: qty 1

## 2015-01-09 MED ORDER — MORPHINE SULFATE (PF) 4 MG/ML IV SOLN
INTRAVENOUS | Status: AC
  Filled 2015-01-09: qty 1

## 2015-01-09 MED ORDER — TIOTROPIUM BROMIDE MONOHYDRATE 18 MCG IN CAPS
18.0000 ug | ORAL_CAPSULE | Freq: Every day | RESPIRATORY_TRACT | Status: DC
  Administered 2015-01-10 – 2015-01-13 (×4): 18 ug via RESPIRATORY_TRACT
  Filled 2015-01-09: qty 5

## 2015-01-09 MED ORDER — ONDANSETRON HCL 4 MG/2ML IJ SOLN
4.0000 mg | Freq: Once | INTRAMUSCULAR | Status: AC
  Administered 2015-01-09: 4 mg via INTRAVENOUS

## 2015-01-09 MED ORDER — MIRTAZAPINE 15 MG PO TABS
15.0000 mg | ORAL_TABLET | Freq: Every day | ORAL | Status: DC
  Administered 2015-01-10 – 2015-01-12 (×3): 15 mg via ORAL
  Filled 2015-01-09 (×3): qty 1

## 2015-01-09 MED ORDER — MORPHINE SULFATE (PF) 4 MG/ML IV SOLN
INTRAVENOUS | Status: AC
  Administered 2015-01-09: 4 mg via INTRAVENOUS
  Filled 2015-01-09: qty 1

## 2015-01-09 MED ORDER — PANTOPRAZOLE SODIUM 40 MG PO TBEC
40.0000 mg | DELAYED_RELEASE_TABLET | Freq: Every day | ORAL | Status: DC
  Administered 2015-01-11 – 2015-01-14 (×3): 40 mg via ORAL
  Filled 2015-01-09 (×3): qty 1

## 2015-01-09 MED ORDER — MOMETASONE FURO-FORMOTEROL FUM 100-5 MCG/ACT IN AERO: 2.0000 | INHALATION_SPRAY | Freq: Two times a day (BID) | RESPIRATORY_TRACT | Status: DC

## 2015-01-09 MED ORDER — LACTULOSE 10 GM/15ML PO SOLN
20.0000 g | Freq: Two times a day (BID) | ORAL | Status: DC | PRN
  Administered 2015-01-13 – 2015-01-14 (×2): 20 g via ORAL
  Filled 2015-01-09 (×2): qty 30

## 2015-01-09 MED ORDER — SODIUM CHLORIDE 0.9 % IV SOLN
250.0000 mL | INTRAVENOUS | Status: DC | PRN
  Administered 2015-01-10 (×2): via INTRAVENOUS

## 2015-01-09 MED ORDER — ASPIRIN EC 81 MG PO TBEC
81.0000 mg | DELAYED_RELEASE_TABLET | Freq: Every day | ORAL | Status: DC
  Administered 2015-01-11 – 2015-01-14 (×2): 81 mg via ORAL
  Filled 2015-01-09 (×3): qty 1

## 2015-01-09 MED ORDER — ONDANSETRON HCL 4 MG/2ML IJ SOLN
INTRAMUSCULAR | Status: AC
  Filled 2015-01-09: qty 2

## 2015-01-09 MED ORDER — ACETAMINOPHEN 650 MG RE SUPP: 650.0000 mg | Freq: Four times a day (QID) | RECTAL | Status: DC | PRN

## 2015-01-09 MED ORDER — FUROSEMIDE 40 MG PO TABS
40.0000 mg | ORAL_TABLET | Freq: Every day | ORAL | Status: DC
  Administered 2015-01-11: 40 mg via ORAL
  Filled 2015-01-09 (×2): qty 1

## 2015-01-09 MED ORDER — LORAZEPAM 1 MG PO TABS
1.0000 mg | ORAL_TABLET | Freq: Every evening | ORAL | Status: DC | PRN
  Administered 2015-01-09 – 2015-01-13 (×2): 1 mg via ORAL
  Filled 2015-01-09 (×2): qty 1

## 2015-01-09 MED ORDER — SODIUM CHLORIDE 0.9 % IJ SOLN
3.0000 mL | Freq: Two times a day (BID) | INTRAMUSCULAR | Status: DC
  Administered 2015-01-10 – 2015-01-14 (×5): 3 mL via INTRAVENOUS

## 2015-01-09 MED ORDER — MOMETASONE FURO-FORMOTEROL FUM 100-5 MCG/ACT IN AERO
2.0000 | INHALATION_SPRAY | Freq: Two times a day (BID) | RESPIRATORY_TRACT | Status: DC
  Administered 2015-01-10 – 2015-01-14 (×7): 2 via RESPIRATORY_TRACT
  Filled 2015-01-09: qty 8.8

## 2015-01-09 MED ORDER — ALBUTEROL SULFATE HFA 108 (90 BASE) MCG/ACT IN AERS: 2.0000 | INHALATION_SPRAY | Freq: Four times a day (QID) | RESPIRATORY_TRACT | Status: DC | PRN

## 2015-01-09 MED ORDER — BUDESONIDE-FORMOTEROL FUMARATE 160-4.5 MCG/ACT IN AERO
1.0000 | INHALATION_SPRAY | Freq: Two times a day (BID) | RESPIRATORY_TRACT | Status: DC
  Filled 2015-01-09: qty 6

## 2015-01-09 MED ORDER — ROSUVASTATIN CALCIUM 10 MG PO TABS
10.0000 mg | ORAL_TABLET | Freq: Every evening | ORAL | Status: DC
  Administered 2015-01-09 – 2015-01-14 (×6): 10 mg via ORAL
  Filled 2015-01-09 (×6): qty 1

## 2015-01-09 MED ORDER — SODIUM CHLORIDE 0.9 % IJ SOLN: 3.0000 mL | INTRAMUSCULAR | Status: DC | PRN

## 2015-01-09 MED ORDER — ALBUTEROL SULFATE (2.5 MG/3ML) 0.083% IN NEBU: 2.5000 mg | INHALATION_SOLUTION | Freq: Four times a day (QID) | RESPIRATORY_TRACT | Status: DC | PRN

## 2015-01-09 NOTE — ED Provider Notes (Addendum)
[facility] Emergency Department Provider Note  ____________________________________________  Time seen: Approximately 5:58 PM  I have reviewed the triage vital signs and the nursing notes.   HISTORY  Chief Complaint Fall    HPI Lydia Clark is a 74 y.o. female patient reports she was at church try to get out of the church using her walker she pushed open a heavy door and the door came back and hit her doctor over she hit her head but did not pass out she complains of some pain in her chest and a lot of pain in her left hip. This happened earlier today pain is severe in the hip mild and chest does not have a headache no vomiting. She has peritoneal dialysis ongoing patient cannot bear weight on her hip and hip pain is made worse with movement  s)**} Past Medical History  Diagnosis Date  . Allergy   . Cancer [facility]) 2008    left breast  . Hypertension 2006  . Personal history of tobacco use, presenting hazards to health 2012    30 yrs smoking  . Endocrine disorder 2008    thyroid  . Personal history of malignant neoplasm of breast 2008  . Breast screening, unspecified 2013  . Special screening for malignant neoplasms, colon 2013  . COPD (chronic obstructive pulmonary disease) ([place])   . PE (pulmonary embolism)     patient denies  . Swelling of throat     swelling at base of throat,  . Anxiety     anxiety  . Pneumonia     hx  . Heart murmur     child  . History of kidney stones   . GERD (gastroesophageal reflux disease)   . Arthritis   . Multinodular goiter     followed by Dr. Carloyn Manner @ [facility]  . Dialysis patient ([place]) 2014  . Chronic kidney disease 2014    stage IV chronic     Patient Active Problem List   Diagnosis Date Noted  . Cough 04/01/2014  . COPD, mild ([place]) 04/01/2014  . Tobacco use disorder 04/01/2014  . 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 Breast surgery Left 2008    lumpectomy  . Fooy Left     lft foot  . Foot surgery  2005  . 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 Tonsillectomy  2005  . Breast biopsy  2005  . Appendectomy  2005  . Replacement total knee Left 2012    Partial   . Colonoscopy  2007    done in [place]  . Insertion of dialysis catheter Right 07/02/2012    Procedure: INSERTION OF DIALYSIS CATHETER;  Surgeon: Elam Dutch, MD;  Location: [facility];  Service: Vascular;  Laterality: Right;  Right Internal Jugular  . Insertion of dialysis catheter Right July 02 2012    right chest/ temporary cath    Current Outpatient Rx  Name  Route  Sig  Dispense  Refill  . ADVAIR DISKUS 250-50 MCG/DOSE AEPB   Inhalation   Inhale 2 puffs into the lungs 2 (two) times daily.      11     Dispense as written.   Marland Kitchen albuterol (PROVENTIL HFA;VENTOLIN HFA) 108 (90 BASE) MCG/ACT inhaler   Inhalation   Inhale 2 puffs into the lungs every 6 (six)  hours as needed for wheezing or shortness of breath.         Marland Kitchen aspirin 81 MG tablet   Oral   Take 81 mg by mouth daily.         . budesonide-formoterol (SYMBICORT) 160-4.5 MCG/ACT inhaler   Inhalation   Inhale 1 puff into the lungs 2 (two) times daily.   1 Inhaler   6   . Cholecalciferol (VITAMIN D3) 2000 UNITS TABS   Oral   Take 1 tablet by mouth daily.         . fexofenadine (ALLEGRA) 60 MG tablet   Oral   Take 60 mg by mouth 2 (two) times daily.       3   . gentamicin ointment (GARAMYCIN) 0.1 %   Topical   Apply 1 application topically at bedtime.       10   . LORazepam (ATIVAN) 1 MG tablet   Oral   Take 1 mg by mouth at bedtime as needed for anxiety.          Marland Kitchen omeprazole (PRILOSEC) 20 MG capsule   Oral   Take 20 mg by mouth daily.         . rosuvastatin (CRESTOR) 10 MG tablet   Oral   Take 10 mg by mouth daily.         Marland Kitchen tiotropium (SPIRIVA) 18 MCG inhalation capsule   Inhalation    Place 18 mcg into inhaler and inhale at bedtime.            Allergies Bee venom; Biaxin; Mycostatin; Polysorbate; and Sulfa antibiotics  Family History  Problem Relation Age of Onset  . Cancer Other     breast cancer  . Heart attack Father   . Pneumonia Father     Social History Social History  Substance Use Topics  . Smoking status: Current Every Day Smoker -- 0.30 packs/day for 30 years    Types: Cigarettes  . Smokeless tobacco: Never Used  . Alcohol Use: No    Review of Systems Constitutional: No fever/chills Eyes: No visual changes. ENT: No sore throat. Cardiovascular: Denies chest pain. Respiratory: Denies shortness of breath. Gastrointestinal: No abdominal pain.  No nausea, no vomiting.  No diarrhea.  No constipation. Genitourinary: Negative for dysuria. Musculoskeletal: Negative for back pain. Skin: Negative for rash. Neurological: Negative for headaches, focal weakness or numbness.  10-point ROS otherwise negative.  ____________________________________________   PHYSICAL EXAM:  VITAL SIGNS: ED Triage Vitals  Enc Vitals Group     BP 01/09/15 1651 144/88 mmHg     Pulse Rate 01/09/15 1651 76     Resp 01/09/15 1651 14     Temp 01/09/15 1651 97.9 F (36.6 C)     Temp Source 01/09/15 1651 Oral     SpO2 01/09/15 1648 94 %     Weight 01/09/15 1651 122 lb (55.339 kg)     Height 01/09/15 1651 5\' 7"  (1.702 m)     Head Cir --      Peak Flow --      Pain Score 01/09/15 1652 10     Pain Loc --      Pain Edu? --      Excl. in [place]? --     Constitutional: Alert and oriented. Well appearing and in no acute distress. Eyes: Conjunctivae are normal. PERRL. EOMI. Head: Atraumatic. Nose: No congestion/rhinnorhea. Mouth/Throat: Mucous membranes are moist.  Oropharynx non-erythematous. Neck: No stridor. No cervical spine tenderness to palpation. Cardiovascular: Normal  rate, regular rhythm. Grossly normal heart sounds.  Good peripheral circulation. Respiratory:  Normal respiratory effort.  No retractions. Lungs CTAB. Chest wall is mildly tender diffusely is no focal tenderness Gastrointestinal: Soft and nontender. No distention. No abdominal bruits. No CVA tenderness. Musculoskeletal: Patient complains of a lot of pain in the left hip No joint effusions. Neurologic:  Normal speech and language. No gross focal neurologic deficits are appreciated. No gait instability. Skin:  Skin is warm, dry and intact. No rash noted. Psychiatric: Mood and affect are normal. Speech and behavior are normal.  ____________________________________________   LABS (all labs ordered are listed, but only abnormal results are displayed)  Labs Reviewed - No data to display ____________________________________________  EKG   ____________________________________________  RADIOLOGY Radiology reads hip fracture on the left. ____________________________________________   PROCEDURES    ____________________________________________   INITIAL IMPRESSION / ASSESSMENT AND PLAN / ED COURSE  Pertinent labs & imaging results that were available during my care of the patient were reviewed by me and considered in my medical decision making (see chart for details).   ____________________________________________   FINAL CLINICAL IMPRESSION(S) / ED DIAGNOSES  Final diagnoses:  Closed left hip fracture, initial encounter ([place])      Nena Polio, MD 01/09/15 1802  EKG read and interpreted by me shows normal sinus rhythm a rate of 80 left axis bundle-branch block no acute ST-T wave changes  Nena Polio, MD 01/09/15 1845  I got Dr. Candiss Norse on the phone he says they will do the peritoneal dialysis in the morning since the nurse gets  Nena Polio, MD 01/09/15 [phone]

## 2015-01-09 NOTE — ED Notes (Signed)
Pt bib EMS w/ c/o fall.  Per EMS, pt was exiting church and fell.  Pt sts that she lost balance.  Per EMS, pt has had several falls recently.  Pt sts that pain is in L hip and side.  Pt sts that it is probable she hit head.  Per EMS, no shortening or rotation.

## 2015-01-09 NOTE — H&P (Signed)
[facility] [place] NAME: Lydia Clark    MR#:  [id number]  DATE OF BIRTH:  [birth year]  DATE OF ADMISSION:  01/09/2015  PRIMARY CARE PHYSICIAN: Alonza Bogus, MD   REQUESTING/REFERRING PHYSICIAN: Malinda  CHIEF COMPLAINT:  Fall and left hip pain  HISTORY OF PRESENT ILLNESS:  Lydia Clark  is a 74 y.o. female with a known history of hypertension, end-stage renal disease on peritoneal dialysis, chronic COPD and continues to smoke is presenting to the ED after she sustained a fall. X-ray has revealed left femoral neck fracture and CT head is negative for any acute abnormalities. ED physician has discussed with on-call orthopedic's surgeon , who has scheduled surgery in a.m. Patient is reporting left hip pain during my examination. Still continues to smoke. Reporting frequent falls  PAST MEDICAL HISTORY:   Past Medical History  Diagnosis Date  . Allergy   . Cancer [facility]) 2008    left breast  . Hypertension 2006  . Personal history of tobacco use, presenting hazards to health 2012    30 yrs smoking  . Endocrine disorder 2008    thyroid  . Personal history of malignant neoplasm of breast 2008  . Breast screening, unspecified 2013  . Special screening for malignant neoplasms, colon 2013  . COPD (chronic obstructive pulmonary disease) ([place])   . PE (pulmonary embolism)     patient denies  . Swelling of throat     swelling at base of throat,  . Anxiety     anxiety  . Pneumonia     hx  . Heart murmur     child  . History of kidney stones   . GERD (gastroesophageal reflux disease)   . Arthritis   . Multinodular goiter     followed by Dr. Carloyn Manner @ [facility]  . Dialysis patient ([facility]) 2014  . Chronic kidney disease 2014    stage IV chronic     PAST SURGICAL HISTOIRY:   Past Surgical History  Procedure Laterality Date  . Breast surgery Left 2008    lumpectomy  . Fooy Left     lft foot  . Foot surgery  2005   . 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 Tonsillectomy  2005  . Breast biopsy  2005  . Appendectomy  2005  . Replacement total knee Left 2012    Partial   . Colonoscopy  2007    done in [place]  . Insertion of dialysis catheter Right 07/02/2012    Procedure: INSERTION OF DIALYSIS CATHETER;  Surgeon: Elam Dutch, MD;  Location: [facility];  Service: Vascular;  Laterality: Right;  Right Internal Jugular  . Insertion of dialysis catheter Right July 02 2012    right chest/ temporary cath    SOCIAL HISTORY:   Social History  Substance Use Topics  . Smoking status: Current Every Day Smoker -- 0.30 packs/day for 30 years    Types: Cigarettes  . Smokeless tobacco: Never Used  . Alcohol Use: No    FAMILY HISTORY:   Family History  Problem Relation Age of Onset  . Cancer Other     breast cancer  . Heart attack Father   . Pneumonia Father     DRUG ALLERGIES:   Allergies  Allergen Reactions  . Bee Venom Swelling and Other (See Comments)    Breathing problems  . Biaxin [Clarithromycin] Other (See Comments)  Throat swells  . Mycostatin [Nystatin] Other (See Comments)    Blisters from the ointment  . Polysorbate     Other reaction(s): Unknown  . Sulfa Antibiotics Other (See Comments)    "welps"    REVIEW OF SYSTEMS:  CONSTITUTIONAL: No fever, fatigue or weakness.  EYES: No blurred or double vision.  EARS, NOSE, AND THROAT: No tinnitus or ear pain.  RESPIRATORY: No cough, shortness of breath, wheezing or hemoptysis.  CARDIOVASCULAR: No chest pain, orthopnea, edema.  GASTROINTESTINAL: No nausea, vomiting, diarrhea or abdominal pain.  GENITOURINARY: No dysuria, hematuria.  ENDOCRINE: No polyuria, nocturia,  HEMATOLOGY: No anemia, easy bruising or bleeding SKIN: No rash or lesion. MUSCULOSKELETAL: Reporting left hip pain   NEUROLOGIC: No tingling, numbness, weakness.  PSYCHIATRY: No anxiety or depression.   MEDICATIONS AT HOME:   Prior to  Admission medications   Medication Sig Start Date End Date Taking? Authorizing Provider  ADVAIR DISKUS 250-50 MCG/DOSE AEPB Inhale 2 puffs into the lungs 2 (two) times daily. 06/30/14  Yes Historical Provider, MD  albuterol (PROVENTIL HFA;VENTOLIN HFA) 108 (90 BASE) MCG/ACT inhaler Inhale 2 puffs into the lungs every 6 (six) hours as needed for wheezing or shortness of breath.   Yes Historical Provider, MD  aspirin 81 MG tablet Take 81 mg by mouth daily.   Yes Historical Provider, MD  budesonide-formoterol (SYMBICORT) 160-4.5 MCG/ACT inhaler Inhale 1 puff into the lungs 2 (two) times daily. 06/16/14  Yes Vishal Mungal, MD  Cholecalciferol (VITAMIN D3) 2000 UNITS TABS Take 1 tablet by mouth daily.   Yes Historical Provider, MD  fexofenadine (ALLEGRA) 60 MG tablet Take 60 mg by mouth 2 (two) times daily.  12/30/13  Yes Historical Provider, MD  Fluticasone-Salmeterol (ADVAIR DISKUS) 250-50 MCG/DOSE AEPB Inhale 1 puff into the lungs every 12 (twelve) hours. 08/07/14 08/07/15 Yes Historical Provider, MD  furosemide (LASIX) 40 MG tablet Take 40 mg by mouth daily. 12/31/14  Yes Historical Provider, MD  gentamicin ointment (GARAMYCIN) 0.1 % Apply 1 application topically at bedtime.  12/26/13  Yes Historical Provider, MD  LORazepam (ATIVAN) 1 MG tablet Take 1 mg by mouth at bedtime as needed for anxiety.    Yes Historical Provider, MD  mirtazapine (REMERON) 15 MG tablet Take 15 mg by mouth at bedtime. 12/31/14  Yes Historical Provider, MD  omeprazole (PRILOSEC) 20 MG capsule Take 20 mg by mouth daily.   Yes Historical Provider, MD  rosuvastatin (CRESTOR) 10 MG tablet Take 10 mg by mouth every evening.    Yes Historical Provider, MD  tiotropium (SPIRIVA) 18 MCG inhalation capsule Place 18 mcg into inhaler and inhale at bedtime.    Yes Historical Provider, MD  valsartan (DIOVAN) 160 MG tablet Take 160 mg by mouth daily. 01/01/15  Yes Historical Provider, MD      VITAL SIGNS:  Blood pressure 117/89, pulse 84,  temperature 97.9 F (36.6 C), temperature source Oral, resp. rate 17, height 5\' 7"  (1.702 m), weight 55.339 kg (122 lb), SpO2 93 %.  PHYSICAL EXAMINATION:  GENERAL:  74 y.o.-year-old patient lying in the bed with no acute distress.  EYES: Pupils equal, round, reactive to light and accommodation. No scleral icterus. Extraocular muscles intact.  HEENT: Head atraumatic, normocephalic. Oropharynx and nasopharynx clear.  NECK:  Supple, no jugular venous distention. No thyroid enlargement, no tenderness.  LUNGS: Normal breath sounds bilaterally, no wheezing, rales,rhonchi or crepitation. No use of accessory muscles of respiration.  CARDIOVASCULAR: S1, S2 normal. No murmurs, rubs, or gallops.  ABDOMEN: Soft, nontender,  nondistended. Bowel sounds present. No organomegaly or mass.  EXTREMITIES: Left hip area is abducted and externally rotated. Tender to touch. No pedal edema, cyanosis, or clubbing.  NEUROLOGIC: Cranial nerves II through XII are intact. Muscle strength 5/5 in all extremities except left lower extremity . Sensation intact. Gait not checked.  PSYCHIATRIC: The patient is alert and oriented x 3.  SKIN: No obvious rash, lesion, or ulcer.   LABORATORY PANEL:   CBC No results for input(s): WBC, HGB, HCT, PLT in the last 168 hours. ------------------------------------------------------------------------------------------------------------------  Chemistries  No results for input(s): NA, K, CL, CO2, GLUCOSE, BUN, CREATININE, CALCIUM, MG, AST, ALT, ALKPHOS, BILITOT in the last 168 hours.  Invalid input(s): GFRCGP ------------------------------------------------------------------------------------------------------------------  Cardiac Enzymes No results for input(s): TROPONINI in the last 168 hours. ------------------------------------------------------------------------------------------------------------------  RADIOLOGY:  Dg Chest 1 View  01/09/2015  CLINICAL DATA:  74 year old  female with history of left hip fracture. Preoperative study. EXAM: CHEST 1 VIEW COMPARISON:  Chest x-ray 04/07/2014. FINDINGS: Emphysematous changes throughout the lungs bilaterally. No acute consolidative airspace disease. No pleural effusions. No definite suspicious appearing pulmonary nodules or masses. No evidence of pulmonary edema. Heart size is upper limits of normal. Upper mediastinal contours are within normal limits. Atherosclerosis in the thoracic aorta. IMPRESSION: 1. No radiographic evidence of acute cardiopulmonary disease. 2. Emphysema. 3. Atherosclerosis. Electronically Signed   By: Vinnie Langton M.D.   On: 01/09/2015 18:00   Ct Head Wo Contrast  01/09/2015  CLINICAL DATA:  Multiple falls, bruise along the temporal area EXAM: CT HEAD WITHOUT CONTRAST TECHNIQUE: Contiguous axial images were obtained from the base of the skull through the vertex without intravenous contrast. COMPARISON:  None. FINDINGS: There is no evidence of mass effect, midline shift, or extra-axial fluid collections. There is no evidence of a space-occupying lesion or intracranial hemorrhage. There is no evidence of a cortical-based area of acute infarction. There is generalized cerebral atrophy. There is periventricular white matter low attenuation likely secondary to microangiopathy. The ventricles and sulci are appropriate for the patient's age. The basal cisterns are patent. Visualized portions of the orbits are unremarkable. The visualized portions of the paranasal sinuses and mastoid air cells are unremarkable. Cerebrovascular atherosclerotic calcifications are noted. The osseous structures are unremarkable. IMPRESSION: 1. No acute intracranial pathology. 2. Chronic microvascular disease and cerebral atrophy. Electronically Signed   By: Kathreen Devoid   On: 01/09/2015 18:53   Dg Hip Unilat With Pelvis 2-3 Views Left  01/09/2015  CLINICAL DATA:  74 year old female with history of trauma from a fall while leaking church  earlier today complaining of pain in the left hip. EXAM: DG HIP (WITH OR WITHOUT PELVIS) 2-3V LEFT COMPARISON:  No priors. FINDINGS: Three views of the bony pelvis and left hip demonstrate an acute transcervical left femoral neck fracture which appears mildly comminuted. There is approximately 1.6 cm of proximal migration of the femur relative to the proximal fragment. Femoral head remains located. Small adjacent bony fragment immediately superior and lateral to the femoral head also noted. Bony pelvis itself appears intact, as does the visualized portions of the right proximal femur. IMPRESSION: 1. Acute displaced minimally comminuted transcervical left femoral neck fracture, as above. Electronically Signed   By: Vinnie Langton M.D.   On: 01/09/2015 17:42    EKG:   Orders placed or performed in visit on 08/11/13  . EKG 12-Lead    IMPRESSION AND PLAN:   1. Acute left femoral neck fracture Patient will be kept nothing by mouth Continue pain  management Orthopedics consult is placed, the planning to do surgery in a.m. Patient is at moderate  risk for noncardiac surgery, we will medically clear her for surgery after peritoneal dialysis in a.m.  2. End-stage renal disease on peritoneal dialysis Patient cannot get her peritoneal dialysis tonight as she is with a left femoral neck fracture Nephrology is consulted and they will do peritoneal dialysis  in a.m.   3. Essential hypertension Continue patient's home medication Diovan and titrate as needed basis   4. Chronic history of COPD no exacerbation Continue inhalers as needed basis Albuterol neb treatments as needed for shortness of breath   5. Tobacco abuse disorder Counseled patient to quit smoking for 3-5 minutes. Patient has cut down from one pack to half pack a day. Provide nicotine patch   DVT prophylaxis with SCDs   All the records are reviewed and case discussed with ED provider. Management plans discussed with the patient,  family and they are in agreement.  CODE STATUS: Full code, son and daughter is the healthcare power of attorney  TOTAL TIME TAKING CARE OF THIS PATIENT: 45 minutes.  Greater than 50% time was spent in coordination of care and face-to-face counseling   Nicholes Mango M.D on 01/09/2015 at 8:00 PM  Between 7am to [place] - [phone]  After 6pm go to [email or web address] - password [email or web address] [facility]  [facility]  Office  [phone]  CC: Primary care physician; Alonza Bogus, MD

## 2015-01-09 NOTE — Consult Note (Signed)
ORTHOPAEDIC CONSULTATION  REQUESTING PHYSICIAN: Nena Polio, MD  Chief Complaint: Left hip pain  HPI: Lydia Clark is a 74 y.o. female who complains of  Left hip pain and inability to bear weight after a ground level fall. She denies any LOC.  Past Medical History  Diagnosis Date  . Allergy   . Cancer [facility]) 2008    left breast  . Hypertension 2006  . Personal history of tobacco use, presenting hazards to health 2012    30 yrs smoking  . Endocrine disorder 2008    thyroid  . Personal history of malignant neoplasm of breast 2008  . Breast screening, unspecified 2013  . Special screening for malignant neoplasms, colon 2013  . COPD (chronic obstructive pulmonary disease) ([place])   . PE (pulmonary embolism)     patient denies  . Swelling of throat     swelling at base of throat,  . Anxiety     anxiety  . Pneumonia     hx  . Heart murmur     child  . History of kidney stones   . GERD (gastroesophageal reflux disease)   . Arthritis   . Multinodular goiter     followed by Dr. Carloyn Manner @ [facility]  . Dialysis patient ([place]) 2014  . Chronic kidney disease 2014    stage IV chronic    Past Surgical History  Procedure Laterality Date  . Breast surgery Left 2008    lumpectomy  . Fooy Left     lft foot  . Foot surgery  2005  . 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 Tonsillectomy  2005  . Breast biopsy  2005  . Appendectomy  2005  . Replacement total knee Left 2012    Partial   . Colonoscopy  2007    done in [place]  . Insertion of dialysis catheter Right 07/02/2012    Procedure: INSERTION OF DIALYSIS CATHETER;  Surgeon: Elam Dutch, MD;  Location: [facility];  Service: Vascular;  Laterality: Right;  Right Internal Jugular  . Insertion of dialysis catheter Right July 02 2012    right chest/ temporary cath   Social History   Social History  . Marital Status: Widowed    Spouse Name: N/A  . Number of Children: N/A  . Years of  Education: N/A   Social History Main Topics  . Smoking status: Current Every Day Smoker -- 0.30 packs/day for 30 years    Types: Cigarettes  . Smokeless tobacco: Never Used  . Alcohol Use: No  . Drug Use: No  . Sexual Activity: Not Asked   Other Topics Concern  . None   Social History Narrative   Family History  Problem Relation Age of Onset  . Cancer Other     breast cancer  . Heart attack Father   . Pneumonia Father    Allergies  Allergen Reactions  . Bee Venom Swelling and Other (See Comments)    Breathing problems  . Biaxin [Clarithromycin] Other (See Comments)    Throat swells  . Mycostatin [Nystatin] Other (See Comments)    Blisters from the ointment  . Polysorbate     Other reaction(s): Unknown  . Sulfa Antibiotics Other (See Comments)    "welps"   Prior to Admission medications   Medication Sig Start Date End Date Taking? Authorizing Provider  ADVAIR DISKUS 250-50 MCG/DOSE AEPB Inhale 2 puffs into the lungs 2 (two) times daily.  06/30/14   Historical Provider, MD  albuterol (PROVENTIL HFA;VENTOLIN HFA) 108 (90 BASE) MCG/ACT inhaler Inhale 2 puffs into the lungs every 6 (six) hours as needed for wheezing or shortness of breath.    Historical Provider, MD  aspirin 81 MG tablet Take 81 mg by mouth daily.    Historical Provider, MD  budesonide-formoterol (SYMBICORT) 160-4.5 MCG/ACT inhaler Inhale 1 puff into the lungs 2 (two) times daily. 06/16/14   Vilinda Boehringer, MD  Cholecalciferol (VITAMIN D3) 2000 UNITS TABS Take 1 tablet by mouth daily.    Historical Provider, MD  fexofenadine (ALLEGRA) 60 MG tablet Take 60 mg by mouth 2 (two) times daily.  12/30/13   Historical Provider, MD  gentamicin ointment (GARAMYCIN) 0.1 % Apply 1 application topically at bedtime.  12/26/13   Historical Provider, MD  LORazepam (ATIVAN) 1 MG tablet Take 1 mg by mouth at bedtime as needed for anxiety.     Historical Provider, MD  omeprazole (PRILOSEC) 20 MG capsule Take 20 mg by mouth daily.     Historical Provider, MD  rosuvastatin (CRESTOR) 10 MG tablet Take 10 mg by mouth daily.    Historical Provider, MD  tiotropium (SPIRIVA) 18 MCG inhalation capsule Place 18 mcg into inhaler and inhale at bedtime.     Historical Provider, MD   Dg Chest 1 View  01/09/2015  CLINICAL DATA:  74 year old female with history of left hip fracture. Preoperative study. EXAM: CHEST 1 VIEW COMPARISON:  Chest x-ray 04/07/2014. FINDINGS: Emphysematous changes throughout the lungs bilaterally. No acute consolidative airspace disease. No pleural effusions. No definite suspicious appearing pulmonary nodules or masses. No evidence of pulmonary edema. Heart size is upper limits of normal. Upper mediastinal contours are within normal limits. Atherosclerosis in the thoracic aorta. IMPRESSION: 1. No radiographic evidence of acute cardiopulmonary disease. 2. Emphysema. 3. Atherosclerosis. Electronically Signed   By: Vinnie Langton M.D.   On: 01/09/2015 18:00   Dg Hip Unilat With Pelvis 2-3 Views Left  01/09/2015  CLINICAL DATA:  74 year old female with history of trauma from a fall while leaking church earlier today complaining of pain in the left hip. EXAM: DG HIP (WITH OR WITHOUT PELVIS) 2-3V LEFT COMPARISON:  No priors. FINDINGS: Three views of the bony pelvis and left hip demonstrate an acute transcervical left femoral neck fracture which appears mildly comminuted. There is approximately 1.6 cm of proximal migration of the femur relative to the proximal fragment. Femoral head remains located. Small adjacent bony fragment immediately superior and lateral to the femoral head also noted. Bony pelvis itself appears intact, as does the visualized portions of the right proximal femur. IMPRESSION: 1. Acute displaced minimally comminuted transcervical left femoral neck fracture, as above. Electronically Signed   By: Vinnie Langton M.D.   On: 01/09/2015 17:42    Positive ROS: All other systems have been reviewed and were  otherwise negative with the exception of those mentioned in the HPI and as above.  Physical Exam: General: Alert, no acute distress Cardiovascular: No pedal edema Respiratory: No cyanosis, no use of accessory musculature GI: No organomegaly, abdomen is soft and non-tender Skin: No lesions in the area of chief complaint Neurologic: Sensation intact distally Psychiatric: Patient is competent for consent with normal mood and affect Lymphatic: No axillary or cervical lymphadenopathy  MUSCULOSKELETAL: Shortened, externally rotated LLE. Palpable DP/PT pulses and intact sensation throughout the foot.  Assessment: Left displaced femoral neck fracture  Plan: Admit to medicine for medical optimization/risk stratification. Will plan for Left hip hemiarthropalsty in  AM if patient is cleared for surgery       01/09/2015 6:36 PM

## 2015-01-10 ENCOUNTER — Inpatient Hospital Stay: Payer: Medicare Other | Admitting: Anesthesiology

## 2015-01-10 ENCOUNTER — Encounter: Payer: Self-pay | Admitting: Anesthesiology

## 2015-01-10 ENCOUNTER — Inpatient Hospital Stay: Payer: Medicare Other

## 2015-01-10 ENCOUNTER — Encounter
Admission: EM | Disposition: A | Discharge status: Skilled Nursing Facility | Payer: Self-pay | Source: Home / Self Care | Attending: Internal Medicine

## 2015-01-10 HISTORY — PX: HIP ARTHROPLASTY: SHX981

## 2015-01-10 LAB — PROTIME-INR
INR: 0.85
PROTHROMBIN TIME: 11.8 s (ref 11.4–15.0)

## 2015-01-10 LAB — CBC
HEMATOCRIT: 38.7 % (ref 35.0–47.0)
Hemoglobin: 13 g/dL (ref 12.0–16.0)
MCH: 31.8 pg (ref 26.0–34.0)
MCHC: 33.5 g/dL (ref 32.0–36.0)
MCV: 95 fL (ref 80.0–100.0)
PLATELETS: 228 10*3/uL (ref 150–440)
RBC: 4.08 MIL/uL (ref 3.80–5.20)
RDW: 15.8 % — AB (ref 11.5–14.5)
WBC: 8.5 10*3/uL (ref 3.6–11.0)

## 2015-01-10 LAB — COMPREHENSIVE METABOLIC PANEL
ALBUMIN: 2.9 g/dL — AB (ref 3.5–5.0)
ALK PHOS: 71 U/L (ref 38–126)
ALT: 17 U/L (ref 14–54)
AST: 30 U/L (ref 15–41)
Anion gap: 10 (ref 5–15)
BILIRUBIN TOTAL: 0.2 mg/dL — AB (ref 0.3–1.2)
BUN: 49 mg/dL — AB (ref 6–20)
CALCIUM: 8.9 mg/dL (ref 8.9–10.3)
CO2: 29 mmol/L (ref 22–32)
CREATININE: 6.81 mg/dL — AB (ref 0.44–1.00)
Chloride: 96 mmol/L — ABNORMAL LOW (ref 101–111)
GFR calc Af Amer: 6 mL/min — ABNORMAL LOW (ref >60)
GFR, EST NON AFRICAN AMERICAN: 5 mL/min — AB (ref >60)
GLUCOSE: 92 mg/dL (ref 65–99)
Potassium: 3.9 mmol/L (ref 3.5–5.1)
Sodium: 135 mmol/L (ref 135–145)
TOTAL PROTEIN: 6.5 g/dL (ref 6.5–8.1)

## 2015-01-10 SURGERY — HEMIARTHROPLASTY, HIP, DIRECT ANTERIOR APPROACH, FOR FRACTURE
Anesthesia: Monitor Anesthesia Care | Laterality: Left

## 2015-01-10 MED ORDER — ONDANSETRON HCL 4 MG/2ML IJ SOLN: 4.0000 mg | Freq: Four times a day (QID) | INTRAMUSCULAR | Status: DC | PRN

## 2015-01-10 MED ORDER — VANCOMYCIN HCL 1000 MG IV SOLR
INTRAVENOUS | Status: AC
  Filled 2015-01-10: qty 1000

## 2015-01-10 MED ORDER — CEFAZOLIN SODIUM 1-5 GM-% IV SOLN
INTRAVENOUS | Status: DC | PRN
  Administered 2015-01-10: 2 g via INTRAVENOUS

## 2015-01-10 MED ORDER — FENTANYL CITRATE (PF) 100 MCG/2ML IJ SOLN
INTRAMUSCULAR | Status: DC | PRN
  Administered 2015-01-10 (×2): 50 ug via INTRAVENOUS

## 2015-01-10 MED ORDER — HEPARIN 1000 UNIT/ML FOR PERITONEAL DIALYSIS: 3000.0000 [IU] | INTRAMUSCULAR | Status: DC | PRN

## 2015-01-10 MED ORDER — GENTAMICIN SULFATE 0.1 % EX OINT
TOPICAL_OINTMENT | Freq: Every day | CUTANEOUS | Status: DC
  Administered 2015-01-12: 16:00:00 via TOPICAL
  Filled 2015-01-10: qty 15

## 2015-01-10 MED ORDER — ACETAMINOPHEN 10 MG/ML IV SOLN
INTRAVENOUS | Status: DC | PRN
  Administered 2015-01-10: 1000 mg via INTRAVENOUS

## 2015-01-10 MED ORDER — FENTANYL CITRATE (PF) 100 MCG/2ML IJ SOLN
INTRAMUSCULAR | Status: AC
  Filled 2015-01-10: qty 2

## 2015-01-10 MED ORDER — EPHEDRINE SULFATE 50 MG/ML IJ SOLN
INTRAMUSCULAR | Status: DC | PRN
  Administered 2015-01-10 (×2): 10 mg via INTRAVENOUS

## 2015-01-10 MED ORDER — FENTANYL CITRATE (PF) 100 MCG/2ML IJ SOLN: 25.0000 ug | INTRAMUSCULAR | Status: DC | PRN

## 2015-01-10 MED ORDER — OXYCODONE HCL 5 MG PO TABS: 5.0000 mg | ORAL_TABLET | Freq: Once | ORAL | Status: DC | PRN

## 2015-01-10 MED ORDER — BUPIVACAINE HCL (PF) 0.5 % IJ SOLN
INTRAMUSCULAR | Status: DC | PRN
  Administered 2015-01-10: 2.5 mL

## 2015-01-10 MED ORDER — PHENYLEPHRINE HCL 10 MG/ML IJ SOLN
INTRAMUSCULAR | Status: AC
  Filled 2015-01-10: qty 1

## 2015-01-10 MED ORDER — NEOMYCIN-POLYMYXIN B GU 40-200000 IR SOLN
Status: DC | PRN
Start: 2015-01-10 — End: 2015-01-10
  Administered 2015-01-10: 16 mL

## 2015-01-10 MED ORDER — DELFLEX-LC/2.5% DEXTROSE 394 MOSM/L IP SOLN
INTRAPERITONEAL | Status: DC
Start: 2015-01-10 — End: 2015-01-14
  Filled 2015-01-10 (×3): qty 3000

## 2015-01-10 MED ORDER — PROPOFOL 500 MG/50ML IV EMUL
INTRAVENOUS | Status: DC | PRN
  Administered 2015-01-10: 30 ug/kg/min via INTRAVENOUS

## 2015-01-10 MED ORDER — PROPOFOL 10 MG/ML IV BOLUS
INTRAVENOUS | Status: DC | PRN
  Administered 2015-01-10: 30 mg via INTRAVENOUS

## 2015-01-10 MED ORDER — OXYCODONE HCL 5 MG/5ML PO SOLN: 5.0000 mg | Freq: Once | ORAL | Status: DC | PRN

## 2015-01-10 MED ORDER — PHENYLEPHRINE HCL 10 MG/ML IJ SOLN
INTRAMUSCULAR | Status: DC | PRN
  Administered 2015-01-10 (×4): 100 ug via INTRAVENOUS
  Administered 2015-01-10: 200 ug via INTRAVENOUS
  Administered 2015-01-10: 100 ug via INTRAVENOUS

## 2015-01-10 MED ORDER — ACETAMINOPHEN 10 MG/ML IV SOLN
INTRAVENOUS | Status: AC
  Filled 2015-01-10: qty 100

## 2015-01-10 MED ORDER — CEFAZOLIN SODIUM 1-5 GM-% IV SOLN
1.0000 g | Freq: Three times a day (TID) | INTRAVENOUS | Status: AC
Start: 2015-01-10 — End: 2015-01-11
  Administered 2015-01-10 – 2015-01-11 (×3): 1 g via INTRAVENOUS
  Filled 2015-01-10 (×3): qty 50

## 2015-01-10 MED ORDER — GENTAMICIN SULFATE 0.1 % EX CREA
1.0000 "application " | TOPICAL_CREAM | Freq: Every day | CUTANEOUS | Status: DC
  Filled 2015-01-10: qty 15

## 2015-01-10 MED ORDER — NEOMYCIN-POLYMYXIN B GU 40-200000 IR SOLN
Status: AC
  Filled 2015-01-10: qty 16

## 2015-01-10 MED ORDER — ENOXAPARIN SODIUM 30 MG/0.3ML ~~LOC~~ SOLN: 30.0000 mg | SUBCUTANEOUS | Status: DC

## 2015-01-10 MED ORDER — HEPARIN SODIUM (PORCINE) 5000 UNIT/ML IJ SOLN
5000.0000 [IU] | Freq: Two times a day (BID) | INTRAMUSCULAR | Status: DC
  Administered 2015-01-10 – 2015-01-13 (×5): 5000 [IU] via SUBCUTANEOUS
  Filled 2015-01-10 (×7): qty 1

## 2015-01-10 SURGICAL SUPPLY — 50 items
BAG DECANTER FOR FLEXI CONT (MISCELLANEOUS) ×3 IMPLANT
BLADE SAW 1 (BLADE) ×3 IMPLANT
BRUSH SCRUB 4% CHG (MISCELLANEOUS) ×3 IMPLANT
CANISTER SUCT 1200ML W/VALVE (MISCELLANEOUS) ×3 IMPLANT
CANISTER SUCT 3000ML (MISCELLANEOUS) ×6 IMPLANT
CAPT HIP HEMI 2 ×3 IMPLANT
CATH FOL LEG HOLDER (MISCELLANEOUS) IMPLANT
CEMENT HV SMART SET (Cement) ×6 IMPLANT
DRAPE IMP U-DRAPE 54X76 (DRAPES) ×3 IMPLANT
DRAPE INCISE IOBAN 66X60 STRL (DRAPES) ×3 IMPLANT
DRAPE SURG 17X23 STRL (DRAPES) ×3 IMPLANT
DRAPE TABLE BACK 80X90 (DRAPES) ×3 IMPLANT
DRAPE U-SHAPE 47X51 STRL (DRAPES) ×3 IMPLANT
DRSG AQUACEL AG ADV 3.5X10 (GAUZE/BANDAGES/DRESSINGS) ×3 IMPLANT
DRSG DERMACEA 8X12 NADH (GAUZE/BANDAGES/DRESSINGS) IMPLANT
DURAPREP 26ML APPLICATOR (WOUND CARE) ×6 IMPLANT
GAUZE PACK 2X3YD (MISCELLANEOUS) ×3 IMPLANT
GAUZE SPONGE 4X4 12PLY STRL (GAUZE/BANDAGES/DRESSINGS) IMPLANT
GLOVE BIO SURGEON STRL SZ7.5 (GLOVE) ×3 IMPLANT
GOWN STRL REUS W/ TWL LRG LVL3 (GOWN DISPOSABLE) ×2 IMPLANT
GOWN STRL REUS W/TWL LRG LVL3 (GOWN DISPOSABLE) ×4
HANDPIECE SUCTION TUBG SURGILV (MISCELLANEOUS) ×3 IMPLANT
HEMOVAC 400CC 10FR (MISCELLANEOUS) IMPLANT
HOOD PEEL AWAY FACE SHEILD DIS (HOOD) IMPLANT
KIT RM TURNOVER STRD PROC AR (KITS) ×3 IMPLANT
NDL SAFETY 18GX1.5 (NEEDLE) ×3 IMPLANT
NEEDLE FILTER BLUNT 18X 1/2SAF (NEEDLE) ×2
NEEDLE FILTER BLUNT 18X1 1/2 (NEEDLE) ×1 IMPLANT
NS IRRIG 1000ML POUR BTL (IV SOLUTION) ×3 IMPLANT
PACK HIP PROSTHESIS (MISCELLANEOUS) ×3 IMPLANT
PAD GROUND ADULT SPLIT (MISCELLANEOUS) ×3 IMPLANT
PILLOW ABDUC M (MISCELLANEOUS) ×3 IMPLANT
PRESSURIZER CEMENT PROX FEM SM (MISCELLANEOUS) IMPLANT
PRESSURIZER FEM CANAL M (MISCELLANEOUS) ×3 IMPLANT
SOL .9 NS 3000ML IRR  AL (IV SOLUTION) ×2
SOL .9 NS 3000ML IRR UROMATIC (IV SOLUTION) ×1 IMPLANT
STAPLER SKIN PROX 35W (STAPLE) ×3 IMPLANT
STRYKER HOWMEDICA INTRAMEDULLARY BRUSH ×3 IMPLANT
SUT ETHIBOND #5 BRAIDED 30INL (SUTURE) ×6 IMPLANT
SUT VIC AB 0 CT1 36 (SUTURE) ×6 IMPLANT
SUT VIC AB 2-0 CT1 27 (SUTURE) ×2
SUT VIC AB 2-0 CT1 TAPERPNT 27 (SUTURE) ×1 IMPLANT
SUT VIC AB 2-0 SH 27 (SUTURE) ×2
SUT VIC AB 2-0 SH 27XBRD (SUTURE) ×1 IMPLANT
SYR 20CC LL (SYRINGE) ×3 IMPLANT
SYR TB 1ML LUER SLIP (SYRINGE) ×3 IMPLANT
TAPE MICROFOAM 4IN (TAPE) IMPLANT
TIP COAXIAL FEMORAL CANAL (MISCELLANEOUS) ×3 IMPLANT
TOWER CARTRIDGE SMART MIX (DISPOSABLE) ×3 IMPLANT
WATER STERILE IRR 1000ML POUR (IV SOLUTION) ×3 IMPLANT

## 2015-01-10 NOTE — Care Management Important Message (Signed)
Important Message  Patient Details  Name: Lydia Clark MRN: [id number] Date of Birth: 05-Sep-1940   Medicare Important Message Given:  Yes-second notification given    Mardene Speak, RN 01/10/2015, 4:02 PM

## 2015-01-10 NOTE — Transfer of Care (Signed)
Immediate Anesthesia Transfer of Care Note  Patient: [name]  Procedure(s) Performed: Procedure(s): ARTHROPLASTY BIPOLAR HIP (HEMIARTHROPLASTY) (Left)  Patient Location: PACU  Anesthesia Type:MAC and Spinal  Level of Consciousness: awake and alert   Airway & Oxygen Therapy: Patient Spontanous Breathing and Patient connected to face mask oxygen  Post-op Assessment: Report given to RN and Post -op Vital signs reviewed and stable  Post vital signs: Reviewed and stable  Last Vitals:  Filed Vitals:   01/10/15 1126  BP: 122/65  Pulse: 63  Temp: 35.7 C  Resp: 9    Complications: No apparent anesthesia complications

## 2015-01-10 NOTE — Brief Op Note (Signed)
01/09/2015 - 01/10/2015  11:44 AM  PATIENT:  Lydia Clark  74 y.o. female  PRE-OPERATIVE DIAGNOSIS:  left hip fracture  POST-OPERATIVE DIAGNOSIS:  Same  PROCEDURE:  ARTHROPLASTY UNIPOLAR HIP (HEMIARTHROPLASTY)  SURGEON:  Claud Kelp, MD  ANESTHESIA:   General  PRE-OP ANTIBIOTICS: 2gm ancef  TOURNIQUET TIME: none  EBL: [id number]  COMPLICATIONS: none  DISPO: stable to PACU

## 2015-01-10 NOTE — Anesthesia Postprocedure Evaluation (Deleted)
  Anesthesia Post-op Note  Patient: Lydia Clark  Procedure(s) Performed: Procedure(s): ARTHROPLASTY BIPOLAR HIP (HEMIARTHROPLASTY) (Left)  Anesthesia type:Spinal, MAC  Patient location: PACU  Post pain: Pain level controlled  Post assessment: Post-op Vital signs reviewed, Patient's Cardiovascular Status Stable, Respiratory Function Stable, Patent Airway and No signs of Nausea or vomiting  Post vital signs: Reviewed and stable  Last Vitals:  Filed Vitals:   01/10/15 1257  BP: 132/76  Pulse: 72  Temp:   Resp: 16    Level of consciousness: awake, alert  and patient cooperative  Complications: No apparent anesthesia complications

## 2015-01-10 NOTE — Progress Notes (Signed)
[place] at [facility]                                                                                                                                                                                            Patient Demographics   Xiamara Hulet, is a 74 y.o. female, DOB - September 10, 1940, NTZ:[id number]  Admit date - 01/09/2015   Admitting Physician Claud Kelp, MD  Outpatient Primary MD for the patient is HAWKINS,EDWARD L, MD   LOS - 1  Subjective:     Review of Systems:   CONSTITUTIONAL: No documented fever. No fatigue, weakness. No weight gain, no weight loss.  EYES: No blurry or double vision.  ENT: No tinnitus. No postnasal drip. No redness of the oropharynx.  RESPIRATORY: No cough, no wheeze, no hemoptysis. No dyspnea.  CARDIOVASCULAR: No chest pain. No orthopnea. No palpitations. No syncope.  GASTROINTESTINAL: No nausea, no vomiting or diarrhea. No abdominal pain. No melena or hematochezia.  GENITOURINARY: No dysuria or hematuria.  ENDOCRINE: No polyuria or nocturia. No heat or cold intolerance.  HEMATOLOGY: No anemia. No bruising. No bleeding.  INTEGUMENTARY: No rashes. No lesions.  MUSCULOSKELETAL: No arthritis. No swelling. No gout. + hip pain NEUROLOGIC: No numbness, tingling, or ataxia. No seizure-type activity.  PSYCHIATRIC: No anxiety. No insomnia. No ADD.    Vitals:   Filed Vitals:   01/10/15 1235 01/10/15 1238 01/10/15 1257 01/10/15 1330  BP:  139/75 132/76 133/68  Pulse: 68 67 72 72  Temp:      TempSrc:      Resp: 17  16 17   Height:      Weight:      SpO2: 97%  98% 91%    Wt Readings from Last 3 Encounters:  01/09/15 57.108 kg (125 lb 14.4 oz)  07/22/14 59.421 kg (131 lb)  06/16/14 58.968 kg (130 lb)     Intake/Output Summary (Last 24 hours) at 01/10/15 1352 Last data filed at 01/10/15 1226  Gross per 24 hour  Intake    750 ml  Output    250 ml  Net    500 ml    Physical Exam:   GENERAL:  Pleasant-appearing in no apparent distress.  HEAD, EYES, EARS, NOSE AND THROAT: Atraumatic, normocephalic. Extraocular muscles are intact. Pupils equal and reactive to light. Sclerae anicteric. No conjunctival injection. No oro-pharyngeal erythema.  NECK: Supple. There is no jugular venous distention. No bruits, no lymphadenopathy, no thyromegaly.  HEART: Regular rate and rhythm,. No murmurs, no rubs, no clicks.  LUNGS: Clear to auscultation bilaterally. No rales or rhonchi. No wheezes.  ABDOMEN: Soft,  flat, nontender, nondistended. Has good bowel sounds. No hepatosplenomegaly appreciated.  EXTREMITIES: No evidence of any cyanosis, clubbing, or peripheral edema.  +2 pedal and radial pulses bilaterally.  NEUROLOGIC: The patient is alert, awake, and oriented x3 with no focal motor or sensory deficits appreciated bilaterally.  SKIN: Moist and warm with no rashes appreciated.  Psych: Not anxious, depressed LN: No inguinal LN enlargement    Antibiotics   Anti-infectives    Start     Dose/Rate Route Frequency Ordered Stop   01/10/15 1400  ceFAZolin (ANCEF) IVPB 1 g/50 mL premix     1 g 100 mL/hr over 30 Minutes Intravenous 3 times per day 01/10/15 1326 01/11/15 1359      Medications   Scheduled Meds: . aspirin EC  81 mg Oral Daily  .  ceFAZolin (ANCEF) IV  1 g Intravenous 3 times per day  . cholecalciferol  1,000 Units Oral Daily  . dialysis solution 2.5% low-MG/low-CA   Intraperitoneal Q24H  . fentaNYL      . furosemide  40 mg Oral Daily  . gentamicin ointment   Topical Daily  . heparin subcutaneous  5,000 Units Subcutaneous Q12H  . irbesartan  37.5 mg Oral Daily  . loratadine  10 mg Oral Daily  . mirtazapine  15 mg Oral QHS  . mometasone-formoterol  2 puff Inhalation BID  . nicotine  14 mg Transdermal Daily  . pantoprazole  40 mg Oral Daily  . rosuvastatin  10 mg Oral QPM  . sodium chloride  3 mL Intravenous Q12H  . tiotropium  18 mcg Inhalation QHS   Continuous Infusions:   PRN Meds:.sodium chloride, acetaminophen **OR** acetaminophen, albuterol, fentaNYL (SUBLIMAZE) injection, heparin, lactulose, LORazepam, morphine injection, morphine injection, ondansetron (ZOFRAN) IV, oxyCODONE **OR** oxyCODONE, sodium chloride   Data Review:   Micro Results No results found for this or any previous visit (from the past 240 hour(s)).  Radiology Reports Dg Chest 1 View  01/09/2015  CLINICAL DATA:  74 year old female with history of left hip fracture. Preoperative study. EXAM: CHEST 1 VIEW COMPARISON:  Chest x-ray 04/07/2014. FINDINGS: Emphysematous changes throughout the lungs bilaterally. No acute consolidative airspace disease. No pleural effusions. No definite suspicious appearing pulmonary nodules or masses. No evidence of pulmonary edema. Heart size is upper limits of normal. Upper mediastinal contours are within normal limits. Atherosclerosis in the thoracic aorta. IMPRESSION: 1. No radiographic evidence of acute cardiopulmonary disease. 2. Emphysema. 3. Atherosclerosis. Electronically Signed   By: Vinnie Langton M.D.   On: 01/09/2015 18:00   Dg Pelvis 1-2 Views  01/10/2015  CLINICAL DATA:  74 year old female status post left femur fracture repair. Initial encounter. EXAM: PELVIS - 1-2 VIEW COMPARISON:  Lennie Odor 07/2014. FINDINGS: AP view of the pelvis at 1141 hours. Proximal left femur arthroplasty now in place. The femoral head component appears normally aligned with the acetabulum on this AP view. Postoperative changes to the surrounding soft tissues including subcutaneous gas, overlying skin staples. Other visualized osseous structures appear stable. IMPRESSION: Left proximal femoral arthroplasty with no adverse features identified. Electronically Signed   By: Genevie Ann M.D.   On: 01/10/2015 12:59   Ct Head Wo Contrast  01/09/2015  CLINICAL DATA:  Multiple falls, bruise along the temporal area EXAM: CT HEAD WITHOUT CONTRAST TECHNIQUE: Contiguous axial images were obtained  from the base of the skull through the vertex without intravenous contrast. COMPARISON:  None. FINDINGS: There is no evidence of mass effect, midline shift, or extra-axial fluid collections. There is no evidence  of a space-occupying lesion or intracranial hemorrhage. There is no evidence of a cortical-based area of acute infarction. There is generalized cerebral atrophy. There is periventricular white matter low attenuation likely secondary to microangiopathy. The ventricles and sulci are appropriate for the patient's age. The basal cisterns are patent. Visualized portions of the orbits are unremarkable. The visualized portions of the paranasal sinuses and mastoid air cells are unremarkable. Cerebrovascular atherosclerotic calcifications are noted. The osseous structures are unremarkable. IMPRESSION: 1. No acute intracranial pathology. 2. Chronic microvascular disease and cerebral atrophy. 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 EXAM: DG HIP (WITH OR WITHOUT PELVIS) 2-3V LEFT COMPARISON:  No priors. FINDINGS: Three views of the bony pelvis and left hip demonstrate an acute transcervical left femoral neck fracture which appears mildly comminuted. There is approximately 1.6 cm of proximal migration of the femur relative to the proximal fragment. Femoral head remains located. Small adjacent bony fragment immediately superior and lateral to the femoral head also noted. Bony pelvis itself appears intact, as does the visualized portions of the right proximal femur. IMPRESSION: 1. Acute displaced minimally comminuted transcervical left femoral neck fracture, as above. Electronically Signed   By: Vinnie Langton M.D.   On: 01/09/2015 17:42     CBC  Recent Labs Lab 01/09/15 1731  WBC 8.5  HGB  13.0  HCT 38.7  PLT 228  MCV 95.0  MCH 31.8  MCHC 33.5  RDW 15.8*    Chemistries   Recent Labs Lab 01/09/15 1731  NA 135  K 3.9  CL 96*  CO2 29  GLUCOSE 92  BUN 49*  CREATININE 6.81*  CALCIUM 8.9  AST 30  ALT 17  ALKPHOS 71  BILITOT 0.2*   ------------------------------------------------------------------------------------------------------------------ estimated creatinine clearance is 6.7 mL/min (by C-G formula based on Cr of 6.81). ------------------------------------------------------------------------------------------------------------------ No results for input(s): HGBA1C in the last 72 hours. ------------------------------------------------------------------------------------------------------------------ No results for input(s): CHOL, HDL, LDLCALC, TRIG, CHOLHDL, LDLDIRECT in the last 72 hours. ------------------------------------------------------------------------------------------------------------------ No results for input(s): TSH, T4TOTAL, T3FREE, THYROIDAB in the last 72 hours.  Invalid input(s): FREET3 ------------------------------------------------------------------------------------------------------------------ No results for input(s): VITAMINB12, FOLATE, FERRITIN, TIBC, IRON, RETICCTPCT in the last 72 hours.  Coagulation profile  Recent Labs Lab 01/09/15 1731  INR 0.85    No results for input(s): DDIMER in the last 72 hours.  Cardiac Enzymes No results for input(s): CKMB, TROPONINI, MYOGLOBIN in the last 168 hours.  Invalid input(s): CK ------------------------------------------------------------------------------------------------------------------ Invalid input(s): POCBNP    Assessment & Plan   Active Problems: 1  Femora. Acute left femoral neck fracture  s/p repair, pt eval   2. End-stage renal disease on peritoneal dialysis Pd to start today  3. Essential hypertension Continue  Avapro, and lasix   4. Chronic history of  COPD no exacerbation Continue inhalers  As taking at home  5. Tobacco abuse disorder Counseled  Done  6. hyperlipdemia continue simvastatin  7. Misc: dvt prop heparin     Code Status Orders        Start     Ordered   01/09/15 2043  Full code   Continuous     01/09/15 2042    Advance Directive Documentation        Most Recent Value   Type of Advance  Directive  Living will   Pre-existing out of facility DNR order (yellow form or pink MOST form)     "MOST" Form in Place?             Consults  Ortho, nephrology   DVT Prophylaxis    Heparin   Lab Results  Component Value Date   PLT 228 01/09/2015     Time Spent in minutes    76min.   Dustin Flock M.D on 01/10/2015 at 1:52 PM  Between 7am to 6pm - Pager - [phone]  After 6pm go to [email or web address] - password [email or web address] [place] [facility]   Office  [phone]

## 2015-01-10 NOTE — Anesthesia Procedure Notes (Signed)
Spinal Patient location during procedure: OR Start time: 01/10/2015 8:55 AM End time: 01/10/2015 8:56 AM Staffing Anesthesiologist: Martha Clan Performed by: anesthesiologist  Preanesthetic Checklist Completed: patient identified, site marked, surgical consent, pre-op evaluation, timeout performed, IV checked, risks and benefits discussed and monitors and equipment checked Spinal Block Patient position: right lateral decubitus Prep: ChloraPrep Patient monitoring: heart rate, continuous pulse ox, blood pressure and cardiac monitor Approach: midline Location: L4-5 Injection technique: single-shot Needle Needle type: Whitacre and Introducer  Needle gauge: 24 G Needle length: 9 cm Assessment Sensory level: T10 Additional Notes Negative paresthesia. Negative blood return. Positive free-flowing CSF. Expiration date of kit checked and confirmed. Patient tolerated procedure well, without complications.

## 2015-01-10 NOTE — Anesthesia Preprocedure Evaluation (Addendum)
Anesthesia Evaluation  Patient identified by MRN, date of birth, ID band Patient awake    Reviewed: Allergy & Precautions, H&P , NPO status , Patient's Chart, lab work & pertinent test results  History of Anesthesia Complications Negative for: history of anesthetic complications  Airway Mallampati: III  TM Distance: >3 FB Neck ROM: limited    Dental  (+) Poor Dentition, Chipped, Missing, Upper Dentures   Pulmonary shortness of breath, pneumonia, COPD, Current Smoker,    Pulmonary exam normal breath sounds clear to auscultation       Cardiovascular Exercise Tolerance: Poor hypertension, (-) angina+ DOE  (-) Past MI Normal cardiovascular exam Rhythm:regular Rate:Normal     Neuro/Psych PSYCHIATRIC DISORDERS Anxiety negative neurological ROS     GI/Hepatic Neg liver ROS, GERD  ,  Endo/Other  negative endocrine ROS  Renal/GU DialysisRenal disease  negative genitourinary   Musculoskeletal  (+) Arthritis ,   Abdominal   Peds  Hematology negative hematology ROS (+)   Anesthesia Other Findings Past Medical History:   Allergy                                                      Cancer ([place])                                    2008           Comment:left breast   Hypertension                                    2006         Personal history of tobacco use, presenting ha* 2012           Comment:30 yrs smoking   Endocrine disorder                              2008           Comment:thyroid   Personal history of malignant neoplasm of brea* 2008         Breast screening, unspecified                   2013         Special screening for malignant neoplasms, col* 2013         COPD (chronic obstructive pulmonary disease) (*              PE (pulmonary embolism)                                        Comment:patient denies   Swelling of throat                                             Comment:swelling at base of throat,    Anxiety  Comment:anxiety   Pneumonia                                                      Comment:hx   Heart murmur                                                   Comment:child   History of kidney stones                                     GERD (gastroesophageal reflux disease)                       Arthritis                                                    Multinodular goiter                                            Comment:followed by Dr. Carloyn Manner @ [facility]   Dialysis patient [facility])                          2014         Chronic kidney disease                          2014           Comment:stage IV chronic   Past Surgical History:   BREAST SURGERY                                  Left 2008           Comment:lumpectomy   fooy                                            Left                Comment:lft foot   FOOT SURGERY                                     2005         SPLENECTOMY, TOTAL                                              Comment:not sure if partial or total   TONSILLECTOMY  2005         BREAST BIOPSY                                    2005         APPENDECTOMY                                     2005         REPLACEMENT TOTAL KNEE                          Left 2012           Comment:Partial    COLONOSCOPY                                      2007           Comment:done in [place]                  Right 07/02/2012      Comment:Procedure: INSERTION OF DIALYSIS CATHETER;                Surgeon: Elam Dutch, MD;  Location: [place];  Service: Vascular;  Laterality: Right;                Right Internal Jugular   INSERTION OF DIALYSIS CATHETER                  Right April 29 *     Comment:right chest/ temporary cath  BMI    Body Mass Index   19.71 kg/m 2    Patient has medical clearance to proceed with surgery   Reproductive/Obstetrics negative OB ROS                           Anesthesia Physical Anesthesia Plan  ASA: IV  Anesthesia Plan: Spinal and MAC   Post-op Pain Management:    Induction:   Airway Management Planned:   Additional Equipment:   Intra-op Plan:   Post-operative Plan:   Informed Consent: I have reviewed the patients History and Physical, chart, labs and discussed the procedure including the risks, benefits and alternatives for the proposed anesthesia with the patient or authorized representative who has indicated his/her understanding and acceptance.   Dental Advisory Given  Plan Discussed with: Anesthesiologist, CRNA and Surgeon  Anesthesia Plan Comments:         Anesthesia Quick Evaluation

## 2015-01-10 NOTE — Care Management Note (Signed)
PD patient from [facility].  I will send hospital records at discharge.  [facility] Liaison  [phone]

## 2015-01-10 NOTE — Op Note (Signed)
01/09/2015 - 01/10/2015  11:36 AM  PATIENT:  Lydia Clark   MRN: [id number]  PRE-OPERATIVE DIAGNOSIS:  left hip fracture  POST-OPERATIVE DIAGNOSIS:  same  PROCEDURE:  Procedure(s): ARTHROPLASTY  HIP (HEMIARTHROPLASTY)  PREOPERATIVE INDICATIONS:  ARAYLA KRUSCHKE is an 74 y.o. female who was admitted 01/09/2015 with a diagnosis of left femoral neck fracture<principal problem not specified> and elected for surgical management.  The risks benefits and alternatives were discussed with the patient including but not limited to the risks of nonoperative treatment, versus surgical intervention including infection, bleeding, nerve injury, periprosthetic fracture, the need for revision surgery, dislocation, leg length discrepancy, blood clots, cardiopulmonary complications, morbidity, mortality, among others, and they were willing to proceed.  Predicted outcome is good, although there will be at least a six to nine month expected recovery.   OPERATIVE REPORT     SURGEON:  Stacy Gardner, MD    ASSISTANT:  NONE  ANESTHESIA:  General    COMPLICATIONS:  None.      COMPONENTS:  Depuy [place] Basic Femoral Fracture stem size 3, with a stem centralizer, a cement restrictor, with a 0 spacer and a size 47 fracture head unipolar hip ball, and a total of 2 bags of Depuy CMW 1 Bone Cement.    PROCEDURE IN DETAIL: The patient was met in the holding area and identified.  The appropriate hip  was marked at the operative site. The patient was then transported to the OR and  placed under general anesthesia.  At that point, the patient was  placed in the lateral decubitus position with the operative side up and  secured to the operating room table and all bony prominences padded.     The operative lower extremity was prepped from the iliac crest to the toes.  Sterile draping was performed.  Time out was performed prior to incision.      A routine posterolateral approach was utilized via sharp dissection  carried down  to the subcutaneous tissue.  Gross bleeders were Bovie  coagulated.  The iliotibial band was identified and incised  along the length of the skin incision.  Self-retaining retractors were  inserted.  With the hip internally rotated, the short external rotators  were identified. The piriformis was tagged with ethibond, and the hip capsule released and tagged.  The femoral neck was exposed, and I resected the femoral neck using the appropriate jig. This was performed at approximately a thumb's breadth above the lesser trochanter.    I then exposed the deep acetabulum, cleared out any tissue including the ligamentum teres.   I then prepared the proximal femur using the canal initiator, the canal finder, the lateralizing reamer, and then sequentially broached.  A trial utilized, and I reduced the hip and it was found to have excellent stability with functional range of motion. The trial components were then removed.   I then placed a cement restrictor, and cemented the real components in place. All excess cement was removed. Once the cement had cured, I impacted the real head ball into place. The hip was then reduced and taken through functional range of motion and found to have excellent stability. Leg lengths were restored.  I then reapaired the posterior capsule followed by the piriformis tendon using ethibond suture.  I then irrigated the hip copiously again with pulse lavage, and repaired the fascia with Vicryl, placed 1 gm vancomycin powder in the wound, and then followed by Vicryl for the subcutaneous tissue, staples for the skin, and  an aquacell dressing. A hip abduction pillow was placed.  The patient was then awakened and returned to PACU in stable and satisfactory condition. There were no complications.    01/10/2015 11:36 AM

## 2015-01-10 NOTE — Progress Notes (Signed)
Notified this morning that patient is going for surgery PD will take 8 hrs to complete  Her electrolytes are acceptable Recommend to proceed with surgery WIll get PD done later tonight

## 2015-01-10 NOTE — Care Management Note (Signed)
Case Management Note  Patient Details  Name: Lydia Clark MRN: [id number] Date of Birth: 14-Apr-1940  Subjective/Objective:     73yo [name] was admitted 01/09/15 with a left femoral fracture after a fall at church. Surgical repair on 01/10/15. Also ESRD on dialysis. PCP=Dr Sinda Du. Pharmacy=[facility] in [place] and [employer]. Has a rolling walker and a bedside commode at home. [name] reports that she lives alone with her cat. No home oxygen and no home health services. [name] reports that she was a hospice patient long ago but does not see them currently. Anticipate discharge to SNF. Case management will follow for discharge planning. PT consult pending.                Action/Plan:   Expected Discharge Date:                  Expected Discharge Plan:     In-House Referral:     Discharge planning Services     Post Acute Care Choice:    Choice offered to:     DME Arranged:    DME Agency:     HH Arranged:    [place] Agency:     Status of Service:     Medicare Important Message Given:  Yes-second notification given Date Medicare IM Given:    Medicare IM give by:    Date Additional Medicare IM Given:    Additional Medicare Important Message give by:     If discussed at [place] of Stay Meetings, dates discussed:    Additional Comments:  Marti Mclane A, RN 01/10/2015, 4:14 PM

## 2015-01-10 NOTE — Evaluation (Signed)
Physical Therapy Evaluation Patient Details Name: ZELENA BUSHONG MRN: [id number] DOB: [birth year] Today's Date: 01/10/2015   History of Present Illness  Ebba Goll is a 74 y.o. female with a known history of hypertension, end-stage renal disease on peritoneal dialysis, chronic COPD and continues to smoke is presenting to the ED after she sustained a fall. X-ray has revealed left femoral neck fracture and CT head is negative for any acute abnormalities. Patient is s/p left hip hemiarthroplasty. She is on posterior hip precautions at this time.   Clinical Impression  74 yo Female s/p left hemiarthroplasty of hip. Patient is post op day #0. She was just given morphine prior to PT eval. PT able to obtain some history from family. She was independent with RW prior to admittance. Patient lives alone with her cat. Patient was very lethargic during evaluation. She demonstrates good RLE strength. LLE hip strength testing deferred but knee and ankle strength are good. Patient is mod A for bed mobility. She transitioned to sitting edge of bed and was able to maintain position for 3 min approximately. Patient then became very dizzy and lethargic. PT helped patient get back in bed. Patient would benefit from additional skilled PT intervention to improve LE strength, gait ability and functional mobility. While patient does live alone, her family reported that they would be happy to stay with her as needed if she were discharged home.    Follow Up Recommendations Home health PT    Equipment Recommendations   (to be determined; family not sure if patient still has bedside commode)    Recommendations for Other Services       Precautions / Restrictions Precautions Precautions: Fall Restrictions Weight Bearing Restrictions: Yes LLE Weight Bearing: Weight bearing as tolerated Other Position/Activity Restrictions: posterior hip precautions      Mobility  Bed Mobility Overal bed mobility: Needs  Assistance Bed Mobility: Supine to Sit;Sit to Supine     Supine to sit: Mod assist Sit to supine: Mod assist   General bed mobility comments: Patient required mod VCS for hand placement and to use RLE and RUE to assist with supine to sit; She also required mod A for lifting LLE into bed going sit to supine; patient able to initiate scooting but has difficult time scooting edge of bed;  Transfers                 General transfer comment: unable to transfer at this time;patient sat edge of bed and got very dizzy. Will attempt transfer at next treatment.  Ambulation/Gait             General Gait Details: unable at this time  Stairs            Wheelchair Mobility    Modified Rankin (Stroke Patients Only)       Balance Overall balance assessment: Needs assistance Sitting-balance support: Bilateral upper extremity supported;Feet supported Sitting balance-Leahy Scale: Fair Sitting balance - Comments: Patient able to sit edge of  bed with supervision; dynamic sitting balance is poor;        Standing balance comment: not assessed                             Pertinent Vitals/Pain Pain Assessment: 0-10 Pain Score: 5  Pain Location: left hip Pain Descriptors / Indicators: Sore;Aching Pain Intervention(s): Limited activity within patient's tolerance;Monitored during session    Home Living Family/patient expects to be discharged to:: Private  residence Living Arrangements: Alone Available Help at Discharge: Family Type of Home: House Home Access: Level entry     [place]: [place]: Environmental consultant - 2 wheels;Cane - single point;Shower seat Additional Comments: family unsure if she has a bedside commode. May need as patient's toilet is low height    Prior Function Level of Independence: Independent with assistive device(s)         Comments: was using RW for gait. Able to bathe, dress and cook for self independently      Hand  Dominance        Extremity/Trunk Assessment   Upper Extremity Assessment: Overall WFL for tasks assessed           Lower Extremity Assessment: RLE deficits/detail;LLE deficits/detail RLE Deficits / Details: ROM is WFL, grossly 4/5 strength, intact light touch sensation LLE Deficits / Details: decreased hip ROM (knee and ankle ROM is WNL); hip testing not performed due to post surgical, knee/ankle grossly 4/5; intact light touch sensation  Cervical / Trunk Assessment: Kyphotic  Communication   Communication: No difficulties  Cognition Arousal/Alertness: Lethargic Behavior During Therapy: WFL for tasks assessed/performed Overall Cognitive Status: Within Functional Limits for tasks assessed                      General Comments General comments (skin integrity, edema, etc.): intact by gross assessment    Exercises Other Exercises Other Exercises: PT educated patient in posterior hip precautions. Patient able to verbalize understanding through teachback. Patient was very lethargic and may need further instruction tomorrow depending on what she remembers.      Assessment/Plan    PT Assessment Patient needs continued PT services  PT Diagnosis Difficulty walking;Generalized weakness   PT Problem List Decreased strength;Decreased range of motion;Decreased activity tolerance;Decreased balance;Decreased mobility;Decreased safety awareness;Pain  PT Treatment Interventions DME instruction;Gait training;Stair training;Functional mobility training;Therapeutic activities;Therapeutic exercise;Balance training;Patient/family education   PT Goals (Current goals can be found in the Care Plan section) Acute Rehab PT Goals Patient Stated Goal: to go home PT Goal Formulation: With patient/family Time For Goal Achievement: 01/24/15 Potential to Achieve Goals: Fair    Frequency BID   Barriers to discharge Inaccessible home environment has 3 steps to den; however patient's bedroom,  bathroom and kitchen is all on main floor; family has stated that they would stay with her as long as needed;    Co-evaluation               End of Session Equipment Utilized During Treatment: Gait belt Activity Tolerance: Patient limited by fatigue;Patient limited by pain Patient left: in bed;with call bell/phone within reach;with bed alarm set;with family/visitor present;with SCD's reapplied Nurse Communication: Mobility status         Time: 1434-1500 PT Time Calculation (min) (ACUTE ONLY): 26 min   Charges:   PT Evaluation $Initial PT Evaluation Tier I: 1 Procedure     PT G Codes:        Hopkins,Marki Frede PT, DPT 01/10/2015, 3:23 PM

## 2015-01-10 NOTE — Progress Notes (Signed)
Patient still not back in her room PD orders placed PD nurse is aware

## 2015-01-11 ENCOUNTER — Encounter: Payer: Self-pay | Admitting: Orthopedic Surgery

## 2015-01-11 LAB — CBC
HCT: 36.1 % (ref 35.0–47.0)
HEMOGLOBIN: 11.8 g/dL — AB (ref 12.0–16.0)
MCH: 30.9 pg (ref 26.0–34.0)
MCHC: 32.8 g/dL (ref 32.0–36.0)
MCV: 94.3 fL (ref 80.0–100.0)
Platelets: 252 10*3/uL (ref 150–440)
RBC: 3.82 MIL/uL (ref 3.80–5.20)
RDW: 15.4 % — ABNORMAL HIGH (ref 11.5–14.5)
WBC: 8.5 10*3/uL (ref 3.6–11.0)

## 2015-01-11 LAB — BASIC METABOLIC PANEL
Anion gap: 7 (ref 5–15)
BUN: 50 mg/dL — AB (ref 6–20)
CHLORIDE: 101 mmol/L (ref 101–111)
CO2: 27 mmol/L (ref 22–32)
CREATININE: 6.85 mg/dL — AB (ref 0.44–1.00)
Calcium: 8.3 mg/dL — ABNORMAL LOW (ref 8.9–10.3)
GFR calc Af Amer: 6 mL/min — ABNORMAL LOW (ref >60)
GFR calc non Af Amer: 5 mL/min — ABNORMAL LOW (ref >60)
GLUCOSE: 117 mg/dL — AB (ref 65–99)
Potassium: 4.2 mmol/L (ref 3.5–5.1)
SODIUM: 135 mmol/L (ref 135–145)

## 2015-01-11 MED ORDER — OXYCODONE HCL 5 MG PO TABS: 5.0000 mg | ORAL_TABLET | ORAL | Status: DC | PRN

## 2015-01-11 MED ORDER — TRAMADOL HCL 50 MG PO TABS
50.0000 mg | ORAL_TABLET | Freq: Two times a day (BID) | ORAL | Status: DC | PRN
  Administered 2015-01-11: 50 mg via ORAL
  Filled 2015-01-11: qty 1

## 2015-01-11 MED ORDER — CHLORHEXIDINE GLUCONATE CLOTH 2 % EX PADS: 6.0000 | MEDICATED_PAD | Freq: Once | CUTANEOUS | Status: DC

## 2015-01-11 MED ORDER — SODIUM CHLORIDE 0.9 % IV SOLN: INTRAVENOUS | Status: DC

## 2015-01-11 NOTE — Care Management Note (Signed)
Dr.Kolluru informed me this afternoon that patient would need In [place] at discharge.  Patient was on PD at [place] prior to this admission.  I have called the clinic and informed them of the change and sending medical records to [place] for modality change on discharge.  Iran Sizer  Dialysis Liaison  [phone]

## 2015-01-11 NOTE — Progress Notes (Addendum)
Subjective: 1 Day Post-Op Procedure(s) (LRB): ARTHROPLASTY BIPOLAR HIP (HEMIARTHROPLASTY) (Left) Patient reports pain as 6 on 0-10 scale.   Patient is well, and has had no acute complaints or problems.  Pt is in End-stage renal disease requiring peritoneal dialysis. Plan is to go Skilled nursing facility after hospital stay. Negative for chest pain and shortness of breath Fever: no Gastrointestinal:Negative for nausea and vomiting this AM.  Complains of mild N/V last night.  Objective: Vital signs in last 24 hours: Temp:  [96.3 F (35.7 C)-98.9 F (37.2 C)] 98.9 F (37.2 C) (11/07 0537) Pulse Rate:  [63-95] 95 (11/07 0537) Resp:  [9-18] 18 (11/07 0537) BP: (118-140)/(57-78) 134/59 mmHg (11/07 0537) SpO2:  [82 %-100 %] 100 % (11/07 0610) FiO2 (%):  [15 %] 15 % (11/07 0610) Weight:  [63.05 kg (139 lb)] 63.05 kg (139 lb) (11/07 0537)  Intake/Output from previous day:  Intake/Output Summary (Last 24 hours) at 01/11/15 0742 Last data filed at 01/10/15 1226  Gross per 24 hour  Intake    750 ml  Output    250 ml  Net    500 ml    Intake/Output this shift:    Labs:  Recent Labs  01/09/15 1731 01/11/15 0332  HGB 13.0 11.8*    Recent Labs  01/09/15 1731 01/11/15 0332  WBC 8.5 8.5  RBC 4.08 3.82  HCT 38.7 36.1  PLT 228 252    Recent Labs  01/09/15 1731 01/11/15 0332  NA 135 135  K 3.9 4.2  CL 96* 101  CO2 29 27  BUN 49* 50*  CREATININE 6.81* 6.85*  GLUCOSE 92 117*  CALCIUM 8.9 8.3*    Recent Labs  01/09/15 1731  INR 0.85     EXAM General - Patient is Alert, Appropriate and Oriented Extremity - Neurologically intact ABD soft Sensation intact distally Intact pulses distally Dorsiflexion/Plantar flexion intact Incision: scant drainage Dressing/Incision - blood tinged drainage Motor Function - intact, moving foot and toes well on exam.   Abdomen soft but distended with tympany.  Normal BS auscultated in all 4 quadrants.  Past Medical History   Diagnosis Date  . Allergy   . Cancer [facility]) 2008    left breast  . Hypertension 2006  . Personal history of tobacco use, presenting hazards to health 2012    30 yrs smoking  . Endocrine disorder 2008    thyroid  . Personal history of malignant neoplasm of breast 2008  . Breast screening, unspecified 2013  . Special screening for malignant neoplasms, colon 2013  . COPD (chronic obstructive pulmonary disease) ([place])   . PE (pulmonary embolism)     patient denies  . Swelling of throat     swelling at base of throat,  . Anxiety     anxiety  . Pneumonia     hx  . Heart murmur     child  . History of kidney stones   . GERD (gastroesophageal reflux disease)   . Arthritis   . Multinodular goiter     followed by Dr. Carloyn Manner @ [facility]  . Dialysis patient ([place]) 2014  . Chronic kidney disease 2014    stage IV chronic     Assessment/Plan: 1 Day Post-Op Procedure(s) (LRB): ARTHROPLASTY BIPOLAR HIP (HEMIARTHROPLASTY) (Left) Active Problems:   Femoral neck fracture, left, closed, initial encounter  Estimated body mass index is 21.77 kg/(m^2) as calculated from the following:   Height as of this encounter: 5\' 7"  (1.702 m).   Weight  as of this encounter: 63.05 kg (139 lb). Advance diet Up with therapy   Pt is alert and oriented this AM.  Pt has distended abdomen but normal BS.  Spoke with nurse, will advance diet slowly and watch for Post-op Ileus. Pt has not had a BM this AM.  Foley removed. Pt SpO2 at 90% at 2L of O2, pt has history of tobacco abuse, will increase O2 this AM. Added Oxycodone and Tramadol to pain control regimen.  Tramadol dosed every 12 hours due to CrCl. Pt ESRD, followed by nephrology. Labs reviewed, electrolytes stable, she underwent PD yesterday.  CBC and BMP ordered for tomorrow morning.  DVT Prophylaxis - Foot Pumps, TED hose and Heparin. Weight-Bearing as tolerated to left leg  J. Cameron Proud, PA-C [facility] 01/11/2015, 7:42 AM

## 2015-01-11 NOTE — Clinical Social Work Note (Signed)
Clinical Social Work Assessment  Patient Details  Name: Lydia Clark MRN: [id number] Date of Birth: March 10, 1940  Date of referral:  01/11/15               Reason for consult:  Facility Placement                Permission sought to share information with:  Chartered certified accountant granted to share information::  Yes, Verbal Permission Granted  Name::      [place]::   [place]   Relationship::     Contact Information:     Housing/Transportation Living arrangements for the past 2 months:  [place] of Information:  Patient, Adult Children, Friend/Neighbor Patient Interpreter Needed:  None Criminal Activity/Legal Involvement Pertinent to Current Situation/Hospitalization:  No - Comment as needed Significant Relationships:  Adult Children Lives with:  Self Do you feel safe going back to the place where you live?  Yes Need for family participation in patient care:  Yes (Comment)  Care giving concerns: Patient lives in [place] alone.    Social Worker assessment / plan:  Holiday representative (CSW) received verbal SNF consult from PT. CSW met with patient and her son Gerald Stabs and her friend were at bedside. Patient was alert and oriented and sitting up in the chair. CSW introduced self and explained role of CSW department. Patient reported that she lives alone in [place] and has a cat. Per patient she does peritoneal dialysis at home and will change to hemodialysis while she is at rehab. Patient and son are agreeable to SNF search and prefer [facility]. Patient reported that she prefers to go to [place] on [address]. Kim dialysis liaison is aware of above.  FL2 complete and faxed out. CSW will continue to follow and assist as needed.     Employment status:  Disabled (Comment on whether or not currently receiving Disability), Retired Forensic scientist:  Medicare PT Recommendations:  [place]  / Referral to community resources:  [place]  Patient/Family's Response to care: Patient is agreeable to [facility] and prefers [facility].   Patient/Family's Understanding of and Emotional Response to Diagnosis, Current Treatment, and Prognosis: Patient was pleasant and thanked CSW for visit.   Emotional Assessment Appearance:  Appears stated age Attitude/Demeanor/Rapport:    Affect (typically observed):  Accepting, Adaptable, Pleasant Orientation:  Oriented to Self, Oriented to Place, Oriented to  Time, Oriented to Situation Alcohol / Substance use:  Not Applicable Psych involvement (Current and /or in the community):  No (Comment)  Discharge Needs  Concerns to be addressed:  Discharge Planning Concerns Readmission within the last 30 days:  No Current discharge risk:  Chronically ill, Dependent with Mobility Barriers to Discharge:  Continued Medical Work up   Elwyn Reach 01/11/2015, 3:44 PM

## 2015-01-11 NOTE — Progress Notes (Signed)
Lydia Clark at [facility]                                                                                                                                                                                            Patient Demographics   Lydia Clark, is a 74 y.o. female, DOB - [birth year], YIA:[id number]  Admit date - 01/09/2015   Admitting Physician Claud Kelp, MD  Outpatient Primary MD for the patient is HAWKINS,EDWARD L, MD   LOS - 2  Subjective: Patient was able to tolerate some physical therapy earlier today. Denies any chest pain or shortness of breath     Review of Systems:   CONSTITUTIONAL: No documented fever. No fatigue, weakness. No weight gain, no weight loss.  EYES: No blurry or double vision.  ENT: No tinnitus. No postnasal drip. No redness of the oropharynx.  RESPIRATORY: No cough, no wheeze, no hemoptysis. No dyspnea.  CARDIOVASCULAR: No chest pain. No orthopnea. No palpitations. No syncope.  GASTROINTESTINAL: No nausea, no vomiting or diarrhea. No abdominal pain. No melena or hematochezia.  GENITOURINARY: No dysuria or hematuria.  ENDOCRINE: No polyuria or nocturia. No heat or cold intolerance.  HEMATOLOGY: No anemia. No bruising. No bleeding.  INTEGUMENTARY: No rashes. No lesions.  MUSCULOSKELETAL: No arthritis. No swelling. No gout. + hip pain NEUROLOGIC: No numbness, tingling, or ataxia. No seizure-type activity.  PSYCHIATRIC: No anxiety. No insomnia. No ADD.    Vitals:   Filed Vitals:   01/10/15 2044 01/11/15 0537 01/11/15 0610 01/11/15 0835  BP: 140/71 134/59  130/57  Pulse: 86 95  95  Temp: 98.3 F (36.8 C) 98.9 F (37.2 C)  98.6 F (37 C)  TempSrc: Oral Oral  Axillary  Resp: 18 18    Height:      Weight:  63.05 kg (139 lb)    SpO2: 94% 82% 100% 92%    Wt Readings from Last 3 Encounters:  01/11/15 63.05 kg (139 lb)  07/22/14 59.421 kg (131 lb)  06/16/14 58.968 kg (130 lb)     Intake/Output Summary (Last  24 hours) at 01/11/15 1359 Last data filed at 01/11/15 0830  Gross per 24 hour  Intake      0 ml  Output    906 ml  Net   -906 ml    Physical Exam:   GENERAL: Pleasant-appearing in no apparent distress.  HEAD, EYES, EARS, NOSE AND THROAT: Atraumatic, normocephalic. Extraocular muscles are intact. Pupils equal and reactive to light. Sclerae anicteric. No conjunctival injection. No oro-pharyngeal erythema.  NECK: Supple. There is no jugular venous distention. No bruits, no lymphadenopathy,  no thyromegaly.  HEART: Regular rate and rhythm,. No murmurs, no rubs, no clicks.  LUNGS: Clear to auscultation bilaterally. No rales or rhonchi. No wheezes.  ABDOMEN: Soft, flat, nontender, nondistended. Has good bowel sounds. No hepatosplenomegaly appreciated.  EXTREMITIES: No evidence of any cyanosis, clubbing, or peripheral edema.  +2 pedal and radial pulses bilaterally.  NEUROLOGIC: The patient is alert, awake, and oriented x3 with no focal motor or sensory deficits appreciated bilaterally.  SKIN: Moist and warm with no rashes appreciated.  Psych: Not anxious, depressed LN: No inguinal LN enlargement    Antibiotics   Anti-infectives    Start     Dose/Rate Route Frequency Ordered Stop   01/10/15 1400  ceFAZolin (ANCEF) IVPB 1 g/50 mL premix     1 g 100 mL/hr over 30 Minutes Intravenous 3 times per day 01/10/15 1326 01/11/15 0552      Medications   Scheduled Meds: . aspirin EC  81 mg Oral Daily  . cholecalciferol  1,000 Units Oral Daily  . dialysis solution 2.5% low-MG/low-CA   Intraperitoneal Q24H  . furosemide  40 mg Oral Daily  . gentamicin ointment   Topical Daily  . heparin subcutaneous  5,000 Units Subcutaneous Q12H  . irbesartan  37.5 mg Oral Daily  . loratadine  10 mg Oral Daily  . mirtazapine  15 mg Oral QHS  . mometasone-formoterol  2 puff Inhalation BID  . nicotine  14 mg Transdermal Daily  . pantoprazole  40 mg Oral Daily  . rosuvastatin  10 mg Oral QPM  . sodium  chloride  3 mL Intravenous Q12H  . tiotropium  18 mcg Inhalation QHS   Continuous Infusions:  PRN Meds:.sodium chloride, acetaminophen **OR** acetaminophen, albuterol, fentaNYL (SUBLIMAZE) injection, heparin, lactulose, LORazepam, morphine injection, morphine injection, ondansetron (ZOFRAN) IV, oxyCODONE, sodium chloride, traMADol   Data Review:   Micro Results No results found for this or any previous visit (from the past 240 hour(s)).  Radiology Reports Dg Chest 1 View  01/09/2015  CLINICAL DATA:  74 year old female with history of left hip fracture. Preoperative study. EXAM: CHEST 1 VIEW COMPARISON:  Chest x-ray 04/07/2014. FINDINGS: Emphysematous changes throughout the lungs bilaterally. No acute consolidative airspace disease. No pleural effusions. No definite suspicious appearing pulmonary nodules or masses. No evidence of pulmonary edema. Heart size is upper limits of normal. Upper mediastinal contours are within normal limits. Atherosclerosis in the thoracic aorta. IMPRESSION: 1. No radiographic evidence of acute cardiopulmonary disease. 2. Emphysema. 3. Atherosclerosis. Electronically Signed   By: Vinnie Langton M.D.   On: 01/09/2015 18:00   Dg Pelvis 1-2 Views  01/10/2015  CLINICAL DATA:  74 year old female status post left femur fracture repair. Initial encounter. EXAM: PELVIS - 1-2 VIEW COMPARISON:  Lennie Odor 07/2014. FINDINGS: AP view of the pelvis at 1141 hours. Proximal left femur arthroplasty now in place. The femoral head component appears normally aligned with the acetabulum on this AP view. Postoperative changes to the surrounding soft tissues including subcutaneous gas, overlying skin staples. Other visualized osseous structures appear stable. IMPRESSION: Left proximal femoral arthroplasty with no adverse features identified. Electronically Signed   By: Genevie Ann M.D.   On: 01/10/2015 12:59   Ct Head Wo Contrast  01/09/2015  CLINICAL DATA:  Multiple falls, bruise along the  temporal area EXAM: CT HEAD WITHOUT CONTRAST TECHNIQUE: Contiguous axial images were obtained from the base of the skull through the vertex without intravenous contrast. COMPARISON:  None. FINDINGS: There is no evidence of mass effect, midline shift, or  extra-axial fluid collections. There is no evidence of a space-occupying lesion or intracranial hemorrhage. There is no evidence of a cortical-based area of acute infarction. There is generalized cerebral atrophy. There is periventricular white matter low attenuation likely secondary to microangiopathy. The ventricles and sulci are appropriate for the patient's age. The basal cisterns are patent. Visualized portions of the orbits are unremarkable. The visualized portions of the paranasal sinuses and mastoid air cells are unremarkable. Cerebrovascular atherosclerotic calcifications are noted. The osseous structures are unremarkable. IMPRESSION: 1. No acute intracranial pathology. 2. Chronic microvascular disease and cerebral atrophy. 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 EXAM: DG HIP (WITH OR WITHOUT PELVIS) 2-3V LEFT COMPARISON:  No priors. FINDINGS: Three views of the bony pelvis and left hip demonstrate an acute transcervical left femoral neck fracture which appears mildly comminuted. There is approximately 1.6 cm of proximal migration of the femur relative to the proximal fragment. Femoral head remains located. Small adjacent bony fragment immediately superior and lateral to the femoral head also noted. Bony pelvis itself appears intact, as does the visualized portions of the right proximal femur. IMPRESSION: 1. Acute displaced minimally comminuted transcervical left femoral neck fracture, as above. Electronically Signed   By: Vinnie Langton M.D.   On: 01/09/2015 17:42     CBC  Recent Labs Lab 01/09/15 1731 01/11/15 0332  WBC 8.5 8.5  HGB 13.0 11.8*  HCT 38.7 36.1  PLT 228 252  MCV 95.0 94.3  MCH 31.8 30.9  MCHC 33.5 32.8  RDW 15.8* 15.4*    Chemistries   Recent Labs Lab 01/09/15 1731 01/11/15 0332  NA 135 135  K 3.9 4.2  CL 96* 101  CO2 29 27  GLUCOSE 92 117*  BUN 49* 50*  CREATININE 6.81* 6.85*  CALCIUM 8.9 8.3*  AST 30  --   ALT 17  --   ALKPHOS 71  --   BILITOT 0.2*  --    ------------------------------------------------------------------------------------------------------------------ estimated creatinine clearance is 7.2 mL/min (by C-G formula based on Cr of 6.85). ------------------------------------------------------------------------------------------------------------------ No results for input(s): HGBA1C in the last 72 hours. ------------------------------------------------------------------------------------------------------------------ No results for input(s): CHOL, HDL, LDLCALC, TRIG, CHOLHDL, LDLDIRECT in the last 72 hours. ------------------------------------------------------------------------------------------------------------------ No results for input(s): TSH, T4TOTAL, T3FREE, THYROIDAB in the last 72 hours.  Invalid input(s): FREET3 ------------------------------------------------------------------------------------------------------------------ No results for input(s): VITAMINB12, FOLATE, FERRITIN, TIBC, IRON, RETICCTPCT in the last 72 hours.  Coagulation profile  Recent Labs Lab 01/09/15 1731  INR 0.85    No results for input(s): DDIMER in the last 72 hours.  Cardiac Enzymes No results for input(s): CKMB, TROPONINI, MYOGLOBIN in the last 168 hours.  Invalid input(s): CK ------------------------------------------------------------------------------------------------------------------ Invalid input(s): POCBNP    Assessment & Plan   Active  Problems: 1.   Acute left femoral neck fracture  s/p repair, continue physical therapy  2. End-stage renal disease on peritoneal dialysis Pd t   3. Essential hypertension Continue  Avapro, and lasix   4. Chronic history of COPD no exacerbation Continue inhalers  As taking at home  5. Tobacco abuse disorder Counseled  regarding smoking cessation  6. hyperlipdemia continue simvastatin  7. Misc: dvt prop heparin     Code Status Orders        Start  Ordered   01/09/15 2043  Full code   Continuous     01/09/15 2042    Advance Directive Documentation        Most Recent Value   Type of Advance Directive  Living will   Pre-existing out of facility DNR order (yellow form or pink MOST form)     "MOST" Form in Place?             Consults  Ortho, nephrology   DVT Prophylaxis    Heparin   Lab Results  Component Value Date   PLT 252 01/11/2015     Time Spent in minutes    76min.   Dustin Flock M.D on 01/11/2015 at 1:59 PM  Between 7am to 6pm - Pager - [phone]  After 6pm go to [email or web address] - password [email or web address] [facility]   Office  [phone]

## 2015-01-11 NOTE — Progress Notes (Signed)
o2 sat up to 100% with rebreather mask spoke to RT  To con firm objective will continue with o2 cannula

## 2015-01-11 NOTE — Progress Notes (Signed)
Physical Therapy Treatment Patient Details Name: Lydia Clark MRN: [id number] DOB: 08/08/1940 Today's Date: 01/11/2015    History of Present Illness Lydia Clark is a 74 y.o. female with a known history of hypertension, end-stage renal disease on peritoneal dialysis, chronic COPD and continues to smoke is presenting to the ED after she sustained a fall. X-ray has revealed left femoral neck fracture and CT head is negative for any acute abnormalities. Patient is s/p left hip hemiarthroplasty. She is on posterior hip precautions at this time.     PT Comments    Pt's balance much improved this afternoon leading to higher quality transfers and minor ambulation. Pt still has weakness and activity tolerance deficits, evident by her fatiguing during ambulation. All mobility was performed on 3L O2 [place] and pt's SaO2 never fell below 92% during or after mobility assessment. Pt requires encouragement as she tends to downplay her ability. She will continue to benefit from skilled PT in order to address her strength and endurance deficits so that she can eventually return home and to optimal PLOF.   Follow Up Recommendations  SNF     Equipment Recommendations  Rolling walker with 5" wheels    Recommendations for Other Services       Precautions / Restrictions Precautions Precautions: Fall Precaution Comments: Precaution packet issued Restrictions Weight Bearing Restrictions: Yes LLE Weight Bearing: Weight bearing as tolerated    Mobility  Bed Mobility Overal bed mobility: Needs Assistance Bed Mobility: Supine to Sit     Supine to sit: Min assist Sit to supine: Mod assist;+2 for physical assistance   General bed mobility comments: Pt requiring some assist for LEs, but largely performed much better this afternoon with using hands to help assist her trunk  Transfers Overall transfer level: Needs assistance Equipment used: Rolling walker (2 wheeled) Transfers: Sit to/from Stand Sit to  Stand: Min assist         General transfer comment: Pt much improved with transfer ability this afternoon. She does not require as much assist from therapist and is able to reach terminal knee extension  Ambulation/Gait Ambulation/Gait assistance: Min assist Ambulation Distance (Feet): 4 Feet Assistive device: Rolling walker (2 wheeled) Gait Pattern/deviations: Step-to pattern;Decreased step length - right;Decreased step length - left;Decreased stride length;Antalgic Gait velocity: greatly decreased Gait velocity interpretation: <1.8 ft/sec, indicative of risk for recurrent falls General Gait Details: Pt able to perform minimal ambulation in order to sit in recliner. She performs this steps with better effort than this morning. She requires assist for tactile cues to step as well as minor weight shifting to facilitate swing through. No LOB noted, however she fatigues quickly and needs to sit rather rapidly after onset of fatigue. Fatigues after about 1 minute of standing   Stairs            Wheelchair Mobility    Modified Rankin (Stroke Patients Only)       Balance Overall balance assessment: Needs assistance Sitting-balance support: Bilateral upper extremity supported Sitting balance-Leahy Scale: Fair Sitting balance - Comments: Pt only able to hold sitting balance for 10 seconds before listing backwards   Standing balance support: Bilateral upper extremity supported Standing balance-Leahy Scale: Fair Standing balance comment: Posterior drift                    Cognition Arousal/Alertness: Awake/alert Behavior During Therapy: WFL for tasks assessed/performed Overall Cognitive Status: Within Functional Limits for tasks assessed  Exercises Other Exercises Other Exercises: Pt performed bilateral therex x 12 reps at min assist for facilitation of movement. Exercises performed: ankle pumps, quad sets, SAQ, SLR, and hip ABD. Pt not  showing too much pain during therex Other Exercises: Pt practiced 3 sit-to-stands with PT before fatiguing. She was provided ample rest inbetween sets of standing for 30 seconds. All mobility was performed on 3L O2 [place]. Pt SaO2 never fell below 90% during or after mobility.     General Comments        Pertinent Vitals/Pain Pain Assessment: 0-10 Pain Score: 3  Pain Location: L hip  Pain Intervention(s): Limited activity within patient's tolerance;Monitored during session    Home Living                      Prior Function            PT Goals (current goals can now be found in the care plan section) Acute Rehab PT Goals Patient Stated Goal: To try to get to the chair PT Goal Formulation: With patient/family Time For Goal Achievement: 01/24/15 Potential to Achieve Goals: Fair Progress towards PT goals: Progressing toward goals    Frequency  BID    PT Plan Current plan remains appropriate    Co-evaluation             End of Session Equipment Utilized During Treatment: Gait belt Activity Tolerance: Patient tolerated treatment well Patient left: with call bell/phone within reach;with SCD's reapplied;with family/visitor present;in chair;with chair alarm set     Time: 3254-9826 PT Time Calculation (min) (ACUTE ONLY): 25 min  Charges:  $Therapeutic Exercise: 8-22 mins $Therapeutic Activity: 8-22 mins                    G CodesJanyth Clark 2015-01-25, 3:40 PM  Lydia Clark, SPT. [phone]

## 2015-01-11 NOTE — NC FL2 (Signed)
[place] LEVEL OF CARE SCREENING TOOL     IDENTIFICATION  Patient Name: Lydia Clark Birthdate: [birth year] Sex: female Admission Date (Current Location): 01/09/2015  [place] and [place] Number:  [facility] )   Facility and Address:  [facility], [address] [place], [place] [address]      Provider Number: [id number] [phone])  Attending Physician Name and Address:  Dustin Flock, MD  Relative Name and Phone Number:       Current Level of Care: Hospital Recommended Level of Care: [place] Prior Approval Number:    Date Approved/Denied:   PASRR Number:  [id number])  Discharge Plan: SNF    Current Diagnoses: Patient Active Problem List   Diagnosis Date Noted  . Femoral neck fracture, left, closed, initial encounter 01/09/2015  . Cough 04/01/2014  . COPD, mild ([place]) 04/01/2014  . Tobacco use disorder 04/01/2014  . History of breast cancer, left Stage 1 invasive, ER/PR p[os, Her 2 neg 07/23/2012    Orientation ACTIVITIES/SOCIAL BLADDER RESPIRATION    Self, Time, Situation, Place  Active Continent O2 (As needed) (oxygen 3 liters )  BEHAVIORAL SYMPTOMS/MOOD NEUROLOGICAL BOWEL NUTRITION STATUS   (none )  (none ) Continent Diet (Renal Diet)  PHYSICIAN VISITS COMMUNICATION OF NEEDS Height & Weight Skin  30 days Verbally 5\' 7"  (170.2 cm) 139 lbs. Surgical wounds (Incision: Left Hip. )          AMBULATORY STATUS RESPIRATION    Assist extensive O2 (As needed) (oxygen 3 liters )      Personal Care Assistance Level of Assistance  Bathing, Feeding, Dressing Bathing Assistance: Limited assistance Feeding assistance: Independent Dressing Assistance: Limited assistance      Functional Limitations Info  Sight, Hearing, Speech Sight Info: Adequate Hearing Info: Adequate Speech Info: Adequate       SPECIAL CARE FACTORS FREQUENCY  PT (By licensed PT), OT (By licensed OT)     PT Frequency:   (5) OT Frequency:  (5)           Additional Factors Info  Code Status, Allergies Code Status Info:  (Full Code. ) Allergies Info:  (Bee Venom, Biaxin Clarithromycin, Mycostatin Nystatin, Polysorbate, Sulfa Antibiotics)           Current Medications (01/11/2015): Current Facility-Administered Medications  Medication Dose Route Frequency Provider Last Rate Last Dose  . 0.9 %  sodium chloride infusion  250 mL Intravenous PRN Nicholes Mango, MD      . acetaminophen (TYLENOL) tablet 650 mg  650 mg Oral Q6H PRN Nicholes Mango, MD       Or  . acetaminophen (TYLENOL) suppository 650 mg  650 mg Rectal Q6H PRN Aruna Gouru, MD      . albuterol (PROVENTIL) (2.5 MG/3ML) 0.083% nebulizer solution 2.5 mg  2.5 mg Nebulization Q6H PRN Nicholes Mango, MD      . aspirin EC tablet 81 mg  81 mg Oral Daily Nicholes Mango, MD   81 mg at 01/11/15 1217  . cholecalciferol (VITAMIN D) tablet 1,000 Units  1,000 Units Oral Daily Nicholes Mango, MD   1,000 Units at 01/11/15 1217  . dialysis solution 2.5% low-MG/low-CA dianeal solution   Intraperitoneal Q24H Harmeet Singh, MD      . fentaNYL (SUBLIMAZE) injection 25-50 mcg  25-50 mcg Intravenous Q5 min PRN Andria Frames, MD      . furosemide (LASIX) tablet 40 mg  40 mg Oral Daily Nicholes Mango, MD   40 mg at 01/11/15 1218  .  gentamicin ointment (GARAMYCIN) 0.1 %   Topical Daily Harmeet Singh, MD      . heparin 1000 unit/ml injection 3,000 Units  3,000 Units Intraperitoneal PRN Murlean Iba, MD      . heparin injection 5,000 Units  5,000 Units Subcutaneous Q12H Claud Kelp, MD   5,000 Units at 01/11/15 1216  . irbesartan (AVAPRO) tablet 37.5 mg  37.5 mg Oral Daily Nicholes Mango, MD   37.5 mg at 01/11/15 1217  . lactulose (CHRONULAC) 10 GM/15ML solution 20 g  20 g Oral BID PRN Nicholes Mango, MD      . loratadine (CLARITIN) tablet 10 mg  10 mg Oral Daily Nicholes Mango, MD   10 mg at 01/11/15 1217  . LORazepam (ATIVAN) tablet 1 mg  1 mg Oral QHS PRN Nicholes Mango, MD   1 mg at  01/09/15 2149  . mirtazapine (REMERON) tablet 15 mg  15 mg Oral QHS Nicholes Mango, MD   15 mg at 01/10/15 2112  . mometasone-formoterol (DULERA) 100-5 MCG/ACT inhaler 2 puff  2 puff Inhalation BID Nicholes Mango, MD   2 puff at 01/10/15 2120  . morphine 2 MG/ML injection 2 mg  2 mg Intravenous Q4H PRN Nicholes Mango, MD   2 mg at 01/11/15 0522  . morphine 4 MG/ML injection 4 mg  4 mg Intravenous Q4H PRN Nicholes Mango, MD   4 mg at 01/10/15 1418  . nicotine (NICODERM CQ - dosed in mg/24 hours) patch 14 mg  14 mg Transdermal Daily Nicholes Mango, MD   14 mg at 01/11/15 1216  . ondansetron (ZOFRAN) injection 4 mg  4 mg Intravenous Q6H PRN Harrie Foreman, MD      . oxyCODONE (Oxy IR/ROXICODONE) immediate release tablet 5-10 mg  5-10 mg Oral Q3H PRN Lattie Corns, PA-C      . pantoprazole (PROTONIX) EC tablet 40 mg  40 mg Oral Daily Nicholes Mango, MD   40 mg at 01/11/15 1217  . rosuvastatin (CRESTOR) tablet 10 mg  10 mg Oral QPM Nicholes Mango, MD   10 mg at 01/10/15 2112  . sodium chloride 0.9 % injection 3 mL  3 mL Intravenous Q12H Nicholes Mango, MD   3 mL at 01/11/15 1218  . sodium chloride 0.9 % injection 3 mL  3 mL Intravenous PRN Nicholes Mango, MD      . tiotropium (SPIRIVA) inhalation capsule 18 mcg  18 mcg Inhalation QHS Nicholes Mango, MD   18 mcg at 01/10/15 2112  . traMADol (ULTRAM) tablet 50 mg  50 mg Oral Q12H PRN Lattie Corns, PA-C   50 mg at 01/11/15 1504   Do not use this list as official medication orders. Please verify with discharge summary.  Discharge Medications:   Medication List    ASK your doctor about these medications        ADVAIR DISKUS 250-50 MCG/DOSE Aepb  Generic drug:  Fluticasone-Salmeterol  Inhale 1 puff into the lungs every 12 (twelve) hours.     albuterol 108 (90 BASE) MCG/ACT inhaler  Commonly known as:  PROVENTIL HFA;VENTOLIN HFA  Inhale 2 puffs into the lungs every 6 (six) hours as needed for wheezing or shortness of breath.     aspirin 81 MG tablet  Take 81 mg  by mouth daily.     fexofenadine 60 MG tablet  Commonly known as:  ALLEGRA  Take 60 mg by mouth 2 (two) times daily.     furosemide 40 MG tablet  Commonly known  as:  LASIX  Take 40 mg by mouth daily.     gentamicin ointment 0.1 %  Commonly known as:  GARAMYCIN  Apply 1 application topically at bedtime.     LORazepam 1 MG tablet  Commonly known as:  ATIVAN  Take 1 mg by mouth at bedtime as needed for anxiety.     mirtazapine 15 MG tablet  Commonly known as:  REMERON  Take 15 mg by mouth at bedtime.     omeprazole 20 MG capsule  Commonly known as:  PRILOSEC  Take 20 mg by mouth daily.     rosuvastatin 10 MG tablet  Commonly known as:  CRESTOR  Take 10 mg by mouth every evening.     tiotropium 18 MCG inhalation capsule  Commonly known as:  SPIRIVA  Place 18 mcg into inhaler and inhale at bedtime.     valsartan 160 MG tablet  Commonly known as:  DIOVAN  Take 160 mg by mouth daily.     Vitamin D3 2000 UNITS Tabs  Take 1 tablet by mouth daily.        Relevant Imaging Results:  Relevant Lab Results:  Recent Labs    Additional Information  (Patient will be on Hemodialysis. at [facility] in [place] or [place]. [address] SSN: [id number])  Loralyn Freshwater, LCSW

## 2015-01-11 NOTE — Care Management Note (Signed)
Case Management Note  Patient Details  Name: Lydia Clark MRN: [id number] Date of Birth: October 30, 1940  Subjective/Objective:                  Met with patient and her son to discuss discharge planning. PT is now recommending SNF. She is peritoneal dialysis at home. She has been living independently at home. She has a rolling walker and a handicap bathroom that she had remodeled. She uses [facility] for Rx on [address]. She is not on home O2.   Action/Plan: List of home health care agencies left with patient. Patient and son agree with SNF.   Expected Discharge Date:                  Expected Discharge Plan:     In-House Referral:     Discharge planning Services  CM Consult  Post Acute Care Choice:  Home Health Choice offered to:  Patient  DME Arranged:    DME Agency:     HH Arranged:    [place] Agency:     Status of Service:  In process, will continue to follow  Medicare Important Message Given:  Yes-second notification given Date Medicare IM Given:    Medicare IM give by:    Date Additional Medicare IM Given:    Additional Medicare Important Message give by:     If discussed at [place] of Stay Meetings, dates discussed:    Additional Comments:  Marshell Garfinkel, RN 01/11/2015, 10:48 AM

## 2015-01-11 NOTE — Progress Notes (Signed)
[facility]  ROUNDING NOTE   Subjective:   Admitted to [facility] for left hip fracture. Dr. Daine Gip did hemiarthroplasty on 11/6.  Peritoneal dialysis last night. UF of 949mL.   Objective:  Vital signs in last 24 hours:  Temp:  [97.8 F (36.6 C)-98.9 F (37.2 C)] 98.6 F (37 C) (11/07 0835) Pulse Rate:  [67-95] 95 (11/07 0835) Resp:  [15-18] 18 (11/07 0537) BP: (118-140)/(57-76) 130/57 mmHg (11/07 0835) SpO2:  [82 %-100 %] 92 % (11/07 0835) FiO2 (%):  [15 %] 15 % (11/07 0610) Weight:  [63.05 kg (139 lb)] 63.05 kg (139 lb) (11/07 0537)  Weight change: 7.711 kg (17 lb) Filed Weights   01/09/15 1651 01/09/15 2031 01/11/15 0537  Weight: 55.339 kg (122 lb) 57.108 kg (125 lb 14.4 oz) 63.05 kg (139 lb)    Intake/Output: I/O last 3 completed shifts: In: 750 [I.V.:750] Out: 250 [Blood:250]   Intake/Output this shift:  Total I/O In: -  Out: 906 [Other:906]  Physical Exam: General: NAD  Head: Normocephalic, atraumatic. Moist oral mucosal membranes  Eyes: Anicteric, PERRL  Neck: Supple, trachea midline  Lungs:  Clear to auscultation  Heart: Regular rate and rhythm  Abdomen:  Soft, nontender  Extremities: no peripheral edema  Neurologic: confused  Skin: No lesions  Access PD catheter    Basic Metabolic Panel:  Recent Labs Lab 01/09/15 1731 01/11/15 0332  NA 135 135  K 3.9 4.2  CL 96* 101  CO2 29 27  GLUCOSE 92 117*  BUN 49* 50*  CREATININE 6.81* 6.85*  CALCIUM 8.9 8.3*    Liver Function Tests:  Recent Labs Lab 01/09/15 1731  AST 30  ALT 17  ALKPHOS 71  BILITOT 0.2*  PROT 6.5  ALBUMIN 2.9*   No results for input(s): LIPASE, AMYLASE in the last 168 hours. No results for input(s): AMMONIA in the last 168 hours.  CBC:  Recent Labs Lab 01/09/15 1731 01/11/15 0332  WBC 8.5 8.5  HGB 13.0 11.8*  HCT 38.7 36.1  MCV 95.0 94.3  PLT 228 252    Cardiac Enzymes: No results for input(s): CKTOTAL, CKMB, CKMBINDEX, TROPONINI in the last 168  hours.  BNP: Invalid input(s): POCBNP  CBG: No results for input(s): GLUCAP in the last 168 hours.  Microbiology: Results for orders placed or performed during the hospital encounter of 07/01/12  Surgical pcr screen     Status: None   Collection Time: 07/01/12 11:37 AM  Result Value Ref Range Status   MRSA, PCR NEGATIVE NEGATIVE Final   Staphylococcus aureus NEGATIVE NEGATIVE Final    Comment:        The Xpert SA Assay (FDA approved for NASAL specimens in patients over 95 years of age), is one component of a comprehensive surveillance program.  Test performance has been validated by [employer] for patients greater than or equal to 35 year old. It is not intended to diagnose infection nor to guide or monitor treatment.    Coagulation Studies:  Recent Labs  01/09/15 1731  LABPROT 11.8  INR 0.85    Urinalysis: No results for input(s): COLORURINE, LABSPEC, PHURINE, GLUCOSEU, HGBUR, BILIRUBINUR, KETONESUR, PROTEINUR, UROBILINOGEN, NITRITE, LEUKOCYTESUR in the last 72 hours.  Invalid input(s): APPERANCEUR    Imaging: Dg Chest 1 View  01/09/2015  CLINICAL DATA:  74 year old female with history of left hip fracture. Preoperative study. EXAM: CHEST 1 VIEW COMPARISON:  Chest x-ray 04/07/2014. FINDINGS: Emphysematous changes throughout the lungs bilaterally. No acute consolidative airspace disease. No pleural effusions.  No definite suspicious appearing pulmonary nodules or masses. No evidence of pulmonary edema. Heart size is upper limits of normal. Upper mediastinal contours are within normal limits. Atherosclerosis in the thoracic aorta. IMPRESSION: 1. No radiographic evidence of acute cardiopulmonary disease. 2. Emphysema. 3. Atherosclerosis. Electronically Signed   By: Vinnie Langton M.D.   On: 01/09/2015 18:00   Dg Pelvis 1-2 Views  01/10/2015  CLINICAL DATA:  74 year old female status post left femur fracture repair. Initial encounter. EXAM: PELVIS - 1-2 VIEW  COMPARISON:  Lennie Odor 07/2014. FINDINGS: AP view of the pelvis at 1141 hours. Proximal left femur arthroplasty now in place. The femoral head component appears normally aligned with the acetabulum on this AP view. Postoperative changes to the surrounding soft tissues including subcutaneous gas, overlying skin staples. Other visualized osseous structures appear stable. IMPRESSION: Left proximal femoral arthroplasty with no adverse features identified. Electronically Signed   By: Genevie Ann M.D.   On: 01/10/2015 12:59   Ct Head Wo Contrast  01/09/2015  CLINICAL DATA:  Multiple falls, bruise along the temporal area EXAM: CT HEAD WITHOUT CONTRAST TECHNIQUE: Contiguous axial images were obtained from the base of the skull through the vertex without intravenous contrast. COMPARISON:  None. FINDINGS: There is no evidence of mass effect, midline shift, or extra-axial fluid collections. There is no evidence of a space-occupying lesion or intracranial hemorrhage. There is no evidence of a cortical-based area of acute infarction. There is generalized cerebral atrophy. There is periventricular white matter low attenuation likely secondary to microangiopathy. The ventricles and sulci are appropriate for the patient's age. The basal cisterns are patent. Visualized portions of the orbits are unremarkable. The visualized portions of the paranasal sinuses and mastoid air cells are unremarkable. Cerebrovascular atherosclerotic calcifications are noted. The osseous structures are unremarkable. IMPRESSION: 1. No acute intracranial pathology. 2. Chronic microvascular disease and cerebral atrophy. 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 EXAM: DG HIP (WITH OR WITHOUT PELVIS) 2-3V LEFT COMPARISON:  No priors. FINDINGS:  Three views of the bony pelvis and left hip demonstrate an acute transcervical left femoral neck fracture which appears mildly comminuted. There is approximately 1.6 cm of proximal migration of the femur relative to the proximal fragment. Femoral head remains located. Small adjacent bony fragment immediately superior and lateral to the femoral head also noted. Bony pelvis itself appears intact, as does the visualized portions of the right proximal femur. IMPRESSION: 1. Acute displaced minimally comminuted transcervical left femoral neck fracture, as above. Electronically Signed   By: Vinnie Langton M.D.   On: 01/09/2015 17:42     Medications:     . aspirin EC  81 mg Oral Daily  . cholecalciferol  1,000 Units Oral Daily  . dialysis solution 2.5% low-MG/low-CA   Intraperitoneal Q24H  . furosemide  40 mg Oral Daily  . gentamicin ointment   Topical Daily  . heparin subcutaneous  5,000 Units Subcutaneous Q12H  . irbesartan  37.5 mg Oral Daily  . loratadine  10 mg Oral Daily  . mirtazapine  15 mg Oral QHS  . mometasone-formoterol  2 puff Inhalation BID  . nicotine  14 mg Transdermal Daily  . pantoprazole  40 mg Oral Daily  . rosuvastatin  10 mg Oral QPM  . sodium  chloride  3 mL Intravenous Q12H  . tiotropium  18 mcg Inhalation QHS   sodium chloride, acetaminophen **OR** acetaminophen, albuterol, fentaNYL (SUBLIMAZE) injection, heparin, lactulose, LORazepam, morphine injection, morphine injection, ondansetron (ZOFRAN) IV, oxyCODONE, sodium chloride, traMADol  Assessment/ Plan:  Ms. Lydia Clark is a 74 y.o. female withe renal disease on peritoneal dialysis, hypertension, COPD, GERD, hyperlipidemia, generalized anxiety disorder, history depression, anemia of chronic kidney disease, secondary hyperparathyroidism and osteopenia.  [place] PD   1. End Stage Renal Disease: tolerating PD well. However if patient is to go for SNF, she will need hemodialysis until out of rehab. She currently does not  have HD access and will need a tunnelled catheter.  - Continue PD tonight.   2. Anemia of chronic kidney disease: hemoglobin at goal. 11.8 - Continue venofer and epo per protocol as outpatient. No indication for epo currently.   3. Secondary Hyperparathyroidism: calcium and phosphorus at goal. PTH at goal as outpatient.  - Calcitriol as an outpatient, will restart.  - No binders.   4. Hypertension: not at goal.  - home regimen of valsartan and furosemide.  - irbesartan ordered here.   5. Nutrition: albumin 2.9 -  mirtazapine     LOS: [place], Loucinda Croy 11/7/201612:34 PM

## 2015-01-11 NOTE — Clinical Social Work Placement (Signed)
   CLINICAL SOCIAL WORK PLACEMENT  NOTE  Date:  01/11/2015  Patient Details  Name: BEOLA VASALLO MRN: [id number] Date of Birth: 17-Dec-1940  Clinical Social Work is seeking post-discharge placement for this patient at the [place] level of care (*CSW will initial, date and re-position this form in  chart as items are completed):  Yes   Patient/family provided with [place] Work Department's list of facilities offering this level of care within the geographic area requested by the patient (or if unable, by the patient's family).  Yes   Patient/family informed of their freedom to choose among providers that offer the needed level of care, that participate in Medicare, Medicaid or managed care program needed by the patient, have an available bed and are willing to accept the patient.  Yes   Patient/family informed of [facility]'s ownership interest in [facility] and [facility], as well as of the fact that they are under no obligation to receive care at these facilities.  PASRR submitted to EDS on 01/11/15     PASRR number received on 01/11/15     Existing PASRR number confirmed on       FL2 transmitted to all facilities in geographic area requested by pt/family on 01/11/15     FL2 transmitted to all facilities within larger geographic area on       Patient informed that his/her managed care company has contracts with or will negotiate with certain facilities, including the following:            Patient/family informed of bed offers received.  Patient chooses bed at       Physician recommends and patient chooses bed at      Patient to be transferred to   on  .  Patient to be transferred to facility by       Patient family notified on   of transfer.  Name of family member notified:        PHYSICIAN Please sign FL2     Additional Comment:    _______________________________________________ Loralyn Freshwater, LCSW 01/11/2015,  3:43 PM

## 2015-01-11 NOTE — Progress Notes (Signed)
Physical Therapy Treatment Patient Details Name: Lydia Clark MRN: [id number] DOB: September 15, 1940 Today's Date: 01/11/2015    History of Present Illness Lydia Clark is a 74 y.o. female with a known history of hypertension, end-stage renal disease on peritoneal dialysis, chronic COPD and continues to smoke is presenting to the ED after she sustained a fall. X-ray has revealed left femoral neck fracture and CT head is negative for any acute abnormalities. Patient is s/p left hip hemiarthroplasty. She is on posterior hip precautions at this time.     PT Comments    Pt progressing towards goals, but still shows significant weakness with attempts at transfers. Repetitions of transfer training performed today as well as increased therex volume. She appears very motivated to participate in therapy tasks. Due to her mobility and strength deficits she will continue to benefit from skilled PT in order for her to return to premorbid level of function. STR is the safest option for her at this time that will provide her the ability to safely return to PLOF.   Follow Up Recommendations  SNF     Equipment Recommendations  Rolling walker with 5" wheels    Recommendations for Other Services       Precautions / Restrictions Precautions Precautions: Fall Precaution Comments: Precaution packet issued Restrictions Weight Bearing Restrictions: Yes LLE Weight Bearing: Weight bearing as tolerated    Mobility  Bed Mobility Overal bed mobility: Needs Assistance Bed Mobility: Supine to Sit;Sit to Supine     Supine to sit: Mod assist Sit to supine: Mod assist;+2 for physical assistance   General bed mobility comments: Pt requires assist for trunk getting into sitting. She also needs assist for LLE management in order to maintain hip precautions.   Transfers Overall transfer level: Needs assistance Equipment used: Rolling walker (2 wheeled) Transfers: Sit to/from Stand Sit to Stand: Mod assist          General transfer comment: Pt needs assist to get from EOB into standing. She is not able to fully get into standing as she continues to have bent legs that don't find terminal extension. She also does not push her hips all the way forward.   Ambulation/Gait Ambulation/Gait assistance:  (Not appropriate)               Stairs            Wheelchair Mobility    Modified Rankin (Stroke Patients Only)       Balance Overall balance assessment: Needs assistance Sitting-balance support: Bilateral upper extremity supported Sitting balance-Leahy Scale: Poor Sitting balance - Comments: Pt only able to hold sitting balance for 10 seconds before listing backwards   Standing balance support: Bilateral upper extremity supported Standing balance-Leahy Scale: Poor Standing balance comment: Posterior drift                    Cognition Arousal/Alertness: Awake/alert Behavior During Therapy: WFL for tasks assessed/performed Overall Cognitive Status: Within Functional Limits for tasks assessed                      Exercises Other Exercises Other Exercises: Pt performed bilateral therex x 12 reps at min assist for facilitation of movement. Exercises performed: ankle pumps, quad sets, SAQ, SLR, and hip ABD. Pt not showing too much pain during therex Other Exercises: Pt practiced 3 sit-to-stands with PT before fatiguing. She was provided ample rest inbetween sets of standing for 30 seconds. All mobility was performed on 3L O2  [place]. Pt SaO2 never fell below 90% during or after mobility.     General Comments        Pertinent Vitals/Pain Pain Assessment: No/denies pain Pain Score: 2  Pain Location: L buttock area Pain Intervention(s): Limited activity within patient's tolerance;Monitored during session;Premedicated before session;Utilized relaxation techniques    Home Living                      Prior Function            PT Goals (current goals can  now be found in the care plan section) Acute Rehab PT Goals Patient Stated Goal: none stated PT Goal Formulation: With patient/family Time For Goal Achievement: 01/24/15 Potential to Achieve Goals: Fair Progress towards PT goals: Progressing toward goals    Frequency  BID    PT Plan Discharge plan needs to be updated    Co-evaluation             End of Session Equipment Utilized During Treatment: Gait belt Activity Tolerance:  (Limited by weakness ) Patient left: in bed     Time: 8657-8469 PT Time Calculation (min) (ACUTE ONLY): 24 min  Charges:                       G CodesJanyth Contes 01/18/15, 1:05 PM  Janyth Contes, SPT. [phone]

## 2015-01-11 NOTE — Consult Note (Signed)
[facility] Vascular Consult Note  MRN : [id number]  Lydia Clark is a 74 y.o. (Oct 20, 1940) female who presents with chief complaint of  Chief Complaint  Patient presents with  . Fall  .  History of Present Illness: Patient is a 74 year old female with end-stage renal disease who has been on peritoneal dialysis for some time now. She has been doing well with this without any complaints or problems from her peritoneal dialysis. She had a fall which resulted in a femoral neck fracture requiring surgical repair of this weekend. She is going to rehabilitation this week, and cannot do peritoneal dialysis at rehabilitation. She will not be able to go home and resume peritoneal dialysis immediately, so we are asked to place a PermCath for hemodialysis access while she is in rehabilitation.  Current Facility-Administered Medications  Medication Dose Route Frequency Provider Last Rate Last Dose  . 0.9 %  sodium chloride infusion  250 mL Intravenous PRN Nicholes Mango, MD      . acetaminophen (TYLENOL) tablet 650 mg  650 mg Oral Q6H PRN Nicholes Mango, MD       Or  . acetaminophen (TYLENOL) suppository 650 mg  650 mg Rectal Q6H PRN Aruna Gouru, MD      . albuterol (PROVENTIL) (2.5 MG/3ML) 0.083% nebulizer solution 2.5 mg  2.5 mg Nebulization Q6H PRN Nicholes Mango, MD      . aspirin EC tablet 81 mg  81 mg Oral Daily Nicholes Mango, MD   81 mg at 01/11/15 1217  . cholecalciferol (VITAMIN D) tablet 1,000 Units  1,000 Units Oral Daily Nicholes Mango, MD   1,000 Units at 01/11/15 1217  . dialysis solution 2.5% low-MG/low-CA dianeal solution   Intraperitoneal Q24H Harmeet Singh, MD      . fentaNYL (SUBLIMAZE) injection 25-50 mcg  25-50 mcg Intravenous Q5 min PRN Andria Frames, MD      . furosemide (LASIX) tablet 40 mg  40 mg Oral Daily Nicholes Mango, MD   40 mg at 01/11/15 1218  . gentamicin ointment (GARAMYCIN) 0.1 %   Topical Daily Harmeet Singh, MD      . heparin 1000 unit/ml injection  3,000 Units  3,000 Units Intraperitoneal PRN Murlean Iba, MD      . heparin injection 5,000 Units  5,000 Units Subcutaneous Q12H Claud Kelp, MD   5,000 Units at 01/11/15 1216  . irbesartan (AVAPRO) tablet 37.5 mg  37.5 mg Oral Daily Nicholes Mango, MD   37.5 mg at 01/11/15 1217  . lactulose (CHRONULAC) 10 GM/15ML solution 20 g  20 g Oral BID PRN Nicholes Mango, MD      . loratadine (CLARITIN) tablet 10 mg  10 mg Oral Daily Nicholes Mango, MD   10 mg at 01/11/15 1217  . LORazepam (ATIVAN) tablet 1 mg  1 mg Oral QHS PRN Nicholes Mango, MD   1 mg at 01/09/15 2149  . mirtazapine (REMERON) tablet 15 mg  15 mg Oral QHS Nicholes Mango, MD   15 mg at 01/10/15 2112  . mometasone-formoterol (DULERA) 100-5 MCG/ACT inhaler 2 puff  2 puff Inhalation BID Nicholes Mango, MD   2 puff at 01/10/15 2120  . morphine 2 MG/ML injection 2 mg  2 mg Intravenous Q4H PRN Nicholes Mango, MD   2 mg at 01/11/15 0522  . morphine 4 MG/ML injection 4 mg  4 mg Intravenous Q4H PRN Nicholes Mango, MD   4 mg at 01/10/15 1418  . nicotine (NICODERM CQ - dosed  in mg/24 hours) patch 14 mg  14 mg Transdermal Daily Nicholes Mango, MD   14 mg at 01/11/15 1216  . ondansetron (ZOFRAN) injection 4 mg  4 mg Intravenous Q6H PRN Harrie Foreman, MD      . oxyCODONE (Oxy IR/ROXICODONE) immediate release tablet 5-10 mg  5-10 mg Oral Q3H PRN Lattie Corns, PA-C      . pantoprazole (PROTONIX) EC tablet 40 mg  40 mg Oral Daily Nicholes Mango, MD   40 mg at 01/11/15 1217  . rosuvastatin (CRESTOR) tablet 10 mg  10 mg Oral QPM Nicholes Mango, MD   10 mg at 01/10/15 2112  . sodium chloride 0.9 % injection 3 mL  3 mL Intravenous Q12H Nicholes Mango, MD   3 mL at 01/11/15 1218  . sodium chloride 0.9 % injection 3 mL  3 mL Intravenous PRN Nicholes Mango, MD      . tiotropium (SPIRIVA) inhalation capsule 18 mcg  18 mcg Inhalation QHS Nicholes Mango, MD   18 mcg at 01/10/15 2112  . traMADol (ULTRAM) tablet 50 mg  50 mg Oral Q12H PRN Lattie Corns, PA-C   50 mg at 01/11/15 1504     Past Medical History  Diagnosis Date  . Allergy   . Cancer [facility]) 2008    left breast  . Hypertension 2006  . Personal history of tobacco use, presenting hazards to health 2012    30 yrs smoking  . Endocrine disorder 2008    thyroid  . Personal history of malignant neoplasm of breast 2008  . Breast screening, unspecified 2013  . Special screening for malignant neoplasms, colon 2013  . COPD (chronic obstructive pulmonary disease) ([place])   . PE (pulmonary embolism)     patient denies  . Swelling of throat     swelling at base of throat,  . Anxiety     anxiety  . Pneumonia     hx  . Heart murmur     child  . History of kidney stones   . GERD (gastroesophageal reflux disease)   . Arthritis   . Multinodular goiter     followed by Dr. Carloyn Manner @ [facility]  . Dialysis patient ([place]) 2014  . Chronic kidney disease 2014    stage IV chronic     Past Surgical History  Procedure Laterality Date  . Breast surgery Left 2008    lumpectomy  . Fooy Left     lft foot  . Foot surgery  2005  . 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 Tonsillectomy  2005  . Breast biopsy  2005  . Appendectomy  2005  . Replacement total knee Left 2012    Partial   . Colonoscopy  2007    done in [place]  . Insertion of dialysis catheter Right 07/02/2012    Procedure: INSERTION OF DIALYSIS CATHETER;  Surgeon: Elam Dutch, MD;  Location: [facility];  Service: Vascular;  Laterality: Right;  Right Internal Jugular  . Insertion of dialysis catheter Right July 02 2012    right chest/ temporary cath  . Hip arthroplasty Left 01/10/2015    Procedure: ARTHROPLASTY BIPOLAR HIP (HEMIARTHROPLASTY);  Surgeon: Claud Kelp, MD;  Location: [facility];  Service: Orthopedics;  Laterality: Left;    Social History Social History  Substance Use Topics  . Smoking status: Current Every Day Smoker -- 0.30 packs/day for 30 years    Types: Cigarettes  .  Smokeless tobacco: Never Used  .  Alcohol Use: No  No IVDU  Family History Family History  Problem Relation Age of Onset  . Cancer Other     breast cancer  . Heart attack Father   . Pneumonia Father   no bleeding or clotting disorders  Allergies  Allergen Reactions  . Bee Venom Swelling and Other (See Comments)    Breathing problems  . Biaxin [Clarithromycin] Other (See Comments)    Throat swells  . Mycostatin [Nystatin] Other (See Comments)    Blisters from the ointment  . Polysorbate     Other reaction(s): Unknown  . Sulfa Antibiotics Other (See Comments)    "welps"     REVIEW OF SYSTEMS (Negative unless checked)  Constitutional: [] Weight loss  [] Fever  [] Chills Cardiac: [] Chest pain   [] Chest pressure   [] Palpitations   [] Shortness of breath when laying flat   [] Shortness of breath at rest   [] Shortness of breath with exertion. Vascular:  [] Pain in legs with walking   [] Pain in legs at rest   [] Pain in legs when laying flat   [] Claudication   [] Pain in feet when walking  [] Pain in feet at rest  [] Pain in feet when laying flat   [] History of DVT   [] Phlebitis   [] Swelling in legs   [] Varicose veins   [] Non-healing ulcers Pulmonary:   [] Uses home oxygen   [] Productive cough   [] Hemoptysis   [] Wheeze  [x] COPD   [] Asthma Neurologic:  [] Dizziness  [] Blackouts   [] Seizures   [] History of stroke   [] History of TIA  [] Aphasia   [] Temporary blindness   [] Dysphagia   [] Weakness or numbness in arms   [] Weakness or numbness in legs Musculoskeletal:  [] Arthritis   [] Joint swelling   [x] Joint pain   [] Low back pain Hematologic:  [] Easy bruising  [] Easy bleeding   [] Hypercoagulable state   [] Anemic  [] Hepatitis Gastrointestinal:  [] Blood in stool   [] Vomiting blood  [] Gastroesophageal reflux/heartburn   [] Difficulty swallowing. Genitourinary:  [x] Chronic kidney disease   [] Difficult urination  [] Frequent urination  [] Burning with urination   [] Blood in urine Skin:  [] Rashes   [] Ulcers   [] Wounds Psychological:  [] History  of anxiety   []  History of major depression.  Physical Examination  Filed Vitals:   01/11/15 0537 01/11/15 0610 01/11/15 0835 01/11/15 1449  BP: 134/59  130/57 145/70  Pulse: 95  95 103  Temp: 98.9 F (37.2 C)  98.6 F (37 C)   TempSrc: Oral  Axillary   Resp: 18   18  Height:      Weight: 63.05 kg (139 lb)     SpO2: 82% 100% 92% 96%   Body mass index is 21.77 kg/(m^2). Gen:  thin, NAD Head: [place]/AT, No temporalis wasting. Prominent temp pulse not noted. Ear/Nose/Throat: Hearing grossly intact, nares w/o erythema or drainage, oropharynx w/o Erythema/Exudate Eyes: PERRLA, EOMI.  Neck: Supple, no nuchal rigidity.  No JVD.  Pulmonary:  Good air movement, no use of accessory muscles  Cardiac: RRR, normal S1, S2,  Vascular:  Vessel Right Left  Radial Palpable Palpable                                   Gastrointestinal: soft, non-tender/non-distended. No guarding/reflex. PD catheter in place without erythema or drainage around catheter Neurologic: CN 2-12 intact. Pain and light touch intact in extremities.  Symmetrical.  Speech is fluent.  Psychiatric:  Judgment intact, Mood & affect appropriate for pt's clinical situation. Dermatologic: No rashes or ulcers noted.  No cellulitis or open wounds. Lymph : No Cervical, Axillary, or Inguinal lymphadenopathy.      CBC Lab Results  Component Value Date   WBC 8.5 01/11/2015   HGB 11.8* 01/11/2015   HCT 36.1 01/11/2015   MCV 94.3 01/11/2015   PLT 252 01/11/2015    BMET    Component Value Date/Time   NA 135 01/11/2015 0332   NA 130* 08/11/2013 1629   K 4.2 01/11/2015 0332   K 3.3* 08/11/2013 1629   CL 101 01/11/2015 0332   CL 93* 08/11/2013 1629   CO2 27 01/11/2015 0332   CO2 29 08/11/2013 1629   GLUCOSE 117* 01/11/2015 0332   GLUCOSE 92 08/11/2013 1629   BUN 50* 01/11/2015 0332   BUN 50* 08/11/2013 1629   CREATININE 6.85* 01/11/2015 0332   CREATININE 5.63* 08/11/2013 1629   CALCIUM 8.3* 01/11/2015 0332    CALCIUM 8.5 08/11/2013 1629   GFRNONAA 5* 01/11/2015 0332   GFRNONAA 7* 08/11/2013 1629   GFRAA 6* 01/11/2015 0332   GFRAA 8* 08/11/2013 1629   Estimated Creatinine Clearance: 7.2 mL/min (by C-G formula based on Cr of 6.85).  COAG Lab Results  Component Value Date   INR 0.85 01/09/2015    Radiology Dg Chest 1 View  01/09/2015  CLINICAL DATA:  74 year old female with history of left hip fracture. Preoperative study. EXAM: CHEST 1 VIEW COMPARISON:  Chest x-ray 04/07/2014. FINDINGS: Emphysematous changes throughout the lungs bilaterally. No acute consolidative airspace disease. No pleural effusions. No definite suspicious appearing pulmonary nodules or masses. No evidence of pulmonary edema. Heart size is upper limits of normal. Upper mediastinal contours are within normal limits. Atherosclerosis in the thoracic aorta. IMPRESSION: 1. No radiographic evidence of acute cardiopulmonary disease. 2. Emphysema. 3. Atherosclerosis. Electronically Signed   By: Vinnie Langton M.D.   On: 01/09/2015 18:00   Dg Pelvis 1-2 Views  01/10/2015  CLINICAL DATA:  74 year old female status post left femur fracture repair. Initial encounter. EXAM: PELVIS - 1-2 VIEW COMPARISON:  Lennie Odor 07/2014. FINDINGS: AP view of the pelvis at 1141 hours. Proximal left femur arthroplasty now in place. The femoral head component appears normally aligned with the acetabulum on this AP view. Postoperative changes to the surrounding soft tissues including subcutaneous gas, overlying skin staples. Other visualized osseous structures appear stable. IMPRESSION: Left proximal femoral arthroplasty with no adverse features identified. Electronically Signed   By: Genevie Ann M.D.   On: 01/10/2015 12:59   Ct Head Wo Contrast  01/09/2015  CLINICAL DATA:  Multiple falls, bruise along the temporal area EXAM: CT HEAD WITHOUT CONTRAST TECHNIQUE: Contiguous axial images were obtained from the base of the skull through the vertex without intravenous  contrast. COMPARISON:  None. FINDINGS: There is no evidence of mass effect, midline shift, or extra-axial fluid collections. There is no evidence of a space-occupying lesion or intracranial hemorrhage. There is no evidence of a cortical-based area of acute infarction. There is generalized cerebral atrophy. There is periventricular white matter low attenuation likely secondary to microangiopathy. The ventricles and sulci are appropriate for the patient's age. The basal cisterns are patent. Visualized portions of the orbits are unremarkable. The visualized portions of the paranasal sinuses and mastoid air cells are unremarkable. Cerebrovascular atherosclerotic calcifications are noted. The osseous structures are unremarkable. IMPRESSION: 1. No acute intracranial pathology. 2. Chronic microvascular disease and cerebral atrophy. 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 EXAM: DG HIP (WITH OR WITHOUT PELVIS) 2-3V LEFT COMPARISON:  No priors. FINDINGS: Three views of the bony pelvis and left hip demonstrate an acute transcervical left femoral neck fracture which appears mildly comminuted. There is approximately 1.6 cm of proximal migration of the femur relative to the proximal fragment. Femoral head remains located. Small adjacent bony fragment immediately superior and lateral to the femoral head also noted. Bony pelvis itself appears intact, as does the visualized portions of the right proximal femur. IMPRESSION: 1. Acute displaced minimally comminuted transcervical left femoral neck fracture, as above. Electronically Signed   By: Vinnie Langton M.D.   On: 01/09/2015 17:42     Assessment/Plan 1. ESRD. Unable to do PD at rehab so will need a HD catheter.  Will plan to place tomorrow if schedule  allows.  If not, will do Wednesday am. 2. Femoral neck fracture. S/p repair.  This is the need for rehab.  3. COPD. stable   DEW,JASON, MD  01/11/2015 4:44 PM

## 2015-01-12 ENCOUNTER — Encounter
Admission: EM | Disposition: A | Discharge status: Skilled Nursing Facility | Payer: Self-pay | Source: Home / Self Care | Attending: Internal Medicine

## 2015-01-12 LAB — BASIC METABOLIC PANEL
Anion gap: 9 (ref 5–15)
BUN: 64 mg/dL — AB (ref 6–20)
CALCIUM: 7.5 mg/dL — AB (ref 8.9–10.3)
CO2: 26 mmol/L (ref 22–32)
Chloride: 93 mmol/L — ABNORMAL LOW (ref 101–111)
Creatinine, Ser: 8.05 mg/dL — ABNORMAL HIGH (ref 0.44–1.00)
GFR calc Af Amer: 5 mL/min — ABNORMAL LOW (ref >60)
GFR calc non Af Amer: 4 mL/min — ABNORMAL LOW (ref >60)
GLUCOSE: 90 mg/dL (ref 65–99)
POTASSIUM: 4.3 mmol/L (ref 3.5–5.1)
SODIUM: 128 mmol/L — AB (ref 135–145)

## 2015-01-12 LAB — CBC
HCT: 30.4 % — ABNORMAL LOW (ref 35.0–47.0)
Hemoglobin: 9.9 g/dL — ABNORMAL LOW (ref 12.0–16.0)
MCH: 30.6 pg (ref 26.0–34.0)
MCHC: 32.6 g/dL (ref 32.0–36.0)
MCV: 93.8 fL (ref 80.0–100.0)
PLATELETS: 216 10*3/uL (ref 150–440)
RBC: 3.24 MIL/uL — AB (ref 3.80–5.20)
RDW: 15.1 % — AB (ref 11.5–14.5)
WBC: 10.9 10*3/uL (ref 3.6–11.0)

## 2015-01-12 LAB — GLUCOSE, CAPILLARY: Glucose-Capillary: 78 mg/dL (ref 65–99)

## 2015-01-12 LAB — SURGICAL PATHOLOGY

## 2015-01-12 SURGERY — DIALYSIS/PERMA CATHETER INSERTION
Anesthesia: Moderate Sedation

## 2015-01-12 MED ORDER — MANNITOL 25 % IV SOLN
12.5000 g | Freq: Once | INTRAVENOUS | Status: AC
  Administered 2015-01-13: 12.5 g via INTRAVENOUS
  Filled 2015-01-12 (×3): qty 50

## 2015-01-12 MED ORDER — ENSURE ENLIVE PO LIQD
237.0000 mL | Freq: Two times a day (BID) | ORAL | Status: DC
  Administered 2015-01-12: 237 mL via ORAL

## 2015-01-12 MED ORDER — DEXTROSE-NACL 5-0.45 % IV SOLN
INTRAVENOUS | Status: DC
  Administered 2015-01-12: 14:00:00 via INTRAVENOUS

## 2015-01-12 NOTE — Progress Notes (Signed)
Physical Therapy Treatment Patient Details Name: Lydia Clark MRN: [id number] DOB: 09-05-1940 Today's Date: 01/12/2015    History of Present Illness Lydia Clark is a 74 y.o. female with a known history of hypertension, end-stage renal disease on peritoneal dialysis, chronic COPD and continues to smoke is presenting to the ED after she sustained a fall. X-ray has revealed left femoral neck fracture and CT head is negative for any acute abnormalities. Patient is s/p left hip hemiarthroplasty. She is on posterior hip precautions at this time.     PT Comments    Pt limited by fatigue this afternoon and states that she hasn't eaten any food in the past 24 hrs. She was weaker this morning, and appeared as weak if not weaker this afternoon given her bed mobility and bed exercise. Due to her decreased energy and strength, transfers and ambulation were deferred until tomorrow when she has more strength/endurance to perform mobility. She did perform exercise, and was pleasant throughout that. She will continue to benefit from skilled PT in order to address her deficits and return to optimal PLOF.   Follow Up Recommendations  SNF     Equipment Recommendations  Rolling walker with 5" wheels    Recommendations for Other Services       Precautions / Restrictions Precautions Precautions: Fall Restrictions Weight Bearing Restrictions: Yes LLE Weight Bearing: Weight bearing as tolerated Other Position/Activity Restrictions: posterior hip precautions    Mobility  Bed Mobility Overal bed mobility: Needs Assistance Bed Mobility: Supine to Sit     Supine to sit: Min assist Sit to supine: Mod assist;+2 for physical assistance   General bed mobility comments: Pt requiring slightly more assist for bed mobility with more assist for trunk this afternoon. Once in sitting, pt c/o light headedness and fatigue. Denies diaphoresis or feeling of passing out, just how she has been feeling the past few  days.   Transfers Overall transfer level:  (deferred until tomorrow)             Ambulation/Gait Ambulation/Gait assistance:  (deferred until tomorrow)               Stairs            Wheelchair Mobility    Modified Rankin (Stroke Patients Only)       Balance Overall balance assessment: History of Falls                                  Cognition Arousal/Alertness: Awake/alert Behavior During Therapy: WFL for tasks assessed/performed Overall Cognitive Status: Within Functional Limits for tasks assessed                      Exercises Other Exercises Other Exercises: Pt performed bilateral therex x 15 reps at min assist for facilitation of movement. Exercises performed: ankle pumps, quad sets, SAQ, SLR, and hip ABD. Pt not showing too much pain during therex    General Comments        Pertinent Vitals/Pain Pain Assessment: No/denies pain    Home Living                      Prior Function            PT Goals (current goals can now be found in the care plan section) Acute Rehab PT Goals Patient Stated Goal: none stated PT Goal Formulation: With patient/family Time  For Goal Achievement: 01/24/15 Potential to Achieve Goals: Fair Progress towards PT goals: Progressing toward goals    Frequency  BID    PT Plan Current plan remains appropriate    Co-evaluation             End of Session Equipment Utilized During Treatment: Gait belt Activity Tolerance: Patient limited by fatigue Patient left: in bed;with call bell/phone within reach;with bed alarm set;with family/visitor present     Time: 1282-0813 PT Time Calculation (min) (ACUTE ONLY): 11 min  Charges:                       G CodesJanyth Contes 01-25-2015, 5:11 PM  Janyth Contes, SPT. [phone]

## 2015-01-12 NOTE — Progress Notes (Signed)
[facility]  ROUNDING NOTE   Subjective:   Still somewhat confused.  Held PD last night. Plan for tunneled catheter for later today.  Son at bedside Na 128 Potassium at goal.   Objective:  Vital signs in last 24 hours:  Temp:  [97.9 F (36.6 C)-100 F (37.8 C)] 97.9 F (36.6 C) (11/08 0734) Pulse Rate:  [86-103] 87 (11/08 0734) Resp:  [16-18] 16 (11/08 0734) BP: (137-155)/(68-73) 137/70 mmHg (11/08 0734) SpO2:  [94 %-96 %] 95 % (11/08 0734) Weight:  [60.9 kg (134 lb 4.2 oz)] 60.9 kg (134 lb 4.2 oz) (11/08 0449)  Weight change: -2.15 kg (-4 lb 11.8 oz) Filed Weights   01/11/15 0537 01/12/15 0445 01/12/15 0449  Weight: 63.05 kg (139 lb) 60.9 kg (134 lb 4.2 oz) 60.9 kg (134 lb 4.2 oz)    Intake/Output: I/O last 3 completed shifts: In: 0  Out: 906 [Other:906]   Intake/Output this shift:     Physical Exam: General: NAD  Head: Normocephalic, atraumatic. Moist oral mucosal membranes  Eyes: Anicteric, PERRL  Neck: Supple, trachea midline  Lungs:  Clear to auscultation  Heart: Regular rate and rhythm  Abdomen:  Soft, nontender  Extremities: no peripheral edema  Neurologic: confused  Skin: No lesions  Access PD catheter    Basic Metabolic Panel:  Recent Labs Lab 01/09/15 1731 01/11/15 0332 01/12/15 0519  NA 135 135 128*  K 3.9 4.2 4.3  CL 96* 101 93*  CO2 29 27 26   GLUCOSE 92 117* 90  BUN 49* 50* 64*  CREATININE 6.81* 6.85* 8.05*  CALCIUM 8.9 8.3* 7.5*    Liver Function Tests:  Recent Labs Lab 01/09/15 1731  AST 30  ALT 17  ALKPHOS 71  BILITOT 0.2*  PROT 6.5  ALBUMIN 2.9*   No results for input(s): LIPASE, AMYLASE in the last 168 hours. No results for input(s): AMMONIA in the last 168 hours.  CBC:  Recent Labs Lab 01/09/15 1731 01/11/15 0332 01/12/15 0519  WBC 8.5 8.5 10.9  HGB 13.0 11.8* 9.9*  HCT 38.7 36.1 30.4*  MCV 95.0 94.3 93.8  PLT 228 252 216    Cardiac Enzymes: No results for input(s): CKTOTAL, CKMB,  CKMBINDEX, TROPONINI in the last 168 hours.  BNP: Invalid input(s): POCBNP  CBG: No results for input(s): GLUCAP in the last 168 hours.  Microbiology: Results for orders placed or performed during the hospital encounter of 07/01/12  Surgical pcr screen     Status: None   Collection Time: 07/01/12 11:37 AM  Result Value Ref Range Status   MRSA, PCR NEGATIVE NEGATIVE Final   Staphylococcus aureus NEGATIVE NEGATIVE Final    Comment:        The Xpert SA Assay (FDA approved for NASAL specimens in patients over 40 years of age), is one component of a comprehensive surveillance program.  Test performance has been validated by [employer] for patients greater than or equal to 34 year old. It is not intended to diagnose infection nor to guide or monitor treatment.    Coagulation Studies:  Recent Labs  01/09/15 1731  LABPROT 11.8  INR 0.85    Urinalysis: No results for input(s): COLORURINE, LABSPEC, PHURINE, GLUCOSEU, HGBUR, BILIRUBINUR, KETONESUR, PROTEINUR, UROBILINOGEN, NITRITE, LEUKOCYTESUR in the last 72 hours.  Invalid input(s): APPERANCEUR    Imaging: Dg Pelvis 1-2 Views  01/10/2015  CLINICAL DATA:  74 year old female status post left femur fracture repair. Initial encounter. EXAM: PELVIS - 1-2 VIEW COMPARISON:  Lennie Odor 07/2014. FINDINGS: AP  view of the pelvis at 1141 hours. Proximal left femur arthroplasty now in place. The femoral head component appears normally aligned with the acetabulum on this AP view. Postoperative changes to the surrounding soft tissues including subcutaneous gas, overlying skin staples. Other visualized osseous structures appear stable. IMPRESSION: Left proximal femoral arthroplasty with no adverse features identified. Electronically Signed   By: Genevie Ann M.D.   On: 01/10/2015 12:59     Medications:   . sodium chloride     . aspirin EC  81 mg Oral Daily  . Chlorhexidine Gluconate Cloth  6 each Topical Once  . cholecalciferol  1,000 Units  Oral Daily  . dialysis solution 2.5% low-MG/low-CA   Intraperitoneal Q24H  . furosemide  40 mg Oral Daily  . gentamicin ointment   Topical Daily  . heparin subcutaneous  5,000 Units Subcutaneous Q12H  . irbesartan  37.5 mg Oral Daily  . loratadine  10 mg Oral Daily  . mannitol  12.5 g Intravenous Once  . mirtazapine  15 mg Oral QHS  . mometasone-formoterol  2 puff Inhalation BID  . nicotine  14 mg Transdermal Daily  . pantoprazole  40 mg Oral Daily  . rosuvastatin  10 mg Oral QPM  . sodium chloride  3 mL Intravenous Q12H  . tiotropium  18 mcg Inhalation QHS   sodium chloride, acetaminophen **OR** acetaminophen, albuterol, fentaNYL (SUBLIMAZE) injection, heparin, lactulose, LORazepam, morphine injection, morphine injection, ondansetron (ZOFRAN) IV, oxyCODONE, sodium chloride, traMADol  Assessment/ Plan:  Ms. NAYELLY LAUGHMAN is a 74 y.o. female withe renal disease on peritoneal dialysis, hypertension, COPD, GERD, hyperlipidemia, generalized anxiety disorder, history depression, anemia of chronic kidney disease, secondary hyperparathyroidism and osteopenia.  [facility] PD   1. End Stage Renal Disease: tolerating PD well. However now going to SNF, [facility]. Cannot do PD there and will need to transition to HD. Tunneled catheter for later today.  She will need to be able to sit in a chair for four hours before discharge.  Held PD last night. If no HD today, will resume PD tonight.   2. Anemia of chronic kidney disease: hemoglobin at goal. 9.9 - Continue venofer and epo per protocol as outpatient. No indication for epo currently.   3. Secondary Hyperparathyroidism: calcium and phosphorus at goal. PTH at goal as outpatient.  - Calcitriol - No binders.   4. Hypertension: not at goal.  - home regimen of valsartan and furosemide. Hold all agents due to hypotension and dizziness.   5. Nutrition: albumin 2.9. Not eating at home. Suspect depression related.  -  Mirtazapine.  - appreciate  dietician input.   6. Hyponatremia: sodium of 128. May be playing a role in mental status. Believe this is nutrition related. Liberalize diet     LOS: [place], Jesten Cappuccio 11/8/201611:24 AM

## 2015-01-12 NOTE — Progress Notes (Signed)
Physical Therapy Treatment Patient Details Name: Lydia Clark MRN: [id number] DOB: Jun 11, 1940 Today's Date: 01/12/2015    History of Present Illness Lydia Clark is a 74 y.o. female with a known history of hypertension, end-stage renal disease on peritoneal dialysis, chronic COPD and continues to smoke is presenting to the ED after she sustained a fall. X-ray has revealed left femoral neck fracture and CT head is negative for any acute abnormalities. Patient is s/p left hip hemiarthroplasty. She is on posterior hip precautions at this time.     PT Comments    Pt making progress today with therex volume, but is slightly limited this morning secondary to fatigue and weakness. She is motivated to do what the therapist asks of her, but the biggest set back today is the LE weakness as she is more unsteady and wobbly in the legs as compared to yesterday afternoon. Pt to have perm cath placed this afternoon; will attempt therapy again this afternoon with hopes of more energy. Due to weakness and activity tolerance deficits, pt will continue to benefit from skilled PT in order to return to optimal PLOF.   Follow Up Recommendations  SNF     Equipment Recommendations  Rolling walker with 5" wheels    Recommendations for Other Services       Precautions / Restrictions Precautions Precautions: Fall Restrictions Weight Bearing Restrictions: Yes LLE Weight Bearing: Weight bearing as tolerated Other Position/Activity Restrictions: posterior hip precautions    Mobility  Bed Mobility Overal bed mobility: Needs Assistance Bed Mobility: Supine to Sit     Supine to sit: Min assist Sit to supine: Mod assist;+2 for physical assistance   General bed mobility comments: Pt requiring some assist for LEs and trunk. Needs cues on hand placement. Little c/o pain during bed mobility   Transfers Overall transfer level: Needs assistance Equipment used: Rolling walker (2 wheeled) Transfers: Sit to/from  Stand Sit to Stand: Min assist         General transfer comment: Pt much improved with transfer ability this afternoon. She does not require as much assist from therapist and is able to reach terminal knee extension (slightly more wobbily in standing this morning)  Ambulation/Gait Ambulation/Gait assistance:  (not appropriate secondary to weakness)               Stairs            Wheelchair Mobility    Modified Rankin (Stroke Patients Only)       Balance Overall balance assessment: History of Falls                                  Cognition Arousal/Alertness: Awake/alert Behavior During Therapy: Anxious Overall Cognitive Status: Difficult to assess                      Exercises Other Exercises Other Exercises: Pt performed bilateral therex x 15 reps at min assist for facilitation of movement. Exercises performed: ankle pumps, quad sets, SAQ, SLR, and hip ABD. Pt not showing too much pain during therex    General Comments        Pertinent Vitals/Pain Pain Assessment: No/denies pain    Home Living                      Prior Function            PT Goals (current goals  can now be found in the care plan section) Acute Rehab PT Goals Patient Stated Goal: to do what she can PT Goal Formulation: With patient/family Time For Goal Achievement: 01/24/15 Potential to Achieve Goals: Fair Progress towards PT goals: Progressing toward goals    Frequency  BID    PT Plan Current plan remains appropriate    Co-evaluation             End of Session Equipment Utilized During Treatment: Gait belt Activity Tolerance: Patient tolerated treatment well Patient left: in bed;with call bell/phone within reach;with bed alarm set;with nursing/sitter in room;with family/visitor present;with SCD's reapplied     Time: 2902-1115 PT Time Calculation (min) (ACUTE ONLY): 25 min  Charges:                       G CodesJanyth Contes 06-Feb-2015, 2:41 PM  Janyth Contes, SPT. [phone]

## 2015-01-12 NOTE — Progress Notes (Deleted)
Physical Therapy Treatment Patient Details Name: Lydia Clark MRN: [id number] DOB: 07/26/1940 Today's Date: 01/12/2015    History of Present Illness Lydia Clark is a 74 y.o. female with a known history of hypertension, end-stage renal disease on peritoneal dialysis, chronic COPD and continues to smoke is presenting to the ED after she sustained a fall. X-ray has revealed left femoral neck fracture and CT head is negative for any acute abnormalities. Patient is s/p left hip hemiarthroplasty. She is on posterior hip precautions at this time.     PT Comments    Pt limited by fatigue this afternoon and states that she hasn't eaten any food in the past 24 hrs. She was weaker this morning, and appeared as weak if not weaker this afternoon given her bed mobility and bed exercise. Due to her decreased energy and strength, transfers and ambulation were deferred until tomorrow when she has more strength/endurance to perform mobility. She did perform exercise, and was pleasant throughout that. She will continue to benefit from skilled PT in order to address her deficits and return to optimal PLOF.   Follow Up Recommendations  SNF     Equipment Recommendations  Rolling walker with 5" wheels    Recommendations for Other Services       Precautions / Restrictions Precautions Precautions: Fall Restrictions Weight Bearing Restrictions: Yes LLE Weight Bearing: Weight bearing as tolerated Other Position/Activity Restrictions: posterior hip precautions    Mobility  Bed Mobility Overal bed mobility: Needs Assistance Bed Mobility: Supine to Sit     Supine to sit: Min assist Sit to supine: Mod assist;+2 for physical assistance   General bed mobility comments: Pt requiring slightly more assist for bed mobility with more assist for trunk this afternoon. Once in sitting, pt c/o light headedness and fatigue. Denies diaphoresis or feeling of passing out, just how she has been feeling the past few  days.   Transfers Overall transfer level:  (deferred until tomorrow) Equipment used: Rolling walker (2 wheeled) Transfers: Sit to/from Stand Sit to Stand: Min assist         General transfer comment: Pt much improved with transfer ability this afternoon. She does not require as much assist from therapist and is able to reach terminal knee extension (slightly more wobbily in standing this morning)  Ambulation/Gait Ambulation/Gait assistance:  (deferred until tomorrow)               Stairs            Wheelchair Mobility    Modified Rankin (Stroke Patients Only)       Balance Overall balance assessment: History of Falls                                  Cognition Arousal/Alertness: Awake/alert Behavior During Therapy: WFL for tasks assessed/performed Overall Cognitive Status: Within Functional Limits for tasks assessed                      Exercises Other Exercises Other Exercises: Pt performed bilateral therex x 15 reps at min assist for facilitation of movement. Exercises performed: ankle pumps, quad sets, SAQ, SLR, and hip ABD. Pt not showing too much pain during therex    General Comments        Pertinent Vitals/Pain Pain Assessment: No/denies pain    Home Living  Prior Function            PT Goals (current goals can now be found in the care plan section) Acute Rehab PT Goals Patient Stated Goal: none stated PT Goal Formulation: With patient/family Time For Goal Achievement: 01/24/15 Potential to Achieve Goals: Fair Progress towards PT goals: Progressing toward goals    Frequency  BID    PT Plan Current plan remains appropriate    Co-evaluation             End of Session Equipment Utilized During Treatment: Gait belt Activity Tolerance: Patient limited by fatigue Patient left: in bed;with call bell/phone within reach;with bed alarm set;with family/visitor present     Time:  8251-8984 PT Time Calculation (min) (ACUTE ONLY): 11 min  Charges:                       G CodesJanyth Clark 01/18/15, 2:55 PM Lydia Clark, SPT. [phone]

## 2015-01-12 NOTE — Progress Notes (Signed)
Clinical Education officer, museum (CSW) met with patient and presented bed offers. Patient chose [facility]. CSW attempted to call patient's son however the number on the face sheet is not a working number. CSW attempted to contact patient's daughter Joseph Art however she did not answer the phone. Per Dr. Juleen China patient will be hemodialysis while at [facility]. Kim dialysis liaison is working on getting patient set up at [facility] on [address] . CSW will continue to follow and assist as needed.   Blima Rich, [place] [phone]

## 2015-01-12 NOTE — Care Management Note (Signed)
Modality placement completed.  Patient can start out patient hemo Thursday 01/14/15.  She will need to be there at 11:10 for 1st treatment then 11:30 starting 01/15/15. Porter  Iran Sizer  Dialysis Liaison  [phone]

## 2015-01-12 NOTE — Progress Notes (Signed)
Initial Nutrition Assessment   INTERVENTION:   Meals and Snacks: Cater to patient preferences; Recommend Regular diet order to optimize nutritional intake. Will send egg salad as 3pm snack. Pt aware of higher protein needs per report. Medical Food Supplement Therapy: pt reports liking ensure PTA, trying 'another one earlier that was not good.' will recommend Ensure Enlive po BID, each supplement provides 350 kcal and 20 grams of protein. Will also send Magic Cup BID as pt likes ice cream and is a small portion.    NUTRITION DIAGNOSIS:   Increased nutrient needs related to chronic illness, acute illness as evidenced by estimated needs.  GOAL:   Patient will meet greater than or equal to 90% of their needs  MONITOR:    (Energy Intake, Electrolyte and Renal Profile, Anthropometrics, Digestive system)  REASON FOR ASSESSMENT:   Diagnosis (Verbal consult from Nephrology for Poor po)    ASSESSMENT:   Pt admitted with left femoral neck fracture s/p hemiarthroplasty on 01/10/2015. Pt with h/o ESRD on PD for the past 4 years per nephrology. Pt now requiring HD cath for HD while in rehab at discharge. Pt scheduled for HD cath this afternoon.   Past Medical History  Diagnosis Date  . Allergy   . 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 Hypertension 2006  . Personal history of tobacco use, presenting hazards to health 2012    30 yrs smoking  . Endocrine disorder 2008    thyroid  . Personal history of malignant neoplasm of breast 2008  . Breast screening, unspecified 2013  . Special screening for malignant neoplasms, colon 2013  . COPD (chronic obstructive pulmonary disease) ([place])   . PE (pulmonary embolism)     patient denies  . Swelling of throat     swelling at base of throat,  . Anxiety     anxiety  . Pneumonia     hx  . Heart murmur     child  . History of kidney stones   . GERD (gastroesophageal reflux disease)   . Arthritis   . Multinodular goiter     followed by Dr.  Carloyn Manner @ [facility]  . Dialysis patient ([facility]) 2014  . Chronic kidney disease 2014    stage IV chronic      Diet Order:  Diet NPO time specified    Current Nutrition: Pt NPO today for procedure. Per RN Maudie Mercury pt will have HD cath placement and then will be able to eat prior to HD later today.  Food/Nutrition-Related History: Pt reports she 'always' eaten small amounts but that appetite has been even less since after diagnosis with breast cancer and passing of husband 7 years ago. Pt reports since starting PD, the issue is that she experiences early satiety as her fluid collects throughout the day prior to PD treatment at night. Pt reports eating a cereal bar with coffee in the am, sandwich at lunch and small dinner such as takeout seafood. Pt reports living at home and makes her own meals. Pt reports drinking Ensure PTA usually in the afternoon.    Scheduled Medications:  . aspirin EC  81 mg Oral Daily  . Chlorhexidine Gluconate Cloth  6 each Topical Once  . cholecalciferol  1,000 Units Oral Daily  . dialysis solution 2.5% low-MG/low-CA   Intraperitoneal Q24H  . feeding supplement (ENSURE ENLIVE)  237 mL Oral BID BM  . gentamicin ointment   Topical Daily  . heparin subcutaneous  5,000 Units Subcutaneous  Q12H  . loratadine  10 mg Oral Daily  . mannitol  12.5 g Intravenous Once  . mirtazapine  15 mg Oral QHS  . mometasone-formoterol  2 puff Inhalation BID  . nicotine  14 mg Transdermal Daily  . pantoprazole  40 mg Oral Daily  . rosuvastatin  10 mg Oral QPM  . sodium chloride  3 mL Intravenous Q12H  . tiotropium  18 mcg Inhalation QHS    Continuous Medications:  . sodium chloride    . dextrose 5 % and 0.45% NaCl 25 mL/hr at 01/12/15 1415     Electrolyte/Renal Profile and Glucose Profile:   Recent Labs Lab 01/09/15 1731 01/11/15 0332 01/12/15 0519  NA 135 135 128*  K 3.9 4.2 4.3  CL 96* 101 93*  CO2 29 27 26   BUN 49* 50* 64*  CREATININE 6.81* 6.85* 8.05*   CALCIUM 8.9 8.3* 7.5*  GLUCOSE 92 117* 90   Protein Profile:  Recent Labs Lab 01/09/15 1731  ALBUMIN 2.9*    Gastrointestinal Profile: Last BM:  01/09/2015   Nutrition-Focused Physical Exam Findings:  Unable to complete Nutrition-Focused physical exam at this time.    Weight Change: Pt reports weight started coming down 7 years ago, but that it fluctuates with PD treatments. Pt reports recently weighing 125lbs.   Height:   Ht Readings from Last 1 Encounters:  01/09/15 5\' 7"  (1.702 m)    Weight:   Wt Readings from Last 1 Encounters:  01/12/15 134 lb 4.2 oz (60.9 kg)    Wt Readings from Last 10 Encounters:  01/12/15 134 lb 4.2 oz (60.9 kg)  07/22/14 131 lb (59.421 kg)  06/16/14 130 lb (58.968 kg)  05/11/14 128 lb (58.06 kg)  07/29/13 133 lb (60.328 kg)  10/15/12 124 lb (56.246 kg)  07/23/12 129 lb (58.514 kg)  07/01/12 128 lb 8.4 oz (58.299 kg)  09/28/11 130 lb (58.968 kg)     BMI:  Body mass index is 21.02 kg/(m^2).  Estimated Nutritional Needs:   Kcal:  BEE: 1105kcals, TEE: (IF 1.2-1.4)(AF 1.2) 1591-1856kcals  Protein:  80-100g protein (1.2-1.5g/kg)  Fluid:  UOP+1L  EDUCATION NEEDS:   No education needs identified at this time   [place], RD, LDN Pager [phone]

## 2015-01-12 NOTE — Progress Notes (Signed)
[facility]                                                                                                                                                                                            Patient Demographics   Lydia Clark, is a 74 y.o. female, DOB - [birth year], OQH:[id number]  Admit date - 01/09/2015   Admitting Physician Claud Kelp, MD  Outpatient Primary MD for the patient is HAWKINS,EDWARD L, MD   LOS - 3  Subjective: Patient noted to have urinary retention earlier. Had to have an in and out catheter. She otherwise had pain with physical therapy.     Review of Systems:   CONSTITUTIONAL: No documented fever. No fatigue, weakness. No weight gain, no weight loss.  EYES: No blurry or double vision.  ENT: No tinnitus. No postnasal drip. No redness of the oropharynx.  RESPIRATORY: No cough, no wheeze, no hemoptysis. No dyspnea.  CARDIOVASCULAR: No chest pain. No orthopnea. No palpitations. No syncope.  GASTROINTESTINAL: No nausea, no vomiting or diarrhea. No abdominal pain. No melena or hematochezia.  GENITOURINARY: No dysuria or hematuria.  ENDOCRINE: No polyuria or nocturia. No heat or cold intolerance.  HEMATOLOGY: No anemia. No bruising. No bleeding.  INTEGUMENTARY: No rashes. No lesions.  MUSCULOSKELETAL: No arthritis. No swelling. No gout. + hip pain NEUROLOGIC: No numbness, tingling, or ataxia. No seizure-type activity.  PSYCHIATRIC: No anxiety. No insomnia. No ADD.    Vitals:   Filed Vitals:   01/12/15 0413 01/12/15 0445 01/12/15 0449 01/12/15 0734  BP: 140/68   137/70  Pulse: 86   87  Temp: 98.5 F (36.9 C)   97.9 F (36.6 C)  TempSrc: Oral   Oral  Resp: 18   16  Height:      Weight:  60.9 kg (134 lb 4.2 oz) 60.9 kg (134 lb 4.2 oz)   SpO2: 96%   95%    Wt Readings from Last 3 Encounters:  01/12/15 60.9 kg (134 lb 4.2 oz)  07/22/14 59.421 kg (131 lb)  06/16/14 58.968 kg (130 lb)      Intake/Output Summary (Last 24 hours) at 01/12/15 1436 Last data filed at 01/12/15 1249  Gross per 24 hour  Intake      0 ml  Output    400 ml  Net   -400 ml    Physical Exam:   GENERAL: Pleasant-appearing in no apparent distress.  HEAD, EYES, EARS, NOSE AND THROAT: Atraumatic, normocephalic. Extraocular muscles are intact. Pupils equal and reactive to light. Sclerae anicteric. No conjunctival injection. No oro-pharyngeal erythema.  NECK: Supple.  There is no jugular venous distention. No bruits, no lymphadenopathy, no thyromegaly.  HEART: Regular rate and rhythm,. No murmurs, no rubs, no clicks.  LUNGS: Clear to auscultation bilaterally. No rales or rhonchi. No wheezes.  ABDOMEN: Soft, flat, nontender, nondistended. Has good bowel sounds. No hepatosplenomegaly appreciated.  EXTREMITIES: No evidence of any cyanosis, clubbing, or peripheral edema.  +2 pedal and radial pulses bilaterally.  NEUROLOGIC: The patient is alert, awake, and oriented x3 with no focal motor or sensory deficits appreciated bilaterally.  SKIN: Moist and warm with no rashes appreciated.  Psych: Not anxious, depressed LN: No inguinal LN enlargement    Antibiotics   Anti-infectives    Start     Dose/Rate Route Frequency Ordered Stop   01/10/15 1400  ceFAZolin (ANCEF) IVPB 1 g/50 mL premix     1 g 100 mL/hr over 30 Minutes Intravenous 3 times per day 01/10/15 1326 01/11/15 0552      Medications   Scheduled Meds: . aspirin EC  81 mg Oral Daily  . Chlorhexidine Gluconate Cloth  6 each Topical Once  . cholecalciferol  1,000 Units Oral Daily  . dialysis solution 2.5% low-MG/low-CA   Intraperitoneal Q24H  . gentamicin ointment   Topical Daily  . heparin subcutaneous  5,000 Units Subcutaneous Q12H  . loratadine  10 mg Oral Daily  . mannitol  12.5 g Intravenous Once  . mirtazapine  15 mg Oral QHS  . mometasone-formoterol  2 puff Inhalation BID  . nicotine  14 mg Transdermal Daily  . pantoprazole  40 mg  Oral Daily  . rosuvastatin  10 mg Oral QPM  . sodium chloride  3 mL Intravenous Q12H  . tiotropium  18 mcg Inhalation QHS   Continuous Infusions: . sodium chloride    . dextrose 5 % and 0.45% NaCl 25 mL/hr at 01/12/15 1415   PRN Meds:.sodium chloride, acetaminophen **OR** acetaminophen, albuterol, fentaNYL (SUBLIMAZE) injection, heparin, lactulose, LORazepam, morphine injection, morphine injection, ondansetron (ZOFRAN) IV, oxyCODONE, sodium chloride, traMADol   Data Review:   Micro Results No results found for this or any previous visit (from the past 240 hour(s)).  Radiology Reports Dg Chest 1 View  01/09/2015  CLINICAL DATA:  74 year old female with history of left hip fracture. Preoperative study. EXAM: CHEST 1 VIEW COMPARISON:  Chest x-ray 04/07/2014. FINDINGS: Emphysematous changes throughout the lungs bilaterally. No acute consolidative airspace disease. No pleural effusions. No definite suspicious appearing pulmonary nodules or masses. No evidence of pulmonary edema. Heart size is upper limits of normal. Upper mediastinal contours are within normal limits. Atherosclerosis in the thoracic aorta. IMPRESSION: 1. No radiographic evidence of acute cardiopulmonary disease. 2. Emphysema. 3. Atherosclerosis. Electronically Signed   By: Vinnie Langton M.D.   On: 01/09/2015 18:00   Dg Pelvis 1-2 Views  01/10/2015  CLINICAL DATA:  74 year old female status post left femur fracture repair. Initial encounter. EXAM: PELVIS - 1-2 VIEW COMPARISON:  Lennie Odor 07/2014. FINDINGS: AP view of the pelvis at 1141 hours. Proximal left femur arthroplasty now in place. The femoral head component appears normally aligned with the acetabulum on this AP view. Postoperative changes to the surrounding soft tissues including subcutaneous gas, overlying skin staples. Other visualized osseous structures appear stable. IMPRESSION: Left proximal femoral arthroplasty with no adverse features identified. Electronically Signed    By: Genevie Ann M.D.   On: 01/10/2015 12:59   Ct Head Wo Contrast  01/09/2015  CLINICAL DATA:  Multiple falls, bruise along the temporal area EXAM: CT HEAD WITHOUT CONTRAST TECHNIQUE:  Contiguous axial images were obtained from the base of the skull through the vertex without intravenous contrast. COMPARISON:  None. FINDINGS: There is no evidence of mass effect, midline shift, or extra-axial fluid collections. There is no evidence of a space-occupying lesion or intracranial hemorrhage. There is no evidence of a cortical-based area of acute infarction. There is generalized cerebral atrophy. There is periventricular white matter low attenuation likely secondary to microangiopathy. The ventricles and sulci are appropriate for the patient's age. The basal cisterns are patent. Visualized portions of the orbits are unremarkable. The visualized portions of the paranasal sinuses and mastoid air cells are unremarkable. Cerebrovascular atherosclerotic calcifications are noted. The osseous structures are unremarkable. IMPRESSION: 1. No acute intracranial pathology. 2. Chronic microvascular disease and cerebral atrophy. 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 EXAM: DG HIP (WITH OR WITHOUT PELVIS) 2-3V LEFT COMPARISON:  No priors. FINDINGS: Three views of the bony pelvis and left hip demonstrate an acute transcervical left femoral neck fracture which appears mildly comminuted. There is approximately 1.6 cm of proximal migration of the femur relative to the proximal fragment. Femoral head remains located. Small adjacent bony fragment immediately superior and lateral to the femoral head also noted. Bony pelvis itself appears intact, as does the visualized portions of the right proximal femur. IMPRESSION: 1.  Acute displaced minimally comminuted transcervical left femoral neck fracture, as above. Electronically Signed   By: Vinnie Langton M.D.   On: 01/09/2015 17:42     CBC  Recent Labs Lab 01/09/15 1731 01/11/15 0332 01/12/15 0519  WBC 8.5 8.5 10.9  HGB 13.0 11.8* 9.9*  HCT 38.7 36.1 30.4*  PLT 228 252 216  MCV 95.0 94.3 93.8  MCH 31.8 30.9 30.6  MCHC 33.5 32.8 32.6  RDW 15.8* 15.4* 15.1*    Chemistries   Recent Labs Lab 01/09/15 1731 01/11/15 0332 01/12/15 0519  NA 135 135 128*  K 3.9 4.2 4.3  CL 96* 101 93*  CO2 29 27 26   GLUCOSE 92 117* 90  BUN 49* 50* 64*  CREATININE 6.81* 6.85* 8.05*  CALCIUM 8.9 8.3* 7.5*  AST 30  --   --   ALT 17  --   --   ALKPHOS 71  --   --   BILITOT 0.2*  --   --    ------------------------------------------------------------------------------------------------------------------ estimated creatinine clearance is 6.1 mL/min (by C-G formula based on Cr of 8.05). ------------------------------------------------------------------------------------------------------------------ No results for input(s): HGBA1C in the last 72 hours. ------------------------------------------------------------------------------------------------------------------ No results for input(s): CHOL, HDL, LDLCALC, TRIG, CHOLHDL, LDLDIRECT in the last 72 hours. ------------------------------------------------------------------------------------------------------------------ No results for input(s): TSH, T4TOTAL, T3FREE, THYROIDAB in the last 72 hours.  Invalid input(s): FREET3 ------------------------------------------------------------------------------------------------------------------ No results for input(s): VITAMINB12, FOLATE, FERRITIN, TIBC, IRON, RETICCTPCT in the last 72 hours.  Coagulation profile  Recent Labs Lab 01/09/15 1731  INR 0.85    No results for input(s): DDIMER in the last 72 hours.  Cardiac Enzymes No results for input(s): CKMB,  TROPONINI, MYOGLOBIN in the last 168 hours.  Invalid input(s): CK ------------------------------------------------------------------------------------------------------------------ Invalid input(s): POCBNP    Assessment & Plan   Active Problems: 1.   Acute left femoral neck fracture  s/p repair, continue physical therapy  2. End-stage renal disease on peritoneal dialysis Now getting hemodialysis  access due to nursing home unable to do. Tunneled dialysis  3. Essential hypertension Continue  Avapro, hold Lasix due to hyponatremia   4. Chronic history of COPD no exacerbation Continue inhalers  As taking at home  5. Tobacco abuse disorder Counseled  regarding smoking cessation  6. hyperlipdemia continue simvastatin  7. Urinary retention: Bladder scan every 8 in and out cath as needed     Code Status Orders        Start     Ordered   01/09/15 2043  Full code   Continuous     01/09/15 2042    Advance Directive Documentation        Most Recent Value   Type of Advance Directive  Living will   Pre-existing out of facility DNR order (yellow form or pink MOST form)     "MOST" Form in Place?             Consults  Ortho, nephrology   DVT Prophylaxis    Heparin   Lab Results  Component Value Date   PLT 216 01/12/2015     Time Spent in minutes    16min.   Dustin Flock M.D on 01/12/2015 at 2:36 PM  Between 7am to 6pm - Pager - [phone]  After 6pm go to [email or web address] - password [email or web address] [facility]   Office  [phone]

## 2015-01-12 NOTE — Progress Notes (Signed)
Physical Therapy Treatment Patient Details Name: Lydia Clark MRN: [id number] DOB: 11-19-1940 Today's Date: 01/12/2015    History of Present Illness Lydia Clark is a 74 y.o. female with a known history of hypertension, end-stage renal disease on peritoneal dialysis, chronic COPD and continues to smoke is presenting to the ED after she sustained a fall. X-ray has revealed left femoral neck fracture and CT head is negative for any acute abnormalities. Patient is s/p left hip hemiarthroplasty. She is on posterior hip precautions at this time.     PT Comments    Pt presents with hx of CHF, A-fib, CKD, HTN, tobacco and cocaine abuse, and COPD. Examination reveals that pt is independent with bed mobility and transfers, and mod I ambulating 220 ft with multiple standing rest breaks (reference ambulation section for O2 sat response). Pt appears to have good functional strength with mobility, and is stable with his mobility. His greatest physical deficit is his activity tolerance secondary to his cardiopulmonary status. Pt will benefit from outpatient PT and/or outpatient pulmonary rehabilitation to address these deficits and return him home safely and reduce the risk of hospital readmission. Pt states that he is interested in outpatient therapy and could remain compliant and get a ride there. Secondary to pt not having much skilled PT need in the acute care setting, he will be d/c'd in house from PT services. Should further PT needs arise please issue new orders. Communication to be had with nurse regarding continuing to encourage him to walk and perform exercises program provided by PT.   This entire session was guided, instructed, and directly supervised by Greggory Stallion, DPT.  Follow Up Recommendations  SNF     Equipment Recommendations  Rolling walker with 5" wheels    Recommendations for Other Services       Precautions / Restrictions Precautions Precautions: Fall Restrictions Weight  Bearing Restrictions: Yes LLE Weight Bearing: Weight bearing as tolerated Other Position/Activity Restrictions: posterior hip precautions    Mobility  Bed Mobility Overal bed mobility: Needs Assistance Bed Mobility: Supine to Sit     Supine to sit: Min assist Sit to supine: Mod assist;+2 for physical assistance   General bed mobility comments: Pt requiring slightly more assist for bed mobility with more assist for trunk this afternoon. Once in sitting, pt c/o light headedness and fatigue. Denies diaphoresis or feeling of passing out, just how she has been feeling the past few days.  Sat EOB for 1 minute before returning to supine.   Transfers Overall transfer level:  (deferred until tomorrow)          Ambulation/Gait Ambulation/Gait assistance:  (deferred until tomorrow)               Stairs            Wheelchair Mobility    Modified Rankin (Stroke Patients Only)       Balance Overall balance assessment: History of Falls                                  Cognition Arousal/Alertness: Awake/alert Behavior During Therapy: WFL for tasks assessed/performed Overall Cognitive Status: Within Functional Limits for tasks assessed                      Exercises Other Exercises Other Exercises: Pt performed bilateral therex x 15 reps at min assist for facilitation of movement. Exercises performed: ankle pumps, quad  sets, SAQ, SLR, and hip ABD. Pt not showing too much pain during therex    General Comments        Pertinent Vitals/Pain Pain Assessment: No/denies pain    Home Living                      Prior Function            PT Goals (current goals can now be found in the care plan section) Acute Rehab PT Goals Patient Stated Goal: none stated PT Goal Formulation: With patient/family Time For Goal Achievement: 01/24/15 Potential to Achieve Goals: Fair Progress towards PT goals: Progressing toward goals     Frequency  BID    PT Plan Current plan remains appropriate    Co-evaluation             End of Session Equipment Utilized During Treatment: Gait belt Activity Tolerance: Patient limited by fatigue Patient left: in bed;with call bell/phone within reach;with bed alarm set;with family/visitor present     Time: 7902-4097 PT Time Calculation (min) (ACUTE ONLY): 11 min  Charges:                       G CodesJanyth Contes 08-Feb-2015, 4:34 PM  Janyth Contes, SPT. [phone]

## 2015-01-12 NOTE — Anesthesia Postprocedure Evaluation (Signed)
  Anesthesia Post-op Note  Patient: Lydia Clark  Procedure(s) Performed: Procedure(s): ARTHROPLASTY BIPOLAR HIP (HEMIARTHROPLASTY) (Left)  Anesthesia type:Spinal, MAC  Patient location: PACU  Post pain: Pain level controlled  Post assessment: Post-op Vital signs reviewed, Patient's Cardiovascular Status Stable, Respiratory Function Stable, Patent Airway and No signs of Nausea or vomiting  Post vital signs: Reviewed and stable  Last Vitals:  Filed Vitals:   01/12/15 0734  BP: 137/70  Pulse: 87  Temp: 36.6 C  Resp: 16    Level of consciousness: awake, alert  and patient cooperative  Complications: No apparent anesthesia complications

## 2015-01-13 ENCOUNTER — Encounter
Admission: EM | Disposition: A | Discharge status: Skilled Nursing Facility | Payer: Self-pay | Source: Home / Self Care | Attending: Internal Medicine

## 2015-01-13 HISTORY — PX: PERIPHERAL VASCULAR CATHETERIZATION: SHX172C

## 2015-01-13 LAB — CBC
HCT: 28 % — ABNORMAL LOW (ref 35.0–47.0)
Hemoglobin: 9.1 g/dL — ABNORMAL LOW (ref 12.0–16.0)
MCH: 30.6 pg (ref 26.0–34.0)
MCHC: 32.7 g/dL (ref 32.0–36.0)
MCV: 93.5 fL (ref 80.0–100.0)
PLATELETS: 237 10*3/uL (ref 150–440)
RBC: 2.99 MIL/uL — AB (ref 3.80–5.20)
RDW: 15.7 % — ABNORMAL HIGH (ref 11.5–14.5)
WBC: 10 10*3/uL (ref 3.6–11.0)

## 2015-01-13 LAB — BASIC METABOLIC PANEL
ANION GAP: 12 (ref 5–15)
BUN: 83 mg/dL — ABNORMAL HIGH (ref 6–20)
CALCIUM: 7.6 mg/dL — AB (ref 8.9–10.3)
CO2: 25 mmol/L (ref 22–32)
Chloride: 94 mmol/L — ABNORMAL LOW (ref 101–111)
Creatinine, Ser: 8.86 mg/dL — ABNORMAL HIGH (ref 0.44–1.00)
GFR, EST AFRICAN AMERICAN: 5 mL/min — AB (ref >60)
GFR, EST NON AFRICAN AMERICAN: 4 mL/min — AB (ref >60)
GLUCOSE: 96 mg/dL (ref 65–99)
POTASSIUM: 4.6 mmol/L (ref 3.5–5.1)
Sodium: 131 mmol/L — ABNORMAL LOW (ref 135–145)

## 2015-01-13 LAB — RENAL FUNCTION PANEL
ANION GAP: 13 (ref 5–15)
Albumin: 1.9 g/dL — ABNORMAL LOW (ref 3.5–5.0)
BUN: 89 mg/dL — ABNORMAL HIGH (ref 6–20)
CHLORIDE: 95 mmol/L — AB (ref 101–111)
CO2: 24 mmol/L (ref 22–32)
CREATININE: 9.15 mg/dL — AB (ref 0.44–1.00)
Calcium: 7.5 mg/dL — ABNORMAL LOW (ref 8.9–10.3)
GFR calc Af Amer: 4 mL/min — ABNORMAL LOW (ref >60)
GFR, EST NON AFRICAN AMERICAN: 4 mL/min — AB (ref >60)
Glucose, Bld: 88 mg/dL (ref 65–99)
Phosphorus: 8.1 mg/dL — ABNORMAL HIGH (ref 2.5–4.6)
Potassium: 4.7 mmol/L (ref 3.5–5.1)
Sodium: 132 mmol/L — ABNORMAL LOW (ref 135–145)

## 2015-01-13 SURGERY — DIALYSIS/PERMA CATHETER INSERTION
Anesthesia: Moderate Sedation

## 2015-01-13 MED ORDER — MIDAZOLAM HCL 2 MG/2ML IJ SOLN
INTRAMUSCULAR | Status: DC | PRN
  Administered 2015-01-13: 1 mg via INTRAVENOUS
  Administered 2015-01-13: 2 mg via INTRAVENOUS

## 2015-01-13 MED ORDER — EPOETIN ALFA 10000 UNIT/ML IJ SOLN
10000.0000 [IU] | Freq: Once | INTRAMUSCULAR | Status: AC
  Administered 2015-01-13: 10000 [IU] via INTRAVENOUS

## 2015-01-13 MED ORDER — HEPARIN (PORCINE) IN NACL 2-0.9 UNIT/ML-% IJ SOLN
INTRAMUSCULAR | Status: AC
  Filled 2015-01-13: qty 500

## 2015-01-13 MED ORDER — POLYETHYLENE GLYCOL 3350 17 G PO PACK
17.0000 g | PACK | Freq: Every day | ORAL | Status: DC
  Administered 2015-01-14: 17 g via ORAL
  Filled 2015-01-13: qty 1

## 2015-01-13 MED ORDER — MIDAZOLAM HCL 5 MG/5ML IJ SOLN
INTRAMUSCULAR | Status: AC
  Filled 2015-01-13: qty 5

## 2015-01-13 MED ORDER — FENTANYL CITRATE (PF) 100 MCG/2ML IJ SOLN
INTRAMUSCULAR | Status: AC
  Filled 2015-01-13: qty 2

## 2015-01-13 MED ORDER — SENNOSIDES-DOCUSATE SODIUM 8.6-50 MG PO TABS
1.0000 | ORAL_TABLET | Freq: Two times a day (BID) | ORAL | Status: DC
  Administered 2015-01-13 – 2015-01-14 (×2): 1 via ORAL
  Filled 2015-01-13 (×2): qty 1

## 2015-01-13 MED ORDER — HEPARIN SODIUM (PORCINE) 1000 UNIT/ML IJ SOLN
INTRAMUSCULAR | Status: AC
  Filled 2015-01-13: qty 1

## 2015-01-13 MED ORDER — FENTANYL CITRATE (PF) 100 MCG/2ML IJ SOLN
INTRAMUSCULAR | Status: DC | PRN
  Administered 2015-01-13 (×2): 50 ug via INTRAVENOUS

## 2015-01-13 MED ORDER — DEXTROSE 5 % IV SOLN
INTRAVENOUS | Status: AC
  Administered 2015-01-13: 13:00:00
  Filled 2015-01-13: qty 1.5

## 2015-01-13 MED ORDER — SODIUM CHLORIDE 0.9 % IV SOLN
INTRAVENOUS | Status: DC
Start: 2015-01-13 — End: 2015-01-14
  Administered 2015-01-13: 12:00:00 via INTRAVENOUS

## 2015-01-13 MED ORDER — HEPARIN SODIUM (PORCINE) 10000 UNIT/ML IJ SOLN
INTRAMUSCULAR | Status: AC
  Filled 2015-01-13: qty 1

## 2015-01-13 MED ORDER — LIDOCAINE-EPINEPHRINE (PF) 1 %-1:200000 IJ SOLN
INTRAMUSCULAR | Status: AC
  Filled 2015-01-13: qty 30

## 2015-01-13 SURGICAL SUPPLY — 6 items
CATH PALINDROME RT-P 15FX19CM (CATHETERS) ×3 IMPLANT
DRAPE INCISE IOBAN 66X45 STRL (DRAPES) IMPLANT
PACK ANGIOGRAPHY (CUSTOM PROCEDURE TRAY) ×3 IMPLANT
SUT MNCRL AB 4-0 PS2 18 (SUTURE) ×3 IMPLANT
SUT PROLENE 0 CT 1 30 (SUTURE) ×3 IMPLANT
TOWEL OR 17X26 4PK STRL BLUE (TOWEL DISPOSABLE) ×3 IMPLANT

## 2015-01-13 NOTE — Progress Notes (Signed)
Pt bladder scanned with 383 ml. MD Posey Pronto notified, per MD insert foley. Will continue to monitor.

## 2015-01-13 NOTE — Op Note (Signed)
OPERATIVE NOTE    PRE-OPERATIVE DIAGNOSIS: 1. ESRD 2. Hip fracture  POST-OPERATIVE DIAGNOSIS: same as above  PROCEDURE: 1. Ultrasound guidance for vascular access to the right internal jugular vein 2. Fluoroscopic guidance for placement of catheter 3. Placement of a 19 cm tip to cuff tunneled hemodialysis catheter via the right internal jugular vein  SURGEON: Leotis Pain, MD  ANESTHESIA:  Local/MCS  ESTIMATED BLOOD LOSS: minimal  FINDING(S): 1.  Patent right internal jugular vein  SPECIMEN(S):  None  INDICATIONS:   Lydia Clark is a 74 y.o. female who presents with renal failure and a recent hip fracture.  The SNF she is going to can not do PD, and she needs to start on HD.  The patient needs long term dialysis access for their ESRD, and a Permcath is necessary.  Risks and benefits are discussed and informed consent is obtained.    DESCRIPTION: After obtaining full informed written consent, the patient was brought back to the vascular suited. The patient's right neck and chest were sterilely prepped and draped in a sterile surgical field was created.  The right internal jugular vein was visualized with ultrasound and found to be patent. It was then accessed under direct ultrasound guidance and a permanent image was recorded. A wire was placed. After skin nick and dilatation, the peel-away sheath was placed over the wire. I then turned my attention to an area under the clavicle. Approximately 1-2 fingerbreadths below the clavicle a small counterincision was created and tunneled from the subclavicular incision to the access site. Using fluoroscopic guidance, a 19 centimeter tip to cuff tunneled hemodialysis catheter was selected, and tunneled from the subclavicular incision to the access site. It was then placed through the peel-away sheath and the peel-away sheath was removed. Using fluoroscopic guidance the catheter tips were parked in the right atrium. The appropriate distal connectors  were placed. It withdrew blood well and flushed easily with heparinized saline and a concentrated heparin solution was then placed. It was secured to the chest wall with 2 Prolene sutures. The access incision was closed single 4-0 Monocryl. A 4-0 Monocryl pursestring suture was placed around the exit site. Sterile dressings were placed. The patient tolerated the procedure well and was taken to the recovery room in stable condition.  COMPLICATIONS: None  CONDITION: Stable  [name]  01/13/2015, 1:19 PM

## 2015-01-13 NOTE — Progress Notes (Signed)
HD tx start 

## 2015-01-13 NOTE — Care Management (Signed)
CSW following for SNF. Planned permacath placement for hemodialysis. Son is concerned about his mother's status. He states she "just keeps getting worse". He states "she is withering away". He does not feel that she is ready for discharge to SNF today and asks to speak with a doctor. Dr. Posey Pronto has met with him and assessed patient. RNCM will continue to follow.

## 2015-01-13 NOTE — Progress Notes (Signed)
Physical Therapy Treatment Patient Details Name: Lydia Clark MRN: [id number] DOB: August 25, 1940 Today's Date: 01/13/2015    History of Present Illness Lydia Clark is a 74 y.o. female with a known history of hypertension, end-stage renal disease on peritoneal dialysis, chronic COPD and continues to smoke is presenting to the ED after she sustained a fall. X-ray has revealed left femoral neck fracture and CT head is negative for any acute abnormalities. Patient is s/p left hip hemiarthroplasty. She is on posterior hip precautions at this time.     PT Comments    Pt willing to perform therapy but she remains very limited by weakness and activity tolerance. Pt and her son believe this is due to not being able to eat adequately. She has progressed in her therex volume, however her transfers remain wobbly and are requiring increased assist this morning. Due to these deficits, she will continue to benefit from skilled PT in order to return to optimal PLOF. Attempt ambulation to chair this afternoon if strength permits it.   Follow Up Recommendations  SNF     Equipment Recommendations  Rolling walker with 5" wheels    Recommendations for Other Services       Precautions / Restrictions Precautions Precautions: Fall Restrictions Weight Bearing Restrictions: Yes LLE Weight Bearing: Weight bearing as tolerated Other Position/Activity Restrictions: posterior hip precautions    Mobility  Bed Mobility Overal bed mobility: Needs Assistance Bed Mobility: Supine to Sit     Supine to sit: Min assist Sit to supine: Mod assist;+2 for physical assistance   General bed mobility comments: Pt still requiring assist for trunk and LEs. Remains rather minor assist. She still needs cues on where to place hands  Transfers Overall transfer level: Needs assistance Equipment used: Rolling walker (2 wheeled) Transfers: Sit to/from Stand Sit to Stand: Mod assist         General transfer comment: Pt  requiring more assist to get into standing. She needs lots of cues to not bend over too much in order to maintain her hip precautions. She does not have the strength to fully stand up and can only perform stand for 10 seconds before needing to sit back down  Ambulation/Gait Ambulation/Gait assistance:  (Not appropriate secondary to weakness)               Stairs            Wheelchair Mobility    Modified Rankin (Stroke Patients Only)       Balance Overall balance assessment: History of Falls Sitting-balance support: Bilateral upper extremity supported Sitting balance-Leahy Scale: Fair Sitting balance - Comments: Pt only able to hold sitting balance for 10 seconds before listing backwards                            Cognition Arousal/Alertness: Awake/alert Behavior During Therapy: WFL for tasks assessed/performed Overall Cognitive Status: Within Functional Limits for tasks assessed                      Exercises Other Exercises Other Exercises: Pt performed bilateral therex x 17 reps at min assist for facilitation of movement. Exercises performed: ankle pumps, quad sets, SAQ, SLR, and hip ABD.  Other Exercises: Pt practiced 4 sit-to-stands with PT before fatiguing. She was provided ample rest inbetween sets of standing for 10 seconds. Pt continues to fatigue after short amounts of standing and does not tolerate further transfer training beyond  4 reps    General Comments        Pertinent Vitals/Pain Pain Assessment: No/denies pain    Home Living                      Prior Function            PT Goals (current goals can now be found in the care plan section) Acute Rehab PT Goals Patient Stated Goal: wishes to be able to eat PT Goal Formulation: With patient/family Time For Goal Achievement: 01/24/15 Potential to Achieve Goals: Fair Progress towards PT goals: PT to reassess next treatment    Frequency  BID    PT Plan Current  plan remains appropriate    Co-evaluation             End of Session Equipment Utilized During Treatment: Gait belt Activity Tolerance: Patient limited by fatigue Patient left:  (Left in bed due to procedure in AM)     Time: 6734-1937 PT Time Calculation (min) (ACUTE ONLY): 25 min  Charges:                       G CodesJanyth Contes January 17, 2015, 11:53 AM  Janyth Contes, SPT. [phone]

## 2015-01-13 NOTE — Progress Notes (Signed)
Dr. Lavetta Nielsen paged regarding bladder scan of 483. [place] for in and out cath. Will monitor.

## 2015-01-13 NOTE — Progress Notes (Signed)
Pt transported to hemodialysis, gcs 15 vss skin /d perm cath right thigh with dressing cdi capped and clamped. Family at bedside.

## 2015-01-13 NOTE — Progress Notes (Signed)
HD tx completed.

## 2015-01-13 NOTE — Progress Notes (Signed)
[facility]  ROUNDING NOTE   Subjective:   Tunneled catheter placed today.  Now getting hemodialysis. Tolerating treatment well. Mannitol given. BFR 250 DFR 500.   Objective:  Vital signs in last 24 hours:  Temp:  [98.1 F (36.7 C)-99.5 F (37.5 C)] 98.5 F (36.9 C) (11/09 1157) Pulse Rate:  [83-91] 83 (11/09 1200) Resp:  [16-20] 17 (11/09 1200) BP: (135-161)/(56-69) 135/68 mmHg (11/09 1200) SpO2:  [89 %-97 %] 89 % (11/09 1200) Weight:  [52.86 kg (116 lb 8.6 oz)-61.689 kg (136 lb)] 52.86 kg (116 lb 8.6 oz) (11/09 0401)  Weight change: 0.789 kg (1 lb 11.8 oz) Filed Weights   01/12/15 0449 01/13/15 0401 01/13/15 0401  Weight: 60.9 kg (134 lb 4.2 oz) 61.689 kg (136 lb) 52.86 kg (116 lb 8.6 oz)    Intake/Output: I/O last 3 completed shifts: In: 240 [P.O.:240] Out: 900 [Urine:900]   Intake/Output this shift:     Physical Exam: General: NAD  Head: Normocephalic, atraumatic. Moist oral mucosal membranes  Eyes: Anicteric, PERRL  Neck: Supple, trachea midline  Lungs:  Clear to auscultation  Heart: Regular rate and rhythm  Abdomen:  Soft, nontender, +PD catheter  Extremities: no peripheral edema  Neurologic: More alert  Skin: No lesions  Access PD catheter, RIJ permcath 11/9 Dr. Lucky Clark    Basic Metabolic Panel:  Recent Labs Lab 01/09/15 1731 01/11/15 0332 01/12/15 0519 01/13/15 0532  NA 135 135 128* 131*  K 3.9 4.2 4.3 4.6  CL 96* 101 93* 94*  CO2 29 27 26 25   GLUCOSE 92 117* 90 96  BUN 49* 50* 64* 83*  CREATININE 6.81* 6.85* 8.05* 8.86*  CALCIUM 8.9 8.3* 7.5* 7.6*    Liver Function Tests:  Recent Labs Lab 01/09/15 1731  AST 30  ALT 17  ALKPHOS 71  BILITOT 0.2*  PROT 6.5  ALBUMIN 2.9*   No results for input(s): LIPASE, AMYLASE in the last 168 hours. No results for input(s): AMMONIA in the last 168 hours.  CBC:  Recent Labs Lab 01/09/15 1731 01/11/15 0332 01/12/15 0519 01/13/15 0532  WBC 8.5 8.5 10.9 10.0  HGB 13.0 11.8* 9.9*  9.1*  HCT 38.7 36.1 30.4* 28.0*  MCV 95.0 94.3 93.8 93.5  PLT 228 252 216 237    Cardiac Enzymes: No results for input(s): CKTOTAL, CKMB, CKMBINDEX, TROPONINI in the last 168 hours.  BNP: Invalid input(s): POCBNP  CBG:  Recent Labs Lab 01/12/15 1134  GLUCAP 78    Microbiology: Results for orders placed or performed during the hospital encounter of 07/01/12  Surgical pcr screen     Status: None   Collection Time: 07/01/12 11:37 AM  Result Value Ref Range Status   MRSA, PCR NEGATIVE NEGATIVE Final   Staphylococcus aureus NEGATIVE NEGATIVE Final    Comment:        The Xpert SA Assay (FDA approved for NASAL specimens in patients over 40 years of age), is one component of a comprehensive surveillance program.  Test performance has been validated by [employer] for patients greater than or equal to 49 year old. It is not intended to diagnose infection nor to guide or monitor treatment.    Coagulation Studies: No results for input(s): LABPROT, INR in the last 72 hours.  Urinalysis: No results for input(s): COLORURINE, LABSPEC, PHURINE, GLUCOSEU, HGBUR, BILIRUBINUR, KETONESUR, PROTEINUR, UROBILINOGEN, NITRITE, LEUKOCYTESUR in the last 72 hours.  Invalid input(s): APPERANCEUR    Imaging: No results found.   Medications:   . sodium chloride    .  sodium chloride 10 mL/hr at 01/13/15 1212  . dextrose 5 % and 0.45% NaCl 75 mL/hr at 01/13/15 1114   . aspirin EC  81 mg Oral Daily  . Chlorhexidine Gluconate Cloth  6 each Topical Once  . cholecalciferol  1,000 Units Oral Daily  . dialysis solution 2.5% low-MG/low-CA   Intraperitoneal Q24H  . feeding supplement (ENSURE ENLIVE)  237 mL Oral BID BM  . gentamicin ointment   Topical Daily  . heparin subcutaneous  5,000 Units Subcutaneous Q12H  . loratadine  10 mg Oral Daily  . mannitol  12.5 g Intravenous Once  . mirtazapine  15 mg Oral QHS  . mometasone-formoterol  2 puff Inhalation BID  . nicotine  14 mg  Transdermal Daily  . pantoprazole  40 mg Oral Daily  . polyethylene glycol  17 g Oral Daily  . rosuvastatin  10 mg Oral QPM  . senna-docusate  1 tablet Oral BID  . sodium chloride  3 mL Intravenous Q12H  . tiotropium  18 mcg Inhalation QHS   sodium chloride, acetaminophen **OR** acetaminophen, albuterol, fentaNYL (SUBLIMAZE) injection, heparin, lactulose, LORazepam, morphine injection, morphine injection, ondansetron (ZOFRAN) IV, oxyCODONE, sodium chloride, traMADol  Assessment/ Plan:  Lydia Clark is a 74 y.o. female withe renal disease on peritoneal dialysis, hypertension, COPD, GERD, hyperlipidemia, generalized anxiety disorder, history depression, anemia of chronic kidney disease, secondary hyperparathyroidism and osteopenia.  [place] PD   1. End Stage Renal Disease: tolerating PD well. However now going to SNF, [facility]. Cannot do PD there and transitioned to HD through permcath She will need to be able to sit in a chair for four hours before discharge.  Plan on dialysis again tomorrow.  Outpatient schedule for TTS at [facility].   2. Anemia of chronic kidney disease: hemoglobin at goal. 9.1 - epo with treatment: 10000 units.   3. Secondary Hyperparathyroidism: calcium and phosphorus at goal. PTH at goal as outpatient.  - Calcitriol - No binders.   4. Hypertension: not at goal.  - home regimen of valsartan and furosemide. Hold all agents due to hypotension and dizziness.   5. Nutrition: albumin 2.9. Not eating at home. Suspect depression related.  -  Mirtazapine.  - appreciate dietician input.   6. Hyponatremia: sodium of 131. May be playing a role in mental status. Believe this is nutrition related. Liberalize diet - Neurology consult for decline in mental status which has been going for last few months.      LOS: Lydia Clark, Lydia Clark 11/9/20162:22 PM

## 2015-01-13 NOTE — Progress Notes (Signed)
PT Cancellation Note  Patient Details Name: EMMAJO BENNETTE MRN: [id number] DOB: May 22, 1940   Cancelled Treatment:    Reason Eval/Treat Not Completed: Other (comment) (See PT note for further details ) PT attempted this afternoon but pt out of room for hemodialysis. Will attempt at later time/date when available.    Janyth Contes 01/13/2015, 3:39 PM Janyth Contes, SPT. [phone]

## 2015-01-13 NOTE — Progress Notes (Signed)
Post hd tx 

## 2015-01-13 NOTE — Progress Notes (Signed)
Pre-hd tx 

## 2015-01-13 NOTE — Progress Notes (Signed)
Per MD patient is not medically stable for D/C today. Doug admissions coordinator at [facility] is aware of above. Plan is for patient to D/C to [facility] when medically stable. Clinical Social Worker (CSW) will continue to follow and assist as needed.   Blima Rich, [place] [phone]

## 2015-01-13 NOTE — Progress Notes (Signed)
[place] at [facility]                                                                                                                                                                                            Patient Demographics   Lydia Clark, is a 74 y.o. female, DOB - 08-22-1940, KAJ:[id number]  Admit date - 01/09/2015   Admitting Physician Claud Kelp, MD  Outpatient Primary MD for the patient is HAWKINS,EDWARD L, MD   LOS - 4  Subjective: son very upset that patient did not have vascular access yesterday. He states that his mom hasn't eaten and is now very weak and unable to get out of bed. Patient continues to have issues with urinary retention    Review of Systems:   CONSTITUTIONAL: No documented fever. Positive fatigue and weakness. No weight gain, no weight loss.  EYES: No blurry or double vision.  ENT: No tinnitus. No postnasal drip. No redness of the oropharynx.  RESPIRATORY: No cough, no wheeze, no hemoptysis. No dyspnea.  CARDIOVASCULAR: No chest pain. No orthopnea. No palpitations. No syncope.  GASTROINTESTINAL: No nausea, no vomiting or diarrhea. No abdominal pain. No melena or hematochezia.  GENITOURINARY: No dysuria or hematuria.  ENDOCRINE: No polyuria or nocturia. No heat or cold intolerance.  HEMATOLOGY: No anemia. No bruising. No bleeding.  INTEGUMENTARY: No rashes. No lesions.  MUSCULOSKELETAL: No arthritis. No swelling. No gout. + hip pain NEUROLOGIC: No numbness, tingling, or ataxia. No seizure-type activity.  PSYCHIATRIC: No anxiety. No insomnia. No ADD.    Vitals:   Filed Vitals:   01/13/15 1400 01/13/15 1430 01/13/15 1500 01/13/15 1530  BP: 137/70 140/70 111/63 105/58  Pulse: 82 84 87 88  Temp:  98.6 F (37 C)    TempSrc:  Oral    Resp: 16 23 14 22   Height:      Weight:  53.8 kg (118 lb 9.7 oz)    SpO2: 94% 97% 100% 100%    Wt Readings from Last 3 Encounters:  01/13/15 53.8 kg (118 lb 9.7 oz)   07/22/14 59.421 kg (131 lb)  06/16/14 58.968 kg (130 lb)     Intake/Output Summary (Last 24 hours) at 01/13/15 1544 Last data filed at 01/13/15 0622  Gross per 24 hour  Intake    240 ml  Output    500 ml  Net   -260 ml    Physical Exam:   GENERAL: Pleasant-appearing in no apparent distress.  HEAD, EYES, EARS, NOSE AND THROAT: Atraumatic, normocephalic. Extraocular muscles are intact. Pupils equal and reactive to light. Sclerae anicteric. No conjunctival injection.  No oro-pharyngeal erythema.  NECK: Supple. There is no jugular venous distention. No bruits, no lymphadenopathy, no thyromegaly.  HEART: Regular rate and rhythm,. No murmurs, no rubs, no clicks.  LUNGS: Clear to auscultation bilaterally. No rales or rhonchi. No wheezes.  ABDOMEN: Soft, flat, nontender, nondistended. Has good bowel sounds. No hepatosplenomegaly appreciated.  EXTREMITIES: No evidence of any cyanosis, clubbing, or peripheral edema.  +2 pedal and radial pulses bilaterally.  NEUROLOGIC: The patient is alert, awake, and oriented x3 with no focal motor or sensory deficits appreciated bilaterally.  SKIN: Moist and warm with no rashes appreciated.  Psych: Not anxious, depressed LN: No inguinal LN enlargement    Antibiotics   Anti-infectives    Start     Dose/Rate Route Frequency Ordered Stop   01/13/15 1222  dextrose 5 % with cefUROXime (ZINACEF) ADS Med    Comments:  jarrett, cathy: cabinet override      01/13/15 1222 01/13/15 1258   01/10/15 1400  ceFAZolin (ANCEF) IVPB 1 g/50 mL premix     1 g 100 mL/hr over 30 Minutes Intravenous 3 times per day 01/10/15 1326 01/11/15 0552      Medications   Scheduled Meds: . aspirin EC  81 mg Oral Daily  . Chlorhexidine Gluconate Cloth  6 each Topical Once  . cholecalciferol  1,000 Units Oral Daily  . dialysis solution 2.5% low-MG/low-CA   Intraperitoneal Q24H  . feeding supplement (ENSURE ENLIVE)  237 mL Oral BID BM  . gentamicin ointment   Topical Daily  .  heparin subcutaneous  5,000 Units Subcutaneous Q12H  . loratadine  10 mg Oral Daily  . mirtazapine  15 mg Oral QHS  . mometasone-formoterol  2 puff Inhalation BID  . nicotine  14 mg Transdermal Daily  . pantoprazole  40 mg Oral Daily  . polyethylene glycol  17 g Oral Daily  . rosuvastatin  10 mg Oral QPM  . senna-docusate  1 tablet Oral BID  . sodium chloride  3 mL Intravenous Q12H  . tiotropium  18 mcg Inhalation QHS   Continuous Infusions: . sodium chloride    . sodium chloride 10 mL/hr at 01/13/15 1212  . dextrose 5 % and 0.45% NaCl 75 mL/hr at 01/13/15 1114   PRN Meds:.sodium chloride, acetaminophen **OR** acetaminophen, albuterol, fentaNYL (SUBLIMAZE) injection, heparin, lactulose, LORazepam, morphine injection, morphine injection, ondansetron (ZOFRAN) IV, oxyCODONE, sodium chloride, traMADol   Data Review:   Micro Results No results found for this or any previous visit (from the past 240 hour(s)).  Radiology Reports Dg Chest 1 View  01/09/2015  CLINICAL DATA:  74 year old female with history of left hip fracture. Preoperative study. EXAM: CHEST 1 VIEW COMPARISON:  Chest x-ray 04/07/2014. FINDINGS: Emphysematous changes throughout the lungs bilaterally. No acute consolidative airspace disease. No pleural effusions. No definite suspicious appearing pulmonary nodules or masses. No evidence of pulmonary edema. Heart size is upper limits of normal. Upper mediastinal contours are within normal limits. Atherosclerosis in the thoracic aorta. IMPRESSION: 1. No radiographic evidence of acute cardiopulmonary disease. 2. Emphysema. 3. Atherosclerosis. Electronically Signed   By: Vinnie Langton M.D.   On: 01/09/2015 18:00   Dg Pelvis 1-2 Views  01/10/2015  CLINICAL DATA:  74 year old female status post left femur fracture repair. Initial encounter. EXAM: PELVIS - 1-2 VIEW COMPARISON:  Lennie Odor 07/2014. FINDINGS: AP view of the pelvis at 1141 hours. Proximal left femur arthroplasty now in place.  The femoral head component appears normally aligned with the acetabulum on this AP view. Postoperative  changes to the surrounding soft tissues including subcutaneous gas, overlying skin staples. Other visualized osseous structures appear stable. IMPRESSION: Left proximal femoral arthroplasty with no adverse features identified. Electronically Signed   By: Genevie Ann M.D.   On: 01/10/2015 12:59   Ct Head Wo Contrast  01/09/2015  CLINICAL DATA:  Multiple falls, bruise along the temporal area EXAM: CT HEAD WITHOUT CONTRAST TECHNIQUE: Contiguous axial images were obtained from the base of the skull through the vertex without intravenous contrast. COMPARISON:  None. FINDINGS: There is no evidence of mass effect, midline shift, or extra-axial fluid collections. There is no evidence of a space-occupying lesion or intracranial hemorrhage. There is no evidence of a cortical-based area of acute infarction. There is generalized cerebral atrophy. There is periventricular white matter low attenuation likely secondary to microangiopathy. The ventricles and sulci are appropriate for the patient's age. The basal cisterns are patent. Visualized portions of the orbits are unremarkable. The visualized portions of the paranasal sinuses and mastoid air cells are unremarkable. Cerebrovascular atherosclerotic calcifications are noted. The osseous structures are unremarkable. IMPRESSION: 1. No acute intracranial pathology. 2. Chronic microvascular disease and cerebral atrophy. 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 EXAM: DG HIP (WITH OR WITHOUT PELVIS) 2-3V LEFT COMPARISON:  No priors. FINDINGS: Three views of the bony pelvis and left hip demonstrate an acute transcervical left femoral neck fracture which appears  mildly comminuted. There is approximately 1.6 cm of proximal migration of the femur relative to the proximal fragment. Femoral head remains located. Small adjacent bony fragment immediately superior and lateral to the femoral head also noted. Bony pelvis itself appears intact, as does the visualized portions of the right proximal femur. IMPRESSION: 1. Acute displaced minimally comminuted transcervical left femoral neck fracture, as above. Electronically Signed   By: Vinnie Langton M.D.   On: 01/09/2015 17:42     CBC  Recent Labs Lab 01/09/15 1731 01/11/15 0332 01/12/15 0519 01/13/15 0532  WBC 8.5 8.5 10.9 10.0  HGB 13.0 11.8* 9.9* 9.1*  HCT 38.7 36.1 30.4* 28.0*  PLT 228 252 216 237  MCV 95.0 94.3 93.8 93.5  MCH 31.8 30.9 30.6 30.6  MCHC 33.5 32.8 32.6 32.7  RDW 15.8* 15.4* 15.1* 15.7*    Chemistries   Recent Labs Lab 01/09/15 1731 01/11/15 0332 01/12/15 0519 01/13/15 0532  NA 135 135 128* 131*  K 3.9 4.2 4.3 4.6  CL 96* 101 93* 94*  CO2 29 27 26 25   GLUCOSE 92 117* 90 96  BUN 49* 50* 64* 83*  CREATININE 6.81* 6.85* 8.05* 8.86*  CALCIUM 8.9 8.3* 7.5* 7.6*  AST 30  --   --   --   ALT 17  --   --   --   ALKPHOS 71  --   --   --   BILITOT 0.2*  --   --   --    ------------------------------------------------------------------------------------------------------------------ estimated creatinine clearance is 4.9 mL/min (by C-G formula based on Cr of 8.86). ------------------------------------------------------------------------------------------------------------------ No results for input(s): HGBA1C in the last 72 hours. ------------------------------------------------------------------------------------------------------------------ No results for input(s): CHOL, HDL, LDLCALC, TRIG, CHOLHDL, LDLDIRECT in the last 72 hours. ------------------------------------------------------------------------------------------------------------------ No results for input(s): TSH,  T4TOTAL, T3FREE, THYROIDAB in the last 72 hours.  Invalid input(s): FREET3 ------------------------------------------------------------------------------------------------------------------  No results for input(s): VITAMINB12, FOLATE, FERRITIN, TIBC, IRON, RETICCTPCT in the last 72 hours.  Coagulation profile  Recent Labs Lab 01/09/15 1731  INR 0.85    No results for input(s): DDIMER in the last 72 hours.  Cardiac Enzymes No results for input(s): CKMB, TROPONINI, MYOGLOBIN in the last 168 hours.  Invalid input(s): CK ------------------------------------------------------------------------------------------------------------------ Invalid input(s): POCBNP    Assessment & Plan   Active Problems: 1.   Acute left femoral neck fracture  s/p repair, continue physical therapy  2. End-stage renal disease on peritoneal dialysis Patient awaiting hemodialysis access then will be dialyzed  3. Essential hypertension Continue  Avapro, hold Lasix due to hyponatremia   4. Chronic history of COPD no exacerbation Continue inhalers  As taking at home  5. Tobacco abuse disorder Counseled  regarding smoking cessation  6. hyperlipdemia continue simvastatin  7. Urinary retention: Foley catheter     Code Status Orders        Start     Ordered   01/09/15 2043  Full code   Continuous     01/09/15 2042    Advance Directive Documentation        Most Recent Value   Type of Advance Directive  Living will   Pre-existing out of facility DNR order (yellow form or pink MOST form)     "MOST" Form in Place?          Dispo in the next 1-2 days   Consults  Ortho, nephrology   DVT Prophylaxis    Heparin   Lab Results  Component Value Date   PLT 237 01/13/2015     Time Spent in minutes    61min.

## 2015-01-13 NOTE — Care Management Important Message (Signed)
Important Message  Patient Details  Name: Lydia Clark MRN: [id number] Date of Birth: [birth year]   Medicare Important Message Given:  Yes-third notification given    Marshell Garfinkel, RN 01/13/2015, 7:54 AM

## 2015-01-14 ENCOUNTER — Encounter: Payer: Self-pay | Admitting: Vascular Surgery

## 2015-01-14 DIAGNOSIS — R410 Disorientation, unspecified: Secondary | ICD-10-CM | POA: Diagnosis not present

## 2015-01-14 DIAGNOSIS — Z992 Dependence on renal dialysis: Secondary | ICD-10-CM | POA: Diagnosis not present

## 2015-01-14 DIAGNOSIS — R339 Retention of urine, unspecified: Secondary | ICD-10-CM | POA: Diagnosis not present

## 2015-01-14 DIAGNOSIS — I120 Hypertensive chronic kidney disease with stage 5 chronic kidney disease or end stage renal disease: Secondary | ICD-10-CM | POA: Diagnosis not present

## 2015-01-14 DIAGNOSIS — Z471 Aftercare following joint replacement surgery: Secondary | ICD-10-CM | POA: Diagnosis not present

## 2015-01-14 DIAGNOSIS — E43 Unspecified severe protein-calorie malnutrition: Secondary | ICD-10-CM | POA: Diagnosis not present

## 2015-01-14 DIAGNOSIS — K219 Gastro-esophageal reflux disease without esophagitis: Secondary | ICD-10-CM | POA: Diagnosis not present

## 2015-01-14 DIAGNOSIS — Z9181 History of falling: Secondary | ICD-10-CM | POA: Diagnosis not present

## 2015-01-14 DIAGNOSIS — F419 Anxiety disorder, unspecified: Secondary | ICD-10-CM | POA: Diagnosis not present

## 2015-01-14 DIAGNOSIS — Z853 Personal history of malignant neoplasm of breast: Secondary | ICD-10-CM | POA: Diagnosis not present

## 2015-01-14 DIAGNOSIS — E785 Hyperlipidemia, unspecified: Secondary | ICD-10-CM | POA: Diagnosis not present

## 2015-01-14 DIAGNOSIS — E349 Endocrine disorder, unspecified: Secondary | ICD-10-CM | POA: Diagnosis not present

## 2015-01-14 DIAGNOSIS — S72002A Fracture of unspecified part of neck of left femur, initial encounter for closed fracture: Secondary | ICD-10-CM | POA: Diagnosis not present

## 2015-01-14 DIAGNOSIS — Z96642 Presence of left artificial hip joint: Secondary | ICD-10-CM | POA: Diagnosis not present

## 2015-01-14 DIAGNOSIS — M1990 Unspecified osteoarthritis, unspecified site: Secondary | ICD-10-CM | POA: Diagnosis not present

## 2015-01-14 DIAGNOSIS — R195 Other fecal abnormalities: Secondary | ICD-10-CM | POA: Diagnosis not present

## 2015-01-14 DIAGNOSIS — S72032A Displaced midcervical fracture of left femur, initial encounter for closed fracture: Secondary | ICD-10-CM | POA: Diagnosis not present

## 2015-01-14 DIAGNOSIS — D509 Iron deficiency anemia, unspecified: Secondary | ICD-10-CM | POA: Diagnosis not present

## 2015-01-14 DIAGNOSIS — J449 Chronic obstructive pulmonary disease, unspecified: Secondary | ICD-10-CM | POA: Diagnosis not present

## 2015-01-14 DIAGNOSIS — D631 Anemia in chronic kidney disease: Secondary | ICD-10-CM | POA: Diagnosis not present

## 2015-01-14 DIAGNOSIS — N2581 Secondary hyperparathyroidism of renal origin: Secondary | ICD-10-CM | POA: Diagnosis not present

## 2015-01-14 DIAGNOSIS — I10 Essential (primary) hypertension: Secondary | ICD-10-CM | POA: Diagnosis not present

## 2015-01-14 DIAGNOSIS — N186 End stage renal disease: Secondary | ICD-10-CM | POA: Diagnosis not present

## 2015-01-14 LAB — HEPATITIS B SURFACE ANTIGEN: HEP B S AG: NEGATIVE

## 2015-01-14 LAB — CBC
HEMATOCRIT: 28.7 % — AB (ref 35.0–47.0)
HEMOGLOBIN: 9.6 g/dL — AB (ref 12.0–16.0)
MCH: 31.5 pg (ref 26.0–34.0)
MCHC: 33.2 g/dL (ref 32.0–36.0)
MCV: 94.7 fL (ref 80.0–100.0)
Platelets: 266 10*3/uL (ref 150–440)
RBC: 3.03 MIL/uL — AB (ref 3.80–5.20)
RDW: 16 % — AB (ref 11.5–14.5)
WBC: 9.2 10*3/uL (ref 3.6–11.0)

## 2015-01-14 LAB — HEPATITIS B SURFACE ANTIBODY,QUALITATIVE: HEP B S AB: REACTIVE

## 2015-01-14 LAB — RENAL FUNCTION PANEL
ALBUMIN: 1.7 g/dL — AB (ref 3.5–5.0)
ANION GAP: 10 (ref 5–15)
BUN: 55 mg/dL — ABNORMAL HIGH (ref 6–20)
CHLORIDE: 97 mmol/L — AB (ref 101–111)
CO2: 29 mmol/L (ref 22–32)
Calcium: 7.6 mg/dL — ABNORMAL LOW (ref 8.9–10.3)
Creatinine, Ser: 5.84 mg/dL — ABNORMAL HIGH (ref 0.44–1.00)
GFR, EST AFRICAN AMERICAN: 8 mL/min — AB (ref >60)
GFR, EST NON AFRICAN AMERICAN: 7 mL/min — AB (ref >60)
Glucose, Bld: 151 mg/dL — ABNORMAL HIGH (ref 65–99)
PHOSPHORUS: 3.8 mg/dL (ref 2.5–4.6)
POTASSIUM: 4.1 mmol/L (ref 3.5–5.1)
Sodium: 136 mmol/L (ref 135–145)

## 2015-01-14 LAB — HEPATITIS B CORE ANTIBODY, TOTAL: Hep B Core Total Ab: NEGATIVE

## 2015-01-14 MED ORDER — BISACODYL 10 MG RE SUPP
10.0000 mg | Freq: Once | RECTAL | Status: AC
  Administered 2015-01-14: 10 mg via RECTAL
  Filled 2015-01-14 (×2): qty 1

## 2015-01-14 MED ORDER — OXYCODONE HCL 5 MG PO TABS: 5.0000 mg | ORAL_TABLET | ORAL | Status: DC | PRN

## 2015-01-14 MED ORDER — EPOETIN ALFA 10000 UNIT/ML IJ SOLN
10000.0000 [IU] | Freq: Once | INTRAMUSCULAR | Status: AC
  Administered 2015-01-14: 10000 [IU] via INTRAVENOUS
  Filled 2015-01-14: qty 1

## 2015-01-14 MED ORDER — POLYETHYLENE GLYCOL 3350 17 G PO PACK: 17.0000 g | PACK | Freq: Every day | ORAL | Status: DC

## 2015-01-14 MED ORDER — ENSURE ENLIVE PO LIQD: 237.0000 mL | Freq: Two times a day (BID) | ORAL | Status: DC

## 2015-01-14 MED ORDER — NICOTINE 14 MG/24HR TD PT24: 14.0000 mg | MEDICATED_PATCH | Freq: Every day | TRANSDERMAL | Status: DC

## 2015-01-14 MED ORDER — LORAZEPAM 1 MG PO TABS: 1.0000 mg | ORAL_TABLET | Freq: Every evening | ORAL | Status: DC | PRN

## 2015-01-14 MED ORDER — SENNOSIDES-DOCUSATE SODIUM 8.6-50 MG PO TABS: 1.0000 | ORAL_TABLET | Freq: Two times a day (BID) | ORAL | Status: DC

## 2015-01-14 MED ORDER — HEPARIN SODIUM (PORCINE) 5000 UNIT/ML IJ SOLN
5000.0000 [IU] | Freq: Two times a day (BID) | INTRAMUSCULAR | Status: DC
Start: 2015-01-14 — End: 2015-02-23

## 2015-01-14 MED ORDER — ACETAMINOPHEN 325 MG PO TABS: 650.0000 mg | ORAL_TABLET | Freq: Four times a day (QID) | ORAL | Status: DC | PRN

## 2015-01-14 NOTE — Progress Notes (Signed)
HD TX Termination 

## 2015-01-14 NOTE — Discharge Summary (Signed)
Lydia Clark, 74 y.o., DOB [birth year], MRN [id number]. Admission date: 01/09/2015 Discharge Date 01/14/2015 Primary MD Lydia Bogus, MD Admitting Physician Lydia Kelp, MD  Admission Diagnosis  Closed left hip fracture, initial encounter Lydia Clark) [S72.002A]  Discharge Diagnosis   Active Problems:   Femoral neck fracture, left, closed, initial encounter End-stage renal disease was on paratonia L dialysis now on hemodialysis Generalized weakness Urinary retention Chronic COPD without any evidence of exasperation Hyperlipidemia Urinary retention will need voiding trials Tobacco abuse        Hospital Course  Lydia Clark is a 74 y.o. female with a known history of hypertension, end-stage renal disease on peritoneal dialysis, chronic COPD and continues to smoke is presenting to the ED after she sustained a fall. X-ray has revealed left femoral neck fracture. Patient was admitted and was seen by orthopedic surgery Dr. Daine Clark who performed a left hip arthroplasty. Patient tolerated the procedure without any complications. Postop patient was very weak she also was noted to have urinary retention and has required a Foley. She will need to have Foley removed to see if she'll be able to void in the nursing facility. She may need an out catheter until she is able to ward. If symptoms persistent symptoms of urinary retention then she'll need a urology evaluation as outpatient. He should also was on paratonia on dialysis prior to being admitted to the hospital. However due to her going to a nursing facility they will be unable to do appear to know dialysis therefore she had a vascular access put by Dr. dew. She has received dialysis. This will be continued as outpatient.            Consults  nephrology, neurology, orthopedics  Significant Tests:  See full reports for all details      Dg Chest 1 View  01/09/2015  CLINICAL DATA:  74 year old female with history of left hip fracture.  Preoperative study. EXAM: CHEST 1 VIEW COMPARISON:  Chest x-ray 04/07/2014. FINDINGS: Emphysematous changes throughout the lungs bilaterally. No acute consolidative airspace disease. No pleural effusions. No definite suspicious appearing pulmonary nodules or masses. No evidence of pulmonary edema. Heart size is upper limits of normal. Upper mediastinal contours are within normal limits. Atherosclerosis in the thoracic aorta. IMPRESSION: 1. No radiographic evidence of acute cardiopulmonary disease. 2. Emphysema. 3. Atherosclerosis. Electronically Signed   By: Lydia Clark M.D.   On: 01/09/2015 18:00   Dg Pelvis 1-2 Views  01/10/2015  CLINICAL DATA:  74 year old female status post left femur fracture repair. Initial encounter. EXAM: PELVIS - 1-2 VIEW COMPARISON:  Lydia Clark 07/2014. FINDINGS: AP view of the pelvis at 1141 hours. Proximal left femur arthroplasty now in place. The femoral head component appears normally aligned with the acetabulum on this AP view. Postoperative changes to the surrounding soft tissues including subcutaneous gas, overlying skin staples. Other visualized osseous structures appear stable. IMPRESSION: Left proximal femoral arthroplasty with no adverse features identified. Electronically Signed   By: Lydia Clark M.D.   On: 01/10/2015 12:59   Ct Head Wo Contrast  01/09/2015  CLINICAL DATA:  Multiple falls, bruise along the temporal area EXAM: CT HEAD WITHOUT CONTRAST TECHNIQUE: Contiguous axial images were obtained from the base of the skull through the vertex without intravenous contrast. COMPARISON:  None. FINDINGS: There is no evidence of mass effect, midline shift, or extra-axial fluid collections. There is no evidence of a space-occupying lesion or intracranial hemorrhage. There is no evidence of a cortical-based area of acute infarction. There  is generalized cerebral atrophy. There is periventricular white matter low attenuation likely secondary to microangiopathy. The ventricles and  sulci are appropriate for the patient's age. The basal cisterns are patent. Visualized portions of the orbits are unremarkable. The visualized portions of the paranasal sinuses and mastoid air cells are unremarkable. Cerebrovascular atherosclerotic calcifications are noted. The osseous structures are unremarkable. IMPRESSION: 1. No acute intracranial pathology. 2. Chronic microvascular disease and cerebral atrophy. 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 EXAM: DG HIP (WITH OR WITHOUT PELVIS) 2-3V LEFT COMPARISON:  No priors. FINDINGS: Three views of the bony pelvis and left hip demonstrate an acute transcervical left femoral neck fracture which appears mildly comminuted. There is approximately 1.6 cm of proximal migration of the femur relative to the proximal fragment. Femoral head remains located. Small adjacent bony fragment immediately superior and lateral to the femoral head also noted. Bony pelvis itself appears intact, as does the visualized portions of the right proximal femur. IMPRESSION: 1. Acute displaced minimally comminuted transcervical left femoral neck fracture, as above. Electronically Signed   By: Lydia Clark M.D.   On: 01/09/2015 17:42       Today   Subjective:   Lydia Clark sitting in a chair was able to walk with physical therapy  Objective:   Blood pressure 133/60, pulse 88, temperature 98.5 F (36.9 C), temperature source Oral, resp. rate 18, height 5\' 7"  (1.702 m), weight 64.501 kg (142 lb 3.2 oz), SpO2 93 %.  .  Intake/Output Summary (Last 24 hours) at 01/14/15 1045 Last data filed at 01/14/15 0900  Gross per 24 hour  Intake    240 ml  Output    500 ml  Net   -260 ml    Exam VITAL SIGNS: Blood pressure 133/60, pulse 88, temperature 98.5 F  (36.9 C), temperature source Oral, resp. rate 18, height 5\' 7"  (1.702 m), weight 64.501 kg (142 lb 3.2 oz), SpO2 93 %.  GENERAL:  74 y.o.-year-old patient lying in the bed with no acute distress.  EYES: Pupils equal, round, reactive to light and accommodation. No scleral icterus. Extraocular muscles intact.  HEENT: Head atraumatic, normocephalic. Oropharynx and nasopharynx clear.  NECK:  Supple, no jugular venous distention. No thyroid enlargement, no tenderness.  LUNGS: Normal breath sounds bilaterally, no wheezing, rales,rhonchi or crepitation. No use of accessory muscles of respiration.  CARDIOVASCULAR: S1, S2 normal. No murmurs, rubs, or gallops.  ABDOMEN: Soft, nontender, nondistended. Bowel sounds present. No organomegaly or mass.  EXTREMITIES: No pedal edema, cyanosis, or clubbing.  NEUROLOGIC: Cranial nerves II through XII are intact. Muscle strength 5/5 in all extremities. Sensation intact. Gait not checked.  PSYCHIATRIC: The patient is alert and oriented x 3.  SKIN: No obvious rash, lesion, or ulcer.   Data Review     CBC w Diff: Lab Results  Component Value Date   WBC 10.0 01/13/2015   WBC 8.2 08/11/2013   HGB 9.1* 01/13/2015   HGB 9.4* 08/11/2013   HCT 28.0* 01/13/2015   HCT 27.9* 08/11/2013   PLT 237 01/13/2015   PLT 451* 08/11/2013   LYMPHOPCT 20.4 08/11/2013   MONOPCT 12.2 08/11/2013   EOSPCT 4.3 08/11/2013   BASOPCT 0.9 08/11/2013   CMP: Lab Results  Component Value Date  NA 132* 01/13/2015   NA 130* 08/11/2013   K 4.7 01/13/2015   K 3.3* 08/11/2013   CL 95* 01/13/2015   CL 93* 08/11/2013   CO2 24 01/13/2015   CO2 29 08/11/2013   BUN 89* 01/13/2015   BUN 50* 08/11/2013   CREATININE 9.15* 01/13/2015   CREATININE 5.63* 08/11/2013   PROT 6.5 01/09/2015   PROT 6.6 08/11/2013   ALBUMIN 1.9* 01/13/2015   ALBUMIN 2.6* 08/11/2013   BILITOT 0.2* 01/09/2015   BILITOT 0.4 08/11/2013   ALKPHOS 71 01/09/2015   ALKPHOS 75 08/11/2013   AST 30 01/09/2015    AST 26 08/11/2013   ALT 17 01/09/2015   ALT 10* 08/11/2013  .  Micro Results No results found for this or any previous visit (from the past 240 hour(s)).      Code Status Orders        Start     Ordered   01/09/15 2043  Full code   Continuous     01/09/15 2042    Advance Directive Documentation        Most Recent Value   Type of Advance Directive  Living will   Pre-existing out of facility DNR order (yellow form or pink MOST form)     "MOST" Form in Place?            Follow-up Information    Follow up with [facility] .   Specialty:  [place] information:   [place] [phone]      Follow up with Park Breed, MD. Schedule an appointment as soon as possible for a visit in 2 weeks.   Specialty:  Specialist   Why:  For wound re-check   Contact information:   [place] [address] [phone]       Follow up with HAWKINS,EDWARD L, MD In 7 days.   Specialty:  Pulmonary Disease   Contact information:   [place] [address] [phone]       Discharge Medications     Medication List    TAKE these medications        acetaminophen 325 MG tablet  Commonly known as:  TYLENOL  Take 2 tablets (650 mg total) by mouth every 6 (six) hours as needed for mild pain (or Fever >/= 101).     ADVAIR DISKUS 250-50 MCG/DOSE Aepb  Generic drug:  Fluticasone-Salmeterol  Inhale 1 puff into the lungs every 12 (twelve) hours.     albuterol 108 (90 BASE) MCG/ACT inhaler  Commonly known as:  PROVENTIL HFA;VENTOLIN HFA  Inhale 2 puffs into the lungs every 6 (six) hours as needed for wheezing or shortness of breath.     aspirin 81 MG tablet  Take 81 mg by mouth daily.     feeding supplement (ENSURE ENLIVE) Liqd  Take 237 mLs by mouth 2 (two) times daily between meals.     fexofenadine 60 MG tablet  Commonly known  as:  ALLEGRA  Take 60 mg by mouth 2 (two) times daily.     furosemide 40 MG tablet  Commonly known as:  LASIX  Take 40 mg by mouth daily.     gentamicin ointment 0.1 %  Commonly known as:  GARAMYCIN  Apply 1 application topically at bedtime.     heparin 5000 UNIT/ML injection  Inject 1 mL (5,000 Units total) into the skin every 12 (twelve)  hours.     LORazepam 1 MG tablet  Commonly known as:  ATIVAN  Take 1 tablet (1 mg total) by mouth at bedtime as needed for anxiety.     mirtazapine 15 MG tablet  Commonly known as:  REMERON  Take 15 mg by mouth at bedtime.     nicotine 14 mg/24hr patch  Commonly known as:  NICODERM CQ - dosed in mg/24 hours  Place 1 patch (14 mg total) onto the skin daily.     omeprazole 20 MG capsule  Commonly known as:  PRILOSEC  Take 20 mg by mouth daily.     oxyCODONE 5 MG immediate release tablet  Commonly known as:  Oxy IR/ROXICODONE  Take 1-2 tablets (5-10 mg total) by mouth every 3 (three) hours as needed for severe pain or breakthrough pain.     polyethylene glycol packet  Commonly known as:  MIRALAX / GLYCOLAX  Take 17 g by mouth daily.     rosuvastatin 10 MG tablet  Commonly known as:  CRESTOR  Take 10 mg by mouth every evening.     senna-docusate 8.6-50 MG tablet  Commonly known as:  Senokot-S  Take 1 tablet by mouth 2 (two) times daily.     tiotropium 18 MCG inhalation capsule  Commonly known as:  SPIRIVA  Place 18 mcg into inhaler and inhale at bedtime.     valsartan 160 MG tablet  Commonly known as:  DIOVAN  Take 160 mg by mouth daily.     Vitamin D3 2000 UNITS Tabs  Take 1 tablet by mouth daily.           Total Time in preparing paper work, data evaluation and todays exam - 35 minutes  Dustin Flock M.D on 01/14/2015 at 10:45 AM  [facility]   Office  [phone]

## 2015-01-14 NOTE — Progress Notes (Signed)
HD tx start 

## 2015-01-14 NOTE — Care Management Note (Signed)
Patient is ready to discharge from a dialysis placement perspective.  Out patient dialysis placement for HD will be [facility] on TTS at 11:20 and needs to arrive for 1st treatment Saturday 01/16/15 at 11:00 to sign papers.   Iran Sizer  Dialysis Liaison  [phone]

## 2015-01-14 NOTE — Progress Notes (Signed)
[facility]  ROUNDING NOTE   Subjective:   Hemodialysis treatment. Tolerating treatment. UF goal of 1 litre Sitting in chair.   Objective:  Vital signs in last 24 hours:  Temp:  [98.1 F (36.7 C)-100.4 F (38 C)] 98.7 F (37.1 C) (11/10 1255) Pulse Rate:  [82-103] 95 (11/10 1405) Resp:  [14-24] 21 (11/10 1405) BP: (72-160)/(48-74) 137/59 mmHg (11/10 1405) SpO2:  [92 %-100 %] 98 % (11/10 1405) Weight:  [53.9 kg (118 lb 13.3 oz)-64.501 kg (142 lb 3.2 oz)] 64.5 kg (142 lb 3.2 oz) (11/10 1255)  Weight change: -7.889 kg (-17 lb 6.3 oz) Filed Weights   01/13/15 1705 01/14/15 0554 01/14/15 1255  Weight: 53.9 kg (118 lb 13.3 oz) 64.501 kg (142 lb 3.2 oz) 64.5 kg (142 lb 3.2 oz)    Intake/Output: I/O last 3 completed shifts: In: -  Out: 1000 [Urine:1000]   Intake/Output this shift:  Total I/O In: 240 [P.O.:240] Out: -   Physical Exam: General: NAD  Head: Normocephalic, atraumatic. Moist oral mucosal membranes  Eyes: Anicteric, PERRL  Neck: Supple, trachea midline  Lungs:  Clear to auscultation  Heart: Regular rate and rhythm  Abdomen:  Soft, nontender, +PD catheter  Extremities: no peripheral edema  Neurologic: More alert  Skin: No lesions  Access PD catheter, RIJ permcath 11/9 Dr. Lucky Cowboy    Basic Metabolic Panel:  Recent Labs Lab 01/11/15 0332 01/12/15 0519 01/13/15 0532 01/13/15 1443 01/14/15 1341  NA 135 128* 131* 132* 136  K 4.2 4.3 4.6 4.7 4.1  CL 101 93* 94* 95* 97*  CO2 27 26 25 24 29   GLUCOSE 117* 90 96 88 151*  BUN 50* 64* 83* 89* 55*  CREATININE 6.85* 8.05* 8.86* 9.15* 5.84*  CALCIUM 8.3* 7.5* 7.6* 7.5* 7.6*  PHOS  --   --   --  8.1* 3.8    Liver Function Tests:  Recent Labs Lab 01/09/15 1731 01/13/15 1443 01/14/15 1341  AST 30  --   --   ALT 17  --   --   ALKPHOS 71  --   --   BILITOT 0.2*  --   --   PROT 6.5  --   --   ALBUMIN 2.9* 1.9* 1.7*   No results for input(s): LIPASE, AMYLASE in the last 168 hours. No results  for input(s): AMMONIA in the last 168 hours.  CBC:  Recent Labs Lab 01/09/15 1731 01/11/15 0332 01/12/15 0519 01/13/15 0532 01/14/15 1341  WBC 8.5 8.5 10.9 10.0 9.2  HGB 13.0 11.8* 9.9* 9.1* 9.6*  HCT 38.7 36.1 30.4* 28.0* 28.7*  MCV 95.0 94.3 93.8 93.5 94.7  PLT 228 252 216 237 266    Cardiac Enzymes: No results for input(s): CKTOTAL, CKMB, CKMBINDEX, TROPONINI in the last 168 hours.  BNP: Invalid input(s): POCBNP  CBG:  Recent Labs Lab 01/12/15 1134  GLUCAP 78    Microbiology: Results for orders placed or performed during the hospital encounter of 07/01/12  Surgical pcr screen     Status: None   Collection Time: 07/01/12 11:37 AM  Result Value Ref Range Status   MRSA, PCR NEGATIVE NEGATIVE Final   Staphylococcus aureus NEGATIVE NEGATIVE Final    Comment:        The Xpert SA Assay (FDA approved for NASAL specimens in patients over 63 years of age), is one component of a comprehensive surveillance program.  Test performance has been validated by [employer] for patients greater than or equal to 4 year old.  It is not intended to diagnose infection nor to guide or monitor treatment.    Coagulation Studies: No results for input(s): LABPROT, INR in the last 72 hours.  Urinalysis: No results for input(s): COLORURINE, LABSPEC, PHURINE, GLUCOSEU, HGBUR, BILIRUBINUR, KETONESUR, PROTEINUR, UROBILINOGEN, NITRITE, LEUKOCYTESUR in the last 72 hours.  Invalid input(s): APPERANCEUR    Imaging: No results found.   Medications:   . sodium chloride 10 mL/hr at 01/13/15 1212   . aspirin EC  81 mg Oral Daily  . bisacodyl  10 mg Rectal Once  . cholecalciferol  1,000 Units Oral Daily  . dialysis solution 2.5% low-MG/low-CA   Intraperitoneal Q24H  . epoetin (EPOGEN/PROCRIT) injection  10,000 Units Intravenous Once  . feeding supplement (ENSURE ENLIVE)  237 mL Oral BID BM  . gentamicin ointment   Topical Daily  . heparin subcutaneous  5,000 Units Subcutaneous  Q12H  . loratadine  10 mg Oral Daily  . mirtazapine  15 mg Oral QHS  . mometasone-formoterol  2 puff Inhalation BID  . nicotine  14 mg Transdermal Daily  . pantoprazole  40 mg Oral Daily  . polyethylene glycol  17 g Oral Daily  . rosuvastatin  10 mg Oral QPM  . senna-docusate  1 tablet Oral BID  . sodium chloride  3 mL Intravenous Q12H  . tiotropium  18 mcg Inhalation QHS   sodium chloride, acetaminophen **OR** acetaminophen, albuterol, heparin, lactulose, LORazepam, morphine injection, morphine injection, ondansetron (ZOFRAN) IV, oxyCODONE, sodium chloride, traMADol  Assessment/ Plan:  Lydia Clark is a 74 y.o. female withe renal disease on peritoneal dialysis, hypertension, COPD, GERD, hyperlipidemia, generalized anxiety disorder, history depression, anemia of chronic kidney disease, secondary hyperparathyroidism and osteopenia.  [place] PD   1. End Stage Renal Disease: tolerating PD well. However now going to SNF, [facility]. Cannot do PD there and transitioned to HD through permcath Seen and examined on hemodialysis. Tolerating treatment well. Sitting in chair during treatment.  Outpatient schedule for TTS at [facility]   2. Anemia of chronic kidney disease: hemoglobin at goal. 9.6 - epo with treatment: 10000 units.   3. Secondary Hyperparathyroidism: calcium and phosphorus at goal. PTH at goal as outpatient.  - Calcitriol - No binders.   4. Hypertension: now at goal.  - home regimen of valsartan and furosemide. Hold all agents due to hypotension and dizziness.   5. Nutrition: albumin 1.7. Not eating at home. Suspect depression related.  -  Mirtazapine.  - appreciate dietician input.    LOS: [place], [place] 11/10/20162:47 PM

## 2015-01-14 NOTE — Clinical Social Work Placement (Signed)
   CLINICAL SOCIAL WORK PLACEMENT  NOTE  Date:  01/14/2015  Patient Details  Name: Lydia Clark MRN: [id number] Date of Birth: February 03, 1941  Clinical Social Work is seeking post-discharge placement for this patient at the [place] level of care (*CSW will initial, date and re-position this form in  chart as items are completed):  Yes   Patient/family provided with [facility] Work Department's list of facilities offering this level of care within the geographic area requested by the patient (or if unable, by the patient's family).  Yes   Patient/family informed of their freedom to choose among providers that offer the needed level of care, that participate in Medicare, Medicaid or managed care program needed by the patient, have an available bed and are willing to accept the patient.  Yes   Patient/family informed of [facility]'s ownership interest in [facility] and [facility], as well as of the fact that they are under no obligation to receive care at these facilities.  PASRR submitted to EDS on 01/11/15     PASRR number received on 01/11/15     Existing PASRR number confirmed on       FL2 transmitted to all facilities in geographic area requested by pt/family on 01/11/15     FL2 transmitted to all facilities within larger geographic area on       Patient informed that his/her managed care company has contracts with or will negotiate with certain facilities, including the following:        Yes   Patient/family informed of bed offers received.  Patient chooses bed at  [facility])     Physician recommends and patient chooses bed at      Patient to be transferred to  [facility]) on 01/14/15.  Patient to be transferred to facility by  (EMS)     Patient family notified on 01/14/15 of transfer.  Name of family member notified:   (Son  Gerald Stabs)     PHYSICIAN Please sign FL2     Additional Comment:     _______________________________________________ Maurine Cane, LCSW 01/14/2015, 12:05 PM

## 2015-01-14 NOTE — Progress Notes (Signed)
Pre-Hd tx. Patient dialyzing in chair.

## 2015-01-14 NOTE — Progress Notes (Signed)
Physical Therapy Treatment Patient Details Name: Lydia Clark MRN: [id number] DOB: [birth year] Today's Date: 01/14/2015    History of Present Illness Lydia Clark is a 74 y.o. female with a known history of hypertension, end-stage renal disease on peritoneal dialysis, chronic COPD and continues to smoke is presenting to the ED after she sustained a fall. X-ray has revealed left femoral neck fracture and CT head is negative for any acute abnormalities. Patient is s/p left hip hemiarthroplasty. She is on posterior hip precautions at this time. Pt had perm cath placed on 01/13/15 for hemodialysis while at [facility]    PT Comments    Pt with more energy and strength this morning and able to perform limited ambulation to the recliner, which she did not have the strength to do yesterday. She is still unstable in standing and fatigues quickly once on her feet, so any ambulation needs to be performed to a short target or with a chair follow. Due to these deficits she will continue to benefit from skilled PT in order to return to optimal PLOF. All mobility performed on 3L O2 [place] with SaO2 % stable throughout. Plan is to progress ambulation distance.  Follow Up Recommendations  SNF     Equipment Recommendations  Rolling walker with 5" wheels    Recommendations for Other Services       Precautions / Restrictions Precautions Precautions: Fall Restrictions Weight Bearing Restrictions: Yes LLE Weight Bearing: Weight bearing as tolerated Other Position/Activity Restrictions: posterior hip precautions    Mobility  Bed Mobility Overal bed mobility: Needs Assistance Bed Mobility: Supine to Sit     Supine to sit: Min assist     General bed mobility comments: Pt showing this morning that she can perform scooting to EOB independently. Still requires assist for raising trunk into sitting  Transfers Overall transfer level: Needs assistance Equipment used: Rolling walker (2 wheeled) Transfers: Sit  to/from Stand Sit to Stand: Min assist         General transfer comment: Pt performing transfer with less assist this morning. Able to get fully into standing with assist of trunk. Still has wobbly legs in standing but marked improvement since yesterday  Ambulation/Gait Ambulation/Gait assistance: Min assist Ambulation Distance (Feet): 4 Feet Assistive device: Rolling walker (2 wheeled) Gait Pattern/deviations: Step-to pattern;Shuffle;Decreased step length - right;Decreased step length - left;Decreased stride length Gait velocity: greatly decreased Gait velocity interpretation: Below normal speed for age/gender General Gait Details: Pt with ample strength to perform limited ambulation this morning. She needs heavy cues for stepping and assist for weight shifting in order to facilitate stepping. Pt fatigues after 4 ft of ambulation and needs to sit immediately    Stairs            Wheelchair Mobility    Modified Rankin (Stroke Patients Only)       Balance Overall balance assessment: History of Falls Sitting-balance support: Bilateral upper extremity supported Sitting balance-Leahy Scale: Fair Sitting balance - Comments: Improved sitting balance but still tending to lose balance posterior after about 30 seconds                            Cognition Arousal/Alertness: Awake/alert Behavior During Therapy: WFL for tasks assessed/performed Overall Cognitive Status: Within Functional Limits for tasks assessed                      Exercises Other Exercises Other Exercises: Pt performed bilateral therex  x 12 reps at min assist for facilitation of movement. Exercises performed: ankle pumps, quad sets, SAQ, SLR, and hip ABD.     General Comments        Pertinent Vitals/Pain Pain Assessment: No/denies pain    Home Living                      Prior Function            PT Goals (current goals can now be found in the care plan section) Acute  Rehab PT Goals Patient Stated Goal: to get to chair PT Goal Formulation: With patient/family Time For Goal Achievement: 01/24/15 Potential to Achieve Goals: Fair Progress towards PT goals: Progressing toward goals    Frequency  BID    PT Plan Current plan remains appropriate    Co-evaluation             End of Session Equipment Utilized During Treatment: Gait belt Activity Tolerance: Patient tolerated treatment well Patient left: in chair;with call bell/phone within reach;with chair alarm set;with family/visitor present;with SCD's reapplied     Time: [id number] PT Time Calculation (min) (ACUTE ONLY): 25 min  Charges:                       G CodesJanyth Contes 01-27-15, 10:09 AM  Janyth Contes, SPT. [phone]

## 2015-01-14 NOTE — Progress Notes (Signed)
Post Tx Assessment. °

## 2015-01-14 NOTE — Progress Notes (Signed)
PT Cancellation Note  Patient Details Name: Lydia Clark MRN: [id number] DOB: Jun 07, 1940   Cancelled Treatment:    Reason Eval/Treat Not Completed: Other (comment) (See PT note for further details) PT attempted this afternoon but pt out of room for dialysis treatment. Will attempt at later time/date when available.    Janyth Contes 01/14/2015, 1:12 PM Janyth Contes, SPT. [phone]

## 2015-01-14 NOTE — Progress Notes (Signed)
Subjective:  4 days post left hip hemiarthroplasty.  Patient reports pain as mild. Had dialysis catheter inserted yesterday. Came by to see her but she was off the floor. Still weak but appetite fairly good.  Lives alone at home so will need SNF for a time.   Objective:   VITALS:   Filed Vitals:   01/14/15 0742  BP: 133/60  Pulse: 88  Temp:   Resp:     Neurologically intact  Dressing dry.   Hip stable. CSM good distally.  No swelling. Alert and oriented. LABS  Recent Labs  01/12/15 0519 01/13/15 0532  HGB 9.9* 9.1*  HCT 30.4* 28.0*  WBC 10.9 10.0  PLT 216 237     Recent Labs  01/12/15 0519 01/13/15 0532 01/13/15 1443  NA 128* 131* 132*  K 4.3 4.6 4.7  BUN 64* 83* 89*  CREATININE 8.05* 8.86* 9.15*  GLUCOSE 90 96 88    No results for input(s): LABPT, INR in the last 72 hours.   Assessment/Plan:  4 days post left hip hemiarthroplasty, cemented    Advance diet Up with therapy Discharge to SNF when stable.  RTC 2 weeks

## 2015-01-14 NOTE — Progress Notes (Signed)
Pre-hd tx 

## 2015-01-14 NOTE — Consult Note (Signed)
CC: generalized weakness   HPI: Lydia Clark is an 74 y.o. female a known history of hypertension, end-stage renal disease on peritoneal dialysis, chronic COPD and continues to smoke is presenting to the ED after she sustained a fall. X-ray has revealed left femoral neck fracture. Pt is s/p OR Pt also had catheter for dialysis placed.   Neurology consulted for generalized weakness.    Past Medical History  Diagnosis Date  . Allergy   . Cancer [facility]) 2008    left breast  . Hypertension 2006  . Personal history of tobacco use, presenting hazards to health 2012    30 yrs smoking  . Endocrine disorder 2008    thyroid  . Personal history of malignant neoplasm of breast 2008  . Breast screening, unspecified 2013  . Special screening for malignant neoplasms, colon 2013  . COPD (chronic obstructive pulmonary disease) ([place])   . PE (pulmonary embolism)     patient denies  . Swelling of throat     swelling at base of throat,  . Anxiety     anxiety  . Pneumonia     hx  . Heart murmur     child  . History of kidney stones   . GERD (gastroesophageal reflux disease)   . Arthritis   . Multinodular goiter     followed by Dr. Carloyn Manner @ [facility]  . Dialysis patient ([place]) 2014  . Chronic kidney disease 2014    stage IV chronic     Past Surgical History  Procedure Laterality Date  . Breast surgery Left 2008    lumpectomy  . Fooy Left     lft foot  . Foot surgery  2005  . 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 Tonsillectomy  2005  . Breast biopsy  2005  . Appendectomy  2005  . Replacement total knee Left 2012    Partial   . Colonoscopy  2007    done in [place]  . Insertion of dialysis catheter Right 07/02/2012    Procedure: INSERTION OF DIALYSIS CATHETER;  Surgeon: Elam Dutch, MD;  Location: [place];  Service: Vascular;  Laterality: Right;  Right Internal Jugular  . Insertion of dialysis catheter Right July 02 2012    right chest/ temporary  cath  . Hip arthroplasty Left 01/10/2015    Procedure: ARTHROPLASTY BIPOLAR HIP (HEMIARTHROPLASTY);  Surgeon: Claud Kelp, MD;  Location: [facility];  Service: Orthopedics;  Laterality: Left;    Family History  Problem Relation Age of Onset  . Cancer Other     breast cancer  . Heart attack Father   . Pneumonia Father     Social History:  reports that she has been smoking Cigarettes.  She has a 9 pack-year smoking history. She has never used smokeless tobacco. She reports that she does not drink alcohol or use illicit drugs.  Allergies  Allergen Reactions  . Bee Venom Swelling and Other (See Comments)    Breathing problems  . Biaxin [Clarithromycin] Other (See Comments)    Throat swells  . Mycostatin [Nystatin] Other (See Comments)    Blisters from the ointment  . Polysorbate     Other reaction(s): Unknown  . Sulfa Antibiotics Other (See Comments)    "welps"    Medications: I have reviewed the patient's current medications.  ROS: Unable t  Physical Examination: Blood pressure 133/60, pulse 88, temperature 98.5 F (36.9 C), temperature source Oral, resp.  rate 18, height 5\' 7"  (1.702 m), weight 142 lb 3.2 oz (64.501 kg), SpO2 93 %.    Neurological Examination Mental Status: Alert to name Cranial Nerves: II: Discs flat bilaterally; Visual fields grossly normal, pupils equal, round, reactive to light and accommodation III,IV, VI: ptosis not present, extra-ocular motions intact bilaterally V,VII: smile symmetric, facial light touch sensation normal bilaterally VIII: hearing normal bilaterally IX,X: gag reflex present XI: bilateral shoulder shrug XII: midline tongue extension Motor: Right : Upper extremity   4/5    Left:     Upper extremity   4/5  Lower extremity   4/5     Lower extremity   4/5 Tone and bulk:normal tone throughout; no atrophy noted Sensory: Pinprick and light touch intact throughout, bilaterally Deep Tendon Reflexes: 1+ and symmetric  throughout Plantars: Right: downgoing   Left: downgoing Cerebellar: normal finger-to-nose, normal rapid alternating movements and normal heel-to-shin test Gait: not tested       Laboratory Studies:   Basic Metabolic Panel:  Recent Labs Lab 01/09/15 1731 01/11/15 0332 01/12/15 0519 01/13/15 0532 01/13/15 1443  NA 135 135 128* 131* 132*  K 3.9 4.2 4.3 4.6 4.7  CL 96* 101 93* 94* 95*  CO2 29 27 26 25 24   GLUCOSE 92 117* 90 96 88  BUN 49* 50* 64* 83* 89*  CREATININE 6.81* 6.85* 8.05* 8.86* 9.15*  CALCIUM 8.9 8.3* 7.5* 7.6* 7.5*  PHOS  --   --   --   --  8.1*    Liver Function Tests:  Recent Labs Lab 01/09/15 1731 01/13/15 1443  AST 30  --   ALT 17  --   ALKPHOS 71  --   BILITOT 0.2*  --   PROT 6.5  --   ALBUMIN 2.9* 1.9*   No results for input(s): LIPASE, AMYLASE in the last 168 hours. No results for input(s): AMMONIA in the last 168 hours.  CBC:  Recent Labs Lab 01/09/15 1731 01/11/15 0332 01/12/15 0519 01/13/15 0532  WBC 8.5 8.5 10.9 10.0  HGB 13.0 11.8* 9.9* 9.1*  HCT 38.7 36.1 30.4* 28.0*  MCV 95.0 94.3 93.8 93.5  PLT 228 252 216 237    Cardiac Enzymes: No results for input(s): CKTOTAL, CKMB, CKMBINDEX, TROPONINI in the last 168 hours.  BNP: Invalid input(s): POCBNP  CBG:  Recent Labs Lab 01/12/15 1134  GLUCAP 78    Microbiology: Results for orders placed or performed during the hospital encounter of 07/01/12  Surgical pcr screen     Status: None   Collection Time: 07/01/12 11:37 AM  Result Value Ref Range Status   MRSA, PCR NEGATIVE NEGATIVE Final   Staphylococcus aureus NEGATIVE NEGATIVE Final    Comment:        The Xpert SA Assay (FDA approved for NASAL specimens in patients over 48 years of age), is one component of a comprehensive surveillance program.  Test performance has been validated by [employer] for patients greater than or equal to 12 year old. It is not intended to diagnose infection nor to guide or monitor  treatment.    Coagulation Studies: No results for input(s): LABPROT, INR in the last 72 hours.  Urinalysis: No results for input(s): COLORURINE, LABSPEC, PHURINE, GLUCOSEU, HGBUR, BILIRUBINUR, KETONESUR, PROTEINUR, UROBILINOGEN, NITRITE, LEUKOCYTESUR in the last 168 hours.  Invalid input(s): APPERANCEUR  Lipid Panel:  No results found for: CHOL, TRIG, HDL, CHOLHDL, VLDL, LDLCALC  HgbA1C: No results found for: HGBA1C  Urine Drug Screen:  No results found for:  LABOPIA, COCAINSCRNUR, LABBENZ, AMPHETMU, THCU, LABBARB  Alcohol Level: No results for input(s): ETH in the last 168 hours.   Imaging: No results found.   Assessment/Plan:  74 y.o. female a known history of hypertension, end-stage renal disease on peritoneal dialysis, chronic COPD and continues to smoke is presenting to the ED after she sustained a fall. X-ray has revealed left femoral neck fracture. Pt is s/p OR Pt also had catheter for dialysis placed.   Neurology consulted for generalized weakness.    Case d/w Lydia Clark pt's son over phone.  It appears that pt is close to baseline which he attributes it to malnutrition in terms of generalized weakness Pt does appear to have episodes of delirium which has improved since yesterday.  Today pt is sitting in a chair.   If becomes aggressive would consider seroquel Pt does live alone, would consider visiting nurse service if possible.  Otherwise no further testing from neuro stand point as she is much improved.  Leotis Pain   01/14/2015, 10:15 AM

## 2015-01-14 NOTE — Progress Notes (Addendum)
Clinical Social Worker informed Pensions consultant,  MD that patient is medically ready to discharge to SNF, Patient and Son, Gerald Stabs are in a agreement with plan.  Call to [facility] to confirm that patient's bed is ready.  Also informed facility that patient's Dialysis days [facility] Sat at 11:30.[place] however patient will need to arrive this Sat at 11:00.  Information shared with D[facility] at [facility].  Provided patient's room number 402 and number to call for report [phone] . All discharge information faxed to Facility. Rx's added to discharge packet.     RN will call report and patient will discharge to [facility] via EMS.  Casimer Lanius. Latanya Presser, MSW Clinical Social Work Department [phone] 12:16 PM

## 2015-01-14 NOTE — Progress Notes (Signed)
Post HD Assessment 

## 2015-01-14 NOTE — Discharge Instructions (Signed)
°  DIET:  Renal diet  DISCHARGE CONDITION:  Stable  ACTIVITY:  Activity as tolerated with assitance  OXYGEN:  Home Oxygen: Yes.     Oxygen Delivery: 3 liters/min via Patient connected to nasal cannula oxygen  DISCHARGE LOCATION:  nursing home    ADDITIONAL DISCHARGE INSTRUCTION:wean oxygen, Incentive spriometery every hour when awake Voiding trial in 1day, then in and out cath as needed   If you experience worsening of your admission symptoms, develop shortness of breath, life threatening emergency, suicidal or homicidal thoughts you must seek medical attention immediately by calling 911 or calling your MD immediately  if symptoms less severe.  You Must read complete instructions/literature along with all the possible adverse reactions/side effects for all the Medicines you take and that have been prescribed to you. Take any new Medicines after you have completely understood and accpet all the possible adverse reactions/side effects.   Please note  You were cared for by a hospitalist during your hospital stay. If you have any questions about your discharge medications or the care you received while you were in the hospital after you are discharged, you can call the unit and asked to speak with the hospitalist on call if the hospitalist that took care of you is not available. Once you are discharged, your primary care physician will handle any further medical issues. Please note that NO REFILLS for any discharge medications will be authorized once you are discharged, as it is imperative that you return to your primary care physician (or establish a relationship with a primary care physician if you do not have one) for your aftercare needs so that they can reassess your need for medications and monitor your lab values.

## 2015-01-15 DIAGNOSIS — I10 Essential (primary) hypertension: Secondary | ICD-10-CM | POA: Diagnosis not present

## 2015-01-15 DIAGNOSIS — E785 Hyperlipidemia, unspecified: Secondary | ICD-10-CM | POA: Diagnosis not present

## 2015-01-15 DIAGNOSIS — E43 Unspecified severe protein-calorie malnutrition: Secondary | ICD-10-CM | POA: Diagnosis not present

## 2015-01-15 DIAGNOSIS — J449 Chronic obstructive pulmonary disease, unspecified: Secondary | ICD-10-CM | POA: Diagnosis not present

## 2015-01-15 DIAGNOSIS — Z96642 Presence of left artificial hip joint: Secondary | ICD-10-CM | POA: Diagnosis not present

## 2015-01-15 NOTE — Progress Notes (Signed)
Discharge Note:  Pts VSS, report called to [facility].  Pt transported via EMS.

## 2015-01-16 DIAGNOSIS — Z992 Dependence on renal dialysis: Secondary | ICD-10-CM | POA: Diagnosis not present

## 2015-01-16 DIAGNOSIS — N186 End stage renal disease: Secondary | ICD-10-CM | POA: Diagnosis not present

## 2015-01-19 DIAGNOSIS — N186 End stage renal disease: Secondary | ICD-10-CM | POA: Diagnosis not present

## 2015-01-19 DIAGNOSIS — D509 Iron deficiency anemia, unspecified: Secondary | ICD-10-CM | POA: Diagnosis not present

## 2015-01-19 DIAGNOSIS — Z992 Dependence on renal dialysis: Secondary | ICD-10-CM | POA: Diagnosis not present

## 2015-01-21 DIAGNOSIS — D509 Iron deficiency anemia, unspecified: Secondary | ICD-10-CM | POA: Diagnosis not present

## 2015-01-21 DIAGNOSIS — Z992 Dependence on renal dialysis: Secondary | ICD-10-CM | POA: Diagnosis not present

## 2015-01-21 DIAGNOSIS — N186 End stage renal disease: Secondary | ICD-10-CM | POA: Diagnosis not present

## 2015-01-23 DIAGNOSIS — D509 Iron deficiency anemia, unspecified: Secondary | ICD-10-CM | POA: Diagnosis not present

## 2015-01-23 DIAGNOSIS — N186 End stage renal disease: Secondary | ICD-10-CM | POA: Diagnosis not present

## 2015-01-23 DIAGNOSIS — Z992 Dependence on renal dialysis: Secondary | ICD-10-CM | POA: Diagnosis not present

## 2015-01-26 DIAGNOSIS — R195 Other fecal abnormalities: Secondary | ICD-10-CM | POA: Diagnosis not present

## 2015-01-26 DIAGNOSIS — I10 Essential (primary) hypertension: Secondary | ICD-10-CM | POA: Diagnosis not present

## 2015-01-26 DIAGNOSIS — J449 Chronic obstructive pulmonary disease, unspecified: Secondary | ICD-10-CM | POA: Diagnosis not present

## 2015-01-26 DIAGNOSIS — N186 End stage renal disease: Secondary | ICD-10-CM | POA: Diagnosis not present

## 2015-01-26 DIAGNOSIS — Z471 Aftercare following joint replacement surgery: Secondary | ICD-10-CM | POA: Diagnosis not present

## 2015-01-27 DIAGNOSIS — Z992 Dependence on renal dialysis: Secondary | ICD-10-CM | POA: Diagnosis not present

## 2015-01-27 DIAGNOSIS — N186 End stage renal disease: Secondary | ICD-10-CM | POA: Diagnosis not present

## 2015-01-27 DIAGNOSIS — D509 Iron deficiency anemia, unspecified: Secondary | ICD-10-CM | POA: Diagnosis not present

## 2015-01-30 DIAGNOSIS — D509 Iron deficiency anemia, unspecified: Secondary | ICD-10-CM | POA: Diagnosis not present

## 2015-01-30 DIAGNOSIS — N186 End stage renal disease: Secondary | ICD-10-CM | POA: Diagnosis not present

## 2015-01-30 DIAGNOSIS — Z992 Dependence on renal dialysis: Secondary | ICD-10-CM | POA: Diagnosis not present

## 2015-02-02 DIAGNOSIS — Z992 Dependence on renal dialysis: Secondary | ICD-10-CM | POA: Diagnosis not present

## 2015-02-02 DIAGNOSIS — N186 End stage renal disease: Secondary | ICD-10-CM | POA: Diagnosis not present

## 2015-02-02 DIAGNOSIS — D509 Iron deficiency anemia, unspecified: Secondary | ICD-10-CM | POA: Diagnosis not present

## 2015-02-03 DIAGNOSIS — S72032A Displaced midcervical fracture of left femur, initial encounter for closed fracture: Secondary | ICD-10-CM | POA: Diagnosis not present

## 2015-02-04 DIAGNOSIS — D631 Anemia in chronic kidney disease: Secondary | ICD-10-CM | POA: Diagnosis not present

## 2015-02-04 DIAGNOSIS — D509 Iron deficiency anemia, unspecified: Secondary | ICD-10-CM | POA: Diagnosis not present

## 2015-02-04 DIAGNOSIS — N186 End stage renal disease: Secondary | ICD-10-CM | POA: Diagnosis not present

## 2015-02-04 DIAGNOSIS — N2581 Secondary hyperparathyroidism of renal origin: Secondary | ICD-10-CM | POA: Diagnosis not present

## 2015-02-04 DIAGNOSIS — Z992 Dependence on renal dialysis: Secondary | ICD-10-CM | POA: Diagnosis not present

## 2015-02-06 DIAGNOSIS — N186 End stage renal disease: Secondary | ICD-10-CM | POA: Diagnosis not present

## 2015-02-06 DIAGNOSIS — D631 Anemia in chronic kidney disease: Secondary | ICD-10-CM | POA: Diagnosis not present

## 2015-02-06 DIAGNOSIS — Z992 Dependence on renal dialysis: Secondary | ICD-10-CM | POA: Diagnosis not present

## 2015-02-06 DIAGNOSIS — N2581 Secondary hyperparathyroidism of renal origin: Secondary | ICD-10-CM | POA: Diagnosis not present

## 2015-02-06 DIAGNOSIS — D509 Iron deficiency anemia, unspecified: Secondary | ICD-10-CM | POA: Diagnosis not present

## 2015-02-09 DIAGNOSIS — N186 End stage renal disease: Secondary | ICD-10-CM | POA: Diagnosis not present

## 2015-02-09 DIAGNOSIS — Z992 Dependence on renal dialysis: Secondary | ICD-10-CM | POA: Diagnosis not present

## 2015-02-09 DIAGNOSIS — D509 Iron deficiency anemia, unspecified: Secondary | ICD-10-CM | POA: Diagnosis not present

## 2015-02-09 DIAGNOSIS — J449 Chronic obstructive pulmonary disease, unspecified: Secondary | ICD-10-CM | POA: Diagnosis not present

## 2015-02-09 DIAGNOSIS — R195 Other fecal abnormalities: Secondary | ICD-10-CM | POA: Diagnosis not present

## 2015-02-09 DIAGNOSIS — D631 Anemia in chronic kidney disease: Secondary | ICD-10-CM | POA: Diagnosis not present

## 2015-02-09 DIAGNOSIS — N2581 Secondary hyperparathyroidism of renal origin: Secondary | ICD-10-CM | POA: Diagnosis not present

## 2015-02-09 DIAGNOSIS — I10 Essential (primary) hypertension: Secondary | ICD-10-CM | POA: Diagnosis not present

## 2015-02-09 DIAGNOSIS — Z471 Aftercare following joint replacement surgery: Secondary | ICD-10-CM | POA: Diagnosis not present

## 2015-02-11 DIAGNOSIS — Z992 Dependence on renal dialysis: Secondary | ICD-10-CM | POA: Diagnosis not present

## 2015-02-11 DIAGNOSIS — D509 Iron deficiency anemia, unspecified: Secondary | ICD-10-CM | POA: Diagnosis not present

## 2015-02-11 DIAGNOSIS — N186 End stage renal disease: Secondary | ICD-10-CM | POA: Diagnosis not present

## 2015-02-11 DIAGNOSIS — D631 Anemia in chronic kidney disease: Secondary | ICD-10-CM | POA: Diagnosis not present

## 2015-02-11 DIAGNOSIS — N2581 Secondary hyperparathyroidism of renal origin: Secondary | ICD-10-CM | POA: Diagnosis not present

## 2015-02-13 DIAGNOSIS — N2581 Secondary hyperparathyroidism of renal origin: Secondary | ICD-10-CM | POA: Diagnosis not present

## 2015-02-13 DIAGNOSIS — N186 End stage renal disease: Secondary | ICD-10-CM | POA: Diagnosis not present

## 2015-02-13 DIAGNOSIS — Z992 Dependence on renal dialysis: Secondary | ICD-10-CM | POA: Diagnosis not present

## 2015-02-13 DIAGNOSIS — D509 Iron deficiency anemia, unspecified: Secondary | ICD-10-CM | POA: Diagnosis not present

## 2015-02-13 DIAGNOSIS — D631 Anemia in chronic kidney disease: Secondary | ICD-10-CM | POA: Diagnosis not present

## 2015-02-14 DIAGNOSIS — S72002D Fracture of unspecified part of neck of left femur, subsequent encounter for closed fracture with routine healing: Secondary | ICD-10-CM | POA: Diagnosis not present

## 2015-02-14 DIAGNOSIS — F419 Anxiety disorder, unspecified: Secondary | ICD-10-CM | POA: Diagnosis not present

## 2015-02-14 DIAGNOSIS — N186 End stage renal disease: Secondary | ICD-10-CM | POA: Diagnosis not present

## 2015-02-14 DIAGNOSIS — M1991 Primary osteoarthritis, unspecified site: Secondary | ICD-10-CM | POA: Diagnosis not present

## 2015-02-14 DIAGNOSIS — I120 Hypertensive chronic kidney disease with stage 5 chronic kidney disease or end stage renal disease: Secondary | ICD-10-CM | POA: Diagnosis not present

## 2015-02-14 DIAGNOSIS — J449 Chronic obstructive pulmonary disease, unspecified: Secondary | ICD-10-CM | POA: Diagnosis not present

## 2015-02-15 DIAGNOSIS — S72002D Fracture of unspecified part of neck of left femur, subsequent encounter for closed fracture with routine healing: Secondary | ICD-10-CM | POA: Diagnosis not present

## 2015-02-15 DIAGNOSIS — F419 Anxiety disorder, unspecified: Secondary | ICD-10-CM | POA: Diagnosis not present

## 2015-02-15 DIAGNOSIS — J449 Chronic obstructive pulmonary disease, unspecified: Secondary | ICD-10-CM | POA: Diagnosis not present

## 2015-02-15 DIAGNOSIS — M1991 Primary osteoarthritis, unspecified site: Secondary | ICD-10-CM | POA: Diagnosis not present

## 2015-02-15 DIAGNOSIS — N186 End stage renal disease: Secondary | ICD-10-CM | POA: Diagnosis not present

## 2015-02-15 DIAGNOSIS — I120 Hypertensive chronic kidney disease with stage 5 chronic kidney disease or end stage renal disease: Secondary | ICD-10-CM | POA: Diagnosis not present

## 2015-02-16 DIAGNOSIS — D509 Iron deficiency anemia, unspecified: Secondary | ICD-10-CM | POA: Diagnosis not present

## 2015-02-16 DIAGNOSIS — Z992 Dependence on renal dialysis: Secondary | ICD-10-CM | POA: Diagnosis not present

## 2015-02-16 DIAGNOSIS — N2581 Secondary hyperparathyroidism of renal origin: Secondary | ICD-10-CM | POA: Diagnosis not present

## 2015-02-16 DIAGNOSIS — D631 Anemia in chronic kidney disease: Secondary | ICD-10-CM | POA: Diagnosis not present

## 2015-02-16 DIAGNOSIS — N186 End stage renal disease: Secondary | ICD-10-CM | POA: Diagnosis not present

## 2015-02-17 DIAGNOSIS — F419 Anxiety disorder, unspecified: Secondary | ICD-10-CM | POA: Diagnosis not present

## 2015-02-17 DIAGNOSIS — N186 End stage renal disease: Secondary | ICD-10-CM | POA: Diagnosis not present

## 2015-02-17 DIAGNOSIS — J449 Chronic obstructive pulmonary disease, unspecified: Secondary | ICD-10-CM | POA: Diagnosis not present

## 2015-02-17 DIAGNOSIS — S72002D Fracture of unspecified part of neck of left femur, subsequent encounter for closed fracture with routine healing: Secondary | ICD-10-CM | POA: Diagnosis not present

## 2015-02-17 DIAGNOSIS — I120 Hypertensive chronic kidney disease with stage 5 chronic kidney disease or end stage renal disease: Secondary | ICD-10-CM | POA: Diagnosis not present

## 2015-02-17 DIAGNOSIS — M1991 Primary osteoarthritis, unspecified site: Secondary | ICD-10-CM | POA: Diagnosis not present

## 2015-02-18 DIAGNOSIS — D631 Anemia in chronic kidney disease: Secondary | ICD-10-CM | POA: Diagnosis not present

## 2015-02-18 DIAGNOSIS — Z992 Dependence on renal dialysis: Secondary | ICD-10-CM | POA: Diagnosis not present

## 2015-02-18 DIAGNOSIS — N2581 Secondary hyperparathyroidism of renal origin: Secondary | ICD-10-CM | POA: Diagnosis not present

## 2015-02-18 DIAGNOSIS — N186 End stage renal disease: Secondary | ICD-10-CM | POA: Diagnosis not present

## 2015-02-18 DIAGNOSIS — D509 Iron deficiency anemia, unspecified: Secondary | ICD-10-CM | POA: Diagnosis not present

## 2015-02-19 DIAGNOSIS — N186 End stage renal disease: Secondary | ICD-10-CM | POA: Diagnosis not present

## 2015-02-19 DIAGNOSIS — J449 Chronic obstructive pulmonary disease, unspecified: Secondary | ICD-10-CM | POA: Diagnosis not present

## 2015-02-19 DIAGNOSIS — S72002D Fracture of unspecified part of neck of left femur, subsequent encounter for closed fracture with routine healing: Secondary | ICD-10-CM | POA: Diagnosis not present

## 2015-02-19 DIAGNOSIS — M1991 Primary osteoarthritis, unspecified site: Secondary | ICD-10-CM | POA: Diagnosis not present

## 2015-02-19 DIAGNOSIS — F419 Anxiety disorder, unspecified: Secondary | ICD-10-CM | POA: Diagnosis not present

## 2015-02-19 DIAGNOSIS — I120 Hypertensive chronic kidney disease with stage 5 chronic kidney disease or end stage renal disease: Secondary | ICD-10-CM | POA: Diagnosis not present

## 2015-02-20 DIAGNOSIS — D631 Anemia in chronic kidney disease: Secondary | ICD-10-CM | POA: Diagnosis not present

## 2015-02-20 DIAGNOSIS — Z992 Dependence on renal dialysis: Secondary | ICD-10-CM | POA: Diagnosis not present

## 2015-02-20 DIAGNOSIS — N2581 Secondary hyperparathyroidism of renal origin: Secondary | ICD-10-CM | POA: Diagnosis not present

## 2015-02-20 DIAGNOSIS — N186 End stage renal disease: Secondary | ICD-10-CM | POA: Diagnosis not present

## 2015-02-20 DIAGNOSIS — D509 Iron deficiency anemia, unspecified: Secondary | ICD-10-CM | POA: Diagnosis not present

## 2015-02-22 DIAGNOSIS — I120 Hypertensive chronic kidney disease with stage 5 chronic kidney disease or end stage renal disease: Secondary | ICD-10-CM | POA: Diagnosis not present

## 2015-02-22 DIAGNOSIS — M1991 Primary osteoarthritis, unspecified site: Secondary | ICD-10-CM | POA: Diagnosis not present

## 2015-02-22 DIAGNOSIS — N186 End stage renal disease: Secondary | ICD-10-CM | POA: Diagnosis not present

## 2015-02-22 DIAGNOSIS — F419 Anxiety disorder, unspecified: Secondary | ICD-10-CM | POA: Diagnosis not present

## 2015-02-22 DIAGNOSIS — J449 Chronic obstructive pulmonary disease, unspecified: Secondary | ICD-10-CM | POA: Diagnosis not present

## 2015-02-22 DIAGNOSIS — S72002D Fracture of unspecified part of neck of left femur, subsequent encounter for closed fracture with routine healing: Secondary | ICD-10-CM | POA: Diagnosis not present

## 2015-02-23 ENCOUNTER — Emergency Department: Payer: Medicare Other

## 2015-02-23 ENCOUNTER — Inpatient Hospital Stay
Admission: EM | Admit: 2015-02-23 | Discharge: 2015-02-26 | DRG: 291 | Disposition: A | Discharge status: Home Health | Payer: Medicare Other | Attending: Internal Medicine | Admitting: Internal Medicine

## 2015-02-23 ENCOUNTER — Inpatient Hospital Stay (HOSPITAL_COMMUNITY)
Admit: 2015-02-23 | Discharge: 2015-02-23 | Disposition: A | Discharge status: Home / Self Care | Payer: Medicare Other | Attending: Internal Medicine | Admitting: Internal Medicine

## 2015-02-23 ENCOUNTER — Encounter: Payer: Self-pay | Admitting: Emergency Medicine

## 2015-02-23 DIAGNOSIS — E872 Acidosis: Secondary | ICD-10-CM | POA: Diagnosis present

## 2015-02-23 DIAGNOSIS — F17210 Nicotine dependence, cigarettes, uncomplicated: Secondary | ICD-10-CM | POA: Diagnosis present

## 2015-02-23 DIAGNOSIS — R609 Edema, unspecified: Secondary | ICD-10-CM | POA: Diagnosis not present

## 2015-02-23 DIAGNOSIS — R0600 Dyspnea, unspecified: Secondary | ICD-10-CM | POA: Diagnosis not present

## 2015-02-23 DIAGNOSIS — R7989 Other specified abnormal findings of blood chemistry: Secondary | ICD-10-CM | POA: Diagnosis not present

## 2015-02-23 DIAGNOSIS — I248 Other forms of acute ischemic heart disease: Secondary | ICD-10-CM | POA: Diagnosis present

## 2015-02-23 DIAGNOSIS — Z79899 Other long term (current) drug therapy: Secondary | ICD-10-CM | POA: Diagnosis not present

## 2015-02-23 DIAGNOSIS — I5021 Acute systolic (congestive) heart failure: Secondary | ICD-10-CM | POA: Diagnosis not present

## 2015-02-23 DIAGNOSIS — N186 End stage renal disease: Secondary | ICD-10-CM | POA: Diagnosis present

## 2015-02-23 DIAGNOSIS — Z992 Dependence on renal dialysis: Secondary | ICD-10-CM

## 2015-02-23 DIAGNOSIS — J9622 Acute and chronic respiratory failure with hypercapnia: Secondary | ICD-10-CM | POA: Diagnosis present

## 2015-02-23 DIAGNOSIS — Z882 Allergy status to sulfonamides status: Secondary | ICD-10-CM

## 2015-02-23 DIAGNOSIS — Z91030 Bee allergy status: Secondary | ICD-10-CM | POA: Diagnosis not present

## 2015-02-23 DIAGNOSIS — I5023 Acute on chronic systolic (congestive) heart failure: Secondary | ICD-10-CM | POA: Diagnosis present

## 2015-02-23 DIAGNOSIS — J440 Chronic obstructive pulmonary disease with acute lower respiratory infection: Secondary | ICD-10-CM | POA: Diagnosis present

## 2015-02-23 DIAGNOSIS — I132 Hypertensive heart and chronic kidney disease with heart failure and with stage 5 chronic kidney disease, or end stage renal disease: Principal | ICD-10-CM | POA: Diagnosis present

## 2015-02-23 DIAGNOSIS — E46 Unspecified protein-calorie malnutrition: Secondary | ICD-10-CM | POA: Diagnosis present

## 2015-02-23 DIAGNOSIS — Z794 Long term (current) use of insulin: Secondary | ICD-10-CM | POA: Diagnosis not present

## 2015-02-23 DIAGNOSIS — Z881 Allergy status to other antibiotic agents status: Secondary | ICD-10-CM

## 2015-02-23 DIAGNOSIS — J189 Pneumonia, unspecified organism: Secondary | ICD-10-CM | POA: Diagnosis present

## 2015-02-23 DIAGNOSIS — J9620 Acute and chronic respiratory failure, unspecified whether with hypoxia or hypercapnia: Secondary | ICD-10-CM | POA: Diagnosis present

## 2015-02-23 DIAGNOSIS — J9621 Acute and chronic respiratory failure with hypoxia: Secondary | ICD-10-CM | POA: Diagnosis present

## 2015-02-23 DIAGNOSIS — N2581 Secondary hyperparathyroidism of renal origin: Secondary | ICD-10-CM | POA: Diagnosis present

## 2015-02-23 DIAGNOSIS — J80 Acute respiratory distress syndrome: Secondary | ICD-10-CM | POA: Diagnosis not present

## 2015-02-23 DIAGNOSIS — Z888 Allergy status to other drugs, medicaments and biological substances status: Secondary | ICD-10-CM

## 2015-02-23 DIAGNOSIS — J810 Acute pulmonary edema: Secondary | ICD-10-CM | POA: Diagnosis not present

## 2015-02-23 DIAGNOSIS — Z853 Personal history of malignant neoplasm of breast: Secondary | ICD-10-CM

## 2015-02-23 DIAGNOSIS — Z86711 Personal history of pulmonary embolism: Secondary | ICD-10-CM | POA: Diagnosis not present

## 2015-02-23 DIAGNOSIS — Z23 Encounter for immunization: Secondary | ICD-10-CM

## 2015-02-23 DIAGNOSIS — Z6820 Body mass index (BMI) 20.0-20.9, adult: Secondary | ICD-10-CM | POA: Diagnosis not present

## 2015-02-23 DIAGNOSIS — E785 Hyperlipidemia, unspecified: Secondary | ICD-10-CM | POA: Diagnosis present

## 2015-02-23 DIAGNOSIS — R0602 Shortness of breath: Secondary | ICD-10-CM | POA: Diagnosis not present

## 2015-02-23 DIAGNOSIS — Z7982 Long term (current) use of aspirin: Secondary | ICD-10-CM | POA: Diagnosis not present

## 2015-02-23 DIAGNOSIS — Z87891 Personal history of nicotine dependence: Secondary | ICD-10-CM

## 2015-02-23 DIAGNOSIS — J9602 Acute respiratory failure with hypercapnia: Secondary | ICD-10-CM

## 2015-02-23 DIAGNOSIS — K219 Gastro-esophageal reflux disease without esophagitis: Secondary | ICD-10-CM | POA: Diagnosis present

## 2015-02-23 DIAGNOSIS — J9601 Acute respiratory failure with hypoxia: Secondary | ICD-10-CM | POA: Diagnosis not present

## 2015-02-23 DIAGNOSIS — Z880 Allergy status to penicillin: Secondary | ICD-10-CM

## 2015-02-23 DIAGNOSIS — D631 Anemia in chronic kidney disease: Secondary | ICD-10-CM | POA: Diagnosis present

## 2015-02-23 LAB — BLOOD GAS, VENOUS
Acid-base deficit: 0.5 mmol/L (ref 0.0–2.0)
Bicarbonate: 30.2 mEq/L — ABNORMAL HIGH (ref 21.0–28.0)
Delivery systems: POSITIVE
PATIENT TEMPERATURE: 37
PCO2 VEN: 81 mmHg — AB (ref 44.0–60.0)
pH, Ven: 7.18 — CL (ref 7.320–7.430)
pO2, Ven: <31 mmHg (ref 30.0–45.0)

## 2015-02-23 LAB — GLUCOSE, CAPILLARY
GLUCOSE-CAPILLARY: 100 mg/dL — AB (ref 65–99)
GLUCOSE-CAPILLARY: 169 mg/dL — AB (ref 65–99)
Glucose-Capillary: 86 mg/dL (ref 65–99)

## 2015-02-23 LAB — CBC WITH DIFFERENTIAL/PLATELET
BASOS PCT: 1 %
Basophils Absolute: 0.1 10*3/uL (ref 0–0.1)
EOS ABS: 0.1 10*3/uL (ref 0–0.7)
EOS PCT: 1 %
HCT: 30.3 % — ABNORMAL LOW (ref 35.0–47.0)
HEMOGLOBIN: 9.5 g/dL — AB (ref 12.0–16.0)
Lymphocytes Relative: 33 %
Lymphs Abs: 5 10*3/uL — ABNORMAL HIGH (ref 1.0–3.6)
MCH: 31.9 pg (ref 26.0–34.0)
MCHC: 31.5 g/dL — AB (ref 32.0–36.0)
MCV: 101.4 fL — ABNORMAL HIGH (ref 80.0–100.0)
Monocytes Absolute: 0.7 10*3/uL (ref 0.2–0.9)
Monocytes Relative: 4 %
NEUTROS PCT: 61 %
Neutro Abs: 9.2 10*3/uL — ABNORMAL HIGH (ref 1.4–6.5)
PLATELETS: 452 10*3/uL — AB (ref 150–440)
RBC: 2.99 MIL/uL — AB (ref 3.80–5.20)
RDW: 18.5 % — ABNORMAL HIGH (ref 11.5–14.5)
WBC: 15.1 10*3/uL — AB (ref 3.6–11.0)

## 2015-02-23 LAB — BASIC METABOLIC PANEL
Anion gap: 12 (ref 5–15)
BUN: 40 mg/dL — ABNORMAL HIGH (ref 6–20)
CHLORIDE: 97 mmol/L — AB (ref 101–111)
CO2: 26 mmol/L (ref 22–32)
CREATININE: 5.78 mg/dL — AB (ref 0.44–1.00)
Calcium: 8.1 mg/dL — ABNORMAL LOW (ref 8.9–10.3)
GFR, EST AFRICAN AMERICAN: 8 mL/min — AB (ref >60)
GFR, EST NON AFRICAN AMERICAN: 7 mL/min — AB (ref >60)
Glucose, Bld: 188 mg/dL — ABNORMAL HIGH (ref 65–99)
POTASSIUM: 4.4 mmol/L (ref 3.5–5.1)
SODIUM: 135 mmol/L (ref 135–145)

## 2015-02-23 LAB — MRSA PCR SCREENING: MRSA BY PCR: NEGATIVE

## 2015-02-23 LAB — TROPONIN I
TROPONIN I: 0.06 ng/mL — AB (ref <0.031)
Troponin I: 0.4 ng/mL — ABNORMAL HIGH (ref <0.031)
Troponin I: 0.52 ng/mL — ABNORMAL HIGH (ref <0.031)

## 2015-02-23 LAB — BRAIN NATRIURETIC PEPTIDE: B NATRIURETIC PEPTIDE 5: 1894 pg/mL — AB (ref 0.0–100.0)

## 2015-02-23 LAB — MAGNESIUM: Magnesium: 1.7 mg/dL (ref 1.7–2.4)

## 2015-02-23 MED ORDER — SENNA 8.6 MG PO TABS
1.0000 | ORAL_TABLET | Freq: Two times a day (BID) | ORAL | Status: DC
  Administered 2015-02-23 – 2015-02-25 (×4): 8.6 mg via ORAL
  Filled 2015-02-23 (×5): qty 1

## 2015-02-23 MED ORDER — EPOETIN ALFA 4000 UNIT/ML IJ SOLN
4000.0000 [IU] | INTRAMUSCULAR | Status: DC
  Administered 2015-02-23 – 2015-02-25 (×2): 4000 [IU] via SUBCUTANEOUS
  Filled 2015-02-23 (×2): qty 1

## 2015-02-23 MED ORDER — SODIUM CHLORIDE 0.9 % IJ SOLN
3.0000 mL | Freq: Two times a day (BID) | INTRAMUSCULAR | Status: DC
  Administered 2015-02-23 – 2015-02-26 (×7): 3 mL via INTRAVENOUS

## 2015-02-23 MED ORDER — MOMETASONE FURO-FORMOTEROL FUM 100-5 MCG/ACT IN AERO
2.0000 | INHALATION_SPRAY | Freq: Two times a day (BID) | RESPIRATORY_TRACT | Status: DC
  Administered 2015-02-23 – 2015-02-26 (×6): 2 via RESPIRATORY_TRACT
  Filled 2015-02-23: qty 8.8

## 2015-02-23 MED ORDER — FUROSEMIDE 40 MG PO TABS: 40.0000 mg | ORAL_TABLET | Freq: Every day | ORAL | Status: DC

## 2015-02-23 MED ORDER — HYDRALAZINE HCL 20 MG/ML IJ SOLN
10.0000 mg | Freq: Four times a day (QID) | INTRAMUSCULAR | Status: DC | PRN
  Administered 2015-02-24: 10 mg via INTRAVENOUS
  Filled 2015-02-23 (×2): qty 1

## 2015-02-23 MED ORDER — POLYETHYLENE GLYCOL 3350 17 G PO PACK: 17.0000 g | PACK | Freq: Every day | ORAL | Status: DC | PRN

## 2015-02-23 MED ORDER — INSULIN ASPART 100 UNIT/ML ~~LOC~~ SOLN
0.0000 [IU] | Freq: Three times a day (TID) | SUBCUTANEOUS | Status: DC
  Administered 2015-02-25: 1 [IU] via SUBCUTANEOUS
  Filled 2015-02-23: qty 1

## 2015-02-23 MED ORDER — ALBUTEROL SULFATE HFA 108 (90 BASE) MCG/ACT IN AERS: 2.0000 | INHALATION_SPRAY | Freq: Four times a day (QID) | RESPIRATORY_TRACT | Status: DC | PRN

## 2015-02-23 MED ORDER — LEVOFLOXACIN IN D5W 750 MG/150ML IV SOLN
750.0000 mg | Freq: Once | INTRAVENOUS | Status: AC
  Administered 2015-02-23: 750 mg via INTRAVENOUS
  Filled 2015-02-23: qty 150

## 2015-02-23 MED ORDER — ASPIRIN EC 81 MG PO TBEC
81.0000 mg | DELAYED_RELEASE_TABLET | Freq: Every day | ORAL | Status: DC
  Administered 2015-02-24 – 2015-02-26 (×3): 81 mg via ORAL
  Filled 2015-02-23 (×3): qty 1

## 2015-02-23 MED ORDER — ALBUTEROL SULFATE (2.5 MG/3ML) 0.083% IN NEBU
2.5000 mg | INHALATION_SOLUTION | Freq: Four times a day (QID) | RESPIRATORY_TRACT | Status: DC | PRN
  Administered 2015-02-24: 2.5 mg via RESPIRATORY_TRACT
  Filled 2015-02-23: qty 3

## 2015-02-23 MED ORDER — VITAMIN D 1000 UNITS PO TABS
2000.0000 [IU] | ORAL_TABLET | Freq: Every day | ORAL | Status: DC
Start: 2015-02-24 — End: 2015-02-26
  Administered 2015-02-24 – 2015-02-26 (×3): 2000 [IU] via ORAL
  Filled 2015-02-23 (×3): qty 2

## 2015-02-23 MED ORDER — PNEUMOCOCCAL VAC POLYVALENT 25 MCG/0.5ML IJ INJ
0.5000 mL | INJECTION | INTRAMUSCULAR | Status: AC
  Administered 2015-02-24: 0.5 mL via INTRAMUSCULAR
  Filled 2015-02-23: qty 0.5

## 2015-02-23 MED ORDER — CHLORHEXIDINE GLUCONATE 0.12 % MT SOLN
15.0000 mL | Freq: Two times a day (BID) | OROMUCOSAL | Status: DC
  Administered 2015-02-23 – 2015-02-25 (×4): 15 mL via OROMUCOSAL
  Filled 2015-02-23 (×6): qty 15

## 2015-02-23 MED ORDER — HEPARIN SODIUM (PORCINE) 5000 UNIT/ML IJ SOLN
5000.0000 [IU] | Freq: Two times a day (BID) | INTRAMUSCULAR | Status: DC
  Administered 2015-02-23 – 2015-02-25 (×5): 5000 [IU] via SUBCUTANEOUS
  Filled 2015-02-23 (×5): qty 1

## 2015-02-23 MED ORDER — OXYCODONE HCL 5 MG PO TABS: 5.0000 mg | ORAL_TABLET | ORAL | Status: DC | PRN

## 2015-02-23 MED ORDER — ACETAMINOPHEN 325 MG PO TABS: 650.0000 mg | ORAL_TABLET | Freq: Four times a day (QID) | ORAL | Status: DC | PRN

## 2015-02-23 MED ORDER — IRBESARTAN 75 MG PO TABS
150.0000 mg | ORAL_TABLET | Freq: Two times a day (BID) | ORAL | Status: DC
  Administered 2015-02-23 – 2015-02-26 (×6): 150 mg via ORAL
  Filled 2015-02-23 (×6): qty 2

## 2015-02-23 MED ORDER — PANTOPRAZOLE SODIUM 40 MG PO TBEC
40.0000 mg | DELAYED_RELEASE_TABLET | Freq: Every day | ORAL | Status: DC
Start: 2015-02-24 — End: 2015-02-26
  Administered 2015-02-24 – 2015-02-26 (×3): 40 mg via ORAL
  Filled 2015-02-23 (×3): qty 1

## 2015-02-23 MED ORDER — ROSUVASTATIN CALCIUM 10 MG PO TABS
10.0000 mg | ORAL_TABLET | Freq: Every evening | ORAL | Status: DC
  Administered 2015-02-23 – 2015-02-25 (×3): 10 mg via ORAL
  Filled 2015-02-23 (×3): qty 1

## 2015-02-23 MED ORDER — SODIUM CHLORIDE 0.9 % IJ SOLN: 3.0000 mL | INTRAMUSCULAR | Status: DC | PRN

## 2015-02-23 MED ORDER — ONDANSETRON HCL 4 MG/2ML IJ SOLN: 4.0000 mg | Freq: Four times a day (QID) | INTRAMUSCULAR | Status: DC | PRN

## 2015-02-23 MED ORDER — CETYLPYRIDINIUM CHLORIDE 0.05 % MT LIQD
7.0000 mL | Freq: Two times a day (BID) | OROMUCOSAL | Status: DC
  Administered 2015-02-23 – 2015-02-25 (×4): 7 mL via OROMUCOSAL

## 2015-02-23 MED ORDER — LEVOFLOXACIN IN D5W 500 MG/100ML IV SOLN: 500.0000 mg | INTRAVENOUS | Status: DC

## 2015-02-23 MED ORDER — ENSURE ENLIVE PO LIQD
237.0000 mL | Freq: Two times a day (BID) | ORAL | Status: DC
  Administered 2015-02-25 – 2015-02-26 (×2): 237 mL via ORAL

## 2015-02-23 MED ORDER — LORAZEPAM 0.5 MG PO TABS
1.0000 mg | ORAL_TABLET | Freq: Every day | ORAL | Status: DC
  Administered 2015-02-23: 1 mg via ORAL
  Filled 2015-02-23: qty 2

## 2015-02-23 MED ORDER — IPRATROPIUM-ALBUTEROL 0.5-2.5 (3) MG/3ML IN SOLN
3.0000 mL | Freq: Once | RESPIRATORY_TRACT | Status: AC
  Administered 2015-02-23: 3 mL via RESPIRATORY_TRACT
  Filled 2015-02-23: qty 3

## 2015-02-23 MED ORDER — TIOTROPIUM BROMIDE MONOHYDRATE 18 MCG IN CAPS
18.0000 ug | ORAL_CAPSULE | Freq: Every day | RESPIRATORY_TRACT | Status: DC
  Administered 2015-02-23 – 2015-02-25 (×3): 18 ug via RESPIRATORY_TRACT
  Filled 2015-02-23: qty 5

## 2015-02-23 MED ORDER — METHYLPREDNISOLONE SODIUM SUCC 125 MG IJ SOLR
125.0000 mg | Freq: Once | INTRAMUSCULAR | Status: AC
  Administered 2015-02-23: 125 mg via INTRAVENOUS
  Filled 2015-02-23: qty 2

## 2015-02-23 MED ORDER — ALBUMIN HUMAN 25 % IV SOLN
12.5000 g | Freq: Once | INTRAVENOUS | Status: AC
  Administered 2015-02-23: 12.5 g via INTRAVENOUS
  Filled 2015-02-23: qty 50

## 2015-02-23 MED ORDER — ONDANSETRON HCL 4 MG PO TABS: 4.0000 mg | ORAL_TABLET | Freq: Four times a day (QID) | ORAL | Status: DC | PRN

## 2015-02-23 MED ORDER — MIRTAZAPINE 15 MG PO TABS
15.0000 mg | ORAL_TABLET | Freq: Every day | ORAL | Status: DC
  Administered 2015-02-23 – 2015-02-25 (×3): 15 mg via ORAL
  Filled 2015-02-23 (×3): qty 1

## 2015-02-23 MED ORDER — FUROSEMIDE 10 MG/ML IJ SOLN
40.0000 mg | Freq: Every day | INTRAMUSCULAR | Status: DC
  Administered 2015-02-23 – 2015-02-25 (×3): 40 mg via INTRAVENOUS
  Filled 2015-02-23 (×3): qty 4

## 2015-02-23 MED ORDER — METHYLPREDNISOLONE SODIUM SUCC 125 MG IJ SOLR
60.0000 mg | INTRAMUSCULAR | Status: DC
  Administered 2015-02-24: 60 mg via INTRAVENOUS
  Filled 2015-02-23: qty 2

## 2015-02-23 MED ORDER — ACETAMINOPHEN 650 MG RE SUPP: 650.0000 mg | Freq: Four times a day (QID) | RECTAL | Status: DC | PRN

## 2015-02-23 NOTE — Progress Notes (Signed)
*  PRELIMINARY RESULTS* Echocardiogram 2D Echocardiogram has been performed.  Lydia Clark 02/23/2015, 5:10 PM

## 2015-02-23 NOTE — ED Notes (Signed)
Family at bedside. 

## 2015-02-23 NOTE — Progress Notes (Signed)
   02/23/15 0715  Clinical Encounter Type  Visited With Patient and family together  Visit Type Initial;Spiritual support  Referral From Nurse  Consult/Referral To Chaplain  Spiritual Encounters  Spiritual Needs Emotional  Visited with PT ans son. Offered emotional/spiritual support. Dorthea Cove [phone]

## 2015-02-23 NOTE — ED Notes (Signed)
Family remains at bedside   resp less labored . Dr Candiss Norse at bedside

## 2015-02-23 NOTE — H&P (Signed)
[place] [facility] [place] NAME: Lydia Clark    MR#:  [id number]  DATE OF BIRTH:  1941/01/09  DATE OF ADMISSION:  02/23/2015  PRIMARY CARE PHYSICIAN Dr. Juleen China  REQUESTING/REFERRING PHYSICIAN: Dr. Jimmye Norman  CHIEF COMPLAINT:  Increasing shortness of breath with cough  HISTORY OF PRESENT ILLNESS:  Lydia Clark  is a 74 y.o. 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 Patient requires oxygen round-the-clock. Had not fever. Presented to the emergency room with increasing shortness of breath was found to be hypoxic and placed on BiPAP. Chest x-ray shows interval development of bilateral mid and lower lung pulmonary interstitial opacities compatible with pulmonary edema. She also has large amount of lower extremity edema. Underlying pneumonia cannot be ruled out. Her ABG shows mixed acid base disorder and severe respiratory acidosis She received IV Levaquin in the ER. She is being admitted for further evaluation of management of her acute on chronic hypoxic respiratory failure  PAST MEDICAL HISTORY:   Past Medical History  Diagnosis Date  . Allergy   . Cancer [facility]) 2008    left breast  . Hypertension 2006  . Personal history of tobacco use, presenting hazards to health 2012    30 yrs smoking  . Endocrine disorder 2008    thyroid  . Personal history of malignant neoplasm of breast 2008  . Breast screening, unspecified 2013  . Special screening for malignant neoplasms, colon 2013  . COPD (chronic obstructive pulmonary disease) ([place])   . PE (pulmonary embolism)     patient denies  . Swelling of throat     swelling at base of throat,  . Anxiety     anxiety  . Pneumonia     hx  . Heart  murmur     child  . History of kidney stones   . GERD (gastroesophageal reflux disease)   . Arthritis   . Multinodular goiter     followed by Dr. Carloyn Manner @ [facility]  . Dialysis patient ([place]) 2014  . Chronic kidney disease 2014    stage IV chronic     PAST SURGICAL HISTOIRY:   Past Surgical History  Procedure Laterality Date  . Breast surgery Left 2008    lumpectomy  . Fooy Left     lft foot  . Foot surgery  2005  . 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 Tonsillectomy  2005  . Breast biopsy  2005  . Appendectomy  2005  . Replacement total knee Left 2012    Partial   . Colonoscopy  2007    done in [place]  . Insertion of dialysis catheter Right 07/02/2012    Procedure: INSERTION OF DIALYSIS CATHETER;  Surgeon: Elam Dutch, MD;  Location: [place];  Service: Vascular;  Laterality: Right;  Right Internal Jugular  . Insertion of dialysis catheter Right July 02 2012    right chest/ temporary cath  . Hip arthroplasty Left 01/10/2015    Procedure: ARTHROPLASTY BIPOLAR HIP (HEMIARTHROPLASTY);  Surgeon: Claud Kelp, MD;  Location: [facility];  Service: Orthopedics;  Laterality: Left;  . Peripheral vascular catheterization N/A 01/13/2015    Procedure: Dialysis/Perma Catheter Insertion;  Surgeon: Algernon Huxley, MD;  Location: [facility];  Service: Cardiovascular;  Laterality: N/A;    SOCIAL HISTORY:   Social History  Substance Use Topics  . Smoking status: Current Every Day Smoker -- 0.30 packs/day for 30 years    Types: Cigarettes  . Smokeless tobacco: Never Used  . Alcohol Use: No    FAMILY HISTORY:   Family History  Problem Relation Age of Onset  . Cancer Other     breast cancer  . Heart attack Father   . Pneumonia Father     DRUG ALLERGIES:   Allergies  Allergen Reactions  . Bee Venom Swelling and Other (See Comments)    Breathing problems  . Biaxin [Clarithromycin] Other (See Comments)    Throat swells  .  Hydrocodone Nausea And Vomiting  . Mycostatin [Nystatin] Other (See Comments)    Blisters from the ointment  . Polysorbate     Other reaction(s): Unknown  . Sulfa Antibiotics Other (See Comments)    "welps"  . Penicillins Rash    REVIEW OF SYSTEMS:  Review of Systems  Constitutional: Positive for malaise/fatigue. Negative for fever, chills and weight loss.  HENT: Negative for ear discharge, ear pain and nosebleeds.   Eyes: Negative for blurred vision, pain and discharge.  Respiratory: Positive for cough and shortness of breath. Negative for sputum production, wheezing and stridor.   Cardiovascular: Negative for chest pain, palpitations, orthopnea and PND.  Gastrointestinal: Negative for nausea, vomiting, abdominal pain and diarrhea.  Genitourinary: Negative for urgency and frequency.  Musculoskeletal: Positive for back pain and joint pain.  Neurological: Positive for weakness. Negative for sensory change, speech change and focal weakness.  Psychiatric/Behavioral: Negative for depression and hallucinations. The patient is not nervous/anxious.   All other systems reviewed and are negative.    MEDICATIONS AT HOME:   Prior to Admission medications   Medication Sig Start Date End Date Taking? Authorizing Provider  albuterol (PROVENTIL HFA;VENTOLIN HFA) 108 (90 BASE) MCG/ACT inhaler Inhale 2 puffs into the lungs every 6 (six) hours as needed for wheezing or shortness of breath.   Yes Historical Provider, MD  aspirin 81 MG tablet Take 81 mg by mouth daily.   Yes Historical Provider, MD  Cholecalciferol (VITAMIN D3) 2000 UNITS TABS Take 1 tablet by mouth daily.   Yes Historical Provider, MD  feeding supplement, ENSURE ENLIVE, (ENSURE ENLIVE) LIQD Take 237 mLs by mouth 2 (two) times daily between meals. 01/14/15  Yes Dustin Flock, MD  fexofenadine (ALLEGRA) 60 MG tablet Take 60 mg by mouth 2 (two) times daily.  12/30/13  Yes Historical Provider, MD  Fluticasone-Salmeterol (ADVAIR DISKUS)  250-50 MCG/DOSE AEPB Inhale 1 puff into the lungs every 12 (twelve) hours. 08/07/14 08/07/15 Yes Historical Provider, MD  furosemide (LASIX) 40 MG tablet Take 40 mg by mouth daily. 12/31/14  Yes Historical Provider, MD  gentamicin ointment (GARAMYCIN) 0.1 % Apply 1 application topically at bedtime.  12/26/13  Yes Historical Provider, MD  LORazepam (ATIVAN) 1 MG tablet Take 1 tablet (1 mg total) by mouth at bedtime as needed for anxiety. Patient taking differently: Take 1 mg by mouth 2 (two) times daily.  01/14/15  Yes Dustin Flock, MD  mirtazapine (REMERON) 15 MG tablet Take 15 mg by mouth at bedtime. 12/31/14  Yes Historical Provider, MD  omeprazole (PRILOSEC) 20 MG capsule Take 20 mg by mouth daily.   Yes Historical Provider, MD  rosuvastatin (CRESTOR) 10 MG tablet Take 10 mg by mouth every evening.    Yes Historical Provider, MD  tiotropium (SPIRIVA) 18 MCG inhalation capsule Place 18 mcg into inhaler and inhale at bedtime.    Yes Historical Provider, MD  valsartan (DIOVAN) 160 MG tablet Take 160 mg by mouth 2 (two) times daily.  01/01/15  Yes Historical Provider, MD  acetaminophen (TYLENOL) 325 MG tablet Take 2 tablets (650 mg total) by mouth every 6 (six) hours as needed for mild pain (or Fever >/= 101). 01/14/15   Dustin Flock, MD  levofloxacin (LEVAQUIN) 250 MG tablet Take 2 tablets by mouth every other day. 02/20/15   Historical Provider, MD  predniSONE (STERAPRED UNI-PAK 21 TAB) 5 MG (21) TBPK tablet Take 1 tablet by mouth See admin instructions. Needs to take last dose today 02/23/15 02/17/15   Historical Provider, MD      VITAL SIGNS:  Blood pressure 148/83, pulse 89, temperature 98.7 F (37.1 C), temperature source Oral, resp. rate 23, height 5\' 7"  (1.702 m), weight 60.1 kg (132 lb 7.9 oz), SpO2 97 %.  PHYSICAL EXAMINATION:  GENERAL:  74 y.o.-year-old patient lying in the bed with mild to moderate acute distress. Critically ill, thin cachectic EYES: Pupils equal, round, reactive to  light and accommodation. No scleral icterus. Extraocular muscles intact.  HEENT: Head atraumatic, normocephalic. Oropharynx and nasopharynx clear.  NECK:  Supple, no jugular venous distention. No thyroid enlargement, no tenderness.  LUNGS: Coarse distant breath sounds bilaterally, no wheezing, rales,rhonchi or crepitation. No use of accessory muscles of respiration.  CARDIOVASCULAR: S1, S2 normal. No murmurs, rubs, or gallops. Mild tachycardia ABDOMEN: Soft, nontender, nondistended. Bowel sounds present. No organomegaly or mass.  EXTREMITIES:+++ pedal edema, cyanosis, or clubbing.  NEUROLOGIC: Cranial nerves II through XII are intact. Muscle strength 5/5 in all extremities. Sensation intact. Gait not checked. Generalized weakness PSYCHIATRIC: The patient is alert and oriented x 3.  SKIN: No obvious rash, lesion, or ulcer.   LABORATORY PANEL:   CBC  Recent Labs Lab 02/23/15 0727  WBC 15.1*  HGB 9.5*  HCT 30.3*  PLT 452*   ------------------------------------------------------------------------------------------------------------------  Chemistries   Recent Labs Lab 02/23/15 0727  NA 135  K 4.4  CL 97*  CO2 26  GLUCOSE 188*  BUN 40*  CREATININE 5.78*  CALCIUM 8.1*   ------------------------------------------------------------------------------------------------------------------  Cardiac Enzymes  Recent Labs Lab 02/23/15 0727  TROPONINI 0.06*   ------------------------------------------------------------------------------------------------------------------  RADIOLOGY:  Dg Chest Port 1 View  02/23/2015  CLINICAL DATA:  Patient with shortness of breath. History of COPD. Renal failure. EXAM: PORTABLE CHEST 1 VIEW COMPARISON:  Chest radiograph 01/09/2015. FINDINGS: Central venous catheter tip projects over the superior vena cava. Monitoring leads overlie the patient. Stable cardiac and mediastinal contours. Interval development of bilateral predominately mid and lower  lung interstitial pulmonary opacities. Small bilateral pleural effusions. IMPRESSION: Interval development bilateral mid lower lung interstitial pulmonary opacities most compatible with pulmonary edema with associated small effusions. Electronically Signed   By: Lovey Newcomer M.D.   On: 02/23/2015 07:46    EKG:  Sinus rhythm. No acute ST-T changes.  IMPRESSION AND PLAN:   Lydia Clark  is a 74 y.o. 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 Patient requires oxygen round-the-clock. Had not fever.  1. Acute on chronic hypoxic respiratory failure secondary to pulmonary edema with volume overload. Admit patient to ICU. Continue BiPAP We'll  continue empiric antibiotic with IV Levaquin IV Solu-Medrol daily Breathing treatment. Try to wean her off BiPAP and placed on nasal cannula as tolerated.  2. Acute pulmonary edema as noted on chest x-ray and clinical presentation. Patient has no history of CHF according to old records. She likely has now Echocardiogram of the heart Continue IV Lasix 40 daily Patient to get dialysis with ultrafiltration Continue Diovan  3. End-stage renal disease on  Hemodialysis Patient to get urgent dialysis today for with ultrafiltration given her one overload Seen by nephrology in the emergency room appreciate consult.  4. Hypertension continue Diovan  5. Protein calorie malnutrition we'll have dietitian see patient.  6. Hyperlipidemia on Crestor  7. DVT prophylaxis subcutaneous Lovenox    All the records are reviewed and case discussed with ED provider. Management plans discussed with the patient, family and they are in agreement.  CODE STATUS: Full  TOTAL CRITICAL TIME TAKING CARE OF THIS PATIENT: 50  minutes.    Takuya Lariccia M.D on 02/23/2015 at 11:46 AM  Between 7am to 6pm - Pager - [phone]  After 6pm go to [email or web address] - password [email or web address] [facility]  Office  [phone]  CC: Primary care physician; Pcp Not In System

## 2015-02-23 NOTE — ED Notes (Signed)
Pt has dialysis access to right upper chest  Intact.

## 2015-02-23 NOTE — Progress Notes (Signed)
Subjective:  Urgent consult requested by the emergency room for evaluate patient for emergency dialysis Patient is a 74 year old Caucasian female with end-stage renal disease who currently dialyzes at [facility] on a Tuesday/Thursday/Saturday schedule. She was doing peritoneal dialysis at home until recently but had to be converted to hemodialysis because of hospitalization at a rehabilitation facility after a hip fracture. Her son reports that now she is at home. All information is obtained from patient's son, Gerald Stabs.  Her last dialysis treatment was Saturday. Over the weekend, she progressively got short of breath. She requires oxygen on an around-the-clock basis. She has not had any fever. She has had increasing cough. She presented to the emergency room for worsening shortness of breath Chest x-ray shows interval development of bilateral mid and lower lung interstitial pulmonary opacities compatible with pulmonary edema. Patient also has large amount of lower extremity edema Currently the emergency room she is requiring BiPAP Her ABG shows mixed acid base disorder with severe resp acidosis  Objective:  Vital signs in last 24 hours:  Temp:  [97 F (36.1 C)] 97 F (36.1 C) (12/20 0755) Pulse Rate:  [99-129] 99 (12/20 0909) Resp:  [35-36] 36 (12/20 0755) BP: (140-181)/(78-118) 140/78 mmHg (12/20 0909) SpO2:  [90 %-96 %] 96 % (12/20 0909) FiO2 (%):  [40 %] 40 % (12/20 0730) Weight:  [59.875 kg (132 lb)] 59.875 kg (132 lb) (12/20 0725)  Weight change:  Filed Weights   02/23/15 0725  Weight: 59.875 kg (132 lb)    Intake/Output:   No intake or output data in the 24 hours ending 02/23/15 0940   Physical Exam: General:  critically ill-appearing   HEENT  BiPAP mask in place   Neck  supple   Pulm/lungs  diffuse bilateral crackles, requiring NIPPV   CVS/Heart  irregular rhythm, no rub or gallop   Abdomen:   soft, nontender, nondistended   Extremities:  2-3+ pitting  edema   Neurologic:  able to answer a few questions   Skin:  no acute rashes    Access: right IJ PermCath, PD catheter in place       Basic Metabolic Panel:   Recent Labs Lab 02/23/15 0727  NA 135  K 4.4  CL 97*  CO2 26  GLUCOSE 188*  BUN 40*  CREATININE 5.78*  CALCIUM 8.1*     CBC:  Recent Labs Lab 02/23/15 0727  WBC 15.1*  NEUTROABS 9.2*  HGB 9.5*  HCT 30.3*  MCV 101.4*  PLT 452*      Microbiology:  No results found for this or any previous visit (from the past 720 hour(s)).  Coagulation Studies: No results for input(s): LABPROT, INR in the last 72 hours.  Urinalysis: No results for input(s): COLORURINE, LABSPEC, PHURINE, GLUCOSEU, HGBUR, BILIRUBINUR, KETONESUR, PROTEINUR, UROBILINOGEN, NITRITE, LEUKOCYTESUR in the last 72 hours.  Invalid input(s): APPERANCEUR    Imaging: Dg Chest Port 1 View  02/23/2015  CLINICAL DATA:  Patient with shortness of breath. History of COPD. Renal failure. EXAM: PORTABLE CHEST 1 VIEW COMPARISON:  Chest radiograph 01/09/2015. FINDINGS: Central venous catheter tip projects over the superior vena cava. Monitoring leads overlie the patient. Stable cardiac and mediastinal contours. Interval development of bilateral predominately mid and lower lung interstitial pulmonary opacities. Small bilateral pleural effusions. IMPRESSION: Interval development bilateral mid lower lung interstitial pulmonary opacities most compatible with pulmonary edema with associated small effusions. Electronically Signed   By: Lovey Newcomer M.D.   On: 02/23/2015 07:46  Medications:   . levofloxacin (LEVAQUIN) IV 750 mg (02/23/15 0909)       Assessment/ Plan:  74 y.o. Caucasian female with end-stage renal disease on peritoneal dialysis, hypertension, COPD, GERD, hyperlipidemia, generalized anxiety disorder, history depression, anemia of chronic kidney disease, secondary hyperparathyroidism and osteopenia.  1. End Stage Renal Disease: currently on  hemodialysis.  - Urgent hemodialysis today in the ICU for volume removal - We'll get her PD catheter evaluated while in the hospital   2. Anemia of chronic kidney disease:  - We will continue Procrit with dialysis  - Hemoglobin 9.5   3. Secondary Hyperparathyroidism:  - monitor phosphorus during the hospitalization   4. Peripheral edema - Volume removal with dialysis       LOS:  Tivon Lemoine 12/20/20169:40 AM

## 2015-02-23 NOTE — Progress Notes (Signed)
HD tx completed.

## 2015-02-23 NOTE — ED Provider Notes (Signed)
[facility] Emergency Department Provider Note  L5 caveat: Review of systems and history is limited by respiratory distress   Time seen: ----------------------------------------- 7:26 AM on 02/23/2015 -----------------------------------------    I have reviewed the triage vital signs and the nursing notes.   HISTORY  Chief Complaint Respiratory Distress    HPI Lydia Clark is a 74 y.o. female brought the ER by EMS for acute respiratory distress. According to report she began having severe difficulty breathing this morning, reportedly is due for dialysis this morning. She complains of difficulty breathing, on CPAP in route sats did not reach 90%. Patient presents on CPAP still felt like she can't breathe.   Past Medical History  Diagnosis Date  . Allergy   . Cancer [facility]) 2008    left breast  . Hypertension 2006  . Personal history of tobacco use, presenting hazards to health 2012    30 yrs smoking  . Endocrine disorder 2008    thyroid  . Personal history of malignant neoplasm of breast 2008  . Breast screening, unspecified 2013  . Special screening for malignant neoplasms, colon 2013  . COPD (chronic obstructive pulmonary disease) ([place])   . PE (pulmonary embolism)     patient denies  . Swelling of throat     swelling at base of throat,  . Anxiety     anxiety  . Pneumonia     hx  . Heart murmur     child  . History of kidney stones   . GERD (gastroesophageal reflux disease)   . Arthritis   . Multinodular goiter     followed by Dr. Carloyn Manner @ [facility]  . Dialysis patient ([place]) 2014  . Chronic kidney disease 2014    stage IV chronic     Patient Active Problem List   Diagnosis Date Noted  . Femoral neck fracture, left, closed, initial encounter 01/09/2015  . Cough 04/01/2014  . COPD, mild ([place]) 04/01/2014  . Tobacco use disorder 04/01/2014  . History of breast cancer, left Stage 1 invasive, ER/PR p[os, Her 2 neg  07/23/2012    Past Surgical History  Procedure Laterality Date  . Breast surgery Left 2008    lumpectomy  . Fooy Left     lft foot  . Foot surgery  2005  . 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 Tonsillectomy  2005  . Breast biopsy  2005  . Appendectomy  2005  . Replacement total knee Left 2012    Partial   . Colonoscopy  2007    done in [place]  . Insertion of dialysis catheter Right 07/02/2012    Procedure: INSERTION OF DIALYSIS CATHETER;  Surgeon: Elam Dutch, MD;  Location: [place];  Service: Vascular;  Laterality: Right;  Right Internal Jugular  . Insertion of dialysis catheter Right July 02 2012    right chest/ temporary cath  . Hip arthroplasty Left 01/10/2015    Procedure: ARTHROPLASTY BIPOLAR HIP (HEMIARTHROPLASTY);  Surgeon: Claud Kelp, MD;  Location: [facility];  Service: Orthopedics;  Laterality: Left;  . Peripheral vascular catheterization N/A 01/13/2015    Procedure: Dialysis/Perma Catheter Insertion;  Surgeon: Algernon Huxley, MD;  Location: [place] [facility];  Service: Cardiovascular;  Laterality: N/A;    Allergies Bee venom; Biaxin; Mycostatin; Polysorbate; Sulfa antibiotics; and Penicillins  Social History Social History  Substance Use Topics  . Smoking status: Current Every Day Smoker -- 0.30 packs/day for  30 years    Types: Cigarettes  . Smokeless tobacco: Never Used  . Alcohol Use: No    Review of Systems Constitutional: Negative for fever. Cardiovascular: Negative for chest pain. Respiratory: Positive for difficulty breathing Gastrointestinal: Negative for abdominal pain, vomiting and diarrhea.  10-point ROS otherwise negative or unknown  ____________________________________________   PHYSICAL EXAM:  VITAL SIGNS: ED Triage Vitals  Enc Vitals Group     BP --      Pulse --      Resp --      Temp --      Temp src --      SpO2 --      Weight --      Height --      Head Cir --      Peak Flow --      Pain Score --       Pain Loc --      Pain Edu? --      Excl. in [place]? --     Constitutional: Alert, ill appearing chronically, moderate distress. Eyes: Conjunctivae are normal. PERRL. Normal extraocular movements. ENT   Head: Normocephalic and atraumatic.   Nose: No congestion/rhinnorhea.   Mouth/Throat: Mucous membranes are moist.   Neck: No stridor. Cardiovascular: Rapid rate, regular rhythm. Normal and symmetric distal pulses are present in all extremities.  Respiratory: Tachypnea with wheezing and basilar rales Gastrointestinal: Soft and nontender. Distended, peritoneal dialysis catheter is present. Musculoskeletal: Nontennder with normal range of motion in all extremities, pitting edema on bilateral lower extremities. Neurologic:  Normal speech and language. No gross focal neurologic deficits are appreciated. Skin:  Skin is warm, dry and intact. No rash noted. ____________________________________________  EKG: Interpreted by me. Sinus tachycardia with a rate of 122 bpm, PACs, right bundle branch block, nonspecific ST and T-wave changes. Normal axis.  ____________________________________________  ED COURSE:  Pertinent labs & imaging results that were available during my care of the patient were reviewed by me and considered in my medical decision making (see chart for details). Patient presents in moderate to severe respiratory distress. She may require intubation. Patient states she's not needed intubation the past ____________________________________________    LABS (pertinent positives/negatives)  Labs Reviewed  CBC WITH DIFFERENTIAL/PLATELET - Abnormal; Notable for the following:    WBC 15.1 (*)    RBC 2.99 (*)    Hemoglobin 9.5 (*)    HCT 30.3 (*)    MCV 101.4 (*)    MCHC 31.5 (*)    RDW 18.5 (*)    Platelets 452 (*)    Neutro Abs 9.2 (*)    Lymphs Abs 5.0 (*)    All other components within normal limits  BASIC METABOLIC PANEL - Abnormal; Notable for the following:     Chloride 97 (*)    Glucose, Bld 188 (*)    BUN 40 (*)    Creatinine, Ser 5.78 (*)    Calcium 8.1 (*)    GFR calc non Af Amer 7 (*)    GFR calc Af Amer 8 (*)    All other components within normal limits  BRAIN NATRIURETIC PEPTIDE - Abnormal; Notable for the following:    B Natriuretic Peptide 1894.0 (*)    All other components within normal limits  TROPONIN I - Abnormal; Notable for the following:    Troponin I 0.06 (*)    All other components within normal limits  BLOOD GAS, VENOUS - Abnormal; Notable for the following:    pH,  Ven 7.18 (*)    pCO2, Ven 81 (*)    Bicarbonate 30.2 (*)    All other components within normal limits  CULTURE, BLOOD (ROUTINE X 2)  CULTURE, BLOOD (ROUTINE X 2)    RADIOLOGY Images were viewed by me  Chest x-ray IMPRESSION: Interval development bilateral mid lower lung interstitial pulmonary opacities most compatible with pulmonary edema with associated small effusions.  EKG: Interpreted by me. Sinus tachycardia with a rate of 122 bpm, normal PR interval, widened QRS, right bundle branch block, normal QT interval. ____________________________________________  CRITICAL CARE Performed by: Earleen Newport   Total critical care time: 30 minutes  Critical care time was exclusive of separately billable procedures and treating other patients.  Critical care was necessary to treat or prevent imminent or life-threatening deterioration.  Critical care was time spent personally by me on the following activities: development of treatment plan with patient and/or surrogate as well as nursing, discussions with consultants, evaluation of patient's response to treatment, examination of patient, obtaining history from patient or surrogate, ordering and performing treatments and interventions, ordering and review of laboratory studies, ordering and review of radiographic studies, pulse oximetry and re-evaluation of patient's condition.   FINAL ASSESSMENT  AND PLAN  Acute respiratory failure with hypoxia and hypercarbia  Plan: Patient with labs and imaging as dictated above. I discussed the patient with nephrology on-call Dr. Candiss Norse who will arrange for dialysis for the patient. She will receive IV Levaquin to cover for pneumonia as well. Family states she does not make much urine so I will forego trying to diurese her well in the ER. She continues on BiPAP and is improving. She will need a repeat blood gas.   Earleen Newport, MD   Earleen Newport, MD 02/23/15 [phone]

## 2015-02-23 NOTE — Progress Notes (Signed)
CRITICAL VALUE ALERT  Critical value received:  Troponin 0.40  Date of notification:  02/23/2015 Time of notification:  1500  Critical value read back:Yes.    Nurse who received alert:  Bridgette Habermann RN  MD notified (1st page):  Dr. Posey Pronto  Time of first page:  1500  MD notified (2nd page):  Time of second page:  Responding MD:  Dr. Posey Pronto  Time MD responded:  1500

## 2015-02-23 NOTE — Progress Notes (Signed)
CRITICAL VALUE ALERT  Critical value received: trop 0.52  Date of notification:  12/20  Time of notification:  [id number]  Critical value read back:Yes.    Nurse who received alert:  Edilberto Roosevelt  MD notified (1st page):  Posey Pronto, [facility]  Time of first page:  [phone]  MD notified (2nd page):  Time of second page:  Responding MD:  Ulysees Barns  Time MD responded:  [phone]

## 2015-02-23 NOTE — Progress Notes (Signed)
ANTIBIOTIC CONSULT NOTE - INITIAL  Pharmacy Consult for Levaquin Indication: rule out pneumonia  Allergies  Allergen Reactions  . Bee Venom Swelling and Other (See Comments)    Breathing problems  . Biaxin [Clarithromycin] Other (See Comments)    Throat swells  . Hydrocodone Nausea And Vomiting  . Mycostatin [Nystatin] Other (See Comments)    Blisters from the ointment  . Polysorbate     Other reaction(s): Unknown  . Sulfa Antibiotics Other (See Comments)    "welps"  . Penicillins Rash    Patient Measurements: Height: 5\' 7"  (170.2 cm) Weight: 132 lb 7.9 oz (60.1 kg) IBW/kg (Calculated) : 61.6  Vital Signs: Temp: 98.7 F (37.1 C) (12/20 1100) Temp Source: Oral (12/20 1100) BP: 148/83 mmHg (12/20 1100) Pulse Rate: 89 (12/20 1145) Intake/Output from previous day:   Intake/Output from this shift:    Labs:  Recent Labs  02/23/15 0727  WBC 15.1*  HGB 9.5*  PLT 452*  CREATININE 5.78*   Estimated Creatinine Clearance: 8.3 mL/min (by C-G formula based on Cr of 5.78). No results for input(s): VANCOTROUGH, VANCOPEAK, VANCORANDOM, GENTTROUGH, GENTPEAK, GENTRANDOM, TOBRATROUGH, TOBRAPEAK, TOBRARND, AMIKACINPEAK, AMIKACINTROU, AMIKACIN in the last 72 hours.   Microbiology: Recent Results (from the past 720 hour(s))  Blood culture (routine x 2)     Status: None (Preliminary result)   Collection Time: 02/23/15  7:28 AM  Result Value Ref Range Status   Specimen Description BLOOD RIGHT AC  Final   Special Requests BOTTLES DRAWN AEROBIC AND ANAEROBIC 2ML  Final   Culture NO GROWTH < 12 HOURS  Final   Report Status PENDING  Incomplete  Blood culture (routine x 2)     Status: None (Preliminary result)   Collection Time: 02/23/15  7:34 AM  Result Value Ref Range Status   Specimen Description BLOOD RIGHT HAND  Final   Special Requests BOTTLES DRAWN AEROBIC AND ANAEROBIC 7ML  Final   Culture NO GROWTH < 12 HOURS  Final   Report Status PENDING  Incomplete  MRSA PCR Screening      Status: None   Collection Time: 02/23/15 11:00 AM  Result Value Ref Range Status   MRSA by PCR NEGATIVE NEGATIVE Final    Comment:        The GeneXpert MRSA Assay (FDA approved for NASAL specimens only), is one component of a comprehensive MRSA colonization surveillance program. It is not intended to diagnose MRSA infection nor to guide or monitor treatment for MRSA infections.     Medical History: Past Medical History  Diagnosis Date  . Allergy   . Cancer [facility]) 2008    left breast  . Hypertension 2006  . Personal history of tobacco use, presenting hazards to health 2012    30 yrs smoking  . Endocrine disorder 2008    thyroid  . Personal history of malignant neoplasm of breast 2008  . Breast screening, unspecified 2013  . Special screening for malignant neoplasms, colon 2013  . COPD (chronic obstructive pulmonary disease) ([place])   . PE (pulmonary embolism)     patient denies  . Swelling of throat     swelling at base of throat,  . Anxiety     anxiety  . Pneumonia     hx  . Heart murmur     child  . History of kidney stones   . GERD (gastroesophageal reflux disease)   . Arthritis   . Multinodular goiter     followed by Dr. Carloyn Manner @ [place]  . Dialysis patient [facility]) 2014  . Chronic kidney disease 2014    stage IV chronic     Medications:  Scheduled:  . albumin human  12.5 g Intravenous Once  . [START ON 02/24/2015] aspirin EC  81 mg Oral Daily  . [START ON 02/24/2015] cholecalciferol  2,000 Units Oral Daily  . epoetin (EPOGEN/PROCRIT) injection  4,000 Units Subcutaneous Q T,Th,Sa-HD  . feeding supplement (ENSURE ENLIVE)  237 mL Oral BID BM  . furosemide  40 mg Intravenous Daily  . heparin  5,000 Units Subcutaneous Q12H  . insulin aspart  0-9 Units Subcutaneous TID WC  . irbesartan  150 mg Oral BID  . [START ON 02/25/2015] levofloxacin (LEVAQUIN) IV  500 mg Intravenous Q48H  . methylPREDNISolone (SOLU-MEDROL) injection  60 mg Intravenous  Q24H  . mirtazapine  15 mg Oral QHS  . mometasone-formoterol  2 puff Inhalation BID  . [START ON 02/24/2015] pantoprazole  40 mg Oral Daily  . [START ON 02/24/2015] pneumococcal 23 valent vaccine  0.5 mL Intramuscular Tomorrow-1000  . rosuvastatin  10 mg Oral QPM  . senna  1 tablet Oral BID  . sodium chloride  3 mL Intravenous Q12H  . tiotropium  18 mcg Inhalation QHS   Infusions:    Assessment: 74 y/o F with a h/o ESRD on HD admitted with respiratory failure, possible PNA.   Plan:  Levaquin 750 mg iv once given in ED. Will continue Levaquin 500 mg iv q 48 hours. Will continue to follow culture results.    Ulice Dash D 02/23/2015,1:00 PM

## 2015-02-23 NOTE — Care Management Note (Signed)
Patient is active at [facility] on a TTS schedule.  I have sent admission records and will send additional records at discharge.  Iran Sizer  Dialysis Liaison  [phone]

## 2015-02-23 NOTE — ED Notes (Signed)
Presents via ems with increased resp distress this am

## 2015-02-23 NOTE — Progress Notes (Signed)
PT Cancellation Note  Patient Details Name: Lydia Clark MRN: [id number] DOB: [birth year]   Cancelled Treatment:    Reason Eval/Treat Not Completed: Medical issues which prohibited therapy (Consult received and chart reviewed.  Patient currently on BiPAP, pending urgent dialysis.  Will hold this date and re-attempt next date as medically appropriate.)  Jaquel Coomer H. Owens Shark, PT, DPT, NCS 02/23/2015, 1:03 PM [phone]

## 2015-02-24 DIAGNOSIS — I132 Hypertensive heart and chronic kidney disease with heart failure and with stage 5 chronic kidney disease, or end stage renal disease: Secondary | ICD-10-CM | POA: Diagnosis not present

## 2015-02-24 LAB — BASIC METABOLIC PANEL
ANION GAP: 12 (ref 5–15)
BUN: 31 mg/dL — ABNORMAL HIGH (ref 6–20)
CALCIUM: 8.2 mg/dL — AB (ref 8.9–10.3)
CO2: 26 mmol/L (ref 22–32)
Chloride: 97 mmol/L — ABNORMAL LOW (ref 101–111)
Creatinine, Ser: 3.99 mg/dL — ABNORMAL HIGH (ref 0.44–1.00)
GFR, EST AFRICAN AMERICAN: 12 mL/min — AB (ref >60)
GFR, EST NON AFRICAN AMERICAN: 10 mL/min — AB (ref >60)
Glucose, Bld: 125 mg/dL — ABNORMAL HIGH (ref 65–99)
Potassium: 4.4 mmol/L (ref 3.5–5.1)
Sodium: 135 mmol/L (ref 135–145)

## 2015-02-24 LAB — CBC WITH DIFFERENTIAL/PLATELET
BASOS ABS: 0 10*3/uL (ref 0–0.1)
BASOS PCT: 0 %
EOS ABS: 0 10*3/uL (ref 0–0.7)
Eosinophils Relative: 0 %
HCT: 30 % — ABNORMAL LOW (ref 35.0–47.0)
HEMOGLOBIN: 9.3 g/dL — AB (ref 12.0–16.0)
Lymphocytes Relative: 7 %
Lymphs Abs: 0.8 10*3/uL — ABNORMAL LOW (ref 1.0–3.6)
MCH: 31 pg (ref 26.0–34.0)
MCHC: 31.1 g/dL — ABNORMAL LOW (ref 32.0–36.0)
MCV: 99.5 fL (ref 80.0–100.0)
Monocytes Absolute: 0.7 10*3/uL (ref 0.2–0.9)
Monocytes Relative: 6 %
NEUTROS PCT: 87 %
Neutro Abs: 9.8 10*3/uL — ABNORMAL HIGH (ref 1.4–6.5)
Platelets: 419 10*3/uL (ref 150–440)
RBC: 3.02 MIL/uL — AB (ref 3.80–5.20)
RDW: 17.9 % — ABNORMAL HIGH (ref 11.5–14.5)
WBC: 11.4 10*3/uL — AB (ref 3.6–11.0)

## 2015-02-24 LAB — GLUCOSE, CAPILLARY
GLUCOSE-CAPILLARY: 81 mg/dL (ref 65–99)
GLUCOSE-CAPILLARY: 169 mg/dL — AB (ref 65–99)
Glucose-Capillary: 117 mg/dL — ABNORMAL HIGH (ref 65–99)

## 2015-02-24 LAB — HEPATITIS B SURFACE ANTIGEN: Hepatitis B Surface Ag: NEGATIVE

## 2015-02-24 LAB — TROPONIN I: Troponin I: 0.33 ng/mL — ABNORMAL HIGH (ref <0.031)

## 2015-02-24 MED ORDER — LORAZEPAM 2 MG/ML IJ SOLN: 1.0000 mg | Freq: Two times a day (BID) | INTRAMUSCULAR | Status: DC

## 2015-02-24 MED ORDER — BENZONATATE 100 MG PO CAPS
200.0000 mg | ORAL_CAPSULE | Freq: Three times a day (TID) | ORAL | Status: DC | PRN
  Administered 2015-02-24: 200 mg via ORAL
  Filled 2015-02-24: qty 2

## 2015-02-24 MED ORDER — ALTEPLASE 2 MG IJ SOLR
2.0000 mg | Freq: Once | INTRAMUSCULAR | Status: AC
  Administered 2015-02-24: 2 mg
  Filled 2015-02-24: qty 2

## 2015-02-24 MED ORDER — GUAIFENESIN-DM 100-10 MG/5ML PO SYRP
5.0000 mL | ORAL_SOLUTION | ORAL | Status: DC | PRN
Start: 2015-02-24 — End: 2015-02-26
  Administered 2015-02-24: 5 mL via ORAL
  Filled 2015-02-24: qty 5

## 2015-02-24 MED ORDER — CEPASTAT 14.5 MG MT LOZG
1.0000 | LOZENGE | OROMUCOSAL | Status: DC | PRN
Start: 2015-02-24 — End: 2015-02-26
  Administered 2015-02-24: 1 via BUCCAL
  Filled 2015-02-24: qty 9

## 2015-02-24 MED ORDER — LORAZEPAM 1 MG PO TABS
1.0000 mg | ORAL_TABLET | Freq: Two times a day (BID) | ORAL | Status: DC
  Administered 2015-02-24 – 2015-02-26 (×5): 1 mg via ORAL
  Filled 2015-02-24 (×4): qty 1
  Filled 2015-02-24: qty 2

## 2015-02-24 NOTE — Progress Notes (Signed)
MD, Dr. Jannifer Franklin notified due to pt cough.  Pt has congested cough- able to cough up some frothy sputum.  MD to place orders for mucinex/cough syrup.  Will also give a breathing treatment to help with wheeze.  Will continue to monitor. Jessee Avers

## 2015-02-24 NOTE — Progress Notes (Addendum)
Per Dr. Candiss Norse- hold off on blood pressure medications (pt BP in [id number] and [id number] systolic) at this time due to patient to have dialysis later today.

## 2015-02-24 NOTE — Clinical Social Work Note (Signed)
Clinical Social Work Assessment  Patient Details  Name: Lydia Clark MRN: [id number] Date of Birth: [birth year]  Date of referral:  02/24/15               Reason for consult:  Discharge Planning                Permission sought to share information with:  Family Supports Permission granted to share information::  Yes, Verbal Permission Granted  Name::        Agency::     Relationship::     Contact Information:     Housing/Transportation Living arrangements for the past 2 months:  [place], [place] of Information:  Patient, Adult Children Patient Interpreter Needed:  None Criminal Activity/Legal Involvement Pertinent to Current Situation/Hospitalization:  No - Comment as needed Significant Relationships:  Adult Children Lives with:  Self Do you feel safe going back to the place where you live?  Yes Need for family participation in patient care:  Yes (Comment)  Care giving concerns:  Patient resides alone in her home.    Social Worker assessment / plan:  CSW spoke with patient and her son: Lydia Clark: [phone], who was visiting with patient in her hospital room. Patient and son explained that patient was in [place] for 3 weeks and that she had just discharged two weeks ago. Patient and son do not wish to consider rehab again and patient's son is interested in services other services in the home in addition to home health. Patient and son are aware that the RN CM will be coming by to speak with them soon about further services in the home. Patient and son are aware that CSW will assist if they change their mind regarding rehab.   Employment status:  Retired Forensic scientist:  Medicare PT Recommendations:  Not assessed at this time Information / Referral to community resources:     Patient/Family's Response to care:  Patient and son were very appreciative of CSW visit.  Patient/Family's Understanding of and Emotional Response to  Diagnosis, Current Treatment, and Prognosis:  Patient and son appeared in good spirits this afternoon.  Emotional Assessment Appearance:  Appears stated age Attitude/Demeanor/Rapport:   (pleasant and cooperative) Affect (typically observed):  Accepting, Calm, Happy, Appropriate Orientation:  Oriented to Self, Oriented to Place, Oriented to  Time, Oriented to Situation Alcohol / Substance use:  Not Applicable Psych involvement (Current and /or in the community):  No (Comment)  Discharge Needs  Concerns to be addressed:  Care Coordination Readmission within the last 30 days:  No Current discharge risk:  None Barriers to Discharge:  No Barriers Identified   Shela Leff, LCSW 02/24/2015, 2:00 PM

## 2015-02-24 NOTE — Progress Notes (Signed)
ANTIBIOTIC CONSULT NOTE - INITIAL  Pharmacy Consult for Levaquin Indication: rule out pneumonia  Allergies  Allergen Reactions  . Bee Venom Swelling and Other (See Comments)    Breathing problems  . Biaxin [Clarithromycin] Other (See Comments)    Throat swells  . Hydrocodone Nausea And Vomiting  . Mycostatin [Nystatin] Other (See Comments)    Blisters from the ointment  . Polysorbate     Other reaction(s): Unknown  . Sulfa Antibiotics Other (See Comments)    "welps"  . Penicillins Rash    Patient Measurements: Height: 5\' 7"  (170.2 cm) Weight: 125 lb 14.1 oz (57.1 kg) IBW/kg (Calculated) : 61.6  Vital Signs: Temp: 98.6 F (37 C) (12/21 1303) Temp Source: Oral (12/21 1303) BP: 164/79 mmHg (12/21 1200) Pulse Rate: 108 (12/21 1200) Intake/Output from previous day: 12/20 0701 - 12/21 0700 In: 123 [P.O.:120; I.V.:3] Out: 3000  Intake/Output from this shift: Total I/O In: 118 [P.O.:118] Out: -   Labs:  Recent Labs  02/23/15 0727 02/24/15 1134  WBC 15.1*  --   HGB 9.5*  --   PLT 452*  --   CREATININE 5.78* 3.99*   Estimated Creatinine Clearance: 11.5 mL/min (by C-G formula based on Cr of 3.99). No results for input(s): VANCOTROUGH, VANCOPEAK, VANCORANDOM, GENTTROUGH, GENTPEAK, GENTRANDOM, TOBRATROUGH, TOBRAPEAK, TOBRARND, AMIKACINPEAK, AMIKACINTROU, AMIKACIN in the last 72 hours.   Microbiology: Recent Results (from the past 720 hour(s))  Blood culture (routine x 2)     Status: None (Preliminary result)   Collection Time: 02/23/15  7:28 AM  Result Value Ref Range Status   Specimen Description BLOOD RIGHT AC  Final   Special Requests BOTTLES DRAWN AEROBIC AND ANAEROBIC 2ML  Final   Culture NO GROWTH 1 DAY  Final   Report Status PENDING  Incomplete  Blood culture (routine x 2)     Status: None (Preliminary result)   Collection Time: 02/23/15  7:34 AM  Result Value Ref Range Status   Specimen Description BLOOD RIGHT HAND  Final   Special Requests BOTTLES DRAWN  AEROBIC AND ANAEROBIC 7ML  Final   Culture NO GROWTH 1 DAY  Final   Report Status PENDING  Incomplete  MRSA PCR Screening     Status: None   Collection Time: 02/23/15 11:00 AM  Result Value Ref Range Status   MRSA by PCR NEGATIVE NEGATIVE Final    Comment:        The GeneXpert MRSA Assay (FDA approved for NASAL specimens only), is one component of a comprehensive MRSA colonization surveillance program. It is not intended to diagnose MRSA infection nor to guide or monitor treatment for MRSA infections.     Medical History: Past Medical History  Diagnosis Date  . Allergy   . Cancer [facility]) 2008    left breast  . Hypertension 2006  . Personal history of tobacco use, presenting hazards to health 2012    30 yrs smoking  . Endocrine disorder 2008    thyroid  . Personal history of malignant neoplasm of breast 2008  . Breast screening, unspecified 2013  . Special screening for malignant neoplasms, colon 2013  . COPD (chronic obstructive pulmonary disease) ([place])   . PE (pulmonary embolism)     patient denies  . Swelling of throat     swelling at base of throat,  . Anxiety     anxiety  . Pneumonia     hx  . Heart murmur     child  . History of kidney stones   .  GERD (gastroesophageal reflux disease)   . Arthritis   . Multinodular goiter     followed by Dr. Carloyn Manner @ [facility]  . Dialysis patient ([place]) 2014  . Chronic kidney disease 2014    stage IV chronic     Medications:  Scheduled:  . alteplase  2 mg Intracatheter Once  . antiseptic oral rinse  7 mL Mouth Rinse q12n4p  . aspirin EC  81 mg Oral Daily  . chlorhexidine  15 mL Mouth Rinse BID  . cholecalciferol  2,000 Units Oral Daily  . epoetin (EPOGEN/PROCRIT) injection  4,000 Units Subcutaneous Q T,Th,Sa-HD  . feeding supplement (ENSURE ENLIVE)  237 mL Oral BID BM  . furosemide  40 mg Intravenous Daily  . heparin  5,000 Units Subcutaneous Q12H  . insulin aspart  0-9 Units Subcutaneous TID WC  .  irbesartan  150 mg Oral BID  . [START ON 02/25/2015] levofloxacin (LEVAQUIN) IV  500 mg Intravenous Q48H  . LORazepam  1 mg Oral BID  . methylPREDNISolone (SOLU-MEDROL) injection  60 mg Intravenous Q24H  . mirtazapine  15 mg Oral QHS  . mometasone-formoterol  2 puff Inhalation BID  . pantoprazole  40 mg Oral Daily  . rosuvastatin  10 mg Oral QPM  . senna  1 tablet Oral BID  . sodium chloride  3 mL Intravenous Q12H  . tiotropium  18 mcg Inhalation QHS   Infusions:    Assessment: 74 y/o F with a h/o ESRD on HD admitted with respiratory failure, possible PNA.   Plan:  Continue Levaquin 500 mg iv q 48 hours. Will continue to follow culture results.    Ulice Dash D 02/24/2015,1:11 PM

## 2015-02-24 NOTE — Progress Notes (Signed)
HD tx start 

## 2015-02-24 NOTE — Progress Notes (Addendum)
Pt still complaining of cough.  MD. Dr. Jannifer Franklin notified.  MD to place order for throat lozenge and tessalon pearles.  MD notified to elevated HR and BP.  MD advised to recheck once pt stops coughing/moving around, no need to give hydralazine at this point.  Will continue to monitor. Jessee Avers

## 2015-02-24 NOTE — Progress Notes (Signed)
Back from HD no distress noted pt denies discomfort or pain.

## 2015-02-24 NOTE — Progress Notes (Signed)
Initial Nutrition Assessment   INTERVENTION:   Meals and Snacks: Cater to patient preferences, may benefit from snacks between meals Medical Food Supplement Therapy: continue Ensure Enlive po BID as ordered by MD, each supplement provides 350 kcal and 20 grams of protein   NUTRITION DIAGNOSIS:   Inadequate oral intake related to acute illness as evidenced by meal completion < 25%.  GOAL:   Patient will meet greater than or equal to 90% of their needs   MONITOR:    (Energy Intake, Anthropometrics, Electrolyte/Renal Profile, Digestive System)  REASON FOR ASSESSMENT:    (Renal Diet, ESRD)    ASSESSMENT:    Pt admitted with acute on chronic respiratory failure with pulmonary edema/volume overload requiring emergent HD yesterday, 3L removed. Noted pt had been receiving PD until pt had to go to rehab after recent hip fracture. Pt down in dialysis on visit today  Past Medical History  Diagnosis Date  . Allergy   . Cancer [facility]) 2008    left breast  . Hypertension 2006  . Personal history of tobacco use, presenting hazards to health 2012    30 yrs smoking  . Endocrine disorder 2008    thyroid  . Personal history of malignant neoplasm of breast 2008  . Breast screening, unspecified 2013  . Special screening for malignant neoplasms, colon 2013  . COPD (chronic obstructive pulmonary disease) ([place])   . PE (pulmonary embolism)     patient denies  . Swelling of throat     swelling at base of throat,  . Anxiety     anxiety  . Pneumonia     hx  . Heart murmur     child  . History of kidney stones   . GERD (gastroesophageal reflux disease)   . Arthritis   . Multinodular goiter     followed by Dr. Carloyn Manner @ [facility]  . Dialysis patient ([place]) 2014  . Chronic kidney disease 2014    stage IV chronic     Diet Order:  Diet renal with fluid restriction Fluid restriction:: 1200 mL Fluid; Room service appropriate?: Yes; Fluid consistency:: Thin   Energy Intake:  recorded po intake 20% today  Food and Nutrition Related history: pt with Ensure BID on home med list; pt taking Ensure on previous admissions. Previously reporting eating 3 meals per day  Electrolyte and Renal Profile:  Recent Labs Lab 02/23/15 0727 02/23/15 1319 02/24/15 1134  BUN 40*  --  31*  CREATININE 5.78*  --  3.99*  NA 135  --  135  K 4.4  --  4.4  MG  --  1.7  --    Glucose Profile:   Recent Labs  02/23/15 2055 02/24/15 0710 02/24/15 1145  GLUCAP 169* 81 117*   Meds: lasix, ss novolog, solumedrol, remeron  Height:   Ht Readings from Last 1 Encounters:  02/23/15 5\' 7"  (1.702 m)    Weight: wt as per wt encounters  Wt Readings from Last 1 Encounters:  02/24/15 131 lb 13.4 oz (59.8 kg)    Filed Weights   02/23/15 1400 02/23/15 1815 02/24/15 1415  Weight: 132 lb 7.9 oz (60.1 kg) 125 lb 14.1 oz (57.1 kg) 131 lb 13.4 oz (59.8 kg)   Wt Readings from Last 10 Encounters:  02/23/15 125 lb 14.1 oz (57.1 kg)  01/14/15 132 lb 9.6 oz (60.147 kg)  07/22/14 131 lb (59.421 kg)  06/16/14 130 lb (58.968 kg)  05/11/14 128 lb (58.06 kg)  07/29/13 133 lb (  60.328 kg)  10/15/12 124 lb (56.246 kg)  07/23/12 129 lb (58.514 kg)  07/01/12 128 lb 8.4 oz (58.299 kg)  09/28/11 130 lb (58.968 kg)    BMI:  Body mass index is 20.64 kg/(m^2).  Estimated Nutritional Needs:   Kcal:  [id number] kcals (BEE 1113, 1.3 AF, 1.1-1.3 IF)   Protein:  68-86 g (1.2-1.5 g/kg)   Fluid:  1000 mL plus [place] MS, RD, LDN [phone] Pager  [phone] Weekend/On-Call Pager

## 2015-02-24 NOTE — Progress Notes (Signed)
Subjective:    HEMODIALYSIS FLOWSHEET:12/20  Blood Flow Rate (mL/min): 350 mL/min Arterial Pressure (mmHg): -150 mmHg Venous Pressure (mmHg): 110 mmHg Transmembrane Pressure (mmHg): 70 mmHg Ultrafiltration Rate (mL/min): 1000 mL/min Dialysate Flow Rate (mL/min): 800 ml/min Conductivity: Machine : 14.1 Conductivity: Machine : 14.1 Dialysis Fluid Bolus: Normal Saline Bolus Amount (mL): 250 mL Intra-Hemodialysis Comments: 3549ml.Marland KitchenMarland KitchenTx completed  3 L of fluid removed yesterday with dialysis She is off BiPAP however, she is still coughing and feels like her breathing is not normal  Objective:  Vital signs in last 24 hours:  Temp:  [98 F (36.7 C)-99.5 F (37.5 C)] 98 F (36.7 C) (12/21 0800) Pulse Rate:  [84-110] 110 (12/21 0900) Resp:  [12-29] 29 (12/21 0900) BP: (149-187)/(74-111) 187/83 mmHg (12/21 0900) SpO2:  [93 %-100 %] 93 % (12/21 0900) FiO2 (%):  [40 %] 40 % (12/20 1200) Weight:  [57.1 kg (125 lb 14.1 oz)-60.1 kg (132 lb 7.9 oz)] 57.1 kg (125 lb 14.1 oz) (12/20 1815)  Weight change:  Filed Weights   02/23/15 1100 02/23/15 1400 02/23/15 1815  Weight: 60.1 kg (132 lb 7.9 oz) 60.1 kg (132 lb 7.9 oz) 57.1 kg (125 lb 14.1 oz)    Intake/Output:    Intake/Output Summary (Last 24 hours) at 02/24/15 1136 Last data filed at 02/24/15 1100  Gross per 24 hour  Intake    241 ml  Output   3000 ml  Net  -2759 ml     Physical Exam: General:  critically ill-appearing   HEENT  oxygen by nasal cannula  Neck  supple   Pulm/lungs  diffuse bilateral crackles,   CVS/Heart  irregular rhythm, no rub or gallop   Abdomen:   soft, nontender, nondistended   Extremities:  2-3+ pitting edema   Neurologic:  able to answer a few questions   Skin:  no acute rashes    Access: right IJ PermCath, PD catheter in place       Basic Metabolic Panel:   Recent Labs Lab 02/23/15 0727 02/23/15 1319  NA 135  --   K 4.4  --   CL 97*  --   CO2 26  --   GLUCOSE 188*  --   BUN 40*  --    CREATININE 5.78*  --   CALCIUM 8.1*  --   MG  --  1.7     CBC:  Recent Labs Lab 02/23/15 0727  WBC 15.1*  NEUTROABS 9.2*  HGB 9.5*  HCT 30.3*  MCV 101.4*  PLT 452*      Microbiology:  Recent Results (from the past 720 hour(s))  Blood culture (routine x 2)     Status: None (Preliminary result)   Collection Time: 02/23/15  7:28 AM  Result Value Ref Range Status   Specimen Description BLOOD RIGHT AC  Final   Special Requests BOTTLES DRAWN AEROBIC AND ANAEROBIC 2ML  Final   Culture NO GROWTH < 12 HOURS  Final   Report Status PENDING  Incomplete  Blood culture (routine x 2)     Status: None (Preliminary result)   Collection Time: 02/23/15  7:34 AM  Result Value Ref Range Status   Specimen Description BLOOD RIGHT HAND  Final   Special Requests BOTTLES DRAWN AEROBIC AND ANAEROBIC 7ML  Final   Culture NO GROWTH < 12 HOURS  Final   Report Status PENDING  Incomplete  MRSA PCR Screening     Status: None   Collection Time: 02/23/15 11:00 AM  Result Value Ref Range  Status   MRSA by PCR NEGATIVE NEGATIVE Final    Comment:        The GeneXpert MRSA Assay (FDA approved for NASAL specimens only), is one component of a comprehensive MRSA colonization surveillance program. It is not intended to diagnose MRSA infection nor to guide or monitor treatment for MRSA infections.     Coagulation Studies: No results for input(s): LABPROT, INR in the last 72 hours.  Urinalysis: No results for input(s): COLORURINE, LABSPEC, PHURINE, GLUCOSEU, HGBUR, BILIRUBINUR, KETONESUR, PROTEINUR, UROBILINOGEN, NITRITE, LEUKOCYTESUR in the last 72 hours.  Invalid input(s): APPERANCEUR    Imaging: Dg Chest Port 1 View  02/23/2015  CLINICAL DATA:  Patient with shortness of breath. History of COPD. Renal failure. EXAM: PORTABLE CHEST 1 VIEW COMPARISON:  Chest radiograph 01/09/2015. FINDINGS: Central venous catheter tip projects over the superior vena cava. Monitoring leads overlie the patient.  Stable cardiac and mediastinal contours. Interval development of bilateral predominately mid and lower lung interstitial pulmonary opacities. Small bilateral pleural effusions. IMPRESSION: Interval development bilateral mid lower lung interstitial pulmonary opacities most compatible with pulmonary edema with associated small effusions. Electronically Signed   By: Lovey Newcomer M.D.   On: 02/23/2015 07:46     Medications:     . antiseptic oral rinse  7 mL Mouth Rinse q12n4p  . aspirin EC  81 mg Oral Daily  . chlorhexidine  15 mL Mouth Rinse BID  . cholecalciferol  2,000 Units Oral Daily  . epoetin (EPOGEN/PROCRIT) injection  4,000 Units Subcutaneous Q T,Th,Sa-HD  . feeding supplement (ENSURE ENLIVE)  237 mL Oral BID BM  . furosemide  40 mg Intravenous Daily  . heparin  5,000 Units Subcutaneous Q12H  . insulin aspart  0-9 Units Subcutaneous TID WC  . irbesartan  150 mg Oral BID  . [START ON 02/25/2015] levofloxacin (LEVAQUIN) IV  500 mg Intravenous Q48H  . LORazepam  1 mg Oral BID  . methylPREDNISolone (SOLU-MEDROL) injection  60 mg Intravenous Q24H  . mirtazapine  15 mg Oral QHS  . mometasone-formoterol  2 puff Inhalation BID  . pantoprazole  40 mg Oral Daily  . rosuvastatin  10 mg Oral QPM  . senna  1 tablet Oral BID  . sodium chloride  3 mL Intravenous Q12H  . tiotropium  18 mcg Inhalation QHS     Assessment/ Plan:  74 y.o. Caucasian female with end-stage renal disease on peritoneal dialysis, hypertension, COPD, GERD, hyperlipidemia, generalized anxiety disorder, history depression, anemia of chronic kidney disease, secondary hyperparathyroidism and osteopenia. Chronic systolic congestive heart failure. EF 30-35%. December 2016 echo   1. End Stage Renal Disease: currently on hemodialysis.  extra treatment today for volume removal. 3000 cc removed yesterday PD catheter: Nurse reports that fluid goes and but does not drain well. We will try TPA. If unsuccessful, will consult vascular  surgery for evaluation    2. Anemia of chronic kidney disease:  - We will continue Procrit with dialysis  - Hemoglobin 9.5   3. Secondary Hyperparathyroidism:  - monitor phosphorus during the hospitalization   4. Peripheral edema - Volume removal with dialysis  as tolerated      LOS: 1 Rubina Basinski 12/21/201611:36 AM

## 2015-02-24 NOTE — Progress Notes (Signed)
HD tx completed.

## 2015-02-24 NOTE — Progress Notes (Signed)
PT Cancellation Note  Patient Details Name: Lydia Clark MRN: [id number] DOB: [birth year]   Cancelled Treatment:    Reason Eval/Treat Not Completed: Medical issues which prohibited therapy (Chart reviewed for attempted evaluation.  Per discussion with primary RN, patient recently with anxiety attack; recommends hold on activity at this time.  Will re-attempt at later time/date as medically appropriate.)   Rane Blitch H. Owens Shark, PT, DPT, NCS 02/24/2015, 10:03 AM [phone]

## 2015-02-24 NOTE — Progress Notes (Signed)
Pre-hd tx 

## 2015-02-24 NOTE — Care Management (Addendum)
patient with recent discharge home from [facility] after stay for hip fracture.  She is currently being followed by [facility].  Agency has been notified of admission.  She was admitted to icu due to need for bipapp for respiratory distress.  She has transferred out to 2A on nasal cannula.  CM attempted to speak with patient ind inquire about home 02.  Patient was off the unit receiving dialysis.  Dialysis coordinator is aware of admission.  Receives dialysis at [place]

## 2015-02-24 NOTE — Progress Notes (Signed)
PT Cancellation Note  Patient Details Name: VAYLA DOMEIER MRN: [id number] DOB: 19-Jan-1941   Cancelled Treatment:    Reason Eval/Treat Not Completed: Patient at procedure or test/unavailable (Evaluation re-attempted this PM; patient currently in dialysis and unavailable for treatment.  Will re-attempt next date as medically appropriate.)   Carrell Palmatier H. Owens Shark, PT, DPT, NCS 02/24/2015, 3:00 PM [phone]

## 2015-02-24 NOTE — Progress Notes (Signed)
Pt AAOx4 pt has cont. Non productive cough. Pt has no c/o pain or distress . Pt is on [place] 1lL. sats 96%. No SOB noted this shift.

## 2015-02-24 NOTE — Progress Notes (Signed)
Post hd tx 

## 2015-02-24 NOTE — Progress Notes (Addendum)
[place] [facility] [place] NAME: Lydia Clark    MR#:  [id number]  DATE OF BIRTH:  Feb 06, 1941  SUBJECTIVE:  CHIEF COMPLAINT:   Chief Complaint  Patient presents with  . Respiratory Distress   shortness of breath and cough is better, off BiPAP and on oxygen and it cannular this morning.  REVIEW OF SYSTEMS:  CONSTITUTIONAL: No fever, but has generalized weakness.  EYES: No blurred or double vision.  EARS, NOSE, AND THROAT: No tinnitus or ear pain.  RESPIRATORY: Has cough, shortness of breath, no wheezing or hemoptysis.  CARDIOVASCULAR: No chest pain, orthopnea, edema.  GASTROINTESTINAL: No nausea, vomiting, diarrhea or abdominal pain.  GENITOURINARY: No dysuria, hematuria.  ENDOCRINE: No polyuria, nocturia,  HEMATOLOGY: No anemia, easy bruising or bleeding SKIN: No rash or lesion. MUSCULOSKELETAL: No joint pain or arthritis.   NEUROLOGIC: No tingling, numbness, weakness.  PSYCHIATRY: No anxiety or depression.   DRUG ALLERGIES:   Allergies  Allergen Reactions  . Bee Venom Swelling and Other (See Comments)    Breathing problems  . Biaxin [Clarithromycin] Other (See Comments)    Throat swells  . Hydrocodone Nausea And Vomiting  . Mycostatin [Nystatin] Other (See Comments)    Blisters from the ointment  . Polysorbate     Other reaction(s): Unknown  . Sulfa Antibiotics Other (See Comments)    "welps"  . Penicillins Rash    VITALS:  Blood pressure 173/87, pulse 99, temperature 98.2 F (36.8 C), temperature source Oral, resp. rate 24, height 5\' 7"  (1.702 m), weight 59.8 kg (131 lb 13.4 oz), SpO2 98 %.  PHYSICAL EXAMINATION:  GENERAL:  74 y.o.-year-old patient lying in the bed with no acute distress.  EYES: Pupils equal, round, reactive to light and accommodation. No scleral icterus. Extraocular muscles intact.  HEENT: Head atraumatic, normocephalic. Oropharynx and nasopharynx clear.  NECK:  Supple, no jugular venous distention.  No thyroid enlargement, no tenderness.  LUNGS: Normal breath sounds bilaterally, no wheezing, rales,rhonchi or crepitation. No use of accessory muscles of respiration.  CARDIOVASCULAR: S1, S2 normal. No murmurs, rubs, or gallops.  ABDOMEN: Soft, nontender, nondistended. Bowel sounds present. No organomegaly or mass.  EXTREMITIES: No pedal edema, cyanosis, or clubbing.  NEUROLOGIC: Cranial nerves II through XII are intact. Muscle strength 5/5 in all extremities. Sensation intact. Gait not checked.  PSYCHIATRIC: The patient is alert and oriented x 3.  SKIN: No obvious rash, lesion, or ulcer.    LABORATORY PANEL:   CBC  Recent Labs Lab 02/24/15 1448  WBC 11.4*  HGB 9.3*  HCT 30.0*  PLT 419   ------------------------------------------------------------------------------------------------------------------  Chemistries   Recent Labs Lab 02/23/15 1319 02/24/15 1134  NA  --  135  K  --  4.4  CL  --  97*  CO2  --  26  GLUCOSE  --  125*  BUN  --  31*  CREATININE  --  3.99*  CALCIUM  --  8.2*  MG 1.7  --    ------------------------------------------------------------------------------------------------------------------  Cardiac Enzymes  Recent Labs Lab 02/23/15 2334  TROPONINI 0.33*   ------------------------------------------------------------------------------------------------------------------  RADIOLOGY:  Dg Chest Port 1 View  02/23/2015  CLINICAL DATA:  Patient with shortness of breath. History of COPD. Renal failure. EXAM: PORTABLE CHEST 1 VIEW COMPARISON:  Chest radiograph 01/09/2015. FINDINGS: Central venous catheter tip projects over the superior vena cava. Monitoring leads overlie the patient. Stable cardiac and mediastinal contours. Interval development of bilateral predominately mid and lower lung interstitial pulmonary opacities. Small  bilateral pleural effusions. IMPRESSION: Interval development bilateral mid lower lung interstitial pulmonary opacities most  compatible with pulmonary edema with associated small effusions. Electronically Signed   By: Lovey Newcomer M.D.   On: 02/23/2015 07:46    EKG:   Orders placed or performed during the hospital encounter of 02/23/15  . ED EKG  . ED EKG  . EKG 12-Lead  . EKG 12-Lead    ASSESSMENT AND PLAN:   1. Acute on chronic hypoxic respiratory failure secondary to pulmonary edema with volume overload. She was on BiPAP last night, off BiPAP this morning. On oxygen by nasal cannular this morning. No evidence of infection or COPD. Discontinue IV Levaquin and IV Solu-Medrol daily.  2. Acute systolic CHF with ejection fraction 30-35%,  Acute pulmonary edema as noted on chest x-ray and clinical presentation. Patient has no history of CHF according to old records.  Continue IV Lasix 40 daily and hemodialysis. Continue Diovan. Cardiology consult.  *Elevated troponin, possible due to demanding ischemia, follow-up cardiologist.  3. End-stage renal disease on Hemodialysis Patient got urgent dialysis yesterday and will get extra hemodialysis today.   4. Hypertension. continue Diovan  5. Protein calorie malnutrition. Follow-up dietitian's recommendation.  6. Hyperlipidemia on Crestor    All the records are reviewed and case discussed with Care Management/Social Workerr. Management plans discussed with the patient, her son and they are in agreement.  CODE STATUS: Full code  TOTAL TIME TAKING CARE OF THIS PATIENT: 43 minutes.  Greater than 50% time was spent on coordination of care and face-to-face counseling.  POSSIBLE D/C IN 2 DAYS, DEPENDING ON CLINICAL CONDITION.   Demetrios Loll M.D on 02/24/2015 at 4:17 PM  Between 7am to 6pm - Pager - [phone]  After 6pm go to [email or web address] - password [email or web address] [facility]  Office  [phone]  CC: Primary care physician; Pcp Not In System

## 2015-02-25 LAB — GLUCOSE, CAPILLARY
GLUCOSE-CAPILLARY: 136 mg/dL — AB (ref 65–99)
GLUCOSE-CAPILLARY: 119 mg/dL — AB (ref 65–99)
Glucose-Capillary: 85 mg/dL (ref 65–99)
Glucose-Capillary: 111 mg/dL — ABNORMAL HIGH (ref 65–99)

## 2015-02-25 MED ORDER — DELFLEX-LC/2.5% DEXTROSE 394 MOSM/L IP SOLN
INTRAPERITONEAL | Status: DC
  Administered 2015-02-25: 13:00:00 via INTRAPERITONEAL
  Filled 2015-02-25 (×2): qty 3000

## 2015-02-25 MED ORDER — FUROSEMIDE 40 MG PO TABS
40.0000 mg | ORAL_TABLET | Freq: Every day | ORAL | Status: DC
  Administered 2015-02-26: 40 mg via ORAL
  Filled 2015-02-25: qty 1

## 2015-02-25 MED ORDER — GENTAMICIN SULFATE 0.1 % EX CREA
1.0000 "application " | TOPICAL_CREAM | Freq: Every day | CUTANEOUS | Status: DC
  Administered 2015-02-26: 1 via TOPICAL
  Filled 2015-02-25: qty 15

## 2015-02-25 MED ORDER — HEPARIN 1000 UNIT/ML FOR PERITONEAL DIALYSIS
1500.0000 [IU] | INTRAMUSCULAR | Status: DC | PRN
  Filled 2015-02-25: qty 1.5

## 2015-02-25 NOTE — Progress Notes (Signed)
[facility] NAME: Lydia Clark    MR#:  [id number]  DATE OF BIRTH:  [birth year]  SUBJECTIVE:  CHIEF COMPLAINT:   Chief Complaint  Patient presents with  . Respiratory Distress   No shortness of breath but has cough ioff BiPAP and oxygen and it cannular.  REVIEW OF SYSTEMS:  CONSTITUTIONAL: No fever, but has generalized weakness.  EYES: No blurred or double vision.  EARS, NOSE, AND THROAT: No tinnitus or ear pain.  RESPIRATORY: Has cough, no shortness of breath, no wheezing or hemoptysis.  CARDIOVASCULAR: No chest pain, orthopnea, edema.  GASTROINTESTINAL: No nausea, vomiting, diarrhea or abdominal pain.  GENITOURINARY: No dysuria, hematuria.  ENDOCRINE: No polyuria, nocturia,  HEMATOLOGY: No anemia, easy bruising or bleeding SKIN: No rash or lesion. MUSCULOSKELETAL: No joint pain or arthritis.   NEUROLOGIC: No tingling, numbness, weakness.  PSYCHIATRY: No anxiety or depression.   DRUG ALLERGIES:   Allergies  Allergen Reactions  . Bee Venom Swelling and Other (See Comments)    Breathing problems  . Biaxin [Clarithromycin] Other (See Comments)    Throat swells  . Hydrocodone Nausea And Vomiting  . Mycostatin [Nystatin] Other (See Comments)    Blisters from the ointment  . Polysorbate     Other reaction(s): Unknown  . Sulfa Antibiotics Other (See Comments)    "welps"  . Penicillins Rash    VITALS:  Blood pressure 158/68, pulse 95, temperature 98 F (36.7 C), temperature source Oral, resp. rate 20, height 5\' 7"  (1.702 m), weight 56.6 kg (124 lb 12.5 oz), SpO2 94 %.  PHYSICAL EXAMINATION:  GENERAL:  74 y.o.-year-old patient lying in the bed with no acute distress.  EYES: Pupils equal, round, reactive to light and accommodation. No scleral icterus. Extraocular muscles intact.  HEENT: Head atraumatic, normocephalic. Oropharynx and nasopharynx clear.  NECK:  Supple, no jugular venous distention. No thyroid  enlargement, no tenderness.  LUNGS: Normal breath sounds bilaterally, no wheezing, rales,rhonchi or crepitation. No use of accessory muscles of respiration.  CARDIOVASCULAR: S1, S2 normal. No murmurs, rubs, or gallops.  ABDOMEN: Soft, nontender, nondistended. Bowel sounds present. No organomegaly or mass.  EXTREMITIES: No pedal edema, cyanosis, or clubbing.  NEUROLOGIC: Cranial nerves II through XII are intact. Muscle strength 4/5 in all extremities. Sensation intact. Gait not checked.  PSYCHIATRIC: The patient is alert and oriented x 3.  SKIN: No obvious rash, lesion, or ulcer.    LABORATORY PANEL:   CBC  Recent Labs Lab 02/24/15 1448  WBC 11.4*  HGB 9.3*  HCT 30.0*  PLT 419   ------------------------------------------------------------------------------------------------------------------  Chemistries   Recent Labs Lab 02/23/15 1319 02/24/15 1134  NA  --  135  K  --  4.4  CL  --  97*  CO2  --  26  GLUCOSE  --  125*  BUN  --  31*  CREATININE  --  3.99*  CALCIUM  --  8.2*  MG 1.7  --    ------------------------------------------------------------------------------------------------------------------  Cardiac Enzymes  Recent Labs Lab 02/23/15 2334  TROPONINI 0.33*   ------------------------------------------------------------------------------------------------------------------  RADIOLOGY:  No results found.  EKG:   Orders placed or performed during the hospital encounter of 02/23/15  . ED EKG  . ED EKG  . EKG 12-Lead  . EKG 12-Lead    ASSESSMENT AND PLAN:   1. Acute on chronic hypoxic respiratory failure secondary to pulmonary edema with volume overload. She was on BiPAP, off BiPAP. On oxygen by nasal cannula. No  evidence of infection or COPD. Discontinued IV Levaquin and IV Solu-Medrol daily.  2. Acute systolic CHF with ejection fraction 30-35%,  Acute pulmonary edema as noted on chest x-ray and clinical presentation. Patient has no history of  CHF according to old records.  Change to po Lasix 40 daily and hemodialysis. Continue Diovan. Cardiology consult.  *Elevated troponin, possible due to demanding ischemia, follow-up cardiologist.  3. End-stage renal disease on Hemodialysis Patient got urgent dialysis and extra hemodialysis yesterday. Per Dr. Candiss Norse, need HD and  fix PD cath today.  4. Hypertension. continue avapro and lasix.  5. Protein calorie malnutrition. Follow-up dietitian's recommendation.   6. Hyperlipidemia on Crestor    All the records are reviewed and case discussed with Care Management/Social Workerr. Management plans discussed with the patient, her son and they are in agreement.  CODE STATUS: Full code  TOTAL TIME TAKING CARE OF THIS PATIENT: 37 minutes.  Greater than 50% time was spent on coordination of care and face-to-face counseling.  POSSIBLE D/C IN 1-2 DAYS, DEPENDING ON CLINICAL CONDITION.   Demetrios Loll M.D on 02/25/2015 at 3:47 PM  Between 7am to 6pm - Pager - [phone]  After 6pm go to [email or web address] - password [email or web address] [facility]  Office  [phone]  CC: Primary care physician; Pcp Not In System

## 2015-02-25 NOTE — Progress Notes (Signed)
Subjective:  5500 cc of fluid has been removed with dialysis last 2 days No lower extremity edema Still continues to have coughing PD catheter was packed with TPA yesterday   Objective:  Vital signs in last 24 hours:  Temp:  [98 F (36.7 C)-99.4 F (37.4 C)] 98 F (36.7 C) (12/22 1146) Pulse Rate:  [87-123] 95 (12/22 1146) Resp:  [16-20] 20 (12/22 1146) BP: (151-186)/(68-102) 158/68 mmHg (12/22 1146) SpO2:  [90 %-97 %] 94 % (12/22 1146) Weight:  [56.6 kg (124 lb 12.5 oz)] 56.6 kg (124 lb 12.5 oz) (12/21 1744)  Weight change: -0.075 kg (-2.6 oz) Filed Weights   02/23/15 1815 02/24/15 1415 02/24/15 1744  Weight: 57.1 kg (125 lb 14.1 oz) 59.8 kg (131 lb 13.4 oz) 56.6 kg (124 lb 12.5 oz)    Intake/Output:    Intake/Output Summary (Last 24 hours) at 02/25/15 1732 Last data filed at 02/25/15 1130  Gross per 24 hour  Intake    360 ml  Output   2500 ml  Net  -2140 ml     Physical Exam: General:  critically ill-appearing   HEENT  oxygen by nasal cannula  Neck  supple   Pulm/lungs  diffuse bilateral crackles,   CVS/Heart  irregular rhythm, no rub or gallop   Abdomen:   soft, nontender, nondistended   Extremities:  2-3+ pitting edema   Neurologic:  able to answer a few questions   Skin:  no acute rashes    Access: right IJ PermCath, PD catheter in place       Basic Metabolic Panel:   Recent Labs Lab 02/23/15 0727 02/23/15 1319 02/24/15 1134  NA 135  --  135  K 4.4  --  4.4  CL 97*  --  97*  CO2 26  --  26  GLUCOSE 188*  --  125*  BUN 40*  --  31*  CREATININE 5.78*  --  3.99*  CALCIUM 8.1*  --  8.2*  MG  --  1.7  --      CBC:  Recent Labs Lab 02/23/15 0727 02/24/15 1448  WBC 15.1* 11.4*  NEUTROABS 9.2* 9.8*  HGB 9.5* 9.3*  HCT 30.3* 30.0*  MCV 101.4* 99.5  PLT 452* 419      Microbiology:  Recent Results (from the past 720 hour(s))  Blood culture (routine x 2)     Status: None (Preliminary result)   Collection Time: 02/23/15  7:28 AM   Result Value Ref Range Status   Specimen Description BLOOD RIGHT AC  Final   Special Requests BOTTLES DRAWN AEROBIC AND ANAEROBIC 2ML  Final   Culture NO GROWTH 1 DAY  Final   Report Status PENDING  Incomplete  Blood culture (routine x 2)     Status: None (Preliminary result)   Collection Time: 02/23/15  7:34 AM  Result Value Ref Range Status   Specimen Description BLOOD RIGHT HAND  Final   Special Requests BOTTLES DRAWN AEROBIC AND ANAEROBIC 7ML  Final   Culture NO GROWTH 1 DAY  Final   Report Status PENDING  Incomplete  MRSA PCR Screening     Status: None   Collection Time: 02/23/15 11:00 AM  Result Value Ref Range Status   MRSA by PCR NEGATIVE NEGATIVE Final    Comment:        The GeneXpert MRSA Assay (FDA approved for NASAL specimens only), is one component of a comprehensive MRSA colonization surveillance program. It is not intended to diagnose MRSA infection  nor to guide or monitor treatment for MRSA infections.     Coagulation Studies: No results for input(s): LABPROT, INR in the last 72 hours.  Urinalysis: No results for input(s): COLORURINE, LABSPEC, PHURINE, GLUCOSEU, HGBUR, BILIRUBINUR, KETONESUR, PROTEINUR, UROBILINOGEN, NITRITE, LEUKOCYTESUR in the last 72 hours.  Invalid input(s): APPERANCEUR    Imaging: No results found.   Medications:     . antiseptic oral rinse  7 mL Mouth Rinse q12n4p  . aspirin EC  81 mg Oral Daily  . chlorhexidine  15 mL Mouth Rinse BID  . cholecalciferol  2,000 Units Oral Daily  . dialysis solution 2.5% low-MG/low-CA   Intraperitoneal Q24H  . epoetin (EPOGEN/PROCRIT) injection  4,000 Units Subcutaneous Q T,Th,Sa-HD  . feeding supplement (ENSURE ENLIVE)  237 mL Oral BID BM  . furosemide  40 mg Oral Daily  . gentamicin cream  1 application Topical Daily  . heparin  5,000 Units Subcutaneous Q12H  . insulin aspart  0-9 Units Subcutaneous TID WC  . irbesartan  150 mg Oral BID  . LORazepam  1 mg Oral BID  . mirtazapine  15 mg  Oral QHS  . mometasone-formoterol  2 puff Inhalation BID  . pantoprazole  40 mg Oral Daily  . rosuvastatin  10 mg Oral QPM  . senna  1 tablet Oral BID  . sodium chloride  3 mL Intravenous Q12H  . tiotropium  18 mcg Inhalation QHS     Assessment/ Plan:  74 y.o. Caucasian female with end-stage renal disease on peritoneal dialysis, hypertension, COPD, GERD, hyperlipidemia, generalized anxiety disorder, history depression, anemia of chronic kidney disease, secondary hyperparathyroidism and osteopenia. Chronic systolic congestive heart failure. EF 30-35%. December 2016 echo   1. End Stage Renal Disease:  -  PD catheter is working after TPA developed - Trial of CCPD tonight.  If successful, patient will be able to go home on CCPD   2. Anemia of chronic kidney disease:  - We will continue Procrit with dialysis  - Hemoglobin 9.3  3. Secondary Hyperparathyroidism:  - monitor phosphorus during the hospitalization   4. Peripheral edema - Volume removal with dialysis  as tolerated - 5500 cc removed so far      LOS: 2 Azeem Poorman 12/22/20165:32 PM

## 2015-02-25 NOTE — Care Management Important Message (Signed)
Important Message  Patient Details  Name: Lydia Clark MRN: [id number] Date of Birth: 06/09/1940   Medicare Important Message Given:  Yes    Juliann Pulse A Earlie Arciga 02/25/2015, 11:49 AM

## 2015-02-25 NOTE — Evaluation (Signed)
Physical Therapy Evaluation Patient Details Name: Lydia Clark MRN: [id number] DOB: 05-15-1940 Today's Date: 02/25/2015   History of Present Illness  presented to ER secondary to cough, SOB; admitted for acute respiratory failure secondary to pulmonary edema/volume overload.  Of note, patient with recent R hip hemiarthroplasty (status post fall with hip fracture) Nov 2016.  Clinical Impression  Upon evaluation, patient alert and oriented to all information; pleasant and cooperative with all evaluation components.  Bilat UE/LE strength grossly symmetrical and WFL for basic transfers and mobility; patient with good recall of THPs for R hip.  Currently able to complete bed mobility with min assist; sit/stand, basic transfers and short-distance gait (5') with RW, cga/min assist.  No overt buckling or LOB, but mod fatigue/SOB with minimal activity noted.  Vitals stable and WFL on 2L (anticipate likely need for supplemental O2 upon discharge).  Will continue to progress gait distance as appropriate. Would benefit from skilled PT to address above deficits and promote optimal return to PLOF; recommend transition to STR upon discharge from acute hospitalization (to address deficits in cardiopulmonary endurance and overall activity tolerance), but patient/family strongly prefer discharge home with assist from son as needed.      Follow Up Recommendations Home health PT;Supervision/Assistance - 24 hour (therapist initially recommending STR; however patient/family strongly prefer discharge home with son)    Equipment Recommendations       Recommendations for Other Services       Precautions / Restrictions Precautions Precautions: Fall;Posterior Hip Restrictions Weight Bearing Restrictions: Yes RLE Weight Bearing: Weight bearing as tolerated      Mobility  Bed Mobility Overal bed mobility: Needs Assistance Bed Mobility: Supine to Sit     Supine to sit: Min assist         Transfers Overall transfer level: Needs assistance Equipment used: Rolling walker (2 wheeled) Transfers: Sit to/from Stand Sit to Stand: Min guard;Min assist            Ambulation/Gait Ambulation/Gait assistance: Min assist Ambulation Distance (Feet): 5 Feet Assistive device: Rolling walker (2 wheeled)       General Gait Details: step to gait pattern for mod WBing bilat UEs, forward flexed posture; no overt buckling or LOB, but moderate SOB with minimal activity (sats/vitals WFL)  Stairs            Wheelchair Mobility    Modified Rankin (Stroke Patients Only)       Balance Overall balance assessment: Needs assistance Sitting-balance support: No upper extremity supported;Feet supported Sitting balance-Leahy Scale: Fair Sitting balance - Comments: posterior trunk lean with fatigue   Standing balance support: Bilateral upper extremity supported Standing balance-Leahy Scale: Fair                               Pertinent Vitals/Pain Pain Assessment: No/denies pain    Home Living Family/patient expects to be discharged to:: Private residence Living Arrangements: Alone Available Help at Discharge: Family Type of Home: House Home Access: Stairs to enter Entrance Stairs-Rails: Left Entrance Stairs-Number of Steps: 3 Home Layout: Two level;Able to live on main level with bedroom/bathroom Home Equipment: Gilford Rile - 2 wheels;Cane - single point;Shower seat      Prior Function           Comments: Prior to hip fracture, was indep with mobility and ADLs.  Completed course of STR post-R hip hemiarthroplasty and had been home (with assist as needed from son) approx 10 days  prior to admission; actively receiving HHPT, RN and aide.     Hand Dominance        Extremity/Trunk Assessment   Upper Extremity Assessment: Overall WFL for tasks assessed           Lower Extremity Assessment: Overall WFL for tasks assessed (grossly 4/5 throughout within  THPs)         Communication   Communication: No difficulties  Cognition Arousal/Alertness: Awake/alert Behavior During Therapy: WFL for tasks assessed/performed Overall Cognitive Status: Within Functional Limits for tasks assessed                      General Comments      Exercises Other Exercises Other Exercises: Toilet transfer, SPT with RW, cga/min assist; sit/stand from [facility] with RW, cga/min assist; standing balance with RW, cga; dep for hygiene      Assessment/Plan    PT Assessment Patient needs continued PT services  PT Diagnosis Difficulty walking;Generalized weakness   PT Problem List Decreased strength;Decreased range of motion;Decreased activity tolerance;Decreased balance;Decreased mobility;Decreased knowledge of use of DME;Decreased safety awareness;Decreased knowledge of precautions;Cardiopulmonary status limiting activity  PT Treatment Interventions DME instruction;Gait training;Stair training;Functional mobility training;Therapeutic exercise;Therapeutic activities;Balance training;Patient/family education   PT Goals (Current goals can be found in the Care Plan section) Acute Rehab PT Goals Patient Stated Goal: "to get back home" PT Goal Formulation: With patient/family Time For Goal Achievement: 03/11/15 Potential to Achieve Goals: Good    Frequency Min 2X/week   Barriers to discharge Decreased caregiver support      Co-evaluation               End of Session Equipment Utilized During Treatment: Gait belt Activity Tolerance: Patient tolerated treatment well Patient left: in chair;with call bell/phone within reach;with chair alarm set;with family/visitor present Nurse Communication: Mobility status         Time: [id number] PT Time Calculation (min) (ACUTE ONLY): 32 min   Charges:   PT Evaluation $Initial PT Evaluation Tier I: 1 Procedure PT Treatments $Therapeutic Activity: 8-22 mins   PT G Codes:       Unknown Schleyer H. Owens Shark, PT,  DPT, NCS 02/25/2015, 10:17 AM [phone]

## 2015-02-25 NOTE — Care Management (Signed)
Confirmed with [facility] that patient's home O2 is ordered continuous at 2 liters

## 2015-02-25 NOTE — Care Management (Signed)
Attending anticipates discharge 12/23.  Patient is going to require a declot of her peritoneal dialysis catheter.

## 2015-02-25 NOTE — Care Management (Addendum)
Patient lives at home alone.  States that she performs her own PD.  Patient states that her son lives behind her, and her daughter lives beside her.  Patient obtains her medications from [facility] on [address].  Patient states that she has a walker, BSC, rollator, and WC at home. Patient is currently open with [employer].  Patient states that she has chronic O2 at home, however is not sure of the company that provides it.  RNCM following for discharge planning.  With patient's permission I spoke with her son Gerald Stabs.  Gerald Stabs states that the patient is on continuous chronic O2 through [place].  Patient has a portable tank at home.  Gerald Stabs is to bring portable tank at time of discharge.

## 2015-02-25 NOTE — Progress Notes (Signed)
Peritoneal dialysis is working so far per [employer] - dialysis nurse at bedside. Instructed this RN to go ahead and give subQ epo at this time.

## 2015-02-25 NOTE — Progress Notes (Signed)
Post peritoneal dialysis

## 2015-02-26 DIAGNOSIS — N186 End stage renal disease: Secondary | ICD-10-CM | POA: Diagnosis not present

## 2015-02-26 DIAGNOSIS — Z992 Dependence on renal dialysis: Secondary | ICD-10-CM | POA: Diagnosis not present

## 2015-02-26 LAB — HEMOGLOBIN A1C

## 2015-02-26 LAB — GLUCOSE, CAPILLARY: Glucose-Capillary: 83 mg/dL (ref 65–99)

## 2015-02-26 LAB — MAGNESIUM: MAGNESIUM: 1.6 mg/dL — AB (ref 1.7–2.4)

## 2015-02-26 MED ORDER — BENZONATATE 200 MG PO CAPS: 200.0000 mg | ORAL_CAPSULE | Freq: Three times a day (TID) | ORAL | Status: DC | PRN

## 2015-02-26 MED ORDER — MAGNESIUM OXIDE 400 (241.3 MG) MG PO TABS
800.0000 mg | ORAL_TABLET | Freq: Once | ORAL | Status: AC
  Administered 2015-02-26: 800 mg via ORAL
  Filled 2015-02-26: qty 2

## 2015-02-26 NOTE — Discharge Instructions (Signed)
Heart healthy and renal diet. Activity as tolerated. Continue PD. HHPT.

## 2015-02-26 NOTE — Progress Notes (Signed)
Subjective:  5500 cc of fluid removed with dialysis this admission No lower extremity edema Coughing is better PD catheter is functio ing well now   Objective:  Vital signs in last 24 hours:  Temp:  [98 F (36.7 C)-99 F (37.2 C)] 98.6 F (37 C) (12/23 0600) Pulse Rate:  [87-96] 87 (12/23 0600) Resp:  [20] 20 (12/22 2028) BP: (156-184)/(67-84) 156/67 mmHg (12/23 0600) SpO2:  [94 %-96 %] 94 % (12/23 0600) Weight:  [56.382 kg (124 lb 4.8 oz)] 56.382 kg (124 lb 4.8 oz) (12/23 0500)  Weight change: -3.418 kg (-7 lb 8.6 oz) Filed Weights   02/24/15 1415 02/24/15 1744 02/26/15 0500  Weight: 59.8 kg (131 lb 13.4 oz) 56.6 kg (124 lb 12.5 oz) 56.382 kg (124 lb 4.8 oz)    Intake/Output:    Intake/Output Summary (Last 24 hours) at 02/26/15 [id number] Last data filed at 02/25/15 2231  Gross per 24 hour  Intake    240 ml  Output    152 ml  Net     88 ml     Physical Exam: General:  critically ill-appearing   HEENT  oxygen by nasal cannula  Neck  supple   Pulm/lungs  normal resp effort  CVS/Heart  irregular rhythm, no rub or gallop   Abdomen:   soft, nontender, nondistended   Extremities:  no pitting edema   Neurologic:  alert, oteinted   Skin:  no acute rashes    Access: right IJ PermCath, PD catheter in place       Basic Metabolic Panel:   Recent Labs Lab 02/23/15 0727 02/23/15 1319 02/24/15 1134 02/26/15 0548  NA 135  --  135  --   K 4.4  --  4.4  --   CL 97*  --  97*  --   CO2 26  --  26  --   GLUCOSE 188*  --  125*  --   BUN 40*  --  31*  --   CREATININE 5.78*  --  3.99*  --   CALCIUM 8.1*  --  8.2*  --   MG  --  1.7  --  1.6*     CBC:  Recent Labs Lab 02/23/15 0727 02/24/15 1448  WBC 15.1* 11.4*  NEUTROABS 9.2* 9.8*  HGB 9.5* 9.3*  HCT 30.3* 30.0*  MCV 101.4* 99.5  PLT 452* 419      Microbiology:  Recent Results (from the past 720 hour(s))  Blood culture (routine x 2)     Status: None (Preliminary result)   Collection Time: 02/23/15   7:28 AM  Result Value Ref Range Status   Specimen Description BLOOD RIGHT AC  Final   Special Requests BOTTLES DRAWN AEROBIC AND ANAEROBIC 2ML  Final   Culture NO GROWTH 3 DAYS  Final   Report Status PENDING  Incomplete  Blood culture (routine x 2)     Status: None (Preliminary result)   Collection Time: 02/23/15  7:34 AM  Result Value Ref Range Status   Specimen Description BLOOD RIGHT HAND  Final   Special Requests BOTTLES DRAWN AEROBIC AND ANAEROBIC 7ML  Final   Culture NO GROWTH 3 DAYS  Final   Report Status PENDING  Incomplete  MRSA PCR Screening     Status: None   Collection Time: 02/23/15 11:00 AM  Result Value Ref Range Status   MRSA by PCR NEGATIVE NEGATIVE Final    Comment:        The GeneXpert MRSA Assay (FDA  approved for NASAL specimens only), is one component of a comprehensive MRSA colonization surveillance program. It is not intended to diagnose MRSA infection nor to guide or monitor treatment for MRSA infections.     Coagulation Studies: No results for input(s): LABPROT, INR in the last 72 hours.  Urinalysis: No results for input(s): COLORURINE, LABSPEC, PHURINE, GLUCOSEU, HGBUR, BILIRUBINUR, KETONESUR, PROTEINUR, UROBILINOGEN, NITRITE, LEUKOCYTESUR in the last 72 hours.  Invalid input(s): APPERANCEUR    Imaging: No results found.   Medications:     . antiseptic oral rinse  7 mL Mouth Rinse q12n4p  . aspirin EC  81 mg Oral Daily  . chlorhexidine  15 mL Mouth Rinse BID  . cholecalciferol  2,000 Units Oral Daily  . dialysis solution 2.5% low-MG/low-CA   Intraperitoneal Q24H  . epoetin (EPOGEN/PROCRIT) injection  4,000 Units Subcutaneous Q T,Th,Sa-HD  . feeding supplement (ENSURE ENLIVE)  237 mL Oral BID BM  . furosemide  40 mg Oral Daily  . gentamicin cream  1 application Topical Daily  . heparin  5,000 Units Subcutaneous Q12H  . insulin aspart  0-9 Units Subcutaneous TID WC  . irbesartan  150 mg Oral BID  . LORazepam  1 mg Oral BID  .  magnesium oxide  800 mg Oral Once  . mirtazapine  15 mg Oral QHS  . mometasone-formoterol  2 puff Inhalation BID  . pantoprazole  40 mg Oral Daily  . rosuvastatin  10 mg Oral QPM  . senna  1 tablet Oral BID  . sodium chloride  3 mL Intravenous Q12H  . tiotropium  18 mcg Inhalation QHS     Assessment/ Plan:  74 y.o. Caucasian female with end-stage renal disease on peritoneal dialysis, hypertension, COPD, GERD, hyperlipidemia, generalized anxiety disorder, history depression, anemia of chronic kidney disease, secondary hyperparathyroidism and osteopenia. Chronic systolic congestive heart failure. EF 30-35%. December 2016 echo   1. End Stage Renal Disease:  -  PD catheter is working after TPA developed -  patient will be able to go home on CCPD as it worked good yesterday - Will keep permcath in for next few weeks to make sure there are no problems with PD at home   2. Anemia of chronic kidney disease:  - We will continue Procrit with dialysis  - Hemoglobin 9.3  3. Secondary Hyperparathyroidism:  - monitor phosphorus during the hospitalization   4. Peripheral edema - Volume removal with dialysis  as tolerated - 5500 cc removed so far - Bed weight 56 kg this AM - advised to use mostly Green bags for PD at home     LOS: 3 Niklas Chretien 12/23/20169:22 AM

## 2015-02-26 NOTE — Care Management (Signed)
Return call from [place] [facility].  Notified of discharge.  Patient packet faxed

## 2015-02-26 NOTE — Discharge Summary (Signed)
[facility] [place] NAME: Lydia Clark    MR#:  [id number]  DATE OF BIRTH:  Aug 12, 1940  DATE OF ADMISSION:  02/23/2015 ADMITTING PHYSICIAN: Fritzi Mandes, MD  DATE OF DISCHARGE: 02/26/2015 12:30 PM  PRIMARY CARE PHYSICIAN: Pcp Not In System    ADMISSION DIAGNOSIS:  Acute respiratory failure with hypoxia and hypercarbia (HCC) [J96.01, J96.02]   DISCHARGE DIAGNOSIS:  Acute on chronic hypoxic respiratory failure secondary to pulmonary edema with volume overload. Acute systolic CHF with ejection fraction 30-35% Elevated troponin, possible due to demanding ischemia SECONDARY DIAGNOSIS:   Past Medical History  Diagnosis Date  . Allergy   . Cancer [facility]) 2008    left breast  . Hypertension 2006  . Personal history of tobacco use, presenting hazards to health 2012    30 yrs smoking  . Endocrine disorder 2008    thyroid  . Personal history of malignant neoplasm of breast 2008  . Breast screening, unspecified 2013  . Special screening for malignant neoplasms, colon 2013  . COPD (chronic obstructive pulmonary disease) ([place])   . PE (pulmonary embolism)     patient denies  . Swelling of throat     swelling at base of throat,  . Anxiety     anxiety  . Pneumonia     hx  . Heart murmur     child  . History of kidney stones   . GERD (gastroesophageal reflux disease)   . Arthritis   . Multinodular goiter     followed by Dr. Carloyn Manner @ [facility]  . Dialysis patient ([place]) 2014  . Chronic kidney disease 2014    stage IV chronic     HOSPITAL COURSE:   1. Acute on chronic hypoxic respiratory failure secondary to pulmonary edema with volume overload. She was on BiPAP and then off BiPAP. On oxygen by nasal cannula 2 L ( home O2). No evidence of infection or COPD. Discontinued IV Levaquin and IV Solu-Medrol daily.  2. Acute systolic CHF with ejection fraction 30-35%,  Acute pulmonary edema as noted on chest x-ray and  clinical presentation. Patient has no history of CHF according to old records.  Change to po Lasix 40 daily and hemodialysis.   follow-up cardiologist as outpatient.  *Elevated troponin, possible due to demanding ischemia, follow-up cardiologist as outpatient.  3. End-stage renal disease on Hemodialysis Patient got hemodialysis, fixed and got PD cath yesterday.  Per Dr. Candiss Norse, continue PD as outpatient.   4. Hypertension. continue avapro and lasix.  5. Protein calorie malnutrition. Follow-up dietitian's recommendation.  6. Hyperlipidemia on Crestor  DISCHARGE CONDITIONS:   Stable, discharged to home with home health and PT today.  CONSULTS OBTAINED:  Treatment Team:  Murlean Iba, MD Isaias Cowman, MD  DRUG ALLERGIES:   Allergies  Allergen Reactions  . Bee Venom Swelling and Other (See Comments)    Breathing problems  . Biaxin [Clarithromycin] Other (See Comments)    Throat swells  . Hydrocodone Nausea And Vomiting  . Mycostatin [Nystatin] Other (See Comments)    Blisters from the ointment  . Polysorbate     Other reaction(s): Unknown  . Sulfa Antibiotics Other (See Comments)    "welps"  . Penicillins Rash    DISCHARGE MEDICATIONS:   Discharge Medication List as of 02/26/2015 11:34 AM    START taking these medications   Details  benzonatate (TESSALON) 200 MG capsule Take 1 capsule (200 mg total) by mouth 3 (three) times daily  as needed for cough., Starting 02/26/2015, Until Discontinued, Print      CONTINUE these medications which have NOT CHANGED   Details  albuterol (PROVENTIL HFA;VENTOLIN HFA) 108 (90 BASE) MCG/ACT inhaler Inhale 2 puffs into the lungs every 6 (six) hours as needed for wheezing or shortness of breath., Until Discontinued, Historical Med    aspirin 81 MG tablet Take 81 mg by mouth daily., Until Discontinued, Historical Med    Cholecalciferol (VITAMIN D3) 2000 UNITS TABS Take 1 tablet by mouth daily., Until Discontinued, Historical  Med    feeding supplement, ENSURE ENLIVE, (ENSURE ENLIVE) LIQD Take 237 mLs by mouth 2 (two) times daily between meals., Starting 01/14/2015, Until Discontinued, No Print    fexofenadine (ALLEGRA) 60 MG tablet Take 60 mg by mouth 2 (two) times daily. , Starting 12/30/2013, Until Discontinued, Historical Med    Fluticasone-Salmeterol (ADVAIR DISKUS) 250-50 MCG/DOSE AEPB Inhale 1 puff into the lungs every 12 (twelve) hours., Starting 08/07/2014, Until Sat 08/07/15, Historical Med    furosemide (LASIX) 40 MG tablet Take 40 mg by mouth daily., Starting 12/31/2014, Until Discontinued, Historical Med    gentamicin ointment (GARAMYCIN) 0.1 % Apply 1 application topically at bedtime. , Starting 12/26/2013, Until Discontinued, Historical Med    LORazepam (ATIVAN) 1 MG tablet Take 1 tablet (1 mg total) by mouth at bedtime as needed for anxiety., Starting 01/14/2015, Until Discontinued, Print    mirtazapine (REMERON) 15 MG tablet Take 15 mg by mouth at bedtime., Starting 12/31/2014, Until Discontinued, Historical Med    omeprazole (PRILOSEC) 20 MG capsule Take 20 mg by mouth daily., Until Discontinued, Historical Med    rosuvastatin (CRESTOR) 10 MG tablet Take 10 mg by mouth every evening. , Until Discontinued, Historical Med    tiotropium (SPIRIVA) 18 MCG inhalation capsule Place 18 mcg into inhaler and inhale at bedtime. , Until Discontinued, Historical Med    valsartan (DIOVAN) 160 MG tablet Take 160 mg by mouth 2 (two) times daily. , Starting 01/01/2015, Until Discontinued, Historical Med    acetaminophen (TYLENOL) 325 MG tablet Take 2 tablets (650 mg total) by mouth every 6 (six) hours as needed for mild pain (or Fever >/= 101)., Starting 01/14/2015, Until Discontinued, OTC      STOP taking these medications     levofloxacin (LEVAQUIN) 250 MG tablet      predniSONE (STERAPRED UNI-PAK 21 TAB) 5 MG (21) TBPK tablet          DISCHARGE INSTRUCTIONS:    If you experience worsening of your  admission symptoms, develop shortness of breath, life threatening emergency, suicidal or homicidal thoughts you must seek medical attention immediately by calling 911 or calling your MD immediately  if symptoms less severe.  You Must read complete instructions/literature along with all the possible adverse reactions/side effects for all the Medicines you take and that have been prescribed to you. Take any new Medicines after you have completely understood and accept all the possible adverse reactions/side effects.   Please note  You were cared for by a hospitalist during your hospital stay. If you have any questions about your discharge medications or the care you received while you were in the hospital after you are discharged, you can call the unit and asked to speak with the hospitalist on call if the hospitalist that took care of you is not available. Once you are discharged, your primary care physician will handle any further medical issues. Please note that NO REFILLS for any discharge medications will be authorized once  you are discharged, as it is imperative that you return to your primary care physician (or establish a relationship with a primary care physician if you do not have one) for your aftercare needs so that they can reassess your need for medications and monitor your lab values.    Today   SUBJECTIVE    no complaint.    VITAL SIGNS:  Blood pressure 161/78, pulse 99, temperature 98 F (36.7 C), temperature source Oral, resp. rate 20, height 5\' 7"  (1.702 m), weight 56.382 kg (124 lb 4.8 oz), SpO2 97 %.  I/O:   Intake/Output Summary (Last 24 hours) at 02/26/15 1657 Last data filed at 02/26/15 0900  Gross per 24 hour  Intake      0 ml  Output    152 ml  Net   -152 ml    PHYSICAL EXAMINATION:  GENERAL:  74 y.o.-year-old patient lying in the bed with no acute distress.  EYES: Pupils equal, round, reactive to light and accommodation. No scleral icterus. Extraocular  muscles intact.  HEENT: Head atraumatic, normocephalic. Oropharynx and nasopharynx clear.  NECK:  Supple, no jugular venous distention. No thyroid enlargement, no tenderness.  LUNGS: Normal breath sounds bilaterally, no wheezing, rales,rhonchi or crepitation. No use of accessory muscles of respiration.  CARDIOVASCULAR: S1, S2 normal. No murmurs, rubs, or gallops.  ABDOMEN: Soft, non-tender, non-distended. Bowel sounds present. No organomegaly or mass.  EXTREMITIES: No pedal edema, cyanosis, or clubbing.  NEUROLOGIC: Cranial nerves II through XII are intact. Muscle strength 4/5 in all extremities. Sensation intact. Gait not checked.  PSYCHIATRIC: The patient is alert and oriented x 3.  SKIN: No obvious rash, lesion, or ulcer.   DATA REVIEW:   CBC  Recent Labs Lab 02/24/15 1448  WBC 11.4*  HGB 9.3*  HCT 30.0*  PLT 419    Chemistries   Recent Labs Lab 02/24/15 1134 02/26/15 0548  NA 135  --   K 4.4  --   CL 97*  --   CO2 26  --   GLUCOSE 125*  --   BUN 31*  --   CREATININE 3.99*  --   CALCIUM 8.2*  --   MG  --  1.6*    Cardiac Enzymes  Recent Labs Lab 02/23/15 2334  TROPONINI 0.33*    Microbiology Results  Results for orders placed or performed during the hospital encounter of 02/23/15  Blood culture (routine x 2)     Status: None (Preliminary result)   Collection Time: 02/23/15  7:28 AM  Result Value Ref Range Status   Specimen Description BLOOD RIGHT AC  Final   Special Requests BOTTLES DRAWN AEROBIC AND ANAEROBIC 2ML  Final   Culture NO GROWTH 3 DAYS  Final   Report Status PENDING  Incomplete  Blood culture (routine x 2)     Status: None (Preliminary result)   Collection Time: 02/23/15  7:34 AM  Result Value Ref Range Status   Specimen Description BLOOD RIGHT HAND  Final   Special Requests BOTTLES DRAWN AEROBIC AND ANAEROBIC 7ML  Final   Culture NO GROWTH 3 DAYS  Final   Report Status PENDING  Incomplete  MRSA PCR Screening     Status: None    Collection Time: 02/23/15 11:00 AM  Result Value Ref Range Status   MRSA by PCR NEGATIVE NEGATIVE Final    Comment:        The GeneXpert MRSA Assay (FDA approved for NASAL specimens only), is one component of a comprehensive MRSA  colonization surveillance program. It is not intended to diagnose MRSA infection nor to guide or monitor treatment for MRSA infections.     RADIOLOGY:  No results found.      Management plans discussed with the patient,  Her son and they are in agreement.  CODE STATUS:   TOTAL TIME TAKING CARE OF THIS PATIENT: 35 minutes.    Demetrios Loll M.D on 02/26/2015 at 4:57 PM  Between 7am to 6pm - Pager - [phone]  After 6pm go to [email or web address] - password [email or web address] [facility]  Office  [phone]  CC: Primary care physician; Pcp Not In System

## 2015-02-26 NOTE — Care Management (Signed)
Plan for patient to discharge today.  Patient's son at bedside.  Portable O2 tank available for discharge.  Order to resume home health SN, PT, and aide placed.  Face to face completed.  [facility] has been called to notify of discharge,  Awaiting return call.  RNCM signing off

## 2015-02-26 NOTE — Progress Notes (Signed)
Discharge instructions given to patient and son. Appointments reviewed, patient is to call PCP, Dr. Abigail Butts for an appointment Monday. Prescription given to patient. Education on respiratory failure, smoking cessation, and oxygen use given. No questions at this time. IV's and tele removed.

## 2015-02-26 NOTE — Progress Notes (Signed)
MD, Pyreddy aware that patient had a 11 run of V-tach, patient asymptomatic. Order received for Magnesium Level to be drawn, Lab notified and order in place.

## 2015-02-27 DIAGNOSIS — M1991 Primary osteoarthritis, unspecified site: Secondary | ICD-10-CM | POA: Diagnosis not present

## 2015-02-27 DIAGNOSIS — S72002D Fracture of unspecified part of neck of left femur, subsequent encounter for closed fracture with routine healing: Secondary | ICD-10-CM | POA: Diagnosis not present

## 2015-02-27 DIAGNOSIS — J449 Chronic obstructive pulmonary disease, unspecified: Secondary | ICD-10-CM | POA: Diagnosis not present

## 2015-02-27 DIAGNOSIS — F419 Anxiety disorder, unspecified: Secondary | ICD-10-CM | POA: Diagnosis not present

## 2015-02-27 DIAGNOSIS — I120 Hypertensive chronic kidney disease with stage 5 chronic kidney disease or end stage renal disease: Secondary | ICD-10-CM | POA: Diagnosis not present

## 2015-02-27 DIAGNOSIS — Z992 Dependence on renal dialysis: Secondary | ICD-10-CM | POA: Diagnosis not present

## 2015-02-27 DIAGNOSIS — N186 End stage renal disease: Secondary | ICD-10-CM | POA: Diagnosis not present

## 2015-02-27 MED ORDER — VALSARTAN 160 MG PO TABS: 160.0000 mg | ORAL_TABLET | Freq: Two times a day (BID) | ORAL | Status: DC

## 2015-02-28 DIAGNOSIS — Z992 Dependence on renal dialysis: Secondary | ICD-10-CM | POA: Diagnosis not present

## 2015-02-28 DIAGNOSIS — N186 End stage renal disease: Secondary | ICD-10-CM | POA: Diagnosis not present

## 2015-02-28 LAB — CULTURE, BLOOD (ROUTINE X 2)
Culture: NO GROWTH
Culture: NO GROWTH

## 2015-03-01 DIAGNOSIS — F419 Anxiety disorder, unspecified: Secondary | ICD-10-CM | POA: Diagnosis not present

## 2015-03-01 DIAGNOSIS — J449 Chronic obstructive pulmonary disease, unspecified: Secondary | ICD-10-CM | POA: Diagnosis not present

## 2015-03-01 DIAGNOSIS — I120 Hypertensive chronic kidney disease with stage 5 chronic kidney disease or end stage renal disease: Secondary | ICD-10-CM | POA: Diagnosis not present

## 2015-03-01 DIAGNOSIS — Z992 Dependence on renal dialysis: Secondary | ICD-10-CM | POA: Diagnosis not present

## 2015-03-01 DIAGNOSIS — S72002D Fracture of unspecified part of neck of left femur, subsequent encounter for closed fracture with routine healing: Secondary | ICD-10-CM | POA: Diagnosis not present

## 2015-03-01 DIAGNOSIS — N186 End stage renal disease: Secondary | ICD-10-CM | POA: Diagnosis not present

## 2015-03-01 DIAGNOSIS — M1991 Primary osteoarthritis, unspecified site: Secondary | ICD-10-CM | POA: Diagnosis not present

## 2015-03-02 DIAGNOSIS — N186 End stage renal disease: Secondary | ICD-10-CM | POA: Diagnosis not present

## 2015-03-02 DIAGNOSIS — F419 Anxiety disorder, unspecified: Secondary | ICD-10-CM | POA: Diagnosis not present

## 2015-03-02 DIAGNOSIS — S72002D Fracture of unspecified part of neck of left femur, subsequent encounter for closed fracture with routine healing: Secondary | ICD-10-CM | POA: Diagnosis not present

## 2015-03-02 DIAGNOSIS — M1991 Primary osteoarthritis, unspecified site: Secondary | ICD-10-CM | POA: Diagnosis not present

## 2015-03-02 DIAGNOSIS — Z992 Dependence on renal dialysis: Secondary | ICD-10-CM | POA: Diagnosis not present

## 2015-03-02 DIAGNOSIS — J449 Chronic obstructive pulmonary disease, unspecified: Secondary | ICD-10-CM | POA: Diagnosis not present

## 2015-03-02 DIAGNOSIS — I120 Hypertensive chronic kidney disease with stage 5 chronic kidney disease or end stage renal disease: Secondary | ICD-10-CM | POA: Diagnosis not present

## 2015-03-03 ENCOUNTER — Ambulatory Visit
Admission: RE | Admit: 2015-03-03 | Discharge: 2015-03-03 | Disposition: A | Discharge status: Home / Self Care | Payer: Medicare Other | Source: Ambulatory Visit | Attending: Internal Medicine | Admitting: Internal Medicine

## 2015-03-03 ENCOUNTER — Ambulatory Visit (INDEPENDENT_AMBULATORY_CARE_PROVIDER_SITE_OTHER): Payer: Medicare Other | Admitting: Internal Medicine

## 2015-03-03 ENCOUNTER — Encounter: Payer: Self-pay | Admitting: Internal Medicine

## 2015-03-03 ENCOUNTER — Telehealth: Payer: Self-pay | Admitting: *Deleted

## 2015-03-03 VITALS — BP 132/80 | HR 91 | Ht 69.0 in | Wt 126.0 lb

## 2015-03-03 DIAGNOSIS — J449 Chronic obstructive pulmonary disease, unspecified: Secondary | ICD-10-CM | POA: Insufficient documentation

## 2015-03-03 DIAGNOSIS — N186 End stage renal disease: Secondary | ICD-10-CM | POA: Diagnosis not present

## 2015-03-03 DIAGNOSIS — J90 Pleural effusion, not elsewhere classified: Secondary | ICD-10-CM | POA: Diagnosis not present

## 2015-03-03 DIAGNOSIS — Z452 Encounter for adjustment and management of vascular access device: Secondary | ICD-10-CM | POA: Diagnosis not present

## 2015-03-03 DIAGNOSIS — R0602 Shortness of breath: Secondary | ICD-10-CM | POA: Diagnosis not present

## 2015-03-03 DIAGNOSIS — R05 Cough: Secondary | ICD-10-CM | POA: Diagnosis not present

## 2015-03-03 DIAGNOSIS — Z992 Dependence on renal dialysis: Secondary | ICD-10-CM | POA: Diagnosis not present

## 2015-03-03 MED ORDER — BENZONATATE 100 MG PO CAPS: 100.0000 mg | ORAL_CAPSULE | Freq: Three times a day (TID) | ORAL | Status: DC

## 2015-03-03 MED ORDER — TIOTROPIUM BROMIDE MONOHYDRATE 18 MCG IN CAPS: 18.0000 ug | ORAL_CAPSULE | Freq: Every day | RESPIRATORY_TRACT | Status: AC

## 2015-03-03 MED ORDER — FLUTICASONE-SALMETEROL 250-50 MCG/DOSE IN AEPB: 1.0000 | INHALATION_SPRAY | Freq: Two times a day (BID) | RESPIRATORY_TRACT | Status: AC

## 2015-03-03 NOTE — Telephone Encounter (Signed)
Please call Home Health nurse Garnette Gunner at [phone] regarding Kavon Weaks visit today. Thank you.

## 2015-03-03 NOTE — Telephone Encounter (Signed)
They state Levada Dy wasn't available and that she was in a meeting. LM for her to call me back.

## 2015-03-03 NOTE — Progress Notes (Signed)
* [facility]     Assessment and Plan:  Emphysema/COPD -The patient has severe emphysema, with air trapping. Her Advair and Spiriva were renewed today.  Cough -Likely due to chronic bronchitis, and chronic pulmonary edema from volume overload. -Continue inhalers as above, the patient is asked to continue Lasix as she continues to make urine. -We'll check chest x-ray.  Tobacco abuse. -She's recently stopped smoking, we discussed importance of continued cessation.  Chronic systolic congestive heart failure. -Ejection fraction 30%, we will continue diuresis.  End-stage renal disease. -Her end-stage renal disease and chronic volume overload are likely contributing to her chronic pulmonary edema which contributes to dyspnea.  Deconditioning/debility. -Patient appears to be very deconditioned and debilitated. Today she has significant weakness. She is minimally ambulatory. -We discussed with her son that her long-term prognosis is likely poor. The patient does not think that she would want to be resuscitated if her heart were to stop or she would require a ventilator. -Discussed some end-of-life issues today, and we will refer them to palliative care for further discussions and help try to keep her out of the hospital.  Date: 03/03/2015  MRN# [id number] KADRA DIMOND 08-Oct-1940   Lydia Clark is a 74 y.o. old female seen in follow up for chief complaint of  Chief Complaint  Patient presents with  . Hospitalization Follow-up    pt. states breathing is baseline since being discharged. occ. SOB. prod. cough light yellow in color. occ. wheezing. denies chest pain/tightness.      HPI:  74 yo female smoker with ESRD on PD, Left Breast Cancer, COPD. She last saw Dr. Stevenson Clinch in April 2016 for persistent cough. Review of those medical records shows that the patient has a history of COPD and was asked to continue Spiriva, Advair was discontinued as the patient appeared  to have been doing fairly well.  She was recently discharged from the hospital on 02/26/2015 after she had a congestive heart failure exacerbation, she is seen in follow-up at the office at this time. During that hospitalization she was switched to hemodialysis and had fluid removed. She was then discharged with PD.     She is present today with her son who provides much of the history.  She is currently on 2L oxygen, she is sitting in a wheelchair. She feels that her breathing is ok as long as she wears oxygen. She can not walk well, and needs help. She has fallen in the past and broken her hip in November of 2016 and was admitted to [facility] and had a hip replaced, went to rehab for 10 days, she walked some after that, but has not really been walking since that time.  Dozes frequently during the day.    -Echocardiogram 02/23/2015: EF 30%. Pulmonary Functions Testing Results: -Pulmonary function test 05/11/2014, review of the tracings and raw data; FVC was 77 productive percent of predicted, FEV1 was 51% of predicted, FEV to FVC was 52. Total lung capacity was 250%, RV/TLC ratio was 185%, DLCO was 38%. Overall this test is consistent with moderate/borderline severe emphysema with severe air trapping.    Medication:   Outpatient Encounter Prescriptions as of 03/03/2015  Medication Sig  . acetaminophen (TYLENOL) 325 MG tablet Take 2 tablets (650 mg total) by mouth every 6 (six) hours as needed for mild pain (or Fever >/= 101).  Marland Kitchen albuterol (PROVENTIL HFA;VENTOLIN HFA) 108 (90 BASE) MCG/ACT inhaler Inhale 2 puffs into the lungs every 6 (six) hours as  needed for wheezing or shortness of breath.  Marland Kitchen aspirin 81 MG tablet Take 81 mg by mouth daily.  . benzonatate (TESSALON) 200 MG capsule Take 1 capsule (200 mg total) by mouth 3 (three) times daily as needed for cough.  . Cholecalciferol (VITAMIN D3) 2000 UNITS TABS Take 1 tablet by mouth daily.  . feeding supplement, ENSURE ENLIVE, (ENSURE ENLIVE) LIQD  Take 237 mLs by mouth 2 (two) times daily between meals.  . fexofenadine (ALLEGRA) 60 MG tablet Take 60 mg by mouth 2 (two) times daily.   . Fluticasone-Salmeterol (ADVAIR DISKUS) 250-50 MCG/DOSE AEPB Inhale 1 puff into the lungs every 12 (twelve) hours.  . furosemide (LASIX) 40 MG tablet Take 40 mg by mouth daily.  Marland Kitchen gentamicin ointment (GARAMYCIN) 0.1 % Apply 1 application topically at bedtime.   Marland Kitchen LORazepam (ATIVAN) 1 MG tablet Take 1 tablet (1 mg total) by mouth at bedtime as needed for anxiety. (Patient taking differently: Take 1 mg by mouth 2 (two) times daily. )  . mirtazapine (REMERON) 15 MG tablet Take 15 mg by mouth at bedtime.  Marland Kitchen omeprazole (PRILOSEC) 20 MG capsule Take 20 mg by mouth daily.  . rosuvastatin (CRESTOR) 10 MG tablet Take 10 mg by mouth every evening.   . tiotropium (SPIRIVA) 18 MCG inhalation capsule Place 18 mcg into inhaler and inhale at bedtime.   . valsartan (DIOVAN) 160 MG tablet Take 1 tablet (160 mg total) by mouth 2 (two) times daily.   No facility-administered encounter medications on file as of 03/03/2015.     Allergies:  Bee venom; Biaxin; Hydrocodone; Mycostatin; Polysorbate; Sulfa antibiotics; and Penicillins  Review of Systems: Gen:  Denies  fever, sweats. HEENT: Denies blurred vision. Cvc:  No dizziness, chest pain or heaviness Resp:   Denies sputum porduction. Gi: Denies swallowing difficulty, stomach pain. constipation, bowel incontinence Gu:  Denies bladder incontinence, burning urine Ext:   No Joint pain, stiffness. Skin: No skin rash, easy bruising. Endoc:  No polyuria, polydipsia. Psych: No depression, insomnia. Other:  All other systems were reviewed and found to be negative other than what is mentioned in the HPI.   Physical Examination:   VS: BP 132/80 mmHg  Pulse 91  Ht 5\' 9"  (1.753 m)  Wt 126 lb (57.153 kg)  BMI 18.60 kg/m2  SpO2 92%  Dressed in pajamas. Appears weak and fatigued.  General Appearance: No distress    Neuro:without focal findings,  speech normal,  HEENT: PERRLA, EOM intact. Pulmonary: Scattered rhonchi.   CardiovascularNormal S1,S2.  No m/r/g.   Abdomen: Benign, Soft, non-tender. Renal:  No costovertebral tenderness  GU:  Not performed at this time. Endoc: No evident thyromegaly, no signs of acromegaly. Skin:   warm, no rash. Extremities: normal, no cyanosis, clubbing.   LABORATORY PANEL:   CBC  Recent Labs Lab 02/24/15 1448  WBC 11.4*  HGB 9.3*  HCT 30.0*  PLT 419   ------------------------------------------------------------------------------------------------------------------  Chemistries   Recent Labs Lab 02/24/15 1134 02/26/15 0548  NA 135  --   K 4.4  --   CL 97*  --   CO2 26  --   GLUCOSE 125*  --   BUN 31*  --   CREATININE 3.99*  --   CALCIUM 8.2*  --   MG  --  1.6*   ------------------------------------------------------------------------------------------------------------------  Cardiac Enzymes No results for input(s): TROPONINI in the last 168 hours. ------------------------------------------------------------  RADIOLOGY:   No results found for this or any previous visit. Results for orders placed in  visit on 04/07/14  DG Chest 2 View   ------------------------------------------------------------------------------------------------------------------  Thank  you for allowing [facility] to assist in the care of your patient. Our recommendations are noted above.  Please contact us if we can be of further service.   Marda Stalker, MD.  [facility]   Patricia Pesa, M.D.  Vilinda Boehringer, M.D.  Merton Border, M.D

## 2015-03-03 NOTE — Patient Instructions (Addendum)
--  Continue tessalon for cough.  --Can add robitussin DM as needed for cough.  --Will refill advair and spiriva.  --Refer to palliative care for further discussion around trying to reduce her chances of readmission.  --Will check 2 view CXR.

## 2015-03-04 DIAGNOSIS — Z992 Dependence on renal dialysis: Secondary | ICD-10-CM | POA: Diagnosis not present

## 2015-03-04 DIAGNOSIS — F419 Anxiety disorder, unspecified: Secondary | ICD-10-CM | POA: Diagnosis not present

## 2015-03-04 DIAGNOSIS — N186 End stage renal disease: Secondary | ICD-10-CM | POA: Diagnosis not present

## 2015-03-04 DIAGNOSIS — S72002D Fracture of unspecified part of neck of left femur, subsequent encounter for closed fracture with routine healing: Secondary | ICD-10-CM | POA: Diagnosis not present

## 2015-03-04 DIAGNOSIS — J449 Chronic obstructive pulmonary disease, unspecified: Secondary | ICD-10-CM | POA: Diagnosis not present

## 2015-03-04 DIAGNOSIS — I120 Hypertensive chronic kidney disease with stage 5 chronic kidney disease or end stage renal disease: Secondary | ICD-10-CM | POA: Diagnosis not present

## 2015-03-04 DIAGNOSIS — M1991 Primary osteoarthritis, unspecified site: Secondary | ICD-10-CM | POA: Diagnosis not present

## 2015-03-04 NOTE — Telephone Encounter (Signed)
Spoke with Levada Dy with [facility]. She is the [facility] that goes out to the pt's home. She states that they have the pt set up to do some PT and that the pt's PCP has given and order for the pt to get a hospital bed for her home. She said the son had to her we were willing to place an order through [facility] but she states that they can get the hospice for the pt when she wants it set up. States they have already had a Education officer, museum to go out to the pt's home to discuss the PACE program with the pt and family. States she will keeps [place] informed. Nothing further needed.

## 2015-03-05 DIAGNOSIS — J449 Chronic obstructive pulmonary disease, unspecified: Secondary | ICD-10-CM | POA: Diagnosis not present

## 2015-03-05 DIAGNOSIS — I120 Hypertensive chronic kidney disease with stage 5 chronic kidney disease or end stage renal disease: Secondary | ICD-10-CM | POA: Diagnosis not present

## 2015-03-05 DIAGNOSIS — M1991 Primary osteoarthritis, unspecified site: Secondary | ICD-10-CM | POA: Diagnosis not present

## 2015-03-05 DIAGNOSIS — S72002D Fracture of unspecified part of neck of left femur, subsequent encounter for closed fracture with routine healing: Secondary | ICD-10-CM | POA: Diagnosis not present

## 2015-03-05 DIAGNOSIS — F419 Anxiety disorder, unspecified: Secondary | ICD-10-CM | POA: Diagnosis not present

## 2015-03-05 DIAGNOSIS — N186 End stage renal disease: Secondary | ICD-10-CM | POA: Diagnosis not present

## 2015-03-05 DIAGNOSIS — Z992 Dependence on renal dialysis: Secondary | ICD-10-CM | POA: Diagnosis not present

## 2015-03-06 DIAGNOSIS — N186 End stage renal disease: Secondary | ICD-10-CM | POA: Diagnosis not present

## 2015-03-06 DIAGNOSIS — Z992 Dependence on renal dialysis: Secondary | ICD-10-CM | POA: Diagnosis not present

## 2015-03-07 DIAGNOSIS — N186 End stage renal disease: Secondary | ICD-10-CM | POA: Diagnosis not present

## 2015-03-07 DIAGNOSIS — Z992 Dependence on renal dialysis: Secondary | ICD-10-CM | POA: Diagnosis not present

## 2015-03-08 DIAGNOSIS — N186 End stage renal disease: Secondary | ICD-10-CM | POA: Diagnosis not present

## 2015-03-08 DIAGNOSIS — Z992 Dependence on renal dialysis: Secondary | ICD-10-CM | POA: Diagnosis not present

## 2015-03-09 DIAGNOSIS — F419 Anxiety disorder, unspecified: Secondary | ICD-10-CM | POA: Diagnosis not present

## 2015-03-09 DIAGNOSIS — J449 Chronic obstructive pulmonary disease, unspecified: Secondary | ICD-10-CM | POA: Diagnosis not present

## 2015-03-09 DIAGNOSIS — I120 Hypertensive chronic kidney disease with stage 5 chronic kidney disease or end stage renal disease: Secondary | ICD-10-CM | POA: Diagnosis not present

## 2015-03-09 DIAGNOSIS — Z992 Dependence on renal dialysis: Secondary | ICD-10-CM | POA: Diagnosis not present

## 2015-03-09 DIAGNOSIS — S72002D Fracture of unspecified part of neck of left femur, subsequent encounter for closed fracture with routine healing: Secondary | ICD-10-CM | POA: Diagnosis not present

## 2015-03-09 DIAGNOSIS — M1991 Primary osteoarthritis, unspecified site: Secondary | ICD-10-CM | POA: Diagnosis not present

## 2015-03-09 DIAGNOSIS — N186 End stage renal disease: Secondary | ICD-10-CM | POA: Diagnosis not present

## 2015-03-10 DIAGNOSIS — Z992 Dependence on renal dialysis: Secondary | ICD-10-CM | POA: Diagnosis not present

## 2015-03-10 DIAGNOSIS — N186 End stage renal disease: Secondary | ICD-10-CM | POA: Diagnosis not present

## 2015-03-11 DIAGNOSIS — I120 Hypertensive chronic kidney disease with stage 5 chronic kidney disease or end stage renal disease: Secondary | ICD-10-CM | POA: Diagnosis not present

## 2015-03-11 DIAGNOSIS — M1991 Primary osteoarthritis, unspecified site: Secondary | ICD-10-CM | POA: Diagnosis not present

## 2015-03-11 DIAGNOSIS — Z992 Dependence on renal dialysis: Secondary | ICD-10-CM | POA: Diagnosis not present

## 2015-03-11 DIAGNOSIS — J449 Chronic obstructive pulmonary disease, unspecified: Secondary | ICD-10-CM | POA: Diagnosis not present

## 2015-03-11 DIAGNOSIS — S72002D Fracture of unspecified part of neck of left femur, subsequent encounter for closed fracture with routine healing: Secondary | ICD-10-CM | POA: Diagnosis not present

## 2015-03-11 DIAGNOSIS — F419 Anxiety disorder, unspecified: Secondary | ICD-10-CM | POA: Diagnosis not present

## 2015-03-11 DIAGNOSIS — N186 End stage renal disease: Secondary | ICD-10-CM | POA: Diagnosis not present

## 2015-03-12 ENCOUNTER — Telehealth: Payer: Self-pay | Admitting: *Deleted

## 2015-03-12 DIAGNOSIS — F419 Anxiety disorder, unspecified: Secondary | ICD-10-CM | POA: Diagnosis not present

## 2015-03-12 DIAGNOSIS — M1991 Primary osteoarthritis, unspecified site: Secondary | ICD-10-CM | POA: Diagnosis not present

## 2015-03-12 DIAGNOSIS — S72002D Fracture of unspecified part of neck of left femur, subsequent encounter for closed fracture with routine healing: Secondary | ICD-10-CM | POA: Diagnosis not present

## 2015-03-12 DIAGNOSIS — Z992 Dependence on renal dialysis: Secondary | ICD-10-CM | POA: Diagnosis not present

## 2015-03-12 DIAGNOSIS — J449 Chronic obstructive pulmonary disease, unspecified: Secondary | ICD-10-CM | POA: Diagnosis not present

## 2015-03-12 DIAGNOSIS — N186 End stage renal disease: Secondary | ICD-10-CM | POA: Diagnosis not present

## 2015-03-12 DIAGNOSIS — I120 Hypertensive chronic kidney disease with stage 5 chronic kidney disease or end stage renal disease: Secondary | ICD-10-CM | POA: Diagnosis not present

## 2015-03-12 MED ORDER — HYDROCOD POLST-CPM POLST ER 10-8 MG/5ML PO SUER: 5.0000 mL | Freq: Two times a day (BID) | ORAL | Status: DC | PRN

## 2015-03-12 NOTE — Telephone Encounter (Signed)
Called and spoke with Levada Dy at [facility].  Advised her of PR's recommendations. Will need to hold for Misty to print out RX and give to PR to sign.

## 2015-03-12 NOTE — Telephone Encounter (Signed)
Levada Dy the [facility] states the son has called stating that he is giving the pt the Tessalon [place] 100mg  TID as prescribed. At breakfast, lunch and bedtime. States the dose from lunch and bedtime is not lasting long enough.states when pt gets bedtime dose she can't go to sleep due to the cough is worse. Is asking if we can increase the dose or get something different for pt's cough. Please advise.

## 2015-03-12 NOTE — Telephone Encounter (Signed)
Tried to call son to come by to pick up rx. Unable to contact. Lydia Clark, [facility] with [facility] to contact son to have him to come by before 5pm to pick up rx. Nothing further needed.

## 2015-03-12 NOTE — Telephone Encounter (Signed)
Can start her on Tussionex 5 ML's twice daily as needed. Two-week supply, no refills.

## 2015-03-13 DIAGNOSIS — N186 End stage renal disease: Secondary | ICD-10-CM | POA: Diagnosis not present

## 2015-03-13 DIAGNOSIS — Z992 Dependence on renal dialysis: Secondary | ICD-10-CM | POA: Diagnosis not present

## 2015-03-14 DIAGNOSIS — Z992 Dependence on renal dialysis: Secondary | ICD-10-CM | POA: Diagnosis not present

## 2015-03-14 DIAGNOSIS — N186 End stage renal disease: Secondary | ICD-10-CM | POA: Diagnosis not present

## 2015-03-15 DIAGNOSIS — N186 End stage renal disease: Secondary | ICD-10-CM | POA: Diagnosis not present

## 2015-03-15 DIAGNOSIS — Z992 Dependence on renal dialysis: Secondary | ICD-10-CM | POA: Diagnosis not present

## 2015-03-16 DIAGNOSIS — I120 Hypertensive chronic kidney disease with stage 5 chronic kidney disease or end stage renal disease: Secondary | ICD-10-CM | POA: Diagnosis not present

## 2015-03-16 DIAGNOSIS — F419 Anxiety disorder, unspecified: Secondary | ICD-10-CM | POA: Diagnosis not present

## 2015-03-16 DIAGNOSIS — M1991 Primary osteoarthritis, unspecified site: Secondary | ICD-10-CM | POA: Diagnosis not present

## 2015-03-16 DIAGNOSIS — S72002D Fracture of unspecified part of neck of left femur, subsequent encounter for closed fracture with routine healing: Secondary | ICD-10-CM | POA: Diagnosis not present

## 2015-03-16 DIAGNOSIS — N186 End stage renal disease: Secondary | ICD-10-CM | POA: Diagnosis not present

## 2015-03-16 DIAGNOSIS — J449 Chronic obstructive pulmonary disease, unspecified: Secondary | ICD-10-CM | POA: Diagnosis not present

## 2015-03-16 DIAGNOSIS — Z992 Dependence on renal dialysis: Secondary | ICD-10-CM | POA: Diagnosis not present

## 2015-03-17 DIAGNOSIS — N186 End stage renal disease: Secondary | ICD-10-CM | POA: Diagnosis not present

## 2015-03-17 DIAGNOSIS — Z992 Dependence on renal dialysis: Secondary | ICD-10-CM | POA: Diagnosis not present

## 2015-03-19 DIAGNOSIS — S72002D Fracture of unspecified part of neck of left femur, subsequent encounter for closed fracture with routine healing: Secondary | ICD-10-CM | POA: Diagnosis not present

## 2015-03-19 DIAGNOSIS — F419 Anxiety disorder, unspecified: Secondary | ICD-10-CM | POA: Diagnosis not present

## 2015-03-19 DIAGNOSIS — I120 Hypertensive chronic kidney disease with stage 5 chronic kidney disease or end stage renal disease: Secondary | ICD-10-CM | POA: Diagnosis not present

## 2015-03-19 DIAGNOSIS — M1991 Primary osteoarthritis, unspecified site: Secondary | ICD-10-CM | POA: Diagnosis not present

## 2015-03-19 DIAGNOSIS — N186 End stage renal disease: Secondary | ICD-10-CM | POA: Diagnosis not present

## 2015-03-19 DIAGNOSIS — J449 Chronic obstructive pulmonary disease, unspecified: Secondary | ICD-10-CM | POA: Diagnosis not present

## 2015-03-22 DIAGNOSIS — S72002D Fracture of unspecified part of neck of left femur, subsequent encounter for closed fracture with routine healing: Secondary | ICD-10-CM | POA: Diagnosis not present

## 2015-03-22 DIAGNOSIS — I120 Hypertensive chronic kidney disease with stage 5 chronic kidney disease or end stage renal disease: Secondary | ICD-10-CM | POA: Diagnosis not present

## 2015-03-22 DIAGNOSIS — M1991 Primary osteoarthritis, unspecified site: Secondary | ICD-10-CM | POA: Diagnosis not present

## 2015-03-22 DIAGNOSIS — J449 Chronic obstructive pulmonary disease, unspecified: Secondary | ICD-10-CM | POA: Diagnosis not present

## 2015-03-22 DIAGNOSIS — F419 Anxiety disorder, unspecified: Secondary | ICD-10-CM | POA: Diagnosis not present

## 2015-03-22 DIAGNOSIS — N186 End stage renal disease: Secondary | ICD-10-CM | POA: Diagnosis not present

## 2015-03-23 DIAGNOSIS — S72002D Fracture of unspecified part of neck of left femur, subsequent encounter for closed fracture with routine healing: Secondary | ICD-10-CM | POA: Diagnosis not present

## 2015-03-23 DIAGNOSIS — M1991 Primary osteoarthritis, unspecified site: Secondary | ICD-10-CM | POA: Diagnosis not present

## 2015-03-23 DIAGNOSIS — N186 End stage renal disease: Secondary | ICD-10-CM | POA: Diagnosis not present

## 2015-03-23 DIAGNOSIS — I120 Hypertensive chronic kidney disease with stage 5 chronic kidney disease or end stage renal disease: Secondary | ICD-10-CM | POA: Diagnosis not present

## 2015-03-23 DIAGNOSIS — F419 Anxiety disorder, unspecified: Secondary | ICD-10-CM | POA: Diagnosis not present

## 2015-03-23 DIAGNOSIS — J449 Chronic obstructive pulmonary disease, unspecified: Secondary | ICD-10-CM | POA: Diagnosis not present

## 2015-03-24 ENCOUNTER — Ambulatory Visit: Payer: TRICARE For Life (TFL) | Admitting: Family

## 2015-03-25 DIAGNOSIS — I120 Hypertensive chronic kidney disease with stage 5 chronic kidney disease or end stage renal disease: Secondary | ICD-10-CM | POA: Diagnosis not present

## 2015-03-25 DIAGNOSIS — F419 Anxiety disorder, unspecified: Secondary | ICD-10-CM | POA: Diagnosis not present

## 2015-03-25 DIAGNOSIS — J449 Chronic obstructive pulmonary disease, unspecified: Secondary | ICD-10-CM | POA: Diagnosis not present

## 2015-03-25 DIAGNOSIS — S72002D Fracture of unspecified part of neck of left femur, subsequent encounter for closed fracture with routine healing: Secondary | ICD-10-CM | POA: Diagnosis not present

## 2015-03-25 DIAGNOSIS — N186 End stage renal disease: Secondary | ICD-10-CM | POA: Diagnosis not present

## 2015-03-25 DIAGNOSIS — M1991 Primary osteoarthritis, unspecified site: Secondary | ICD-10-CM | POA: Diagnosis not present

## 2015-03-29 DIAGNOSIS — N186 End stage renal disease: Secondary | ICD-10-CM | POA: Diagnosis not present

## 2015-03-29 DIAGNOSIS — M1991 Primary osteoarthritis, unspecified site: Secondary | ICD-10-CM | POA: Diagnosis not present

## 2015-03-29 DIAGNOSIS — F419 Anxiety disorder, unspecified: Secondary | ICD-10-CM | POA: Diagnosis not present

## 2015-03-29 DIAGNOSIS — I120 Hypertensive chronic kidney disease with stage 5 chronic kidney disease or end stage renal disease: Secondary | ICD-10-CM | POA: Diagnosis not present

## 2015-03-29 DIAGNOSIS — S72002D Fracture of unspecified part of neck of left femur, subsequent encounter for closed fracture with routine healing: Secondary | ICD-10-CM | POA: Diagnosis not present

## 2015-03-29 DIAGNOSIS — J449 Chronic obstructive pulmonary disease, unspecified: Secondary | ICD-10-CM | POA: Diagnosis not present

## 2015-03-31 ENCOUNTER — Telehealth: Payer: Self-pay | Admitting: *Deleted

## 2015-03-31 DIAGNOSIS — N186 End stage renal disease: Secondary | ICD-10-CM | POA: Diagnosis not present

## 2015-03-31 DIAGNOSIS — F419 Anxiety disorder, unspecified: Secondary | ICD-10-CM | POA: Diagnosis not present

## 2015-03-31 DIAGNOSIS — S72002D Fracture of unspecified part of neck of left femur, subsequent encounter for closed fracture with routine healing: Secondary | ICD-10-CM | POA: Diagnosis not present

## 2015-03-31 DIAGNOSIS — J449 Chronic obstructive pulmonary disease, unspecified: Secondary | ICD-10-CM | POA: Diagnosis not present

## 2015-03-31 DIAGNOSIS — M1991 Primary osteoarthritis, unspecified site: Secondary | ICD-10-CM | POA: Diagnosis not present

## 2015-03-31 DIAGNOSIS — I120 Hypertensive chronic kidney disease with stage 5 chronic kidney disease or end stage renal disease: Secondary | ICD-10-CM | POA: Diagnosis not present

## 2015-03-31 NOTE — Telephone Encounter (Signed)
Lydia Clark called stating family is requesting Hospice Care and is asking for a VO.  Spoke with son Lydia Clark and he confirms they are ready for Hospice.  Per DR, give VO for Hospice Care through [facility]. Order given to [place]. Nothing further needed.

## 2015-04-02 ENCOUNTER — Encounter: Payer: Self-pay | Admitting: Family

## 2015-04-02 ENCOUNTER — Ambulatory Visit: Payer: Medicare Other | Attending: Family | Admitting: Family

## 2015-04-02 VITALS — BP 120/64 | HR 81 | Resp 20 | Ht 67.0 in | Wt 116.0 lb

## 2015-04-02 DIAGNOSIS — Z91030 Bee allergy status: Secondary | ICD-10-CM | POA: Diagnosis not present

## 2015-04-02 DIAGNOSIS — R531 Weakness: Secondary | ICD-10-CM | POA: Diagnosis not present

## 2015-04-02 DIAGNOSIS — Z992 Dependence on renal dialysis: Secondary | ICD-10-CM | POA: Diagnosis not present

## 2015-04-02 DIAGNOSIS — Z853 Personal history of malignant neoplasm of breast: Secondary | ICD-10-CM | POA: Diagnosis not present

## 2015-04-02 DIAGNOSIS — N186 End stage renal disease: Secondary | ICD-10-CM

## 2015-04-02 DIAGNOSIS — J9691 Respiratory failure, unspecified with hypoxia: Secondary | ICD-10-CM | POA: Diagnosis not present

## 2015-04-02 DIAGNOSIS — Z86711 Personal history of pulmonary embolism: Secondary | ICD-10-CM | POA: Insufficient documentation

## 2015-04-02 DIAGNOSIS — Z87891 Personal history of nicotine dependence: Secondary | ICD-10-CM | POA: Diagnosis not present

## 2015-04-02 DIAGNOSIS — Z87442 Personal history of urinary calculi: Secondary | ICD-10-CM | POA: Diagnosis not present

## 2015-04-02 DIAGNOSIS — M1990 Unspecified osteoarthritis, unspecified site: Secondary | ICD-10-CM | POA: Insufficient documentation

## 2015-04-02 DIAGNOSIS — Z923 Personal history of irradiation: Secondary | ICD-10-CM | POA: Diagnosis not present

## 2015-04-02 DIAGNOSIS — Z7982 Long term (current) use of aspirin: Secondary | ICD-10-CM | POA: Insufficient documentation

## 2015-04-02 DIAGNOSIS — M25552 Pain in left hip: Secondary | ICD-10-CM | POA: Diagnosis not present

## 2015-04-02 DIAGNOSIS — N184 Chronic kidney disease, stage 4 (severe): Secondary | ICD-10-CM | POA: Diagnosis not present

## 2015-04-02 DIAGNOSIS — Z79899 Other long term (current) drug therapy: Secondary | ICD-10-CM | POA: Insufficient documentation

## 2015-04-02 DIAGNOSIS — J449 Chronic obstructive pulmonary disease, unspecified: Secondary | ICD-10-CM | POA: Diagnosis not present

## 2015-04-02 DIAGNOSIS — Z885 Allergy status to narcotic agent status: Secondary | ICD-10-CM | POA: Insufficient documentation

## 2015-04-02 DIAGNOSIS — K219 Gastro-esophageal reflux disease without esophagitis: Secondary | ICD-10-CM | POA: Diagnosis not present

## 2015-04-02 DIAGNOSIS — Z882 Allergy status to sulfonamides status: Secondary | ICD-10-CM | POA: Insufficient documentation

## 2015-04-02 DIAGNOSIS — Z881 Allergy status to other antibiotic agents status: Secondary | ICD-10-CM | POA: Diagnosis not present

## 2015-04-02 DIAGNOSIS — Z9981 Dependence on supplemental oxygen: Secondary | ICD-10-CM | POA: Diagnosis not present

## 2015-04-02 DIAGNOSIS — Z880 Allergy status to penicillin: Secondary | ICD-10-CM | POA: Diagnosis not present

## 2015-04-02 DIAGNOSIS — I129 Hypertensive chronic kidney disease with stage 1 through stage 4 chronic kidney disease, or unspecified chronic kidney disease: Secondary | ICD-10-CM | POA: Insufficient documentation

## 2015-04-02 DIAGNOSIS — Z9181 History of falling: Secondary | ICD-10-CM | POA: Diagnosis not present

## 2015-04-02 DIAGNOSIS — R627 Adult failure to thrive: Secondary | ICD-10-CM | POA: Diagnosis not present

## 2015-04-02 DIAGNOSIS — I509 Heart failure, unspecified: Secondary | ICD-10-CM | POA: Diagnosis not present

## 2015-04-02 DIAGNOSIS — J441 Chronic obstructive pulmonary disease with (acute) exacerbation: Secondary | ICD-10-CM | POA: Diagnosis not present

## 2015-04-02 DIAGNOSIS — I5022 Chronic systolic (congestive) heart failure: Secondary | ICD-10-CM | POA: Insufficient documentation

## 2015-04-02 DIAGNOSIS — Z812 Family history of tobacco abuse and dependence: Secondary | ICD-10-CM | POA: Diagnosis not present

## 2015-04-02 NOTE — Patient Instructions (Signed)
Begin weighing daily and call for an overnight weight gain of > 2 pounds or a weekly weight gain of >5 pounds. 

## 2015-04-02 NOTE — Progress Notes (Signed)
Subjective:    Patient ID: Lydia Clark, female    DOB: [birth year], 75 y.o.   MRN: [id number]  Congestive Heart Failure Presents for initial visit. The disease course has been stable. Associated symptoms include fatigue (improving) and shortness of breath. Pertinent negatives include no abdominal pain, chest pain, edema, orthopnea or palpitations. The symptoms have been stable. Past treatments include angiotensin receptor blockers, salt and fluid restriction and oxygen. The treatment provided moderate relief. Compliance with prior treatments has been good. Her past medical history is significant for chronic lung disease, HTN and PE. She has one 1st degree relative with heart disease. Compliance with total regimen is 76-100%. Compliance with diet is 76-100%.  Hypertension This is a chronic problem. The current episode started more than 1 year ago. The problem is unchanged. The problem is controlled. Associated symptoms include shortness of breath. Pertinent negatives include no chest pain, headaches, neck pain, palpitations or peripheral edema. There are no associated agents to hypertension. Risk factors for coronary artery disease include post-menopausal state and sedentary lifestyle. Past treatments include angiotensin blockers, diuretics and lifestyle changes. The current treatment provides moderate improvement. There are no compliance problems.  Hypertensive end-organ damage includes kidney disease and heart failure.    Past Medical History  Diagnosis Date  . Allergy   . Cancer [facility]) 2008    left breast  . Hypertension 2006  . Personal history of tobacco use, presenting hazards to health 2012    30 yrs smoking  . Endocrine disorder 2008    thyroid  . Personal history of malignant neoplasm of breast 2008  . Breast screening, unspecified 2013  . Special screening for malignant neoplasms, colon 2013  . COPD (chronic obstructive pulmonary disease) ([place])   . PE (pulmonary embolism)    patient denies  . Swelling of throat     swelling at base of throat,  . Anxiety     anxiety  . Pneumonia     hx  . Heart murmur     child  . History of kidney stones   . GERD (gastroesophageal reflux disease)   . Arthritis   . Multinodular goiter     followed by Dr. Carloyn Manner @ [facility]  . Dialysis patient ([place]) 2014  . Chronic kidney disease 2014    stage IV chronic   . CHF (congestive heart failure) [facility])     Past Surgical History  Procedure Laterality Date  . Breast surgery Left 2008    lumpectomy  . Fooy Left     lft foot  . Foot surgery  2005  . Splenectomy, total  1956    not sure if partial or total  . Tonsillectomy  1950  . Breast biopsy  2005  . Appendectomy  2005  . Replacement total knee Left 2012    Partial   . Colonoscopy  2007    done in [place]  . Insertion of dialysis catheter Right 07/02/2012    Procedure: INSERTION OF DIALYSIS CATHETER;  Surgeon: Elam Dutch, MD;  Location: [place];  Service: Vascular;  Laterality: Right;  Right Internal Jugular  . Insertion of dialysis catheter Right July 02 2012    right chest/ temporary cath  . Hip arthroplasty Left 01/10/2015    Procedure: ARTHROPLASTY BIPOLAR HIP (HEMIARTHROPLASTY);  Surgeon: Claud Kelp, MD;  Location: [facility];  Service: Orthopedics;  Laterality: Left;  . Peripheral vascular catheterization N/A 01/13/2015    Procedure: Dialysis/Perma Catheter Insertion;  Surgeon: Erskine Squibb  Lucky Cowboy, MD;  Location: [place] [facility];  Service: Cardiovascular;  Laterality: N/A;    Family History  Problem Relation Age of Onset  . Heart attack Father   . Pneumonia Father   . Breast cancer Maternal Aunt   . Breast cancer Paternal Aunt     Social History  Substance Use Topics  . Smoking status: Former Smoker -- 0.30 packs/day for 30 years    Types: Cigarettes    Quit date: 01/10/2015  . Smokeless tobacco: Never Used  . Alcohol Use: No    Allergies  Allergen Reactions  . Bee Venom  Swelling and Other (See Comments)    Breathing problems  . Biaxin [Clarithromycin] Other (See Comments)    Throat swells  . Hydrocodone Nausea And Vomiting  . Mycostatin [Nystatin] Other (See Comments)    Blisters from the ointment  . Polysorbate     Other reaction(s): Unknown  . Sulfa Antibiotics Other (See Comments)    "welps"  . Penicillins Rash    Prior to Admission medications   Medication Sig Start Date End Date Taking? Authorizing Provider  acetaminophen (TYLENOL) 325 MG tablet Take 2 tablets (650 mg total) by mouth every 6 (six) hours as needed for mild pain (or Fever >/= 101). 01/14/15  Yes Dustin Flock, MD  albuterol (PROVENTIL HFA;VENTOLIN HFA) 108 (90 BASE) MCG/ACT inhaler Inhale 2 puffs into the lungs every 6 (six) hours as needed for wheezing or shortness of breath.   Yes Historical Provider, MD  aspirin 81 MG tablet Take 81 mg by mouth daily.   Yes Historical Provider, MD  benzonatate (TESSALON) 100 MG capsule Take 1 capsule (100 mg total) by mouth 3 (three) times daily. Patient taking differently: Take 100 mg by mouth 2 (two) times daily.  03/03/15  Yes Laverle Hobby, MD  Cholecalciferol (VITAMIN D3) 2000 UNITS TABS Take 1 tablet by mouth daily.   Yes Historical Provider, MD  feeding supplement, ENSURE ENLIVE, (ENSURE ENLIVE) LIQD Take 237 mLs by mouth 2 (two) times daily between meals. 01/14/15  Yes Dustin Flock, MD  Fluticasone-Salmeterol (ADVAIR DISKUS) 250-50 MCG/DOSE AEPB Inhale 1 puff into the lungs every 12 (twelve) hours. 03/03/15 03/02/16 Yes Laverle Hobby, MD  furosemide (LASIX) 40 MG tablet Take 40 mg by mouth daily. 12/31/14  Yes Historical Provider, MD  gentamicin ointment (GARAMYCIN) 0.1 % Apply 1 application topically at bedtime.  12/26/13  Yes Historical Provider, MD  mirtazapine (REMERON) 15 MG tablet Take 15 mg by mouth at bedtime. 12/31/14  Yes Historical Provider, MD  omeprazole (PRILOSEC) 20 MG capsule Take 20 mg by mouth daily.   Yes  Historical Provider, MD  rosuvastatin (CRESTOR) 10 MG tablet Take 10 mg by mouth every evening.    Yes Historical Provider, MD  tiotropium (SPIRIVA) 18 MCG inhalation capsule Place 1 capsule (18 mcg total) into inhaler and inhale at bedtime. 03/03/15  Yes Laverle Hobby, MD  valsartan (DIOVAN) 160 MG tablet Take 1 tablet (160 mg total) by mouth 2 (two) times daily. 02/27/15  Yes Demetrios Loll, MD  LORazepam (ATIVAN) 1 MG tablet Take 1 tablet (1 mg total) by mouth at bedtime as needed for anxiety. Patient not taking: Reported on 04/02/2015 01/14/15   Dustin Flock, MD     Review of Systems  Constitutional: Positive for appetite change and fatigue (improving).  HENT: Negative for congestion, postnasal drip and sore throat.   Eyes: Negative.   Respiratory: Positive for chest tightness (under left breast at times when lying down) and  shortness of breath. Negative for cough.   Cardiovascular: Negative for chest pain and palpitations.  Gastrointestinal: Negative for abdominal pain and abdominal distention.  Endocrine: Negative.   Genitourinary: Negative.   Musculoskeletal: Negative for back pain and neck pain.  Skin: Negative.   Allergic/Immunologic: Negative.   Neurological: Positive for light-headedness (on occasion). Negative for dizziness and headaches.  Hematological: Negative for adenopathy. Does not bruise/bleed easily.  Psychiatric/Behavioral: Negative for sleep disturbance (sleeping on 1 pillow with head of bed elevated) and dysphoric mood. The patient is not nervous/anxious.        Objective:   Physical Exam  Constitutional: She is oriented to person, place, and time. She appears well-developed and well-nourished.  HENT:  Head: Normocephalic and atraumatic.  Eyes: Conjunctivae are normal. Pupils are equal, round, and reactive to light.  Neck: Normal range of motion. Neck supple.  Cardiovascular: Normal rate and regular rhythm.   Pulmonary/Chest: Effort normal. She has no  wheezes. She has no rales.  Abdominal: Soft. She exhibits no distension. There is no tenderness.  Musculoskeletal: She exhibits no edema or tenderness.  Neurological: She is alert and oriented to person, place, and time.  Skin: Skin is warm and dry.  Psychiatric: She has a normal mood and affect. Her behavior is normal. Thought content normal.  Nursing note and vitals reviewed.   BP 120/64 mmHg  Pulse 81  Resp 20  Ht 5\' 7"  (1.702 m)  Wt 116 lb (52.617 kg)  BMI 18.16 kg/m2  SpO2 99%       Assessment & Plan:  1: Chronic heart failure with reduced ejection fraction- Patient presents with shortness of breath and fatigue upon exertion. She feels like her energy level is improving and has been receiving physical therapy at home. She does have a walker that she also uses at home. She does wear her oxygen at 2L around the clock except sometimes she will pull it off while she is asleep. She does weigh but hasn't been weighing daily because she was so weak. Discussed the importance of weighing daily and to call for an overnight weight gain of >2 pounds or a weekly weight gain of >5 pounds. Her son says that he can assist with weighing her daily. She does not add any salt to her food and says that her taste buds have changed and "nothing tastes good". She does like broccoli and cheese soup but doesn't eat that often. Discussed the importance of following a 2000mg  sodium diet and written information was given to them about that. She is currently taking valsartan and information was given to them about switching to entresto. They would prefer that Dr. Juleen China manage her medications because they had issues with multiple providers prescribing. They will discuss this with him when they see him next. 2: Chronic kidney disease- Son hooks patient up to peritoneal dialysis on a nightly basis and she reports tolerating that well at this time. 3: Left hip pain- She says that she fell a couple of days ago trying to  get up from her wheelchair and says that she has some pain in her left hip. Discussed that she could have a deep contusion or possibly a small hairline fracture. They will give it a few more days to see how it feels and if it's still hurting next week, they will call Dr. Juleen China.  Return here in 1 month or sooner for any questions/problems before then.

## 2015-04-05 DIAGNOSIS — R531 Weakness: Secondary | ICD-10-CM | POA: Diagnosis not present

## 2015-04-05 DIAGNOSIS — I509 Heart failure, unspecified: Secondary | ICD-10-CM | POA: Diagnosis not present

## 2015-04-05 DIAGNOSIS — J441 Chronic obstructive pulmonary disease with (acute) exacerbation: Secondary | ICD-10-CM | POA: Diagnosis not present

## 2015-04-05 DIAGNOSIS — Z9181 History of falling: Secondary | ICD-10-CM | POA: Diagnosis not present

## 2015-04-05 DIAGNOSIS — J9691 Respiratory failure, unspecified with hypoxia: Secondary | ICD-10-CM | POA: Diagnosis not present

## 2015-04-05 DIAGNOSIS — Z9981 Dependence on supplemental oxygen: Secondary | ICD-10-CM | POA: Diagnosis not present

## 2015-04-06 DIAGNOSIS — Z9981 Dependence on supplemental oxygen: Secondary | ICD-10-CM | POA: Diagnosis not present

## 2015-04-06 DIAGNOSIS — J9691 Respiratory failure, unspecified with hypoxia: Secondary | ICD-10-CM | POA: Diagnosis not present

## 2015-04-06 DIAGNOSIS — R531 Weakness: Secondary | ICD-10-CM | POA: Diagnosis not present

## 2015-04-06 DIAGNOSIS — Z9181 History of falling: Secondary | ICD-10-CM | POA: Diagnosis not present

## 2015-04-06 DIAGNOSIS — I509 Heart failure, unspecified: Secondary | ICD-10-CM | POA: Diagnosis not present

## 2015-04-06 DIAGNOSIS — J441 Chronic obstructive pulmonary disease with (acute) exacerbation: Secondary | ICD-10-CM | POA: Diagnosis not present

## 2015-04-07 DIAGNOSIS — Z992 Dependence on renal dialysis: Secondary | ICD-10-CM | POA: Diagnosis not present

## 2015-04-07 DIAGNOSIS — Z853 Personal history of malignant neoplasm of breast: Secondary | ICD-10-CM | POA: Diagnosis not present

## 2015-04-07 DIAGNOSIS — D509 Iron deficiency anemia, unspecified: Secondary | ICD-10-CM | POA: Diagnosis not present

## 2015-04-07 DIAGNOSIS — Z812 Family history of tobacco abuse and dependence: Secondary | ICD-10-CM | POA: Diagnosis not present

## 2015-04-07 DIAGNOSIS — R627 Adult failure to thrive: Secondary | ICD-10-CM | POA: Diagnosis not present

## 2015-04-07 DIAGNOSIS — N186 End stage renal disease: Secondary | ICD-10-CM | POA: Diagnosis not present

## 2015-04-07 DIAGNOSIS — D631 Anemia in chronic kidney disease: Secondary | ICD-10-CM | POA: Diagnosis not present

## 2015-04-07 DIAGNOSIS — Z9181 History of falling: Secondary | ICD-10-CM | POA: Diagnosis not present

## 2015-04-07 DIAGNOSIS — J9691 Respiratory failure, unspecified with hypoxia: Secondary | ICD-10-CM | POA: Diagnosis not present

## 2015-04-07 DIAGNOSIS — Z9981 Dependence on supplemental oxygen: Secondary | ICD-10-CM | POA: Diagnosis not present

## 2015-04-07 DIAGNOSIS — Z923 Personal history of irradiation: Secondary | ICD-10-CM | POA: Diagnosis not present

## 2015-04-07 DIAGNOSIS — R531 Weakness: Secondary | ICD-10-CM | POA: Diagnosis not present

## 2015-04-07 DIAGNOSIS — I509 Heart failure, unspecified: Secondary | ICD-10-CM | POA: Diagnosis not present

## 2015-04-07 DIAGNOSIS — J441 Chronic obstructive pulmonary disease with (acute) exacerbation: Secondary | ICD-10-CM | POA: Diagnosis not present

## 2015-04-27 ENCOUNTER — Encounter: Payer: Self-pay | Admitting: *Deleted

## 2015-05-03 ENCOUNTER — Encounter: Payer: Self-pay | Admitting: *Deleted

## 2015-05-05 DIAGNOSIS — Z9181 History of falling: Secondary | ICD-10-CM | POA: Diagnosis not present

## 2015-05-05 DIAGNOSIS — Z853 Personal history of malignant neoplasm of breast: Secondary | ICD-10-CM | POA: Diagnosis not present

## 2015-05-05 DIAGNOSIS — Z812 Family history of tobacco abuse and dependence: Secondary | ICD-10-CM | POA: Diagnosis not present

## 2015-05-05 DIAGNOSIS — Z992 Dependence on renal dialysis: Secondary | ICD-10-CM | POA: Diagnosis not present

## 2015-05-05 DIAGNOSIS — Z923 Personal history of irradiation: Secondary | ICD-10-CM | POA: Diagnosis not present

## 2015-05-05 DIAGNOSIS — D509 Iron deficiency anemia, unspecified: Secondary | ICD-10-CM | POA: Diagnosis not present

## 2015-05-05 DIAGNOSIS — J441 Chronic obstructive pulmonary disease with (acute) exacerbation: Secondary | ICD-10-CM | POA: Diagnosis not present

## 2015-05-05 DIAGNOSIS — R627 Adult failure to thrive: Secondary | ICD-10-CM | POA: Diagnosis not present

## 2015-05-05 DIAGNOSIS — R531 Weakness: Secondary | ICD-10-CM | POA: Diagnosis not present

## 2015-05-05 DIAGNOSIS — D631 Anemia in chronic kidney disease: Secondary | ICD-10-CM | POA: Diagnosis not present

## 2015-05-05 DIAGNOSIS — Z9981 Dependence on supplemental oxygen: Secondary | ICD-10-CM | POA: Diagnosis not present

## 2015-05-05 DIAGNOSIS — N186 End stage renal disease: Secondary | ICD-10-CM | POA: Diagnosis not present

## 2015-05-05 DIAGNOSIS — J9691 Respiratory failure, unspecified with hypoxia: Secondary | ICD-10-CM | POA: Diagnosis not present

## 2015-05-05 DIAGNOSIS — I509 Heart failure, unspecified: Secondary | ICD-10-CM | POA: Diagnosis not present

## 2015-05-06 DIAGNOSIS — Z9981 Dependence on supplemental oxygen: Secondary | ICD-10-CM | POA: Diagnosis not present

## 2015-05-06 DIAGNOSIS — Z9181 History of falling: Secondary | ICD-10-CM | POA: Diagnosis not present

## 2015-05-06 DIAGNOSIS — R531 Weakness: Secondary | ICD-10-CM | POA: Diagnosis not present

## 2015-05-06 DIAGNOSIS — N186 End stage renal disease: Secondary | ICD-10-CM | POA: Diagnosis not present

## 2015-05-06 DIAGNOSIS — Z992 Dependence on renal dialysis: Secondary | ICD-10-CM | POA: Diagnosis not present

## 2015-05-06 DIAGNOSIS — D631 Anemia in chronic kidney disease: Secondary | ICD-10-CM | POA: Diagnosis not present

## 2015-05-06 DIAGNOSIS — J441 Chronic obstructive pulmonary disease with (acute) exacerbation: Secondary | ICD-10-CM | POA: Diagnosis not present

## 2015-05-06 DIAGNOSIS — J9691 Respiratory failure, unspecified with hypoxia: Secondary | ICD-10-CM | POA: Diagnosis not present

## 2015-05-06 DIAGNOSIS — D509 Iron deficiency anemia, unspecified: Secondary | ICD-10-CM | POA: Diagnosis not present

## 2015-05-06 DIAGNOSIS — I509 Heart failure, unspecified: Secondary | ICD-10-CM | POA: Diagnosis not present

## 2015-05-07 ENCOUNTER — Ambulatory Visit: Payer: Medicare Other | Attending: Family | Admitting: Family

## 2015-05-07 ENCOUNTER — Encounter: Payer: Self-pay | Admitting: Family

## 2015-05-07 VITALS — BP 149/82 | HR 79 | Resp 18 | Ht 67.0 in | Wt 130.0 lb

## 2015-05-07 DIAGNOSIS — Z880 Allergy status to penicillin: Secondary | ICD-10-CM | POA: Insufficient documentation

## 2015-05-07 DIAGNOSIS — Z86711 Personal history of pulmonary embolism: Secondary | ICD-10-CM | POA: Insufficient documentation

## 2015-05-07 DIAGNOSIS — Z992 Dependence on renal dialysis: Secondary | ICD-10-CM | POA: Insufficient documentation

## 2015-05-07 DIAGNOSIS — Z885 Allergy status to narcotic agent status: Secondary | ICD-10-CM | POA: Diagnosis not present

## 2015-05-07 DIAGNOSIS — J449 Chronic obstructive pulmonary disease, unspecified: Secondary | ICD-10-CM

## 2015-05-07 DIAGNOSIS — R531 Weakness: Secondary | ICD-10-CM | POA: Diagnosis not present

## 2015-05-07 DIAGNOSIS — Z9181 History of falling: Secondary | ICD-10-CM | POA: Diagnosis not present

## 2015-05-07 DIAGNOSIS — I509 Heart failure, unspecified: Secondary | ICD-10-CM | POA: Diagnosis not present

## 2015-05-07 DIAGNOSIS — Z87891 Personal history of nicotine dependence: Secondary | ICD-10-CM | POA: Insufficient documentation

## 2015-05-07 DIAGNOSIS — Z87442 Personal history of urinary calculi: Secondary | ICD-10-CM | POA: Insufficient documentation

## 2015-05-07 DIAGNOSIS — J9691 Respiratory failure, unspecified with hypoxia: Secondary | ICD-10-CM | POA: Diagnosis not present

## 2015-05-07 DIAGNOSIS — N186 End stage renal disease: Secondary | ICD-10-CM

## 2015-05-07 DIAGNOSIS — I5022 Chronic systolic (congestive) heart failure: Secondary | ICD-10-CM | POA: Diagnosis not present

## 2015-05-07 DIAGNOSIS — D631 Anemia in chronic kidney disease: Secondary | ICD-10-CM | POA: Diagnosis not present

## 2015-05-07 DIAGNOSIS — M1990 Unspecified osteoarthritis, unspecified site: Secondary | ICD-10-CM | POA: Diagnosis not present

## 2015-05-07 DIAGNOSIS — Z882 Allergy status to sulfonamides status: Secondary | ICD-10-CM | POA: Diagnosis not present

## 2015-05-07 DIAGNOSIS — Z9889 Other specified postprocedural states: Secondary | ICD-10-CM | POA: Diagnosis not present

## 2015-05-07 DIAGNOSIS — Z853 Personal history of malignant neoplasm of breast: Secondary | ICD-10-CM | POA: Diagnosis not present

## 2015-05-07 DIAGNOSIS — Z79899 Other long term (current) drug therapy: Secondary | ICD-10-CM | POA: Insufficient documentation

## 2015-05-07 DIAGNOSIS — Z7982 Long term (current) use of aspirin: Secondary | ICD-10-CM | POA: Diagnosis not present

## 2015-05-07 DIAGNOSIS — N189 Chronic kidney disease, unspecified: Secondary | ICD-10-CM | POA: Diagnosis not present

## 2015-05-07 DIAGNOSIS — Z9981 Dependence on supplemental oxygen: Secondary | ICD-10-CM | POA: Diagnosis not present

## 2015-05-07 DIAGNOSIS — D509 Iron deficiency anemia, unspecified: Secondary | ICD-10-CM | POA: Diagnosis not present

## 2015-05-07 DIAGNOSIS — I129 Hypertensive chronic kidney disease with stage 1 through stage 4 chronic kidney disease, or unspecified chronic kidney disease: Secondary | ICD-10-CM | POA: Insufficient documentation

## 2015-05-07 DIAGNOSIS — K219 Gastro-esophageal reflux disease without esophagitis: Secondary | ICD-10-CM | POA: Diagnosis not present

## 2015-05-07 DIAGNOSIS — J441 Chronic obstructive pulmonary disease with (acute) exacerbation: Secondary | ICD-10-CM | POA: Diagnosis not present

## 2015-05-07 NOTE — Progress Notes (Signed)
Subjective:    Patient ID: Lydia Clark, female    DOB: 1940/07/06, 75 y.o.   MRN: [id number]  Congestive Heart Failure Presents for follow-up visit. The disease course has been worsening. Associated symptoms include fatigue and shortness of breath (today ). Pertinent negatives include no abdominal pain, chest pain, edema, orthopnea or palpitations. The symptoms have been worsening. Past treatments include angiotensin receptor blockers, salt and fluid restriction and oxygen. The treatment provided moderate relief. Compliance with prior treatments has been good. Her past medical history is significant for chronic lung disease, HTN and PE. There is no history of CVA or DM. She has one 1st degree relative with heart disease. Compliance with total regimen is 76-100%.  Shortness of Breath This is a recurrent problem. The current episode started yesterday. The problem occurs intermittently. The problem has been unchanged. Associated symptoms include rhinorrhea. Pertinent negatives include no abdominal pain, chest pain, fever, leg swelling, neck pain, sore throat, vomiting or wheezing. The symptoms are aggravated by any activity. She has tried steroid inhalers and beta agonist inhalers for the symptoms. The treatment provided moderate relief. Her past medical history is significant for chronic lung disease, a heart failure and PE.    Past Medical History  Diagnosis Date  . Allergy   . 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 Hypertension 2006  . Personal history of tobacco use, presenting hazards to health 2012    30 yrs smoking  . Endocrine disorder 2008    thyroid  . Personal history of malignant neoplasm of breast 2008  . Breast screening, unspecified 2013  . Special screening for malignant neoplasms, colon 2013  . COPD (chronic obstructive pulmonary disease) ([place])   . PE (pulmonary embolism)     patient denies  . Swelling of throat     swelling at base of throat,  . Anxiety     anxiety  .  Pneumonia     hx  . Heart murmur     child  . History of kidney stones   . GERD (gastroesophageal reflux disease)   . Arthritis   . Multinodular goiter     followed by Dr. Carloyn Manner @ [facility]  . Dialysis patient ([place]) 2014  . Chronic kidney disease 2014    stage IV chronic   . CHF (congestive heart failure) [facility])     Past Surgical History  Procedure Laterality Date  . Breast surgery Left 2008    lumpectomy  . Fooy Left     lft foot  . Foot surgery  2005  . Splenectomy, total  1956    not sure if partial or total  . Tonsillectomy  1950  . Breast biopsy  2005  . Appendectomy  2005  . Replacement total knee Left 2012    Partial   . Colonoscopy  2007    done in [place]  . Insertion of dialysis catheter Right 07/02/2012    Procedure: INSERTION OF DIALYSIS CATHETER;  Surgeon: Elam Dutch, MD;  Location: [place];  Service: Vascular;  Laterality: Right;  Right Internal Jugular  . Insertion of dialysis catheter Right July 02 2012    right chest/ temporary cath  . Hip arthroplasty Left 01/10/2015    Procedure: ARTHROPLASTY BIPOLAR HIP (HEMIARTHROPLASTY);  Surgeon: Claud Kelp, MD;  Location: [facility];  Service: Orthopedics;  Laterality: Left;  . Peripheral vascular catheterization N/A 01/13/2015    Procedure: Dialysis/Perma Catheter Insertion;  Surgeon: Algernon Huxley, MD;  Location: [place] [facility];  Service: Cardiovascular;  Laterality: N/A;    Family History  Problem Relation Age of Onset  . Heart attack Father   . Pneumonia Father   . Breast cancer Maternal Aunt   . Breast cancer Paternal Aunt     Social History  Substance Use Topics  . Smoking status: Former Smoker -- 0.30 packs/day for 30 years    Types: Cigarettes    Quit date: 01/10/2015  . Smokeless tobacco: Never Used  . Alcohol Use: No    Allergies  Allergen Reactions  . Bee Venom Swelling and Other (See Comments)    Breathing problems  . Biaxin [Clarithromycin] Other (See Comments)     Throat swells  . Hydrocodone Nausea And Vomiting  . Mycostatin [Nystatin] Other (See Comments)    Blisters from the ointment  . Polysorbate     Other reaction(s): Unknown  . Sulfa Antibiotics Other (See Comments)    "welps"  . Penicillins Rash    Prior to Admission medications   Medication Sig Start Date End Date Taking? Authorizing Provider  acetaminophen (TYLENOL) 325 MG tablet Take 2 tablets (650 mg total) by mouth every 6 (six) hours as needed for mild pain (or Fever >/= 101). 01/14/15  Yes Dustin Flock, MD  albuterol (PROVENTIL HFA;VENTOLIN HFA) 108 (90 BASE) MCG/ACT inhaler Inhale 2 puffs into the lungs every 6 (six) hours as needed for wheezing or shortness of breath.   Yes Historical Provider, MD  aspirin 81 MG tablet Take 81 mg by mouth daily.   Yes Historical Provider, MD  Cholecalciferol (VITAMIN D3) 2000 UNITS TABS Take 1 tablet by mouth daily.   Yes Historical Provider, MD  feeding supplement, ENSURE ENLIVE, (ENSURE ENLIVE) LIQD Take 237 mLs by mouth 2 (two) times daily between meals. 01/14/15  Yes Dustin Flock, MD  Fluticasone-Salmeterol (ADVAIR DISKUS) 250-50 MCG/DOSE AEPB Inhale 1 puff into the lungs every 12 (twelve) hours. 03/03/15 03/02/16 Yes Laverle Hobby, MD  furosemide (LASIX) 40 MG tablet Take 40 mg by mouth daily. 12/31/14  Yes Historical Provider, MD  gentamicin ointment (GARAMYCIN) 0.1 % Apply 1 application topically at bedtime.  12/26/13  Yes Historical Provider, MD  LORazepam (ATIVAN) 1 MG tablet Take 1 tablet (1 mg total) by mouth at bedtime as needed for anxiety. 01/14/15  Yes Dustin Flock, MD  omeprazole (PRILOSEC) 20 MG capsule Take 20 mg by mouth daily.   Yes Historical Provider, MD  rosuvastatin (CRESTOR) 10 MG tablet Take 10 mg by mouth every evening.    Yes Historical Provider, MD  tiotropium (SPIRIVA) 18 MCG inhalation capsule Place 1 capsule (18 mcg total) into inhaler and inhale at bedtime. 03/03/15  Yes Laverle Hobby, MD   valsartan (DIOVAN) 160 MG tablet Take 1 tablet (160 mg total) by mouth 2 (two) times daily. 02/27/15  Yes Demetrios Loll, MD     Review of Systems  Constitutional: Positive for appetite change (decreased) and fatigue. Negative for fever.  HENT: Positive for rhinorrhea. Negative for congestion and sore throat.   Eyes: Negative.   Respiratory: Positive for shortness of breath (today ). Negative for cough, chest tightness and wheezing.   Cardiovascular: Negative for chest pain, palpitations and leg swelling.  Gastrointestinal: Positive for nausea and abdominal distention (tightness across the abdomen). Negative for vomiting and abdominal pain.  Endocrine: Negative.   Genitourinary: Negative.   Musculoskeletal: Negative for back pain and neck pain.  Skin: Negative.   Allergic/Immunologic: Negative.   Neurological: Positive for weakness (legs).  Negative for dizziness and light-headedness.  Hematological: Negative for adenopathy. Does not bruise/bleed easily.  Psychiatric/Behavioral: Positive for sleep disturbance (waking up at night) and dysphoric mood. The patient is not nervous/anxious.        Objective:   Physical Exam  Constitutional: She is oriented to person, place, and time. She appears well-developed and well-nourished.  HENT:  Head: Normocephalic and atraumatic.  Eyes: Conjunctivae are normal. Pupils are equal, round, and reactive to light.  Neck: Normal range of motion. Neck supple.  Cardiovascular: Normal rate and regular rhythm.   Pulmonary/Chest: Effort normal and breath sounds normal. She has no wheezes. She has no rales.  Abdominal: Soft. She exhibits distension. There is no tenderness.  Musculoskeletal: She exhibits no edema or tenderness.  Neurological: She is alert and oriented to person, place, and time.  Skin: Skin is warm and dry.  Psychiatric: She has a normal mood and affect. Her behavior is normal. Thought content normal.  Nursing note and vitals reviewed.   BP  149/82 mmHg  Pulse 79  Resp 18  Ht 5\' 7"  (1.702 m)  Wt 130 lb (58.968 kg)  BMI 20.36 kg/m2  SpO2 98%       Assessment & Plan:  1: Chronic heart failure with reduced ejection fraction- Patient presents with worsening shortness of breath with exertion which got worse as of today. Both she and her son feel like her breathing is a little worse because her home dialysis didn't pull as much fluid off. They've already been in touch with the nurse who confirms that is probably why her breathing is worse. Up until today, she was feeling very good and was moving a little without assistance. Doesn't feel well today. Continues to weigh daily and her home weight is up as well. By our scale, she's gained 14 pounds since she was here last. Is not adding salt to her food and is trying to follow a low sodium diet. No swelling in her legs. [facility] Pharm[facility] went in and reviewed medications with the patient.  2: Chronic kidney disease- Again, she does nightly home dialysis and the nurse says that probably around 2 cups of fluid didn't get pulled off. The nurse told her son to see how she does tonight. Follows closely with Dr. Rolly Salter and had an appointment with him on 04/28/15. Is planning on calling him if the dialysis doesn't pull enough off tonight. 3: COPD- No wheezing heard at this time. Continues to use her inhalers.  Return here in 3 months or sooner for any questions/problems before then.

## 2015-05-07 NOTE — Patient Instructions (Signed)
Continue weighing daily and call for an overnight weight gain of > 2 pounds or a weekly weight gain of >5 pounds. 

## 2015-05-08 DIAGNOSIS — Z992 Dependence on renal dialysis: Secondary | ICD-10-CM | POA: Diagnosis not present

## 2015-05-08 DIAGNOSIS — D509 Iron deficiency anemia, unspecified: Secondary | ICD-10-CM | POA: Diagnosis not present

## 2015-05-08 DIAGNOSIS — N186 End stage renal disease: Secondary | ICD-10-CM | POA: Diagnosis not present

## 2015-05-08 DIAGNOSIS — D631 Anemia in chronic kidney disease: Secondary | ICD-10-CM | POA: Diagnosis not present

## 2015-05-09 DIAGNOSIS — D509 Iron deficiency anemia, unspecified: Secondary | ICD-10-CM | POA: Diagnosis not present

## 2015-05-09 DIAGNOSIS — Z992 Dependence on renal dialysis: Secondary | ICD-10-CM | POA: Diagnosis not present

## 2015-05-09 DIAGNOSIS — N186 End stage renal disease: Secondary | ICD-10-CM | POA: Diagnosis not present

## 2015-05-09 DIAGNOSIS — D631 Anemia in chronic kidney disease: Secondary | ICD-10-CM | POA: Diagnosis not present

## 2015-05-10 DIAGNOSIS — Z9181 History of falling: Secondary | ICD-10-CM | POA: Diagnosis not present

## 2015-05-10 DIAGNOSIS — N186 End stage renal disease: Secondary | ICD-10-CM | POA: Diagnosis not present

## 2015-05-10 DIAGNOSIS — J441 Chronic obstructive pulmonary disease with (acute) exacerbation: Secondary | ICD-10-CM | POA: Diagnosis not present

## 2015-05-10 DIAGNOSIS — R531 Weakness: Secondary | ICD-10-CM | POA: Diagnosis not present

## 2015-05-10 DIAGNOSIS — Z992 Dependence on renal dialysis: Secondary | ICD-10-CM | POA: Diagnosis not present

## 2015-05-10 DIAGNOSIS — J9691 Respiratory failure, unspecified with hypoxia: Secondary | ICD-10-CM | POA: Diagnosis not present

## 2015-05-10 DIAGNOSIS — Z9981 Dependence on supplemental oxygen: Secondary | ICD-10-CM | POA: Diagnosis not present

## 2015-05-10 DIAGNOSIS — I509 Heart failure, unspecified: Secondary | ICD-10-CM | POA: Diagnosis not present

## 2015-05-10 DIAGNOSIS — D631 Anemia in chronic kidney disease: Secondary | ICD-10-CM | POA: Diagnosis not present

## 2015-05-10 DIAGNOSIS — D509 Iron deficiency anemia, unspecified: Secondary | ICD-10-CM | POA: Diagnosis not present

## 2015-05-11 DIAGNOSIS — J9691 Respiratory failure, unspecified with hypoxia: Secondary | ICD-10-CM | POA: Diagnosis not present

## 2015-05-11 DIAGNOSIS — J441 Chronic obstructive pulmonary disease with (acute) exacerbation: Secondary | ICD-10-CM | POA: Diagnosis not present

## 2015-05-11 DIAGNOSIS — R531 Weakness: Secondary | ICD-10-CM | POA: Diagnosis not present

## 2015-05-11 DIAGNOSIS — D631 Anemia in chronic kidney disease: Secondary | ICD-10-CM | POA: Diagnosis not present

## 2015-05-11 DIAGNOSIS — Z9181 History of falling: Secondary | ICD-10-CM | POA: Diagnosis not present

## 2015-05-11 DIAGNOSIS — Z9981 Dependence on supplemental oxygen: Secondary | ICD-10-CM | POA: Diagnosis not present

## 2015-05-11 DIAGNOSIS — Z992 Dependence on renal dialysis: Secondary | ICD-10-CM | POA: Diagnosis not present

## 2015-05-11 DIAGNOSIS — N186 End stage renal disease: Secondary | ICD-10-CM | POA: Diagnosis not present

## 2015-05-11 DIAGNOSIS — D509 Iron deficiency anemia, unspecified: Secondary | ICD-10-CM | POA: Diagnosis not present

## 2015-05-11 DIAGNOSIS — I509 Heart failure, unspecified: Secondary | ICD-10-CM | POA: Diagnosis not present

## 2015-05-12 DIAGNOSIS — D631 Anemia in chronic kidney disease: Secondary | ICD-10-CM | POA: Diagnosis not present

## 2015-05-12 DIAGNOSIS — Z9981 Dependence on supplemental oxygen: Secondary | ICD-10-CM | POA: Diagnosis not present

## 2015-05-12 DIAGNOSIS — I509 Heart failure, unspecified: Secondary | ICD-10-CM | POA: Diagnosis not present

## 2015-05-12 DIAGNOSIS — N186 End stage renal disease: Secondary | ICD-10-CM | POA: Diagnosis not present

## 2015-05-12 DIAGNOSIS — Z992 Dependence on renal dialysis: Secondary | ICD-10-CM | POA: Diagnosis not present

## 2015-05-12 DIAGNOSIS — J9691 Respiratory failure, unspecified with hypoxia: Secondary | ICD-10-CM | POA: Diagnosis not present

## 2015-05-12 DIAGNOSIS — J441 Chronic obstructive pulmonary disease with (acute) exacerbation: Secondary | ICD-10-CM | POA: Diagnosis not present

## 2015-05-12 DIAGNOSIS — Z9181 History of falling: Secondary | ICD-10-CM | POA: Diagnosis not present

## 2015-05-12 DIAGNOSIS — R531 Weakness: Secondary | ICD-10-CM | POA: Diagnosis not present

## 2015-05-12 DIAGNOSIS — D509 Iron deficiency anemia, unspecified: Secondary | ICD-10-CM | POA: Diagnosis not present

## 2015-05-13 DIAGNOSIS — D509 Iron deficiency anemia, unspecified: Secondary | ICD-10-CM | POA: Diagnosis not present

## 2015-05-13 DIAGNOSIS — N186 End stage renal disease: Secondary | ICD-10-CM | POA: Diagnosis not present

## 2015-05-13 DIAGNOSIS — Z992 Dependence on renal dialysis: Secondary | ICD-10-CM | POA: Diagnosis not present

## 2015-05-13 DIAGNOSIS — D631 Anemia in chronic kidney disease: Secondary | ICD-10-CM | POA: Diagnosis not present

## 2015-05-14 DIAGNOSIS — R531 Weakness: Secondary | ICD-10-CM | POA: Diagnosis not present

## 2015-05-14 DIAGNOSIS — N186 End stage renal disease: Secondary | ICD-10-CM | POA: Diagnosis not present

## 2015-05-14 DIAGNOSIS — D509 Iron deficiency anemia, unspecified: Secondary | ICD-10-CM | POA: Diagnosis not present

## 2015-05-14 DIAGNOSIS — Z9181 History of falling: Secondary | ICD-10-CM | POA: Diagnosis not present

## 2015-05-14 DIAGNOSIS — J9691 Respiratory failure, unspecified with hypoxia: Secondary | ICD-10-CM | POA: Diagnosis not present

## 2015-05-14 DIAGNOSIS — Z9981 Dependence on supplemental oxygen: Secondary | ICD-10-CM | POA: Diagnosis not present

## 2015-05-14 DIAGNOSIS — Z992 Dependence on renal dialysis: Secondary | ICD-10-CM | POA: Diagnosis not present

## 2015-05-14 DIAGNOSIS — D631 Anemia in chronic kidney disease: Secondary | ICD-10-CM | POA: Diagnosis not present

## 2015-05-14 DIAGNOSIS — I509 Heart failure, unspecified: Secondary | ICD-10-CM | POA: Diagnosis not present

## 2015-05-14 DIAGNOSIS — J441 Chronic obstructive pulmonary disease with (acute) exacerbation: Secondary | ICD-10-CM | POA: Diagnosis not present

## 2015-05-15 DIAGNOSIS — N186 End stage renal disease: Secondary | ICD-10-CM | POA: Diagnosis not present

## 2015-05-15 DIAGNOSIS — D631 Anemia in chronic kidney disease: Secondary | ICD-10-CM | POA: Diagnosis not present

## 2015-05-15 DIAGNOSIS — D509 Iron deficiency anemia, unspecified: Secondary | ICD-10-CM | POA: Diagnosis not present

## 2015-05-15 DIAGNOSIS — Z992 Dependence on renal dialysis: Secondary | ICD-10-CM | POA: Diagnosis not present

## 2015-05-16 DIAGNOSIS — D631 Anemia in chronic kidney disease: Secondary | ICD-10-CM | POA: Diagnosis not present

## 2015-05-16 DIAGNOSIS — D509 Iron deficiency anemia, unspecified: Secondary | ICD-10-CM | POA: Diagnosis not present

## 2015-05-16 DIAGNOSIS — Z992 Dependence on renal dialysis: Secondary | ICD-10-CM | POA: Diagnosis not present

## 2015-05-16 DIAGNOSIS — N186 End stage renal disease: Secondary | ICD-10-CM | POA: Diagnosis not present

## 2015-05-17 DIAGNOSIS — D509 Iron deficiency anemia, unspecified: Secondary | ICD-10-CM | POA: Diagnosis not present

## 2015-05-17 DIAGNOSIS — N186 End stage renal disease: Secondary | ICD-10-CM | POA: Diagnosis not present

## 2015-05-17 DIAGNOSIS — I509 Heart failure, unspecified: Secondary | ICD-10-CM | POA: Diagnosis not present

## 2015-05-17 DIAGNOSIS — R531 Weakness: Secondary | ICD-10-CM | POA: Diagnosis not present

## 2015-05-17 DIAGNOSIS — Z9181 History of falling: Secondary | ICD-10-CM | POA: Diagnosis not present

## 2015-05-17 DIAGNOSIS — J9691 Respiratory failure, unspecified with hypoxia: Secondary | ICD-10-CM | POA: Diagnosis not present

## 2015-05-17 DIAGNOSIS — Z992 Dependence on renal dialysis: Secondary | ICD-10-CM | POA: Diagnosis not present

## 2015-05-17 DIAGNOSIS — J441 Chronic obstructive pulmonary disease with (acute) exacerbation: Secondary | ICD-10-CM | POA: Diagnosis not present

## 2015-05-17 DIAGNOSIS — D631 Anemia in chronic kidney disease: Secondary | ICD-10-CM | POA: Diagnosis not present

## 2015-05-17 DIAGNOSIS — Z9981 Dependence on supplemental oxygen: Secondary | ICD-10-CM | POA: Diagnosis not present

## 2015-05-18 DIAGNOSIS — D631 Anemia in chronic kidney disease: Secondary | ICD-10-CM | POA: Diagnosis not present

## 2015-05-18 DIAGNOSIS — R531 Weakness: Secondary | ICD-10-CM | POA: Diagnosis not present

## 2015-05-18 DIAGNOSIS — J9691 Respiratory failure, unspecified with hypoxia: Secondary | ICD-10-CM | POA: Diagnosis not present

## 2015-05-18 DIAGNOSIS — Z9981 Dependence on supplemental oxygen: Secondary | ICD-10-CM | POA: Diagnosis not present

## 2015-05-18 DIAGNOSIS — I509 Heart failure, unspecified: Secondary | ICD-10-CM | POA: Diagnosis not present

## 2015-05-18 DIAGNOSIS — N186 End stage renal disease: Secondary | ICD-10-CM | POA: Diagnosis not present

## 2015-05-18 DIAGNOSIS — D509 Iron deficiency anemia, unspecified: Secondary | ICD-10-CM | POA: Diagnosis not present

## 2015-05-18 DIAGNOSIS — J441 Chronic obstructive pulmonary disease with (acute) exacerbation: Secondary | ICD-10-CM | POA: Diagnosis not present

## 2015-05-18 DIAGNOSIS — Z992 Dependence on renal dialysis: Secondary | ICD-10-CM | POA: Diagnosis not present

## 2015-05-18 DIAGNOSIS — Z9181 History of falling: Secondary | ICD-10-CM | POA: Diagnosis not present

## 2015-05-19 DIAGNOSIS — R531 Weakness: Secondary | ICD-10-CM | POA: Diagnosis not present

## 2015-05-19 DIAGNOSIS — N186 End stage renal disease: Secondary | ICD-10-CM | POA: Diagnosis not present

## 2015-05-19 DIAGNOSIS — D509 Iron deficiency anemia, unspecified: Secondary | ICD-10-CM | POA: Diagnosis not present

## 2015-05-19 DIAGNOSIS — J9691 Respiratory failure, unspecified with hypoxia: Secondary | ICD-10-CM | POA: Diagnosis not present

## 2015-05-19 DIAGNOSIS — Z9981 Dependence on supplemental oxygen: Secondary | ICD-10-CM | POA: Diagnosis not present

## 2015-05-19 DIAGNOSIS — Z992 Dependence on renal dialysis: Secondary | ICD-10-CM | POA: Diagnosis not present

## 2015-05-19 DIAGNOSIS — J441 Chronic obstructive pulmonary disease with (acute) exacerbation: Secondary | ICD-10-CM | POA: Diagnosis not present

## 2015-05-19 DIAGNOSIS — I509 Heart failure, unspecified: Secondary | ICD-10-CM | POA: Diagnosis not present

## 2015-05-19 DIAGNOSIS — D631 Anemia in chronic kidney disease: Secondary | ICD-10-CM | POA: Diagnosis not present

## 2015-05-19 DIAGNOSIS — Z9181 History of falling: Secondary | ICD-10-CM | POA: Diagnosis not present

## 2015-05-20 DIAGNOSIS — D631 Anemia in chronic kidney disease: Secondary | ICD-10-CM | POA: Diagnosis not present

## 2015-05-20 DIAGNOSIS — Z992 Dependence on renal dialysis: Secondary | ICD-10-CM | POA: Diagnosis not present

## 2015-05-20 DIAGNOSIS — D509 Iron deficiency anemia, unspecified: Secondary | ICD-10-CM | POA: Diagnosis not present

## 2015-05-20 DIAGNOSIS — N186 End stage renal disease: Secondary | ICD-10-CM | POA: Diagnosis not present

## 2015-05-21 DIAGNOSIS — Z9181 History of falling: Secondary | ICD-10-CM | POA: Diagnosis not present

## 2015-05-21 DIAGNOSIS — R531 Weakness: Secondary | ICD-10-CM | POA: Diagnosis not present

## 2015-05-21 DIAGNOSIS — D631 Anemia in chronic kidney disease: Secondary | ICD-10-CM | POA: Diagnosis not present

## 2015-05-21 DIAGNOSIS — Z9981 Dependence on supplemental oxygen: Secondary | ICD-10-CM | POA: Diagnosis not present

## 2015-05-21 DIAGNOSIS — I509 Heart failure, unspecified: Secondary | ICD-10-CM | POA: Diagnosis not present

## 2015-05-21 DIAGNOSIS — N186 End stage renal disease: Secondary | ICD-10-CM | POA: Diagnosis not present

## 2015-05-21 DIAGNOSIS — D509 Iron deficiency anemia, unspecified: Secondary | ICD-10-CM | POA: Diagnosis not present

## 2015-05-21 DIAGNOSIS — J441 Chronic obstructive pulmonary disease with (acute) exacerbation: Secondary | ICD-10-CM | POA: Diagnosis not present

## 2015-05-21 DIAGNOSIS — J9691 Respiratory failure, unspecified with hypoxia: Secondary | ICD-10-CM | POA: Diagnosis not present

## 2015-05-21 DIAGNOSIS — Z992 Dependence on renal dialysis: Secondary | ICD-10-CM | POA: Diagnosis not present

## 2015-05-22 DIAGNOSIS — Z992 Dependence on renal dialysis: Secondary | ICD-10-CM | POA: Diagnosis not present

## 2015-05-22 DIAGNOSIS — N186 End stage renal disease: Secondary | ICD-10-CM | POA: Diagnosis not present

## 2015-05-22 DIAGNOSIS — D631 Anemia in chronic kidney disease: Secondary | ICD-10-CM | POA: Diagnosis not present

## 2015-05-22 DIAGNOSIS — D509 Iron deficiency anemia, unspecified: Secondary | ICD-10-CM | POA: Diagnosis not present

## 2015-05-23 DIAGNOSIS — D509 Iron deficiency anemia, unspecified: Secondary | ICD-10-CM | POA: Diagnosis not present

## 2015-05-23 DIAGNOSIS — Z992 Dependence on renal dialysis: Secondary | ICD-10-CM | POA: Diagnosis not present

## 2015-05-23 DIAGNOSIS — D631 Anemia in chronic kidney disease: Secondary | ICD-10-CM | POA: Diagnosis not present

## 2015-05-23 DIAGNOSIS — N186 End stage renal disease: Secondary | ICD-10-CM | POA: Diagnosis not present

## 2015-05-24 DIAGNOSIS — Z9181 History of falling: Secondary | ICD-10-CM | POA: Diagnosis not present

## 2015-05-24 DIAGNOSIS — J441 Chronic obstructive pulmonary disease with (acute) exacerbation: Secondary | ICD-10-CM | POA: Diagnosis not present

## 2015-05-24 DIAGNOSIS — R531 Weakness: Secondary | ICD-10-CM | POA: Diagnosis not present

## 2015-05-24 DIAGNOSIS — J9691 Respiratory failure, unspecified with hypoxia: Secondary | ICD-10-CM | POA: Diagnosis not present

## 2015-05-24 DIAGNOSIS — Z9981 Dependence on supplemental oxygen: Secondary | ICD-10-CM | POA: Diagnosis not present

## 2015-05-24 DIAGNOSIS — D631 Anemia in chronic kidney disease: Secondary | ICD-10-CM | POA: Diagnosis not present

## 2015-05-24 DIAGNOSIS — I509 Heart failure, unspecified: Secondary | ICD-10-CM | POA: Diagnosis not present

## 2015-05-24 DIAGNOSIS — N186 End stage renal disease: Secondary | ICD-10-CM | POA: Diagnosis not present

## 2015-05-24 DIAGNOSIS — Z992 Dependence on renal dialysis: Secondary | ICD-10-CM | POA: Diagnosis not present

## 2015-05-24 DIAGNOSIS — D509 Iron deficiency anemia, unspecified: Secondary | ICD-10-CM | POA: Diagnosis not present

## 2015-05-25 DIAGNOSIS — D509 Iron deficiency anemia, unspecified: Secondary | ICD-10-CM | POA: Diagnosis not present

## 2015-05-25 DIAGNOSIS — D631 Anemia in chronic kidney disease: Secondary | ICD-10-CM | POA: Diagnosis not present

## 2015-05-25 DIAGNOSIS — J441 Chronic obstructive pulmonary disease with (acute) exacerbation: Secondary | ICD-10-CM | POA: Diagnosis not present

## 2015-05-25 DIAGNOSIS — Z992 Dependence on renal dialysis: Secondary | ICD-10-CM | POA: Diagnosis not present

## 2015-05-25 DIAGNOSIS — N186 End stage renal disease: Secondary | ICD-10-CM | POA: Diagnosis not present

## 2015-05-25 DIAGNOSIS — J9691 Respiratory failure, unspecified with hypoxia: Secondary | ICD-10-CM | POA: Diagnosis not present

## 2015-05-25 DIAGNOSIS — I509 Heart failure, unspecified: Secondary | ICD-10-CM | POA: Diagnosis not present

## 2015-05-25 DIAGNOSIS — R531 Weakness: Secondary | ICD-10-CM | POA: Diagnosis not present

## 2015-05-25 DIAGNOSIS — Z9981 Dependence on supplemental oxygen: Secondary | ICD-10-CM | POA: Diagnosis not present

## 2015-05-25 DIAGNOSIS — Z9181 History of falling: Secondary | ICD-10-CM | POA: Diagnosis not present

## 2015-05-26 DIAGNOSIS — N186 End stage renal disease: Secondary | ICD-10-CM | POA: Diagnosis not present

## 2015-05-26 DIAGNOSIS — D631 Anemia in chronic kidney disease: Secondary | ICD-10-CM | POA: Diagnosis not present

## 2015-05-26 DIAGNOSIS — Z9181 History of falling: Secondary | ICD-10-CM | POA: Diagnosis not present

## 2015-05-26 DIAGNOSIS — I509 Heart failure, unspecified: Secondary | ICD-10-CM | POA: Diagnosis not present

## 2015-05-26 DIAGNOSIS — Z992 Dependence on renal dialysis: Secondary | ICD-10-CM | POA: Diagnosis not present

## 2015-05-26 DIAGNOSIS — D509 Iron deficiency anemia, unspecified: Secondary | ICD-10-CM | POA: Diagnosis not present

## 2015-05-26 DIAGNOSIS — Z9981 Dependence on supplemental oxygen: Secondary | ICD-10-CM | POA: Diagnosis not present

## 2015-05-26 DIAGNOSIS — R531 Weakness: Secondary | ICD-10-CM | POA: Diagnosis not present

## 2015-05-26 DIAGNOSIS — J9691 Respiratory failure, unspecified with hypoxia: Secondary | ICD-10-CM | POA: Diagnosis not present

## 2015-05-26 DIAGNOSIS — J441 Chronic obstructive pulmonary disease with (acute) exacerbation: Secondary | ICD-10-CM | POA: Diagnosis not present

## 2015-05-27 DIAGNOSIS — D509 Iron deficiency anemia, unspecified: Secondary | ICD-10-CM | POA: Diagnosis not present

## 2015-05-27 DIAGNOSIS — D631 Anemia in chronic kidney disease: Secondary | ICD-10-CM | POA: Diagnosis not present

## 2015-05-27 DIAGNOSIS — Z992 Dependence on renal dialysis: Secondary | ICD-10-CM | POA: Diagnosis not present

## 2015-05-27 DIAGNOSIS — N186 End stage renal disease: Secondary | ICD-10-CM | POA: Diagnosis not present

## 2015-05-28 DIAGNOSIS — Z9181 History of falling: Secondary | ICD-10-CM | POA: Diagnosis not present

## 2015-05-28 DIAGNOSIS — R531 Weakness: Secondary | ICD-10-CM | POA: Diagnosis not present

## 2015-05-28 DIAGNOSIS — D631 Anemia in chronic kidney disease: Secondary | ICD-10-CM | POA: Diagnosis not present

## 2015-05-28 DIAGNOSIS — J9691 Respiratory failure, unspecified with hypoxia: Secondary | ICD-10-CM | POA: Diagnosis not present

## 2015-05-28 DIAGNOSIS — I509 Heart failure, unspecified: Secondary | ICD-10-CM | POA: Diagnosis not present

## 2015-05-28 DIAGNOSIS — N186 End stage renal disease: Secondary | ICD-10-CM | POA: Diagnosis not present

## 2015-05-28 DIAGNOSIS — Z9981 Dependence on supplemental oxygen: Secondary | ICD-10-CM | POA: Diagnosis not present

## 2015-05-28 DIAGNOSIS — Z992 Dependence on renal dialysis: Secondary | ICD-10-CM | POA: Diagnosis not present

## 2015-05-28 DIAGNOSIS — J441 Chronic obstructive pulmonary disease with (acute) exacerbation: Secondary | ICD-10-CM | POA: Diagnosis not present

## 2015-05-28 DIAGNOSIS — D509 Iron deficiency anemia, unspecified: Secondary | ICD-10-CM | POA: Diagnosis not present

## 2015-05-29 DIAGNOSIS — Z992 Dependence on renal dialysis: Secondary | ICD-10-CM | POA: Diagnosis not present

## 2015-05-29 DIAGNOSIS — N186 End stage renal disease: Secondary | ICD-10-CM | POA: Diagnosis not present

## 2015-05-29 DIAGNOSIS — D631 Anemia in chronic kidney disease: Secondary | ICD-10-CM | POA: Diagnosis not present

## 2015-05-29 DIAGNOSIS — D509 Iron deficiency anemia, unspecified: Secondary | ICD-10-CM | POA: Diagnosis not present

## 2015-05-30 DIAGNOSIS — D509 Iron deficiency anemia, unspecified: Secondary | ICD-10-CM | POA: Diagnosis not present

## 2015-05-30 DIAGNOSIS — D631 Anemia in chronic kidney disease: Secondary | ICD-10-CM | POA: Diagnosis not present

## 2015-05-30 DIAGNOSIS — N186 End stage renal disease: Secondary | ICD-10-CM | POA: Diagnosis not present

## 2015-05-30 DIAGNOSIS — Z992 Dependence on renal dialysis: Secondary | ICD-10-CM | POA: Diagnosis not present

## 2015-05-31 DIAGNOSIS — D509 Iron deficiency anemia, unspecified: Secondary | ICD-10-CM | POA: Diagnosis not present

## 2015-05-31 DIAGNOSIS — J441 Chronic obstructive pulmonary disease with (acute) exacerbation: Secondary | ICD-10-CM | POA: Diagnosis not present

## 2015-05-31 DIAGNOSIS — Z9181 History of falling: Secondary | ICD-10-CM | POA: Diagnosis not present

## 2015-05-31 DIAGNOSIS — J9691 Respiratory failure, unspecified with hypoxia: Secondary | ICD-10-CM | POA: Diagnosis not present

## 2015-05-31 DIAGNOSIS — Z9981 Dependence on supplemental oxygen: Secondary | ICD-10-CM | POA: Diagnosis not present

## 2015-05-31 DIAGNOSIS — R531 Weakness: Secondary | ICD-10-CM | POA: Diagnosis not present

## 2015-05-31 DIAGNOSIS — I509 Heart failure, unspecified: Secondary | ICD-10-CM | POA: Diagnosis not present

## 2015-05-31 DIAGNOSIS — D631 Anemia in chronic kidney disease: Secondary | ICD-10-CM | POA: Diagnosis not present

## 2015-05-31 DIAGNOSIS — Z992 Dependence on renal dialysis: Secondary | ICD-10-CM | POA: Diagnosis not present

## 2015-05-31 DIAGNOSIS — N186 End stage renal disease: Secondary | ICD-10-CM | POA: Diagnosis not present

## 2015-06-01 DIAGNOSIS — D509 Iron deficiency anemia, unspecified: Secondary | ICD-10-CM | POA: Diagnosis not present

## 2015-06-01 DIAGNOSIS — Z992 Dependence on renal dialysis: Secondary | ICD-10-CM | POA: Diagnosis not present

## 2015-06-01 DIAGNOSIS — D631 Anemia in chronic kidney disease: Secondary | ICD-10-CM | POA: Diagnosis not present

## 2015-06-01 DIAGNOSIS — N186 End stage renal disease: Secondary | ICD-10-CM | POA: Diagnosis not present

## 2015-06-02 DIAGNOSIS — Z9981 Dependence on supplemental oxygen: Secondary | ICD-10-CM | POA: Diagnosis not present

## 2015-06-02 DIAGNOSIS — D509 Iron deficiency anemia, unspecified: Secondary | ICD-10-CM | POA: Diagnosis not present

## 2015-06-02 DIAGNOSIS — J9691 Respiratory failure, unspecified with hypoxia: Secondary | ICD-10-CM | POA: Diagnosis not present

## 2015-06-02 DIAGNOSIS — D631 Anemia in chronic kidney disease: Secondary | ICD-10-CM | POA: Diagnosis not present

## 2015-06-02 DIAGNOSIS — J441 Chronic obstructive pulmonary disease with (acute) exacerbation: Secondary | ICD-10-CM | POA: Diagnosis not present

## 2015-06-02 DIAGNOSIS — N186 End stage renal disease: Secondary | ICD-10-CM | POA: Diagnosis not present

## 2015-06-02 DIAGNOSIS — Z9181 History of falling: Secondary | ICD-10-CM | POA: Diagnosis not present

## 2015-06-02 DIAGNOSIS — Z992 Dependence on renal dialysis: Secondary | ICD-10-CM | POA: Diagnosis not present

## 2015-06-02 DIAGNOSIS — R531 Weakness: Secondary | ICD-10-CM | POA: Diagnosis not present

## 2015-06-02 DIAGNOSIS — I509 Heart failure, unspecified: Secondary | ICD-10-CM | POA: Diagnosis not present

## 2015-06-03 DIAGNOSIS — N186 End stage renal disease: Secondary | ICD-10-CM | POA: Diagnosis not present

## 2015-06-03 DIAGNOSIS — Z992 Dependence on renal dialysis: Secondary | ICD-10-CM | POA: Diagnosis not present

## 2015-06-03 DIAGNOSIS — D509 Iron deficiency anemia, unspecified: Secondary | ICD-10-CM | POA: Diagnosis not present

## 2015-06-03 DIAGNOSIS — D631 Anemia in chronic kidney disease: Secondary | ICD-10-CM | POA: Diagnosis not present

## 2015-06-04 DIAGNOSIS — J441 Chronic obstructive pulmonary disease with (acute) exacerbation: Secondary | ICD-10-CM | POA: Diagnosis not present

## 2015-06-04 DIAGNOSIS — R531 Weakness: Secondary | ICD-10-CM | POA: Diagnosis not present

## 2015-06-04 DIAGNOSIS — Z9181 History of falling: Secondary | ICD-10-CM | POA: Diagnosis not present

## 2015-06-04 DIAGNOSIS — Z9981 Dependence on supplemental oxygen: Secondary | ICD-10-CM | POA: Diagnosis not present

## 2015-06-04 DIAGNOSIS — I509 Heart failure, unspecified: Secondary | ICD-10-CM | POA: Diagnosis not present

## 2015-06-04 DIAGNOSIS — J9691 Respiratory failure, unspecified with hypoxia: Secondary | ICD-10-CM | POA: Diagnosis not present

## 2015-06-05 DIAGNOSIS — Z9181 History of falling: Secondary | ICD-10-CM | POA: Diagnosis not present

## 2015-06-05 DIAGNOSIS — Z853 Personal history of malignant neoplasm of breast: Secondary | ICD-10-CM | POA: Diagnosis not present

## 2015-06-05 DIAGNOSIS — R531 Weakness: Secondary | ICD-10-CM | POA: Diagnosis not present

## 2015-06-05 DIAGNOSIS — J9691 Respiratory failure, unspecified with hypoxia: Secondary | ICD-10-CM | POA: Diagnosis not present

## 2015-06-05 DIAGNOSIS — R627 Adult failure to thrive: Secondary | ICD-10-CM | POA: Diagnosis not present

## 2015-06-05 DIAGNOSIS — Z812 Family history of tobacco abuse and dependence: Secondary | ICD-10-CM | POA: Diagnosis not present

## 2015-06-05 DIAGNOSIS — J441 Chronic obstructive pulmonary disease with (acute) exacerbation: Secondary | ICD-10-CM | POA: Diagnosis not present

## 2015-06-05 DIAGNOSIS — Z992 Dependence on renal dialysis: Secondary | ICD-10-CM | POA: Diagnosis not present

## 2015-06-05 DIAGNOSIS — I509 Heart failure, unspecified: Secondary | ICD-10-CM | POA: Diagnosis not present

## 2015-06-05 DIAGNOSIS — N186 End stage renal disease: Secondary | ICD-10-CM | POA: Diagnosis not present

## 2015-06-05 DIAGNOSIS — Z923 Personal history of irradiation: Secondary | ICD-10-CM | POA: Diagnosis not present

## 2015-06-05 DIAGNOSIS — Z9981 Dependence on supplemental oxygen: Secondary | ICD-10-CM | POA: Diagnosis not present

## 2015-06-06 DIAGNOSIS — N186 End stage renal disease: Secondary | ICD-10-CM | POA: Diagnosis not present

## 2015-06-06 DIAGNOSIS — N2581 Secondary hyperparathyroidism of renal origin: Secondary | ICD-10-CM | POA: Diagnosis not present

## 2015-06-06 DIAGNOSIS — D631 Anemia in chronic kidney disease: Secondary | ICD-10-CM | POA: Diagnosis not present

## 2015-06-06 DIAGNOSIS — Z992 Dependence on renal dialysis: Secondary | ICD-10-CM | POA: Diagnosis not present

## 2015-06-06 DIAGNOSIS — D509 Iron deficiency anemia, unspecified: Secondary | ICD-10-CM | POA: Diagnosis not present

## 2015-06-07 DIAGNOSIS — N2581 Secondary hyperparathyroidism of renal origin: Secondary | ICD-10-CM | POA: Diagnosis not present

## 2015-06-07 DIAGNOSIS — N186 End stage renal disease: Secondary | ICD-10-CM | POA: Diagnosis not present

## 2015-06-07 DIAGNOSIS — R531 Weakness: Secondary | ICD-10-CM | POA: Diagnosis not present

## 2015-06-07 DIAGNOSIS — Z992 Dependence on renal dialysis: Secondary | ICD-10-CM | POA: Diagnosis not present

## 2015-06-07 DIAGNOSIS — J9691 Respiratory failure, unspecified with hypoxia: Secondary | ICD-10-CM | POA: Diagnosis not present

## 2015-06-07 DIAGNOSIS — Z9981 Dependence on supplemental oxygen: Secondary | ICD-10-CM | POA: Diagnosis not present

## 2015-06-07 DIAGNOSIS — I509 Heart failure, unspecified: Secondary | ICD-10-CM | POA: Diagnosis not present

## 2015-06-07 DIAGNOSIS — D631 Anemia in chronic kidney disease: Secondary | ICD-10-CM | POA: Diagnosis not present

## 2015-06-07 DIAGNOSIS — D509 Iron deficiency anemia, unspecified: Secondary | ICD-10-CM | POA: Diagnosis not present

## 2015-06-07 DIAGNOSIS — J441 Chronic obstructive pulmonary disease with (acute) exacerbation: Secondary | ICD-10-CM | POA: Diagnosis not present

## 2015-06-07 DIAGNOSIS — Z9181 History of falling: Secondary | ICD-10-CM | POA: Diagnosis not present

## 2015-06-08 DIAGNOSIS — Z9181 History of falling: Secondary | ICD-10-CM | POA: Diagnosis not present

## 2015-06-08 DIAGNOSIS — Z9981 Dependence on supplemental oxygen: Secondary | ICD-10-CM | POA: Diagnosis not present

## 2015-06-08 DIAGNOSIS — D631 Anemia in chronic kidney disease: Secondary | ICD-10-CM | POA: Diagnosis not present

## 2015-06-08 DIAGNOSIS — R531 Weakness: Secondary | ICD-10-CM | POA: Diagnosis not present

## 2015-06-08 DIAGNOSIS — N186 End stage renal disease: Secondary | ICD-10-CM | POA: Diagnosis not present

## 2015-06-08 DIAGNOSIS — N2581 Secondary hyperparathyroidism of renal origin: Secondary | ICD-10-CM | POA: Diagnosis not present

## 2015-06-08 DIAGNOSIS — D509 Iron deficiency anemia, unspecified: Secondary | ICD-10-CM | POA: Diagnosis not present

## 2015-06-08 DIAGNOSIS — Z992 Dependence on renal dialysis: Secondary | ICD-10-CM | POA: Diagnosis not present

## 2015-06-08 DIAGNOSIS — J441 Chronic obstructive pulmonary disease with (acute) exacerbation: Secondary | ICD-10-CM | POA: Diagnosis not present

## 2015-06-08 DIAGNOSIS — J9691 Respiratory failure, unspecified with hypoxia: Secondary | ICD-10-CM | POA: Diagnosis not present

## 2015-06-08 DIAGNOSIS — I509 Heart failure, unspecified: Secondary | ICD-10-CM | POA: Diagnosis not present

## 2015-06-09 DIAGNOSIS — D509 Iron deficiency anemia, unspecified: Secondary | ICD-10-CM | POA: Diagnosis not present

## 2015-06-09 DIAGNOSIS — Z9981 Dependence on supplemental oxygen: Secondary | ICD-10-CM | POA: Diagnosis not present

## 2015-06-09 DIAGNOSIS — I509 Heart failure, unspecified: Secondary | ICD-10-CM | POA: Diagnosis not present

## 2015-06-09 DIAGNOSIS — R531 Weakness: Secondary | ICD-10-CM | POA: Diagnosis not present

## 2015-06-09 DIAGNOSIS — J441 Chronic obstructive pulmonary disease with (acute) exacerbation: Secondary | ICD-10-CM | POA: Diagnosis not present

## 2015-06-09 DIAGNOSIS — D631 Anemia in chronic kidney disease: Secondary | ICD-10-CM | POA: Diagnosis not present

## 2015-06-09 DIAGNOSIS — J9691 Respiratory failure, unspecified with hypoxia: Secondary | ICD-10-CM | POA: Diagnosis not present

## 2015-06-09 DIAGNOSIS — Z992 Dependence on renal dialysis: Secondary | ICD-10-CM | POA: Diagnosis not present

## 2015-06-09 DIAGNOSIS — N2581 Secondary hyperparathyroidism of renal origin: Secondary | ICD-10-CM | POA: Diagnosis not present

## 2015-06-09 DIAGNOSIS — N186 End stage renal disease: Secondary | ICD-10-CM | POA: Diagnosis not present

## 2015-06-09 DIAGNOSIS — Z9181 History of falling: Secondary | ICD-10-CM | POA: Diagnosis not present

## 2015-06-10 DIAGNOSIS — D509 Iron deficiency anemia, unspecified: Secondary | ICD-10-CM | POA: Diagnosis not present

## 2015-06-10 DIAGNOSIS — Z9981 Dependence on supplemental oxygen: Secondary | ICD-10-CM | POA: Diagnosis not present

## 2015-06-10 DIAGNOSIS — I509 Heart failure, unspecified: Secondary | ICD-10-CM | POA: Diagnosis not present

## 2015-06-10 DIAGNOSIS — Z9181 History of falling: Secondary | ICD-10-CM | POA: Diagnosis not present

## 2015-06-10 DIAGNOSIS — D631 Anemia in chronic kidney disease: Secondary | ICD-10-CM | POA: Diagnosis not present

## 2015-06-10 DIAGNOSIS — N2581 Secondary hyperparathyroidism of renal origin: Secondary | ICD-10-CM | POA: Diagnosis not present

## 2015-06-10 DIAGNOSIS — Z992 Dependence on renal dialysis: Secondary | ICD-10-CM | POA: Diagnosis not present

## 2015-06-10 DIAGNOSIS — J9691 Respiratory failure, unspecified with hypoxia: Secondary | ICD-10-CM | POA: Diagnosis not present

## 2015-06-10 DIAGNOSIS — N186 End stage renal disease: Secondary | ICD-10-CM | POA: Diagnosis not present

## 2015-06-10 DIAGNOSIS — R531 Weakness: Secondary | ICD-10-CM | POA: Diagnosis not present

## 2015-06-10 DIAGNOSIS — J441 Chronic obstructive pulmonary disease with (acute) exacerbation: Secondary | ICD-10-CM | POA: Diagnosis not present

## 2015-06-11 DIAGNOSIS — Z992 Dependence on renal dialysis: Secondary | ICD-10-CM | POA: Diagnosis not present

## 2015-06-11 DIAGNOSIS — Z9981 Dependence on supplemental oxygen: Secondary | ICD-10-CM | POA: Diagnosis not present

## 2015-06-11 DIAGNOSIS — D631 Anemia in chronic kidney disease: Secondary | ICD-10-CM | POA: Diagnosis not present

## 2015-06-11 DIAGNOSIS — I509 Heart failure, unspecified: Secondary | ICD-10-CM | POA: Diagnosis not present

## 2015-06-11 DIAGNOSIS — N186 End stage renal disease: Secondary | ICD-10-CM | POA: Diagnosis not present

## 2015-06-11 DIAGNOSIS — Z9181 History of falling: Secondary | ICD-10-CM | POA: Diagnosis not present

## 2015-06-11 DIAGNOSIS — N2581 Secondary hyperparathyroidism of renal origin: Secondary | ICD-10-CM | POA: Diagnosis not present

## 2015-06-11 DIAGNOSIS — D509 Iron deficiency anemia, unspecified: Secondary | ICD-10-CM | POA: Diagnosis not present

## 2015-06-11 DIAGNOSIS — J441 Chronic obstructive pulmonary disease with (acute) exacerbation: Secondary | ICD-10-CM | POA: Diagnosis not present

## 2015-06-11 DIAGNOSIS — J9691 Respiratory failure, unspecified with hypoxia: Secondary | ICD-10-CM | POA: Diagnosis not present

## 2015-06-11 DIAGNOSIS — R531 Weakness: Secondary | ICD-10-CM | POA: Diagnosis not present

## 2015-06-12 DIAGNOSIS — D631 Anemia in chronic kidney disease: Secondary | ICD-10-CM | POA: Diagnosis not present

## 2015-06-12 DIAGNOSIS — Z992 Dependence on renal dialysis: Secondary | ICD-10-CM | POA: Diagnosis not present

## 2015-06-12 DIAGNOSIS — D509 Iron deficiency anemia, unspecified: Secondary | ICD-10-CM | POA: Diagnosis not present

## 2015-06-12 DIAGNOSIS — N186 End stage renal disease: Secondary | ICD-10-CM | POA: Diagnosis not present

## 2015-06-12 DIAGNOSIS — N2581 Secondary hyperparathyroidism of renal origin: Secondary | ICD-10-CM | POA: Diagnosis not present

## 2015-06-13 DIAGNOSIS — D509 Iron deficiency anemia, unspecified: Secondary | ICD-10-CM | POA: Diagnosis not present

## 2015-06-13 DIAGNOSIS — D631 Anemia in chronic kidney disease: Secondary | ICD-10-CM | POA: Diagnosis not present

## 2015-06-13 DIAGNOSIS — N2581 Secondary hyperparathyroidism of renal origin: Secondary | ICD-10-CM | POA: Diagnosis not present

## 2015-06-13 DIAGNOSIS — Z992 Dependence on renal dialysis: Secondary | ICD-10-CM | POA: Diagnosis not present

## 2015-06-13 DIAGNOSIS — N186 End stage renal disease: Secondary | ICD-10-CM | POA: Diagnosis not present

## 2015-06-14 DIAGNOSIS — I509 Heart failure, unspecified: Secondary | ICD-10-CM | POA: Diagnosis not present

## 2015-06-14 DIAGNOSIS — J9691 Respiratory failure, unspecified with hypoxia: Secondary | ICD-10-CM | POA: Diagnosis not present

## 2015-06-14 DIAGNOSIS — Z9181 History of falling: Secondary | ICD-10-CM | POA: Diagnosis not present

## 2015-06-14 DIAGNOSIS — Z992 Dependence on renal dialysis: Secondary | ICD-10-CM | POA: Diagnosis not present

## 2015-06-14 DIAGNOSIS — J441 Chronic obstructive pulmonary disease with (acute) exacerbation: Secondary | ICD-10-CM | POA: Diagnosis not present

## 2015-06-14 DIAGNOSIS — D509 Iron deficiency anemia, unspecified: Secondary | ICD-10-CM | POA: Diagnosis not present

## 2015-06-14 DIAGNOSIS — R531 Weakness: Secondary | ICD-10-CM | POA: Diagnosis not present

## 2015-06-14 DIAGNOSIS — D631 Anemia in chronic kidney disease: Secondary | ICD-10-CM | POA: Diagnosis not present

## 2015-06-14 DIAGNOSIS — Z9981 Dependence on supplemental oxygen: Secondary | ICD-10-CM | POA: Diagnosis not present

## 2015-06-14 DIAGNOSIS — N2581 Secondary hyperparathyroidism of renal origin: Secondary | ICD-10-CM | POA: Diagnosis not present

## 2015-06-14 DIAGNOSIS — N186 End stage renal disease: Secondary | ICD-10-CM | POA: Diagnosis not present

## 2015-06-15 DIAGNOSIS — N186 End stage renal disease: Secondary | ICD-10-CM | POA: Diagnosis not present

## 2015-06-15 DIAGNOSIS — J441 Chronic obstructive pulmonary disease with (acute) exacerbation: Secondary | ICD-10-CM | POA: Diagnosis not present

## 2015-06-15 DIAGNOSIS — Z992 Dependence on renal dialysis: Secondary | ICD-10-CM | POA: Diagnosis not present

## 2015-06-15 DIAGNOSIS — I509 Heart failure, unspecified: Secondary | ICD-10-CM | POA: Diagnosis not present

## 2015-06-15 DIAGNOSIS — D631 Anemia in chronic kidney disease: Secondary | ICD-10-CM | POA: Diagnosis not present

## 2015-06-15 DIAGNOSIS — J9691 Respiratory failure, unspecified with hypoxia: Secondary | ICD-10-CM | POA: Diagnosis not present

## 2015-06-15 DIAGNOSIS — Z9981 Dependence on supplemental oxygen: Secondary | ICD-10-CM | POA: Diagnosis not present

## 2015-06-15 DIAGNOSIS — R531 Weakness: Secondary | ICD-10-CM | POA: Diagnosis not present

## 2015-06-15 DIAGNOSIS — N2581 Secondary hyperparathyroidism of renal origin: Secondary | ICD-10-CM | POA: Diagnosis not present

## 2015-06-15 DIAGNOSIS — D509 Iron deficiency anemia, unspecified: Secondary | ICD-10-CM | POA: Diagnosis not present

## 2015-06-15 DIAGNOSIS — Z9181 History of falling: Secondary | ICD-10-CM | POA: Diagnosis not present

## 2015-06-16 DIAGNOSIS — N2581 Secondary hyperparathyroidism of renal origin: Secondary | ICD-10-CM | POA: Diagnosis not present

## 2015-06-16 DIAGNOSIS — D509 Iron deficiency anemia, unspecified: Secondary | ICD-10-CM | POA: Diagnosis not present

## 2015-06-16 DIAGNOSIS — Z9181 History of falling: Secondary | ICD-10-CM | POA: Diagnosis not present

## 2015-06-16 DIAGNOSIS — J441 Chronic obstructive pulmonary disease with (acute) exacerbation: Secondary | ICD-10-CM | POA: Diagnosis not present

## 2015-06-16 DIAGNOSIS — J9691 Respiratory failure, unspecified with hypoxia: Secondary | ICD-10-CM | POA: Diagnosis not present

## 2015-06-16 DIAGNOSIS — Z9981 Dependence on supplemental oxygen: Secondary | ICD-10-CM | POA: Diagnosis not present

## 2015-06-16 DIAGNOSIS — Z992 Dependence on renal dialysis: Secondary | ICD-10-CM | POA: Diagnosis not present

## 2015-06-16 DIAGNOSIS — I509 Heart failure, unspecified: Secondary | ICD-10-CM | POA: Diagnosis not present

## 2015-06-16 DIAGNOSIS — D631 Anemia in chronic kidney disease: Secondary | ICD-10-CM | POA: Diagnosis not present

## 2015-06-16 DIAGNOSIS — R531 Weakness: Secondary | ICD-10-CM | POA: Diagnosis not present

## 2015-06-16 DIAGNOSIS — N186 End stage renal disease: Secondary | ICD-10-CM | POA: Diagnosis not present

## 2015-06-17 DIAGNOSIS — D631 Anemia in chronic kidney disease: Secondary | ICD-10-CM | POA: Diagnosis not present

## 2015-06-17 DIAGNOSIS — Z992 Dependence on renal dialysis: Secondary | ICD-10-CM | POA: Diagnosis not present

## 2015-06-17 DIAGNOSIS — N186 End stage renal disease: Secondary | ICD-10-CM | POA: Diagnosis not present

## 2015-06-17 DIAGNOSIS — D509 Iron deficiency anemia, unspecified: Secondary | ICD-10-CM | POA: Diagnosis not present

## 2015-06-17 DIAGNOSIS — N2581 Secondary hyperparathyroidism of renal origin: Secondary | ICD-10-CM | POA: Diagnosis not present

## 2015-06-18 DIAGNOSIS — N2581 Secondary hyperparathyroidism of renal origin: Secondary | ICD-10-CM | POA: Diagnosis not present

## 2015-06-18 DIAGNOSIS — D631 Anemia in chronic kidney disease: Secondary | ICD-10-CM | POA: Diagnosis not present

## 2015-06-18 DIAGNOSIS — J441 Chronic obstructive pulmonary disease with (acute) exacerbation: Secondary | ICD-10-CM | POA: Diagnosis not present

## 2015-06-18 DIAGNOSIS — D509 Iron deficiency anemia, unspecified: Secondary | ICD-10-CM | POA: Diagnosis not present

## 2015-06-18 DIAGNOSIS — R531 Weakness: Secondary | ICD-10-CM | POA: Diagnosis not present

## 2015-06-18 DIAGNOSIS — Z992 Dependence on renal dialysis: Secondary | ICD-10-CM | POA: Diagnosis not present

## 2015-06-18 DIAGNOSIS — Z9181 History of falling: Secondary | ICD-10-CM | POA: Diagnosis not present

## 2015-06-18 DIAGNOSIS — I509 Heart failure, unspecified: Secondary | ICD-10-CM | POA: Diagnosis not present

## 2015-06-18 DIAGNOSIS — J9691 Respiratory failure, unspecified with hypoxia: Secondary | ICD-10-CM | POA: Diagnosis not present

## 2015-06-18 DIAGNOSIS — N186 End stage renal disease: Secondary | ICD-10-CM | POA: Diagnosis not present

## 2015-06-18 DIAGNOSIS — Z9981 Dependence on supplemental oxygen: Secondary | ICD-10-CM | POA: Diagnosis not present

## 2015-06-19 DIAGNOSIS — N2581 Secondary hyperparathyroidism of renal origin: Secondary | ICD-10-CM | POA: Diagnosis not present

## 2015-06-19 DIAGNOSIS — Z992 Dependence on renal dialysis: Secondary | ICD-10-CM | POA: Diagnosis not present

## 2015-06-19 DIAGNOSIS — N186 End stage renal disease: Secondary | ICD-10-CM | POA: Diagnosis not present

## 2015-06-19 DIAGNOSIS — D631 Anemia in chronic kidney disease: Secondary | ICD-10-CM | POA: Diagnosis not present

## 2015-06-19 DIAGNOSIS — D509 Iron deficiency anemia, unspecified: Secondary | ICD-10-CM | POA: Diagnosis not present

## 2015-06-20 DIAGNOSIS — N186 End stage renal disease: Secondary | ICD-10-CM | POA: Diagnosis not present

## 2015-06-20 DIAGNOSIS — N2581 Secondary hyperparathyroidism of renal origin: Secondary | ICD-10-CM | POA: Diagnosis not present

## 2015-06-20 DIAGNOSIS — D509 Iron deficiency anemia, unspecified: Secondary | ICD-10-CM | POA: Diagnosis not present

## 2015-06-20 DIAGNOSIS — Z992 Dependence on renal dialysis: Secondary | ICD-10-CM | POA: Diagnosis not present

## 2015-06-20 DIAGNOSIS — D631 Anemia in chronic kidney disease: Secondary | ICD-10-CM | POA: Diagnosis not present

## 2015-06-21 DIAGNOSIS — R531 Weakness: Secondary | ICD-10-CM | POA: Diagnosis not present

## 2015-06-21 DIAGNOSIS — D509 Iron deficiency anemia, unspecified: Secondary | ICD-10-CM | POA: Diagnosis not present

## 2015-06-21 DIAGNOSIS — N2581 Secondary hyperparathyroidism of renal origin: Secondary | ICD-10-CM | POA: Diagnosis not present

## 2015-06-21 DIAGNOSIS — Z9181 History of falling: Secondary | ICD-10-CM | POA: Diagnosis not present

## 2015-06-21 DIAGNOSIS — J9691 Respiratory failure, unspecified with hypoxia: Secondary | ICD-10-CM | POA: Diagnosis not present

## 2015-06-21 DIAGNOSIS — D631 Anemia in chronic kidney disease: Secondary | ICD-10-CM | POA: Diagnosis not present

## 2015-06-21 DIAGNOSIS — Z992 Dependence on renal dialysis: Secondary | ICD-10-CM | POA: Diagnosis not present

## 2015-06-21 DIAGNOSIS — N186 End stage renal disease: Secondary | ICD-10-CM | POA: Diagnosis not present

## 2015-06-21 DIAGNOSIS — Z9981 Dependence on supplemental oxygen: Secondary | ICD-10-CM | POA: Diagnosis not present

## 2015-06-21 DIAGNOSIS — I509 Heart failure, unspecified: Secondary | ICD-10-CM | POA: Diagnosis not present

## 2015-06-21 DIAGNOSIS — J441 Chronic obstructive pulmonary disease with (acute) exacerbation: Secondary | ICD-10-CM | POA: Diagnosis not present

## 2015-06-22 DIAGNOSIS — I509 Heart failure, unspecified: Secondary | ICD-10-CM | POA: Diagnosis not present

## 2015-06-22 DIAGNOSIS — N2581 Secondary hyperparathyroidism of renal origin: Secondary | ICD-10-CM | POA: Diagnosis not present

## 2015-06-22 DIAGNOSIS — Z992 Dependence on renal dialysis: Secondary | ICD-10-CM | POA: Diagnosis not present

## 2015-06-22 DIAGNOSIS — D509 Iron deficiency anemia, unspecified: Secondary | ICD-10-CM | POA: Diagnosis not present

## 2015-06-22 DIAGNOSIS — R531 Weakness: Secondary | ICD-10-CM | POA: Diagnosis not present

## 2015-06-22 DIAGNOSIS — J9691 Respiratory failure, unspecified with hypoxia: Secondary | ICD-10-CM | POA: Diagnosis not present

## 2015-06-22 DIAGNOSIS — N186 End stage renal disease: Secondary | ICD-10-CM | POA: Diagnosis not present

## 2015-06-22 DIAGNOSIS — D631 Anemia in chronic kidney disease: Secondary | ICD-10-CM | POA: Diagnosis not present

## 2015-06-22 DIAGNOSIS — J441 Chronic obstructive pulmonary disease with (acute) exacerbation: Secondary | ICD-10-CM | POA: Diagnosis not present

## 2015-06-22 DIAGNOSIS — Z9181 History of falling: Secondary | ICD-10-CM | POA: Diagnosis not present

## 2015-06-22 DIAGNOSIS — Z9981 Dependence on supplemental oxygen: Secondary | ICD-10-CM | POA: Diagnosis not present

## 2015-06-23 DIAGNOSIS — J9691 Respiratory failure, unspecified with hypoxia: Secondary | ICD-10-CM | POA: Diagnosis not present

## 2015-06-23 DIAGNOSIS — Z9181 History of falling: Secondary | ICD-10-CM | POA: Diagnosis not present

## 2015-06-23 DIAGNOSIS — N186 End stage renal disease: Secondary | ICD-10-CM | POA: Diagnosis not present

## 2015-06-23 DIAGNOSIS — N2581 Secondary hyperparathyroidism of renal origin: Secondary | ICD-10-CM | POA: Diagnosis not present

## 2015-06-23 DIAGNOSIS — I509 Heart failure, unspecified: Secondary | ICD-10-CM | POA: Diagnosis not present

## 2015-06-23 DIAGNOSIS — D631 Anemia in chronic kidney disease: Secondary | ICD-10-CM | POA: Diagnosis not present

## 2015-06-23 DIAGNOSIS — Z992 Dependence on renal dialysis: Secondary | ICD-10-CM | POA: Diagnosis not present

## 2015-06-23 DIAGNOSIS — Z9981 Dependence on supplemental oxygen: Secondary | ICD-10-CM | POA: Diagnosis not present

## 2015-06-23 DIAGNOSIS — D509 Iron deficiency anemia, unspecified: Secondary | ICD-10-CM | POA: Diagnosis not present

## 2015-06-23 DIAGNOSIS — R531 Weakness: Secondary | ICD-10-CM | POA: Diagnosis not present

## 2015-06-23 DIAGNOSIS — J441 Chronic obstructive pulmonary disease with (acute) exacerbation: Secondary | ICD-10-CM | POA: Diagnosis not present

## 2015-06-24 DIAGNOSIS — D509 Iron deficiency anemia, unspecified: Secondary | ICD-10-CM | POA: Diagnosis not present

## 2015-06-24 DIAGNOSIS — Z992 Dependence on renal dialysis: Secondary | ICD-10-CM | POA: Diagnosis not present

## 2015-06-24 DIAGNOSIS — D631 Anemia in chronic kidney disease: Secondary | ICD-10-CM | POA: Diagnosis not present

## 2015-06-24 DIAGNOSIS — N186 End stage renal disease: Secondary | ICD-10-CM | POA: Diagnosis not present

## 2015-06-24 DIAGNOSIS — N2581 Secondary hyperparathyroidism of renal origin: Secondary | ICD-10-CM | POA: Diagnosis not present

## 2015-06-25 DIAGNOSIS — J9691 Respiratory failure, unspecified with hypoxia: Secondary | ICD-10-CM | POA: Diagnosis not present

## 2015-06-25 DIAGNOSIS — D631 Anemia in chronic kidney disease: Secondary | ICD-10-CM | POA: Diagnosis not present

## 2015-06-25 DIAGNOSIS — J441 Chronic obstructive pulmonary disease with (acute) exacerbation: Secondary | ICD-10-CM | POA: Diagnosis not present

## 2015-06-25 DIAGNOSIS — R531 Weakness: Secondary | ICD-10-CM | POA: Diagnosis not present

## 2015-06-25 DIAGNOSIS — Z9981 Dependence on supplemental oxygen: Secondary | ICD-10-CM | POA: Diagnosis not present

## 2015-06-25 DIAGNOSIS — N2581 Secondary hyperparathyroidism of renal origin: Secondary | ICD-10-CM | POA: Diagnosis not present

## 2015-06-25 DIAGNOSIS — N186 End stage renal disease: Secondary | ICD-10-CM | POA: Diagnosis not present

## 2015-06-25 DIAGNOSIS — Z992 Dependence on renal dialysis: Secondary | ICD-10-CM | POA: Diagnosis not present

## 2015-06-25 DIAGNOSIS — I509 Heart failure, unspecified: Secondary | ICD-10-CM | POA: Diagnosis not present

## 2015-06-25 DIAGNOSIS — D509 Iron deficiency anemia, unspecified: Secondary | ICD-10-CM | POA: Diagnosis not present

## 2015-06-25 DIAGNOSIS — Z9181 History of falling: Secondary | ICD-10-CM | POA: Diagnosis not present

## 2015-06-26 DIAGNOSIS — Z992 Dependence on renal dialysis: Secondary | ICD-10-CM | POA: Diagnosis not present

## 2015-06-26 DIAGNOSIS — N186 End stage renal disease: Secondary | ICD-10-CM | POA: Diagnosis not present

## 2015-06-26 DIAGNOSIS — D509 Iron deficiency anemia, unspecified: Secondary | ICD-10-CM | POA: Diagnosis not present

## 2015-06-26 DIAGNOSIS — D631 Anemia in chronic kidney disease: Secondary | ICD-10-CM | POA: Diagnosis not present

## 2015-06-26 DIAGNOSIS — N2581 Secondary hyperparathyroidism of renal origin: Secondary | ICD-10-CM | POA: Diagnosis not present

## 2015-06-27 DIAGNOSIS — Z992 Dependence on renal dialysis: Secondary | ICD-10-CM | POA: Diagnosis not present

## 2015-06-27 DIAGNOSIS — D509 Iron deficiency anemia, unspecified: Secondary | ICD-10-CM | POA: Diagnosis not present

## 2015-06-27 DIAGNOSIS — N2581 Secondary hyperparathyroidism of renal origin: Secondary | ICD-10-CM | POA: Diagnosis not present

## 2015-06-27 DIAGNOSIS — N186 End stage renal disease: Secondary | ICD-10-CM | POA: Diagnosis not present

## 2015-06-27 DIAGNOSIS — D631 Anemia in chronic kidney disease: Secondary | ICD-10-CM | POA: Diagnosis not present

## 2015-06-28 DIAGNOSIS — N186 End stage renal disease: Secondary | ICD-10-CM | POA: Diagnosis not present

## 2015-06-28 DIAGNOSIS — Z9181 History of falling: Secondary | ICD-10-CM | POA: Diagnosis not present

## 2015-06-28 DIAGNOSIS — Z992 Dependence on renal dialysis: Secondary | ICD-10-CM | POA: Diagnosis not present

## 2015-06-28 DIAGNOSIS — D509 Iron deficiency anemia, unspecified: Secondary | ICD-10-CM | POA: Diagnosis not present

## 2015-06-28 DIAGNOSIS — J441 Chronic obstructive pulmonary disease with (acute) exacerbation: Secondary | ICD-10-CM | POA: Diagnosis not present

## 2015-06-28 DIAGNOSIS — R531 Weakness: Secondary | ICD-10-CM | POA: Diagnosis not present

## 2015-06-28 DIAGNOSIS — D631 Anemia in chronic kidney disease: Secondary | ICD-10-CM | POA: Diagnosis not present

## 2015-06-28 DIAGNOSIS — I509 Heart failure, unspecified: Secondary | ICD-10-CM | POA: Diagnosis not present

## 2015-06-28 DIAGNOSIS — Z9981 Dependence on supplemental oxygen: Secondary | ICD-10-CM | POA: Diagnosis not present

## 2015-06-28 DIAGNOSIS — J9691 Respiratory failure, unspecified with hypoxia: Secondary | ICD-10-CM | POA: Diagnosis not present

## 2015-06-28 DIAGNOSIS — N2581 Secondary hyperparathyroidism of renal origin: Secondary | ICD-10-CM | POA: Diagnosis not present

## 2015-06-29 DIAGNOSIS — N186 End stage renal disease: Secondary | ICD-10-CM | POA: Diagnosis not present

## 2015-06-29 DIAGNOSIS — R531 Weakness: Secondary | ICD-10-CM | POA: Diagnosis not present

## 2015-06-29 DIAGNOSIS — I509 Heart failure, unspecified: Secondary | ICD-10-CM | POA: Diagnosis not present

## 2015-06-29 DIAGNOSIS — J9691 Respiratory failure, unspecified with hypoxia: Secondary | ICD-10-CM | POA: Diagnosis not present

## 2015-06-29 DIAGNOSIS — J441 Chronic obstructive pulmonary disease with (acute) exacerbation: Secondary | ICD-10-CM | POA: Diagnosis not present

## 2015-06-29 DIAGNOSIS — D509 Iron deficiency anemia, unspecified: Secondary | ICD-10-CM | POA: Diagnosis not present

## 2015-06-29 DIAGNOSIS — D631 Anemia in chronic kidney disease: Secondary | ICD-10-CM | POA: Diagnosis not present

## 2015-06-29 DIAGNOSIS — N2581 Secondary hyperparathyroidism of renal origin: Secondary | ICD-10-CM | POA: Diagnosis not present

## 2015-06-29 DIAGNOSIS — Z9181 History of falling: Secondary | ICD-10-CM | POA: Diagnosis not present

## 2015-06-29 DIAGNOSIS — Z9981 Dependence on supplemental oxygen: Secondary | ICD-10-CM | POA: Diagnosis not present

## 2015-06-29 DIAGNOSIS — Z992 Dependence on renal dialysis: Secondary | ICD-10-CM | POA: Diagnosis not present

## 2015-06-30 DIAGNOSIS — J441 Chronic obstructive pulmonary disease with (acute) exacerbation: Secondary | ICD-10-CM | POA: Diagnosis not present

## 2015-06-30 DIAGNOSIS — D631 Anemia in chronic kidney disease: Secondary | ICD-10-CM | POA: Diagnosis not present

## 2015-06-30 DIAGNOSIS — N2581 Secondary hyperparathyroidism of renal origin: Secondary | ICD-10-CM | POA: Diagnosis not present

## 2015-06-30 DIAGNOSIS — R531 Weakness: Secondary | ICD-10-CM | POA: Diagnosis not present

## 2015-06-30 DIAGNOSIS — Z992 Dependence on renal dialysis: Secondary | ICD-10-CM | POA: Diagnosis not present

## 2015-06-30 DIAGNOSIS — I509 Heart failure, unspecified: Secondary | ICD-10-CM | POA: Diagnosis not present

## 2015-06-30 DIAGNOSIS — Z9181 History of falling: Secondary | ICD-10-CM | POA: Diagnosis not present

## 2015-06-30 DIAGNOSIS — N186 End stage renal disease: Secondary | ICD-10-CM | POA: Diagnosis not present

## 2015-06-30 DIAGNOSIS — Z9981 Dependence on supplemental oxygen: Secondary | ICD-10-CM | POA: Diagnosis not present

## 2015-06-30 DIAGNOSIS — J9691 Respiratory failure, unspecified with hypoxia: Secondary | ICD-10-CM | POA: Diagnosis not present

## 2015-06-30 DIAGNOSIS — D509 Iron deficiency anemia, unspecified: Secondary | ICD-10-CM | POA: Diagnosis not present

## 2015-07-01 DIAGNOSIS — Z9181 History of falling: Secondary | ICD-10-CM | POA: Diagnosis not present

## 2015-07-01 DIAGNOSIS — I509 Heart failure, unspecified: Secondary | ICD-10-CM | POA: Diagnosis not present

## 2015-07-01 DIAGNOSIS — Z9981 Dependence on supplemental oxygen: Secondary | ICD-10-CM | POA: Diagnosis not present

## 2015-07-01 DIAGNOSIS — N186 End stage renal disease: Secondary | ICD-10-CM | POA: Diagnosis not present

## 2015-07-01 DIAGNOSIS — J441 Chronic obstructive pulmonary disease with (acute) exacerbation: Secondary | ICD-10-CM | POA: Diagnosis not present

## 2015-07-01 DIAGNOSIS — Z992 Dependence on renal dialysis: Secondary | ICD-10-CM | POA: Diagnosis not present

## 2015-07-01 DIAGNOSIS — E041 Nontoxic single thyroid nodule: Secondary | ICD-10-CM | POA: Diagnosis not present

## 2015-07-01 DIAGNOSIS — D631 Anemia in chronic kidney disease: Secondary | ICD-10-CM | POA: Diagnosis not present

## 2015-07-01 DIAGNOSIS — J9691 Respiratory failure, unspecified with hypoxia: Secondary | ICD-10-CM | POA: Diagnosis not present

## 2015-07-01 DIAGNOSIS — D509 Iron deficiency anemia, unspecified: Secondary | ICD-10-CM | POA: Diagnosis not present

## 2015-07-01 DIAGNOSIS — R531 Weakness: Secondary | ICD-10-CM | POA: Diagnosis not present

## 2015-07-01 DIAGNOSIS — R42 Dizziness and giddiness: Secondary | ICD-10-CM | POA: Diagnosis not present

## 2015-07-01 DIAGNOSIS — N2581 Secondary hyperparathyroidism of renal origin: Secondary | ICD-10-CM | POA: Diagnosis not present

## 2015-07-02 DIAGNOSIS — Z9981 Dependence on supplemental oxygen: Secondary | ICD-10-CM | POA: Diagnosis not present

## 2015-07-02 DIAGNOSIS — D631 Anemia in chronic kidney disease: Secondary | ICD-10-CM | POA: Diagnosis not present

## 2015-07-02 DIAGNOSIS — N186 End stage renal disease: Secondary | ICD-10-CM | POA: Diagnosis not present

## 2015-07-02 DIAGNOSIS — I509 Heart failure, unspecified: Secondary | ICD-10-CM | POA: Diagnosis not present

## 2015-07-02 DIAGNOSIS — J441 Chronic obstructive pulmonary disease with (acute) exacerbation: Secondary | ICD-10-CM | POA: Diagnosis not present

## 2015-07-02 DIAGNOSIS — D509 Iron deficiency anemia, unspecified: Secondary | ICD-10-CM | POA: Diagnosis not present

## 2015-07-02 DIAGNOSIS — Z992 Dependence on renal dialysis: Secondary | ICD-10-CM | POA: Diagnosis not present

## 2015-07-02 DIAGNOSIS — N2581 Secondary hyperparathyroidism of renal origin: Secondary | ICD-10-CM | POA: Diagnosis not present

## 2015-07-02 DIAGNOSIS — J9691 Respiratory failure, unspecified with hypoxia: Secondary | ICD-10-CM | POA: Diagnosis not present

## 2015-07-02 DIAGNOSIS — Z9181 History of falling: Secondary | ICD-10-CM | POA: Diagnosis not present

## 2015-07-02 DIAGNOSIS — R531 Weakness: Secondary | ICD-10-CM | POA: Diagnosis not present

## 2015-07-03 DIAGNOSIS — N2581 Secondary hyperparathyroidism of renal origin: Secondary | ICD-10-CM | POA: Diagnosis not present

## 2015-07-03 DIAGNOSIS — Z992 Dependence on renal dialysis: Secondary | ICD-10-CM | POA: Diagnosis not present

## 2015-07-03 DIAGNOSIS — D509 Iron deficiency anemia, unspecified: Secondary | ICD-10-CM | POA: Diagnosis not present

## 2015-07-03 DIAGNOSIS — D631 Anemia in chronic kidney disease: Secondary | ICD-10-CM | POA: Diagnosis not present

## 2015-07-03 DIAGNOSIS — N186 End stage renal disease: Secondary | ICD-10-CM | POA: Diagnosis not present

## 2015-07-04 DIAGNOSIS — N186 End stage renal disease: Secondary | ICD-10-CM | POA: Diagnosis not present

## 2015-07-04 DIAGNOSIS — D509 Iron deficiency anemia, unspecified: Secondary | ICD-10-CM | POA: Diagnosis not present

## 2015-07-04 DIAGNOSIS — D631 Anemia in chronic kidney disease: Secondary | ICD-10-CM | POA: Diagnosis not present

## 2015-07-04 DIAGNOSIS — N2581 Secondary hyperparathyroidism of renal origin: Secondary | ICD-10-CM | POA: Diagnosis not present

## 2015-07-04 DIAGNOSIS — Z992 Dependence on renal dialysis: Secondary | ICD-10-CM | POA: Diagnosis not present

## 2015-07-05 DIAGNOSIS — Z812 Family history of tobacco abuse and dependence: Secondary | ICD-10-CM | POA: Diagnosis not present

## 2015-07-05 DIAGNOSIS — Z9981 Dependence on supplemental oxygen: Secondary | ICD-10-CM | POA: Diagnosis not present

## 2015-07-05 DIAGNOSIS — Z9181 History of falling: Secondary | ICD-10-CM | POA: Diagnosis not present

## 2015-07-05 DIAGNOSIS — Z853 Personal history of malignant neoplasm of breast: Secondary | ICD-10-CM | POA: Diagnosis not present

## 2015-07-05 DIAGNOSIS — D631 Anemia in chronic kidney disease: Secondary | ICD-10-CM | POA: Diagnosis not present

## 2015-07-05 DIAGNOSIS — I509 Heart failure, unspecified: Secondary | ICD-10-CM | POA: Diagnosis not present

## 2015-07-05 DIAGNOSIS — R627 Adult failure to thrive: Secondary | ICD-10-CM | POA: Diagnosis not present

## 2015-07-05 DIAGNOSIS — Z992 Dependence on renal dialysis: Secondary | ICD-10-CM | POA: Diagnosis not present

## 2015-07-05 DIAGNOSIS — N186 End stage renal disease: Secondary | ICD-10-CM | POA: Diagnosis not present

## 2015-07-05 DIAGNOSIS — Z923 Personal history of irradiation: Secondary | ICD-10-CM | POA: Diagnosis not present

## 2015-07-05 DIAGNOSIS — J9691 Respiratory failure, unspecified with hypoxia: Secondary | ICD-10-CM | POA: Diagnosis not present

## 2015-07-05 DIAGNOSIS — R531 Weakness: Secondary | ICD-10-CM | POA: Diagnosis not present

## 2015-07-05 DIAGNOSIS — J441 Chronic obstructive pulmonary disease with (acute) exacerbation: Secondary | ICD-10-CM | POA: Diagnosis not present

## 2015-07-06 DIAGNOSIS — Z9981 Dependence on supplemental oxygen: Secondary | ICD-10-CM | POA: Diagnosis not present

## 2015-07-06 DIAGNOSIS — I509 Heart failure, unspecified: Secondary | ICD-10-CM | POA: Diagnosis not present

## 2015-07-06 DIAGNOSIS — J441 Chronic obstructive pulmonary disease with (acute) exacerbation: Secondary | ICD-10-CM | POA: Diagnosis not present

## 2015-07-06 DIAGNOSIS — Z9181 History of falling: Secondary | ICD-10-CM | POA: Diagnosis not present

## 2015-07-06 DIAGNOSIS — J9691 Respiratory failure, unspecified with hypoxia: Secondary | ICD-10-CM | POA: Diagnosis not present

## 2015-07-06 DIAGNOSIS — R531 Weakness: Secondary | ICD-10-CM | POA: Diagnosis not present

## 2015-07-07 DIAGNOSIS — I509 Heart failure, unspecified: Secondary | ICD-10-CM | POA: Diagnosis not present

## 2015-07-07 DIAGNOSIS — R531 Weakness: Secondary | ICD-10-CM | POA: Diagnosis not present

## 2015-07-07 DIAGNOSIS — Z9181 History of falling: Secondary | ICD-10-CM | POA: Diagnosis not present

## 2015-07-07 DIAGNOSIS — Z9981 Dependence on supplemental oxygen: Secondary | ICD-10-CM | POA: Diagnosis not present

## 2015-07-07 DIAGNOSIS — J9691 Respiratory failure, unspecified with hypoxia: Secondary | ICD-10-CM | POA: Diagnosis not present

## 2015-07-07 DIAGNOSIS — J441 Chronic obstructive pulmonary disease with (acute) exacerbation: Secondary | ICD-10-CM | POA: Diagnosis not present

## 2015-07-09 DIAGNOSIS — I509 Heart failure, unspecified: Secondary | ICD-10-CM | POA: Diagnosis not present

## 2015-07-09 DIAGNOSIS — J441 Chronic obstructive pulmonary disease with (acute) exacerbation: Secondary | ICD-10-CM | POA: Diagnosis not present

## 2015-07-09 DIAGNOSIS — Z9981 Dependence on supplemental oxygen: Secondary | ICD-10-CM | POA: Diagnosis not present

## 2015-07-09 DIAGNOSIS — J9691 Respiratory failure, unspecified with hypoxia: Secondary | ICD-10-CM | POA: Diagnosis not present

## 2015-07-09 DIAGNOSIS — Z9181 History of falling: Secondary | ICD-10-CM | POA: Diagnosis not present

## 2015-07-09 DIAGNOSIS — R531 Weakness: Secondary | ICD-10-CM | POA: Diagnosis not present

## 2015-07-12 DIAGNOSIS — R531 Weakness: Secondary | ICD-10-CM | POA: Diagnosis not present

## 2015-07-12 DIAGNOSIS — I509 Heart failure, unspecified: Secondary | ICD-10-CM | POA: Diagnosis not present

## 2015-07-12 DIAGNOSIS — J9691 Respiratory failure, unspecified with hypoxia: Secondary | ICD-10-CM | POA: Diagnosis not present

## 2015-07-12 DIAGNOSIS — Z9181 History of falling: Secondary | ICD-10-CM | POA: Diagnosis not present

## 2015-07-12 DIAGNOSIS — J441 Chronic obstructive pulmonary disease with (acute) exacerbation: Secondary | ICD-10-CM | POA: Diagnosis not present

## 2015-07-12 DIAGNOSIS — Z9981 Dependence on supplemental oxygen: Secondary | ICD-10-CM | POA: Diagnosis not present

## 2015-07-14 DIAGNOSIS — I509 Heart failure, unspecified: Secondary | ICD-10-CM | POA: Diagnosis not present

## 2015-07-14 DIAGNOSIS — Z9981 Dependence on supplemental oxygen: Secondary | ICD-10-CM | POA: Diagnosis not present

## 2015-07-14 DIAGNOSIS — Z9181 History of falling: Secondary | ICD-10-CM | POA: Diagnosis not present

## 2015-07-14 DIAGNOSIS — R531 Weakness: Secondary | ICD-10-CM | POA: Diagnosis not present

## 2015-07-14 DIAGNOSIS — J9691 Respiratory failure, unspecified with hypoxia: Secondary | ICD-10-CM | POA: Diagnosis not present

## 2015-07-14 DIAGNOSIS — J441 Chronic obstructive pulmonary disease with (acute) exacerbation: Secondary | ICD-10-CM | POA: Diagnosis not present

## 2015-07-15 ENCOUNTER — Ambulatory Visit: Payer: Medicare Other | Admitting: General Surgery

## 2015-07-15 DIAGNOSIS — J441 Chronic obstructive pulmonary disease with (acute) exacerbation: Secondary | ICD-10-CM | POA: Diagnosis not present

## 2015-07-15 DIAGNOSIS — R531 Weakness: Secondary | ICD-10-CM | POA: Diagnosis not present

## 2015-07-15 DIAGNOSIS — I509 Heart failure, unspecified: Secondary | ICD-10-CM | POA: Diagnosis not present

## 2015-07-15 DIAGNOSIS — Z9981 Dependence on supplemental oxygen: Secondary | ICD-10-CM | POA: Diagnosis not present

## 2015-07-15 DIAGNOSIS — Z9181 History of falling: Secondary | ICD-10-CM | POA: Diagnosis not present

## 2015-07-15 DIAGNOSIS — J9691 Respiratory failure, unspecified with hypoxia: Secondary | ICD-10-CM | POA: Diagnosis not present

## 2015-07-16 DIAGNOSIS — J441 Chronic obstructive pulmonary disease with (acute) exacerbation: Secondary | ICD-10-CM | POA: Diagnosis not present

## 2015-07-16 DIAGNOSIS — I509 Heart failure, unspecified: Secondary | ICD-10-CM | POA: Diagnosis not present

## 2015-07-16 DIAGNOSIS — R531 Weakness: Secondary | ICD-10-CM | POA: Diagnosis not present

## 2015-07-16 DIAGNOSIS — Z9981 Dependence on supplemental oxygen: Secondary | ICD-10-CM | POA: Diagnosis not present

## 2015-07-16 DIAGNOSIS — Z9181 History of falling: Secondary | ICD-10-CM | POA: Diagnosis not present

## 2015-07-16 DIAGNOSIS — J9691 Respiratory failure, unspecified with hypoxia: Secondary | ICD-10-CM | POA: Diagnosis not present

## 2015-07-19 DIAGNOSIS — I509 Heart failure, unspecified: Secondary | ICD-10-CM | POA: Diagnosis not present

## 2015-07-19 DIAGNOSIS — Z9181 History of falling: Secondary | ICD-10-CM | POA: Diagnosis not present

## 2015-07-19 DIAGNOSIS — J441 Chronic obstructive pulmonary disease with (acute) exacerbation: Secondary | ICD-10-CM | POA: Diagnosis not present

## 2015-07-19 DIAGNOSIS — J9691 Respiratory failure, unspecified with hypoxia: Secondary | ICD-10-CM | POA: Diagnosis not present

## 2015-07-19 DIAGNOSIS — R531 Weakness: Secondary | ICD-10-CM | POA: Diagnosis not present

## 2015-07-19 DIAGNOSIS — Z9981 Dependence on supplemental oxygen: Secondary | ICD-10-CM | POA: Diagnosis not present

## 2015-07-21 DIAGNOSIS — J441 Chronic obstructive pulmonary disease with (acute) exacerbation: Secondary | ICD-10-CM | POA: Diagnosis not present

## 2015-07-21 DIAGNOSIS — Z9181 History of falling: Secondary | ICD-10-CM | POA: Diagnosis not present

## 2015-07-21 DIAGNOSIS — J9691 Respiratory failure, unspecified with hypoxia: Secondary | ICD-10-CM | POA: Diagnosis not present

## 2015-07-21 DIAGNOSIS — I509 Heart failure, unspecified: Secondary | ICD-10-CM | POA: Diagnosis not present

## 2015-07-21 DIAGNOSIS — Z9981 Dependence on supplemental oxygen: Secondary | ICD-10-CM | POA: Diagnosis not present

## 2015-07-21 DIAGNOSIS — R531 Weakness: Secondary | ICD-10-CM | POA: Diagnosis not present

## 2015-07-23 DIAGNOSIS — J441 Chronic obstructive pulmonary disease with (acute) exacerbation: Secondary | ICD-10-CM | POA: Diagnosis not present

## 2015-07-23 DIAGNOSIS — Z9181 History of falling: Secondary | ICD-10-CM | POA: Diagnosis not present

## 2015-07-23 DIAGNOSIS — J9691 Respiratory failure, unspecified with hypoxia: Secondary | ICD-10-CM | POA: Diagnosis not present

## 2015-07-23 DIAGNOSIS — R531 Weakness: Secondary | ICD-10-CM | POA: Diagnosis not present

## 2015-07-23 DIAGNOSIS — I509 Heart failure, unspecified: Secondary | ICD-10-CM | POA: Diagnosis not present

## 2015-07-23 DIAGNOSIS — Z9981 Dependence on supplemental oxygen: Secondary | ICD-10-CM | POA: Diagnosis not present

## 2015-07-26 DIAGNOSIS — Z9181 History of falling: Secondary | ICD-10-CM | POA: Diagnosis not present

## 2015-07-26 DIAGNOSIS — J9691 Respiratory failure, unspecified with hypoxia: Secondary | ICD-10-CM | POA: Diagnosis not present

## 2015-07-26 DIAGNOSIS — I509 Heart failure, unspecified: Secondary | ICD-10-CM | POA: Diagnosis not present

## 2015-07-26 DIAGNOSIS — Z9981 Dependence on supplemental oxygen: Secondary | ICD-10-CM | POA: Diagnosis not present

## 2015-07-26 DIAGNOSIS — R531 Weakness: Secondary | ICD-10-CM | POA: Diagnosis not present

## 2015-07-26 DIAGNOSIS — J441 Chronic obstructive pulmonary disease with (acute) exacerbation: Secondary | ICD-10-CM | POA: Diagnosis not present

## 2015-07-27 ENCOUNTER — Ambulatory Visit: Payer: Medicare Other | Admitting: General Surgery

## 2015-07-28 DIAGNOSIS — R531 Weakness: Secondary | ICD-10-CM | POA: Diagnosis not present

## 2015-07-28 DIAGNOSIS — J441 Chronic obstructive pulmonary disease with (acute) exacerbation: Secondary | ICD-10-CM | POA: Diagnosis not present

## 2015-07-28 DIAGNOSIS — Z9981 Dependence on supplemental oxygen: Secondary | ICD-10-CM | POA: Diagnosis not present

## 2015-07-28 DIAGNOSIS — Z9181 History of falling: Secondary | ICD-10-CM | POA: Diagnosis not present

## 2015-07-28 DIAGNOSIS — I509 Heart failure, unspecified: Secondary | ICD-10-CM | POA: Diagnosis not present

## 2015-07-28 DIAGNOSIS — J9691 Respiratory failure, unspecified with hypoxia: Secondary | ICD-10-CM | POA: Diagnosis not present

## 2015-07-29 DIAGNOSIS — I509 Heart failure, unspecified: Secondary | ICD-10-CM | POA: Diagnosis not present

## 2015-07-29 DIAGNOSIS — J441 Chronic obstructive pulmonary disease with (acute) exacerbation: Secondary | ICD-10-CM | POA: Diagnosis not present

## 2015-07-29 DIAGNOSIS — Z9181 History of falling: Secondary | ICD-10-CM | POA: Diagnosis not present

## 2015-07-29 DIAGNOSIS — Z9981 Dependence on supplemental oxygen: Secondary | ICD-10-CM | POA: Diagnosis not present

## 2015-07-29 DIAGNOSIS — R531 Weakness: Secondary | ICD-10-CM | POA: Diagnosis not present

## 2015-07-29 DIAGNOSIS — J9691 Respiratory failure, unspecified with hypoxia: Secondary | ICD-10-CM | POA: Diagnosis not present

## 2015-07-30 DIAGNOSIS — J441 Chronic obstructive pulmonary disease with (acute) exacerbation: Secondary | ICD-10-CM | POA: Diagnosis not present

## 2015-07-30 DIAGNOSIS — R531 Weakness: Secondary | ICD-10-CM | POA: Diagnosis not present

## 2015-07-30 DIAGNOSIS — Z9181 History of falling: Secondary | ICD-10-CM | POA: Diagnosis not present

## 2015-07-30 DIAGNOSIS — Z9981 Dependence on supplemental oxygen: Secondary | ICD-10-CM | POA: Diagnosis not present

## 2015-07-30 DIAGNOSIS — J9691 Respiratory failure, unspecified with hypoxia: Secondary | ICD-10-CM | POA: Diagnosis not present

## 2015-07-30 DIAGNOSIS — I509 Heart failure, unspecified: Secondary | ICD-10-CM | POA: Diagnosis not present

## 2015-08-02 DIAGNOSIS — J9691 Respiratory failure, unspecified with hypoxia: Secondary | ICD-10-CM | POA: Diagnosis not present

## 2015-08-02 DIAGNOSIS — Z9981 Dependence on supplemental oxygen: Secondary | ICD-10-CM | POA: Diagnosis not present

## 2015-08-02 DIAGNOSIS — J441 Chronic obstructive pulmonary disease with (acute) exacerbation: Secondary | ICD-10-CM | POA: Diagnosis not present

## 2015-08-02 DIAGNOSIS — I509 Heart failure, unspecified: Secondary | ICD-10-CM | POA: Diagnosis not present

## 2015-08-02 DIAGNOSIS — Z9181 History of falling: Secondary | ICD-10-CM | POA: Diagnosis not present

## 2015-08-02 DIAGNOSIS — R531 Weakness: Secondary | ICD-10-CM | POA: Diagnosis not present

## 2015-08-03 DIAGNOSIS — Z9981 Dependence on supplemental oxygen: Secondary | ICD-10-CM | POA: Diagnosis not present

## 2015-08-03 DIAGNOSIS — I509 Heart failure, unspecified: Secondary | ICD-10-CM | POA: Diagnosis not present

## 2015-08-03 DIAGNOSIS — R531 Weakness: Secondary | ICD-10-CM | POA: Diagnosis not present

## 2015-08-03 DIAGNOSIS — J9691 Respiratory failure, unspecified with hypoxia: Secondary | ICD-10-CM | POA: Diagnosis not present

## 2015-08-03 DIAGNOSIS — Z9181 History of falling: Secondary | ICD-10-CM | POA: Diagnosis not present

## 2015-08-03 DIAGNOSIS — J441 Chronic obstructive pulmonary disease with (acute) exacerbation: Secondary | ICD-10-CM | POA: Diagnosis not present

## 2015-08-04 DIAGNOSIS — J9691 Respiratory failure, unspecified with hypoxia: Secondary | ICD-10-CM | POA: Diagnosis not present

## 2015-08-04 DIAGNOSIS — J441 Chronic obstructive pulmonary disease with (acute) exacerbation: Secondary | ICD-10-CM | POA: Diagnosis not present

## 2015-08-04 DIAGNOSIS — I509 Heart failure, unspecified: Secondary | ICD-10-CM | POA: Diagnosis not present

## 2015-08-04 DIAGNOSIS — Z9181 History of falling: Secondary | ICD-10-CM | POA: Diagnosis not present

## 2015-08-04 DIAGNOSIS — Z9981 Dependence on supplemental oxygen: Secondary | ICD-10-CM | POA: Diagnosis not present

## 2015-08-04 DIAGNOSIS — N186 End stage renal disease: Secondary | ICD-10-CM | POA: Diagnosis not present

## 2015-08-04 DIAGNOSIS — Z992 Dependence on renal dialysis: Secondary | ICD-10-CM | POA: Diagnosis not present

## 2015-08-04 DIAGNOSIS — R531 Weakness: Secondary | ICD-10-CM | POA: Diagnosis not present

## 2015-08-05 DIAGNOSIS — Z992 Dependence on renal dialysis: Secondary | ICD-10-CM | POA: Diagnosis not present

## 2015-08-05 DIAGNOSIS — N186 End stage renal disease: Secondary | ICD-10-CM | POA: Diagnosis not present

## 2015-08-05 DIAGNOSIS — R627 Adult failure to thrive: Secondary | ICD-10-CM | POA: Diagnosis not present

## 2015-08-05 DIAGNOSIS — R109 Unspecified abdominal pain: Secondary | ICD-10-CM | POA: Diagnosis not present

## 2015-08-05 DIAGNOSIS — R531 Weakness: Secondary | ICD-10-CM | POA: Diagnosis not present

## 2015-08-05 DIAGNOSIS — Z853 Personal history of malignant neoplasm of breast: Secondary | ICD-10-CM | POA: Diagnosis not present

## 2015-08-05 DIAGNOSIS — Z9981 Dependence on supplemental oxygen: Secondary | ICD-10-CM | POA: Diagnosis not present

## 2015-08-05 DIAGNOSIS — J9691 Respiratory failure, unspecified with hypoxia: Secondary | ICD-10-CM | POA: Diagnosis not present

## 2015-08-05 DIAGNOSIS — D509 Iron deficiency anemia, unspecified: Secondary | ICD-10-CM | POA: Diagnosis not present

## 2015-08-05 DIAGNOSIS — J441 Chronic obstructive pulmonary disease with (acute) exacerbation: Secondary | ICD-10-CM | POA: Diagnosis not present

## 2015-08-05 DIAGNOSIS — R197 Diarrhea, unspecified: Secondary | ICD-10-CM | POA: Diagnosis not present

## 2015-08-05 DIAGNOSIS — Z9181 History of falling: Secondary | ICD-10-CM | POA: Diagnosis not present

## 2015-08-05 DIAGNOSIS — D631 Anemia in chronic kidney disease: Secondary | ICD-10-CM | POA: Diagnosis not present

## 2015-08-05 DIAGNOSIS — I509 Heart failure, unspecified: Secondary | ICD-10-CM | POA: Diagnosis not present

## 2015-08-05 DIAGNOSIS — Z923 Personal history of irradiation: Secondary | ICD-10-CM | POA: Diagnosis not present

## 2015-08-05 DIAGNOSIS — Z812 Family history of tobacco abuse and dependence: Secondary | ICD-10-CM | POA: Diagnosis not present

## 2015-08-06 DIAGNOSIS — I509 Heart failure, unspecified: Secondary | ICD-10-CM | POA: Diagnosis not present

## 2015-08-06 DIAGNOSIS — R531 Weakness: Secondary | ICD-10-CM | POA: Diagnosis not present

## 2015-08-06 DIAGNOSIS — Z9981 Dependence on supplemental oxygen: Secondary | ICD-10-CM | POA: Diagnosis not present

## 2015-08-06 DIAGNOSIS — J9691 Respiratory failure, unspecified with hypoxia: Secondary | ICD-10-CM | POA: Diagnosis not present

## 2015-08-06 DIAGNOSIS — Z9181 History of falling: Secondary | ICD-10-CM | POA: Diagnosis not present

## 2015-08-06 DIAGNOSIS — J441 Chronic obstructive pulmonary disease with (acute) exacerbation: Secondary | ICD-10-CM | POA: Diagnosis not present

## 2015-08-09 ENCOUNTER — Ambulatory Visit: Payer: Medicare Other | Attending: Family | Admitting: Family

## 2015-08-09 ENCOUNTER — Encounter: Payer: Self-pay | Admitting: Family

## 2015-08-09 VITALS — BP 99/64 | HR 99 | Resp 18 | Ht 67.0 in | Wt 132.0 lb

## 2015-08-09 DIAGNOSIS — M1990 Unspecified osteoarthritis, unspecified site: Secondary | ICD-10-CM | POA: Insufficient documentation

## 2015-08-09 DIAGNOSIS — I5022 Chronic systolic (congestive) heart failure: Secondary | ICD-10-CM | POA: Diagnosis not present

## 2015-08-09 DIAGNOSIS — J449 Chronic obstructive pulmonary disease, unspecified: Secondary | ICD-10-CM | POA: Diagnosis not present

## 2015-08-09 DIAGNOSIS — J9691 Respiratory failure, unspecified with hypoxia: Secondary | ICD-10-CM | POA: Diagnosis not present

## 2015-08-09 DIAGNOSIS — Z881 Allergy status to other antibiotic agents status: Secondary | ICD-10-CM | POA: Diagnosis not present

## 2015-08-09 DIAGNOSIS — Z86711 Personal history of pulmonary embolism: Secondary | ICD-10-CM | POA: Insufficient documentation

## 2015-08-09 DIAGNOSIS — F419 Anxiety disorder, unspecified: Secondary | ICD-10-CM | POA: Diagnosis not present

## 2015-08-09 DIAGNOSIS — Z882 Allergy status to sulfonamides status: Secondary | ICD-10-CM | POA: Insufficient documentation

## 2015-08-09 DIAGNOSIS — Z87891 Personal history of nicotine dependence: Secondary | ICD-10-CM | POA: Diagnosis not present

## 2015-08-09 DIAGNOSIS — I132 Hypertensive heart and chronic kidney disease with heart failure and with stage 5 chronic kidney disease, or end stage renal disease: Secondary | ICD-10-CM | POA: Insufficient documentation

## 2015-08-09 DIAGNOSIS — Z992 Dependence on renal dialysis: Secondary | ICD-10-CM | POA: Insufficient documentation

## 2015-08-09 DIAGNOSIS — R531 Weakness: Secondary | ICD-10-CM | POA: Diagnosis not present

## 2015-08-09 DIAGNOSIS — Z885 Allergy status to narcotic agent status: Secondary | ICD-10-CM | POA: Diagnosis not present

## 2015-08-09 DIAGNOSIS — I950 Idiopathic hypotension: Secondary | ICD-10-CM

## 2015-08-09 DIAGNOSIS — K219 Gastro-esophageal reflux disease without esophagitis: Secondary | ICD-10-CM | POA: Diagnosis not present

## 2015-08-09 DIAGNOSIS — Z880 Allergy status to penicillin: Secondary | ICD-10-CM | POA: Diagnosis not present

## 2015-08-09 DIAGNOSIS — Z87442 Personal history of urinary calculi: Secondary | ICD-10-CM | POA: Insufficient documentation

## 2015-08-09 DIAGNOSIS — Z888 Allergy status to other drugs, medicaments and biological substances status: Secondary | ICD-10-CM | POA: Insufficient documentation

## 2015-08-09 DIAGNOSIS — Z9081 Acquired absence of spleen: Secondary | ICD-10-CM | POA: Diagnosis not present

## 2015-08-09 DIAGNOSIS — Z7982 Long term (current) use of aspirin: Secondary | ICD-10-CM | POA: Insufficient documentation

## 2015-08-09 DIAGNOSIS — Z8249 Family history of ischemic heart disease and other diseases of the circulatory system: Secondary | ICD-10-CM | POA: Insufficient documentation

## 2015-08-09 DIAGNOSIS — Z91030 Bee allergy status: Secondary | ICD-10-CM | POA: Diagnosis not present

## 2015-08-09 DIAGNOSIS — Z96652 Presence of left artificial knee joint: Secondary | ICD-10-CM | POA: Insufficient documentation

## 2015-08-09 DIAGNOSIS — R42 Dizziness and giddiness: Secondary | ICD-10-CM | POA: Diagnosis not present

## 2015-08-09 DIAGNOSIS — Z96642 Presence of left artificial hip joint: Secondary | ICD-10-CM | POA: Insufficient documentation

## 2015-08-09 DIAGNOSIS — Z9181 History of falling: Secondary | ICD-10-CM | POA: Diagnosis not present

## 2015-08-09 DIAGNOSIS — N186 End stage renal disease: Secondary | ICD-10-CM | POA: Insufficient documentation

## 2015-08-09 DIAGNOSIS — Z9981 Dependence on supplemental oxygen: Secondary | ICD-10-CM | POA: Diagnosis not present

## 2015-08-09 DIAGNOSIS — Z853 Personal history of malignant neoplasm of breast: Secondary | ICD-10-CM | POA: Insufficient documentation

## 2015-08-09 DIAGNOSIS — I959 Hypotension, unspecified: Secondary | ICD-10-CM | POA: Insufficient documentation

## 2015-08-09 DIAGNOSIS — J441 Chronic obstructive pulmonary disease with (acute) exacerbation: Secondary | ICD-10-CM | POA: Diagnosis not present

## 2015-08-09 DIAGNOSIS — I509 Heart failure, unspecified: Secondary | ICD-10-CM | POA: Diagnosis not present

## 2015-08-09 NOTE — Progress Notes (Signed)
Subjective:    Patient ID: Lydia Clark, female    DOB: [birth year], 75 y.o.   MRN: [id number]  Congestive Heart Failure Presents for follow-up visit. The disease course has been stable. Associated symptoms include fatigue. Pertinent negatives include no abdominal pain, chest pain, chest pressure, edema, orthopnea, palpitations or shortness of breath. The symptoms have been stable. Past treatments include salt and fluid restriction and oxygen. The treatment provided moderate relief. Compliance with prior treatments has been good. Her past medical history is significant for chronic lung disease, HTN and PE. There is no history of CVA or DM. She has one 1st degree relative with heart disease.  Other This is a chronic (peritoneal dialysis) problem. The current episode started more than 1 year ago. The problem occurs daily. The problem has been unchanged. Associated symptoms include fatigue and weakness. Pertinent negatives include no abdominal pain, chest pain, congestion, coughing, headaches, neck pain or sore throat. The treatment provided moderate relief.   Past Medical History  Diagnosis Date  . Allergy   . Cancer [facility]) 2008    left breast  . Hypertension 2006  . Personal history of tobacco use, presenting hazards to health 2012    30 yrs smoking  . Endocrine disorder 2008    thyroid  . Personal history of malignant neoplasm of breast 2008  . Breast screening, unspecified 2013  . Special screening for malignant neoplasms, colon 2013  . COPD (chronic obstructive pulmonary disease) ([place])   . PE (pulmonary embolism)     patient denies  . Swelling of throat     swelling at base of throat,  . Anxiety     anxiety  . Pneumonia     hx  . Heart murmur     child  . History of kidney stones   . GERD (gastroesophageal reflux disease)   . Arthritis   . Multinodular goiter     followed by Dr. Carloyn Manner @ [facility]  . Dialysis patient ([place]) 2014  . Chronic kidney disease 2014    stage IV chronic   . CHF (congestive heart failure) [facility])     Past Surgical History  Procedure Laterality Date  . Breast surgery Left 2008    lumpectomy  . Fooy Left     lft foot  . Foot surgery  2005  . Splenectomy, total  1956    not sure if partial or total  . Tonsillectomy  1950  . Breast biopsy  2005  . Appendectomy  2005  . Replacement total knee Left 2012    Partial   . Colonoscopy  2007    done in [place]  . Insertion of dialysis catheter Right 07/02/2012    Procedure: INSERTION OF DIALYSIS CATHETER;  Surgeon: Elam Dutch, MD;  Location: [place];  Service: Vascular;  Laterality: Right;  Right Internal Jugular  . Insertion of dialysis catheter Right July 02 2012    right chest/ temporary cath  . Hip arthroplasty Left 01/10/2015    Procedure: ARTHROPLASTY BIPOLAR HIP (HEMIARTHROPLASTY);  Surgeon: Claud Kelp, MD;  Location: [facility];  Service: Orthopedics;  Laterality: Left;  . Peripheral vascular catheterization N/A 01/13/2015    Procedure: Dialysis/Perma Catheter Insertion;  Surgeon: Algernon Huxley, MD;  Location: [place] [facility];  Service: Cardiovascular;  Laterality: N/A;    Family History  Problem Relation Age of Onset  . Heart attack Father   . Pneumonia Father   . Breast cancer Maternal Aunt   .  Breast cancer Paternal Aunt     Social History  Substance Use Topics  . Smoking status: Former Smoker -- 0.30 packs/day for 30 years    Types: Cigarettes    Quit date: 01/10/2015  . Smokeless tobacco: Never Used  . Alcohol Use: No    Allergies  Allergen Reactions  . Bee Venom Swelling and Other (See Comments)    Breathing problems  . Biaxin [Clarithromycin] Other (See Comments)    Throat swells  . Hydrocodone Nausea And Vomiting  . Mycostatin [Nystatin] Other (See Comments)    Blisters from the ointment  . Polysorbate     Other reaction(s): Unknown  . Sulfa Antibiotics Other (See Comments)    "welps"  . Penicillins Rash    Prior to Admission  medications   Medication Sig Start Date End Date Taking? Authorizing Provider  albuterol (PROVENTIL HFA;VENTOLIN HFA) 108 (90 BASE) MCG/ACT inhaler Inhale 2 puffs into the lungs every 6 (six) hours as needed for wheezing or shortness of breath.   Yes Historical Provider, MD  amitriptyline (ELAVIL) 25 MG tablet Take 25 mg by mouth at bedtime.   Yes Historical Provider, MD  aspirin 81 MG tablet Take 81 mg by mouth daily.   Yes Historical Provider, MD  calcium acetate (PHOSLO) 667 MG capsule Take 667 mg by mouth 3 (three) times daily with meals.   Yes Historical Provider, MD  feeding supplement, ENSURE ENLIVE, (ENSURE ENLIVE) LIQD Take 237 mLs by mouth 2 (two) times daily between meals. 01/14/15  Yes Dustin Flock, MD  fexofenadine (ALLEGRA) 60 MG tablet Take 60 mg by mouth daily.   Yes Historical Provider, MD  Fluticasone-Salmeterol (ADVAIR DISKUS) 250-50 MCG/DOSE AEPB Inhale 1 puff into the lungs every 12 (twelve) hours. 03/03/15 03/02/16 Yes Laverle Hobby, MD  furosemide (LASIX) 40 MG tablet Take 40 mg by mouth daily. 12/31/14  Yes Historical Provider, MD  omeprazole (PRILOSEC) 20 MG capsule Take 20 mg by mouth daily.   Yes Historical Provider, MD  PROTEIN PO Take 1 Bottle by mouth every other day.    Yes Historical Provider, MD  tiotropium (SPIRIVA) 18 MCG inhalation capsule Place 1 capsule (18 mcg total) into inhaler and inhale at bedtime. 03/03/15  Yes Laverle Hobby, MD  traZODone (DESYREL) 50 MG tablet Take 25 mg by mouth at bedtime.   Yes Historical Provider, MD  zolpidem (AMBIEN) 5 MG tablet Take 5 mg by mouth at bedtime as needed for sleep.   Yes Historical Provider, MD      Review of Systems  Constitutional: Positive for fatigue. Negative for appetite change.  HENT: Negative for congestion, postnasal drip and sore throat.   Eyes: Negative.   Respiratory: Negative for cough, chest tightness and shortness of breath.   Cardiovascular: Negative for chest pain, palpitations  and leg swelling.  Gastrointestinal: Negative for abdominal pain and abdominal distention.  Endocrine: Negative.   Genitourinary: Negative.   Musculoskeletal: Negative for back pain and neck pain.  Skin: Negative.   Allergic/Immunologic: Negative.   Neurological: Positive for weakness and light-headedness. Negative for dizziness and headaches.  Hematological: Negative for adenopathy. Does not bruise/bleed easily.  Psychiatric/Behavioral: Negative for sleep disturbance (wearing oxygen at 2L around the clock) and dysphoric mood. The patient is not nervous/anxious.        Objective:   Physical Exam  Constitutional: She is oriented to person, place, and time. She appears well-developed.  HENT:  Head: Normocephalic and atraumatic.  Eyes: Conjunctivae are normal. Pupils are equal, round, and reactive  to light.  Neck: Normal range of motion. Neck supple.  Cardiovascular: Regular rhythm.  Tachycardia present.   Pulmonary/Chest: Effort normal. She has no wheezes. She has no rales.  Abdominal: Soft. She exhibits no distension. There is no tenderness.  Musculoskeletal: She exhibits no edema or tenderness.  Neurological: She is alert and oriented to person, place, and time.  Skin: Skin is warm and dry.  Psychiatric: She has a normal mood and affect. Her behavior is normal. Thought content normal.  Nursing note and vitals reviewed.  BP 99/64 mmHg  Pulse 99  Resp 18  Ht 5\' 7"  (1.702 m)  Wt 132 lb (59.875 kg)  BMI 20.67 kg/m2  SpO2 96%        Assessment & Plan:  1: Chronic heart failure with reduced ejection fraction- Patient presents with fatigue with little exertion (Class III). She admits that she really doesn't do anything at home but is able to walk around with her walker. She denies getting short of breath nor does she have any swelling in her legs or abdomen. She continues to weigh herself and her son says that it's been pretty stable. By our scale, she's gained 2 pounds since she  was last here on 05/07/15. The nephrologist has her drinking a high protein shake every other day to supplement her diet. Reminded them to call for an overnight weight gain of >2 pounds or a weekly weight gain of >5 pounds. No salt is being added to her food.  2: Hypotension- Blood pressure has been dropping so the nephrologist has stopped her valsartan. She does endorse some light-headedness with position changes and she was encouraged to change positions slowly. 3: COPD- Appears to be stable at this time. Continues to use her inhalers and wear her oxygen at 2L around the clock. 4: ESRD- Currently is doing nightly peritoneal dialysis at home and her son is very involved in her care. Follows closely with nephrology in this regard.   Medication bottles were reviewed.  Return here in 6 months or sooner for any questions/problems before then.

## 2015-08-09 NOTE — Patient Instructions (Signed)
Continue weighing daily and call for an overnight weight gain of > 2 pounds or a weekly weight gain of >5 pounds. 

## 2015-08-11 DIAGNOSIS — Z9181 History of falling: Secondary | ICD-10-CM | POA: Diagnosis not present

## 2015-08-11 DIAGNOSIS — Z9981 Dependence on supplemental oxygen: Secondary | ICD-10-CM | POA: Diagnosis not present

## 2015-08-11 DIAGNOSIS — I509 Heart failure, unspecified: Secondary | ICD-10-CM | POA: Diagnosis not present

## 2015-08-11 DIAGNOSIS — R531 Weakness: Secondary | ICD-10-CM | POA: Diagnosis not present

## 2015-08-11 DIAGNOSIS — J9691 Respiratory failure, unspecified with hypoxia: Secondary | ICD-10-CM | POA: Diagnosis not present

## 2015-08-11 DIAGNOSIS — J441 Chronic obstructive pulmonary disease with (acute) exacerbation: Secondary | ICD-10-CM | POA: Diagnosis not present

## 2015-08-12 DIAGNOSIS — Z9981 Dependence on supplemental oxygen: Secondary | ICD-10-CM | POA: Diagnosis not present

## 2015-08-12 DIAGNOSIS — I509 Heart failure, unspecified: Secondary | ICD-10-CM | POA: Diagnosis not present

## 2015-08-12 DIAGNOSIS — J441 Chronic obstructive pulmonary disease with (acute) exacerbation: Secondary | ICD-10-CM | POA: Diagnosis not present

## 2015-08-12 DIAGNOSIS — Z9181 History of falling: Secondary | ICD-10-CM | POA: Diagnosis not present

## 2015-08-12 DIAGNOSIS — R531 Weakness: Secondary | ICD-10-CM | POA: Diagnosis not present

## 2015-08-12 DIAGNOSIS — J9691 Respiratory failure, unspecified with hypoxia: Secondary | ICD-10-CM | POA: Diagnosis not present

## 2015-08-13 DIAGNOSIS — I509 Heart failure, unspecified: Secondary | ICD-10-CM | POA: Diagnosis not present

## 2015-08-13 DIAGNOSIS — J441 Chronic obstructive pulmonary disease with (acute) exacerbation: Secondary | ICD-10-CM | POA: Diagnosis not present

## 2015-08-13 DIAGNOSIS — Z9181 History of falling: Secondary | ICD-10-CM | POA: Diagnosis not present

## 2015-08-13 DIAGNOSIS — J9691 Respiratory failure, unspecified with hypoxia: Secondary | ICD-10-CM | POA: Diagnosis not present

## 2015-08-13 DIAGNOSIS — Z9981 Dependence on supplemental oxygen: Secondary | ICD-10-CM | POA: Diagnosis not present

## 2015-08-13 DIAGNOSIS — R531 Weakness: Secondary | ICD-10-CM | POA: Diagnosis not present

## 2015-08-16 DIAGNOSIS — R531 Weakness: Secondary | ICD-10-CM | POA: Diagnosis not present

## 2015-08-16 DIAGNOSIS — Z9181 History of falling: Secondary | ICD-10-CM | POA: Diagnosis not present

## 2015-08-16 DIAGNOSIS — J9691 Respiratory failure, unspecified with hypoxia: Secondary | ICD-10-CM | POA: Diagnosis not present

## 2015-08-16 DIAGNOSIS — J441 Chronic obstructive pulmonary disease with (acute) exacerbation: Secondary | ICD-10-CM | POA: Diagnosis not present

## 2015-08-16 DIAGNOSIS — Z9981 Dependence on supplemental oxygen: Secondary | ICD-10-CM | POA: Diagnosis not present

## 2015-08-16 DIAGNOSIS — I509 Heart failure, unspecified: Secondary | ICD-10-CM | POA: Diagnosis not present

## 2015-08-18 DIAGNOSIS — I509 Heart failure, unspecified: Secondary | ICD-10-CM | POA: Diagnosis not present

## 2015-08-18 DIAGNOSIS — J9691 Respiratory failure, unspecified with hypoxia: Secondary | ICD-10-CM | POA: Diagnosis not present

## 2015-08-18 DIAGNOSIS — J441 Chronic obstructive pulmonary disease with (acute) exacerbation: Secondary | ICD-10-CM | POA: Diagnosis not present

## 2015-08-18 DIAGNOSIS — Z9981 Dependence on supplemental oxygen: Secondary | ICD-10-CM | POA: Diagnosis not present

## 2015-08-18 DIAGNOSIS — Z9181 History of falling: Secondary | ICD-10-CM | POA: Diagnosis not present

## 2015-08-18 DIAGNOSIS — R531 Weakness: Secondary | ICD-10-CM | POA: Diagnosis not present

## 2015-08-19 DIAGNOSIS — J441 Chronic obstructive pulmonary disease with (acute) exacerbation: Secondary | ICD-10-CM | POA: Diagnosis not present

## 2015-08-19 DIAGNOSIS — I509 Heart failure, unspecified: Secondary | ICD-10-CM | POA: Diagnosis not present

## 2015-08-19 DIAGNOSIS — Z9181 History of falling: Secondary | ICD-10-CM | POA: Diagnosis not present

## 2015-08-19 DIAGNOSIS — R531 Weakness: Secondary | ICD-10-CM | POA: Diagnosis not present

## 2015-08-19 DIAGNOSIS — Z9981 Dependence on supplemental oxygen: Secondary | ICD-10-CM | POA: Diagnosis not present

## 2015-08-19 DIAGNOSIS — J9691 Respiratory failure, unspecified with hypoxia: Secondary | ICD-10-CM | POA: Diagnosis not present

## 2015-08-20 DIAGNOSIS — R531 Weakness: Secondary | ICD-10-CM | POA: Diagnosis not present

## 2015-08-20 DIAGNOSIS — Z9181 History of falling: Secondary | ICD-10-CM | POA: Diagnosis not present

## 2015-08-20 DIAGNOSIS — I509 Heart failure, unspecified: Secondary | ICD-10-CM | POA: Diagnosis not present

## 2015-08-20 DIAGNOSIS — J441 Chronic obstructive pulmonary disease with (acute) exacerbation: Secondary | ICD-10-CM | POA: Diagnosis not present

## 2015-08-20 DIAGNOSIS — Z9981 Dependence on supplemental oxygen: Secondary | ICD-10-CM | POA: Diagnosis not present

## 2015-08-20 DIAGNOSIS — J9691 Respiratory failure, unspecified with hypoxia: Secondary | ICD-10-CM | POA: Diagnosis not present

## 2015-08-23 DIAGNOSIS — Z9181 History of falling: Secondary | ICD-10-CM | POA: Diagnosis not present

## 2015-08-23 DIAGNOSIS — J9691 Respiratory failure, unspecified with hypoxia: Secondary | ICD-10-CM | POA: Diagnosis not present

## 2015-08-23 DIAGNOSIS — I509 Heart failure, unspecified: Secondary | ICD-10-CM | POA: Diagnosis not present

## 2015-08-23 DIAGNOSIS — Z9981 Dependence on supplemental oxygen: Secondary | ICD-10-CM | POA: Diagnosis not present

## 2015-08-23 DIAGNOSIS — R531 Weakness: Secondary | ICD-10-CM | POA: Diagnosis not present

## 2015-08-23 DIAGNOSIS — J441 Chronic obstructive pulmonary disease with (acute) exacerbation: Secondary | ICD-10-CM | POA: Diagnosis not present

## 2015-08-25 DIAGNOSIS — R531 Weakness: Secondary | ICD-10-CM | POA: Diagnosis not present

## 2015-08-25 DIAGNOSIS — I509 Heart failure, unspecified: Secondary | ICD-10-CM | POA: Diagnosis not present

## 2015-08-25 DIAGNOSIS — J441 Chronic obstructive pulmonary disease with (acute) exacerbation: Secondary | ICD-10-CM | POA: Diagnosis not present

## 2015-08-25 DIAGNOSIS — Z9981 Dependence on supplemental oxygen: Secondary | ICD-10-CM | POA: Diagnosis not present

## 2015-08-25 DIAGNOSIS — J9691 Respiratory failure, unspecified with hypoxia: Secondary | ICD-10-CM | POA: Diagnosis not present

## 2015-08-25 DIAGNOSIS — Z9181 History of falling: Secondary | ICD-10-CM | POA: Diagnosis not present

## 2015-08-26 DIAGNOSIS — R531 Weakness: Secondary | ICD-10-CM | POA: Diagnosis not present

## 2015-08-26 DIAGNOSIS — J9691 Respiratory failure, unspecified with hypoxia: Secondary | ICD-10-CM | POA: Diagnosis not present

## 2015-08-26 DIAGNOSIS — Z9181 History of falling: Secondary | ICD-10-CM | POA: Diagnosis not present

## 2015-08-26 DIAGNOSIS — Z9981 Dependence on supplemental oxygen: Secondary | ICD-10-CM | POA: Diagnosis not present

## 2015-08-26 DIAGNOSIS — I509 Heart failure, unspecified: Secondary | ICD-10-CM | POA: Diagnosis not present

## 2015-08-26 DIAGNOSIS — J441 Chronic obstructive pulmonary disease with (acute) exacerbation: Secondary | ICD-10-CM | POA: Diagnosis not present

## 2015-08-27 DIAGNOSIS — J9691 Respiratory failure, unspecified with hypoxia: Secondary | ICD-10-CM | POA: Diagnosis not present

## 2015-08-27 DIAGNOSIS — Z9981 Dependence on supplemental oxygen: Secondary | ICD-10-CM | POA: Diagnosis not present

## 2015-08-27 DIAGNOSIS — Z9181 History of falling: Secondary | ICD-10-CM | POA: Diagnosis not present

## 2015-08-27 DIAGNOSIS — I509 Heart failure, unspecified: Secondary | ICD-10-CM | POA: Diagnosis not present

## 2015-08-27 DIAGNOSIS — J441 Chronic obstructive pulmonary disease with (acute) exacerbation: Secondary | ICD-10-CM | POA: Diagnosis not present

## 2015-08-27 DIAGNOSIS — R531 Weakness: Secondary | ICD-10-CM | POA: Diagnosis not present

## 2015-08-30 DIAGNOSIS — J441 Chronic obstructive pulmonary disease with (acute) exacerbation: Secondary | ICD-10-CM | POA: Diagnosis not present

## 2015-08-30 DIAGNOSIS — I509 Heart failure, unspecified: Secondary | ICD-10-CM | POA: Diagnosis not present

## 2015-08-30 DIAGNOSIS — Z9181 History of falling: Secondary | ICD-10-CM | POA: Diagnosis not present

## 2015-08-30 DIAGNOSIS — R531 Weakness: Secondary | ICD-10-CM | POA: Diagnosis not present

## 2015-08-30 DIAGNOSIS — Z9981 Dependence on supplemental oxygen: Secondary | ICD-10-CM | POA: Diagnosis not present

## 2015-08-30 DIAGNOSIS — J9691 Respiratory failure, unspecified with hypoxia: Secondary | ICD-10-CM | POA: Diagnosis not present

## 2015-09-01 DIAGNOSIS — Z9181 History of falling: Secondary | ICD-10-CM | POA: Diagnosis not present

## 2015-09-01 DIAGNOSIS — I509 Heart failure, unspecified: Secondary | ICD-10-CM | POA: Diagnosis not present

## 2015-09-01 DIAGNOSIS — Z9981 Dependence on supplemental oxygen: Secondary | ICD-10-CM | POA: Diagnosis not present

## 2015-09-01 DIAGNOSIS — J441 Chronic obstructive pulmonary disease with (acute) exacerbation: Secondary | ICD-10-CM | POA: Diagnosis not present

## 2015-09-01 DIAGNOSIS — R531 Weakness: Secondary | ICD-10-CM | POA: Diagnosis not present

## 2015-09-01 DIAGNOSIS — J9691 Respiratory failure, unspecified with hypoxia: Secondary | ICD-10-CM | POA: Diagnosis not present

## 2015-09-02 DIAGNOSIS — Z9981 Dependence on supplemental oxygen: Secondary | ICD-10-CM | POA: Diagnosis not present

## 2015-09-02 DIAGNOSIS — J9691 Respiratory failure, unspecified with hypoxia: Secondary | ICD-10-CM | POA: Diagnosis not present

## 2015-09-02 DIAGNOSIS — Z9181 History of falling: Secondary | ICD-10-CM | POA: Diagnosis not present

## 2015-09-02 DIAGNOSIS — I509 Heart failure, unspecified: Secondary | ICD-10-CM | POA: Diagnosis not present

## 2015-09-02 DIAGNOSIS — R531 Weakness: Secondary | ICD-10-CM | POA: Diagnosis not present

## 2015-09-02 DIAGNOSIS — J441 Chronic obstructive pulmonary disease with (acute) exacerbation: Secondary | ICD-10-CM | POA: Diagnosis not present

## 2015-09-03 DIAGNOSIS — Z992 Dependence on renal dialysis: Secondary | ICD-10-CM | POA: Diagnosis not present

## 2015-09-03 DIAGNOSIS — J441 Chronic obstructive pulmonary disease with (acute) exacerbation: Secondary | ICD-10-CM | POA: Diagnosis not present

## 2015-09-03 DIAGNOSIS — Z9181 History of falling: Secondary | ICD-10-CM | POA: Diagnosis not present

## 2015-09-03 DIAGNOSIS — Z9981 Dependence on supplemental oxygen: Secondary | ICD-10-CM | POA: Diagnosis not present

## 2015-09-03 DIAGNOSIS — I509 Heart failure, unspecified: Secondary | ICD-10-CM | POA: Diagnosis not present

## 2015-09-03 DIAGNOSIS — N186 End stage renal disease: Secondary | ICD-10-CM | POA: Diagnosis not present

## 2015-09-03 DIAGNOSIS — J9691 Respiratory failure, unspecified with hypoxia: Secondary | ICD-10-CM | POA: Diagnosis not present

## 2015-09-03 DIAGNOSIS — R531 Weakness: Secondary | ICD-10-CM | POA: Diagnosis not present

## 2015-09-04 DIAGNOSIS — D509 Iron deficiency anemia, unspecified: Secondary | ICD-10-CM | POA: Diagnosis not present

## 2015-09-04 DIAGNOSIS — J441 Chronic obstructive pulmonary disease with (acute) exacerbation: Secondary | ICD-10-CM | POA: Diagnosis not present

## 2015-09-04 DIAGNOSIS — Z9981 Dependence on supplemental oxygen: Secondary | ICD-10-CM | POA: Diagnosis not present

## 2015-09-04 DIAGNOSIS — Z992 Dependence on renal dialysis: Secondary | ICD-10-CM | POA: Diagnosis not present

## 2015-09-04 DIAGNOSIS — R531 Weakness: Secondary | ICD-10-CM | POA: Diagnosis not present

## 2015-09-04 DIAGNOSIS — Z812 Family history of tobacco abuse and dependence: Secondary | ICD-10-CM | POA: Diagnosis not present

## 2015-09-04 DIAGNOSIS — N186 End stage renal disease: Secondary | ICD-10-CM | POA: Diagnosis not present

## 2015-09-04 DIAGNOSIS — Z853 Personal history of malignant neoplasm of breast: Secondary | ICD-10-CM | POA: Diagnosis not present

## 2015-09-04 DIAGNOSIS — R627 Adult failure to thrive: Secondary | ICD-10-CM | POA: Diagnosis not present

## 2015-09-04 DIAGNOSIS — I509 Heart failure, unspecified: Secondary | ICD-10-CM | POA: Diagnosis not present

## 2015-09-04 DIAGNOSIS — D631 Anemia in chronic kidney disease: Secondary | ICD-10-CM | POA: Diagnosis not present

## 2015-09-04 DIAGNOSIS — N2581 Secondary hyperparathyroidism of renal origin: Secondary | ICD-10-CM | POA: Diagnosis not present

## 2015-09-04 DIAGNOSIS — J9691 Respiratory failure, unspecified with hypoxia: Secondary | ICD-10-CM | POA: Diagnosis not present

## 2015-09-04 DIAGNOSIS — Z9181 History of falling: Secondary | ICD-10-CM | POA: Diagnosis not present

## 2015-09-04 DIAGNOSIS — Z923 Personal history of irradiation: Secondary | ICD-10-CM | POA: Diagnosis not present

## 2015-09-05 DIAGNOSIS — D631 Anemia in chronic kidney disease: Secondary | ICD-10-CM | POA: Diagnosis not present

## 2015-09-05 DIAGNOSIS — N2581 Secondary hyperparathyroidism of renal origin: Secondary | ICD-10-CM | POA: Diagnosis not present

## 2015-09-05 DIAGNOSIS — D509 Iron deficiency anemia, unspecified: Secondary | ICD-10-CM | POA: Diagnosis not present

## 2015-09-05 DIAGNOSIS — N186 End stage renal disease: Secondary | ICD-10-CM | POA: Diagnosis not present

## 2015-09-05 DIAGNOSIS — Z992 Dependence on renal dialysis: Secondary | ICD-10-CM | POA: Diagnosis not present

## 2015-09-06 DIAGNOSIS — R531 Weakness: Secondary | ICD-10-CM | POA: Diagnosis not present

## 2015-09-06 DIAGNOSIS — D509 Iron deficiency anemia, unspecified: Secondary | ICD-10-CM | POA: Diagnosis not present

## 2015-09-06 DIAGNOSIS — Z9181 History of falling: Secondary | ICD-10-CM | POA: Diagnosis not present

## 2015-09-06 DIAGNOSIS — N2581 Secondary hyperparathyroidism of renal origin: Secondary | ICD-10-CM | POA: Diagnosis not present

## 2015-09-06 DIAGNOSIS — Z9981 Dependence on supplemental oxygen: Secondary | ICD-10-CM | POA: Diagnosis not present

## 2015-09-06 DIAGNOSIS — I509 Heart failure, unspecified: Secondary | ICD-10-CM | POA: Diagnosis not present

## 2015-09-06 DIAGNOSIS — J9691 Respiratory failure, unspecified with hypoxia: Secondary | ICD-10-CM | POA: Diagnosis not present

## 2015-09-06 DIAGNOSIS — J441 Chronic obstructive pulmonary disease with (acute) exacerbation: Secondary | ICD-10-CM | POA: Diagnosis not present

## 2015-09-06 DIAGNOSIS — Z992 Dependence on renal dialysis: Secondary | ICD-10-CM | POA: Diagnosis not present

## 2015-09-06 DIAGNOSIS — D631 Anemia in chronic kidney disease: Secondary | ICD-10-CM | POA: Diagnosis not present

## 2015-09-06 DIAGNOSIS — N186 End stage renal disease: Secondary | ICD-10-CM | POA: Diagnosis not present

## 2015-09-07 DIAGNOSIS — N2581 Secondary hyperparathyroidism of renal origin: Secondary | ICD-10-CM | POA: Diagnosis not present

## 2015-09-07 DIAGNOSIS — D509 Iron deficiency anemia, unspecified: Secondary | ICD-10-CM | POA: Diagnosis not present

## 2015-09-07 DIAGNOSIS — Z992 Dependence on renal dialysis: Secondary | ICD-10-CM | POA: Diagnosis not present

## 2015-09-07 DIAGNOSIS — N186 End stage renal disease: Secondary | ICD-10-CM | POA: Diagnosis not present

## 2015-09-07 DIAGNOSIS — D631 Anemia in chronic kidney disease: Secondary | ICD-10-CM | POA: Diagnosis not present

## 2015-09-08 DIAGNOSIS — Z992 Dependence on renal dialysis: Secondary | ICD-10-CM | POA: Diagnosis not present

## 2015-09-08 DIAGNOSIS — R531 Weakness: Secondary | ICD-10-CM | POA: Diagnosis not present

## 2015-09-08 DIAGNOSIS — N186 End stage renal disease: Secondary | ICD-10-CM | POA: Diagnosis not present

## 2015-09-08 DIAGNOSIS — J9691 Respiratory failure, unspecified with hypoxia: Secondary | ICD-10-CM | POA: Diagnosis not present

## 2015-09-08 DIAGNOSIS — N2581 Secondary hyperparathyroidism of renal origin: Secondary | ICD-10-CM | POA: Diagnosis not present

## 2015-09-08 DIAGNOSIS — J441 Chronic obstructive pulmonary disease with (acute) exacerbation: Secondary | ICD-10-CM | POA: Diagnosis not present

## 2015-09-08 DIAGNOSIS — D631 Anemia in chronic kidney disease: Secondary | ICD-10-CM | POA: Diagnosis not present

## 2015-09-08 DIAGNOSIS — I509 Heart failure, unspecified: Secondary | ICD-10-CM | POA: Diagnosis not present

## 2015-09-08 DIAGNOSIS — Z9981 Dependence on supplemental oxygen: Secondary | ICD-10-CM | POA: Diagnosis not present

## 2015-09-08 DIAGNOSIS — D509 Iron deficiency anemia, unspecified: Secondary | ICD-10-CM | POA: Diagnosis not present

## 2015-09-08 DIAGNOSIS — Z9181 History of falling: Secondary | ICD-10-CM | POA: Diagnosis not present

## 2015-09-09 DIAGNOSIS — J9691 Respiratory failure, unspecified with hypoxia: Secondary | ICD-10-CM | POA: Diagnosis not present

## 2015-09-09 DIAGNOSIS — D631 Anemia in chronic kidney disease: Secondary | ICD-10-CM | POA: Diagnosis not present

## 2015-09-09 DIAGNOSIS — I509 Heart failure, unspecified: Secondary | ICD-10-CM | POA: Diagnosis not present

## 2015-09-09 DIAGNOSIS — N186 End stage renal disease: Secondary | ICD-10-CM | POA: Diagnosis not present

## 2015-09-09 DIAGNOSIS — N2581 Secondary hyperparathyroidism of renal origin: Secondary | ICD-10-CM | POA: Diagnosis not present

## 2015-09-09 DIAGNOSIS — Z992 Dependence on renal dialysis: Secondary | ICD-10-CM | POA: Diagnosis not present

## 2015-09-09 DIAGNOSIS — J441 Chronic obstructive pulmonary disease with (acute) exacerbation: Secondary | ICD-10-CM | POA: Diagnosis not present

## 2015-09-09 DIAGNOSIS — Z9181 History of falling: Secondary | ICD-10-CM | POA: Diagnosis not present

## 2015-09-09 DIAGNOSIS — R531 Weakness: Secondary | ICD-10-CM | POA: Diagnosis not present

## 2015-09-09 DIAGNOSIS — D509 Iron deficiency anemia, unspecified: Secondary | ICD-10-CM | POA: Diagnosis not present

## 2015-09-09 DIAGNOSIS — Z9981 Dependence on supplemental oxygen: Secondary | ICD-10-CM | POA: Diagnosis not present

## 2015-09-10 DIAGNOSIS — I509 Heart failure, unspecified: Secondary | ICD-10-CM | POA: Diagnosis not present

## 2015-09-10 DIAGNOSIS — D509 Iron deficiency anemia, unspecified: Secondary | ICD-10-CM | POA: Diagnosis not present

## 2015-09-10 DIAGNOSIS — N2581 Secondary hyperparathyroidism of renal origin: Secondary | ICD-10-CM | POA: Diagnosis not present

## 2015-09-10 DIAGNOSIS — Z9981 Dependence on supplemental oxygen: Secondary | ICD-10-CM | POA: Diagnosis not present

## 2015-09-10 DIAGNOSIS — R531 Weakness: Secondary | ICD-10-CM | POA: Diagnosis not present

## 2015-09-10 DIAGNOSIS — D631 Anemia in chronic kidney disease: Secondary | ICD-10-CM | POA: Diagnosis not present

## 2015-09-10 DIAGNOSIS — J441 Chronic obstructive pulmonary disease with (acute) exacerbation: Secondary | ICD-10-CM | POA: Diagnosis not present

## 2015-09-10 DIAGNOSIS — J9691 Respiratory failure, unspecified with hypoxia: Secondary | ICD-10-CM | POA: Diagnosis not present

## 2015-09-10 DIAGNOSIS — Z992 Dependence on renal dialysis: Secondary | ICD-10-CM | POA: Diagnosis not present

## 2015-09-10 DIAGNOSIS — Z9181 History of falling: Secondary | ICD-10-CM | POA: Diagnosis not present

## 2015-09-10 DIAGNOSIS — N186 End stage renal disease: Secondary | ICD-10-CM | POA: Diagnosis not present

## 2015-09-11 DIAGNOSIS — N2581 Secondary hyperparathyroidism of renal origin: Secondary | ICD-10-CM | POA: Diagnosis not present

## 2015-09-11 DIAGNOSIS — D631 Anemia in chronic kidney disease: Secondary | ICD-10-CM | POA: Diagnosis not present

## 2015-09-11 DIAGNOSIS — N186 End stage renal disease: Secondary | ICD-10-CM | POA: Diagnosis not present

## 2015-09-11 DIAGNOSIS — Z992 Dependence on renal dialysis: Secondary | ICD-10-CM | POA: Diagnosis not present

## 2015-09-11 DIAGNOSIS — D509 Iron deficiency anemia, unspecified: Secondary | ICD-10-CM | POA: Diagnosis not present

## 2015-09-12 DIAGNOSIS — N186 End stage renal disease: Secondary | ICD-10-CM | POA: Diagnosis not present

## 2015-09-12 DIAGNOSIS — N2581 Secondary hyperparathyroidism of renal origin: Secondary | ICD-10-CM | POA: Diagnosis not present

## 2015-09-12 DIAGNOSIS — D509 Iron deficiency anemia, unspecified: Secondary | ICD-10-CM | POA: Diagnosis not present

## 2015-09-12 DIAGNOSIS — Z992 Dependence on renal dialysis: Secondary | ICD-10-CM | POA: Diagnosis not present

## 2015-09-12 DIAGNOSIS — D631 Anemia in chronic kidney disease: Secondary | ICD-10-CM | POA: Diagnosis not present

## 2015-09-13 DIAGNOSIS — J441 Chronic obstructive pulmonary disease with (acute) exacerbation: Secondary | ICD-10-CM | POA: Diagnosis not present

## 2015-09-13 DIAGNOSIS — J9691 Respiratory failure, unspecified with hypoxia: Secondary | ICD-10-CM | POA: Diagnosis not present

## 2015-09-13 DIAGNOSIS — R531 Weakness: Secondary | ICD-10-CM | POA: Diagnosis not present

## 2015-09-13 DIAGNOSIS — I509 Heart failure, unspecified: Secondary | ICD-10-CM | POA: Diagnosis not present

## 2015-09-13 DIAGNOSIS — N186 End stage renal disease: Secondary | ICD-10-CM | POA: Diagnosis not present

## 2015-09-13 DIAGNOSIS — D631 Anemia in chronic kidney disease: Secondary | ICD-10-CM | POA: Diagnosis not present

## 2015-09-13 DIAGNOSIS — N2581 Secondary hyperparathyroidism of renal origin: Secondary | ICD-10-CM | POA: Diagnosis not present

## 2015-09-13 DIAGNOSIS — D509 Iron deficiency anemia, unspecified: Secondary | ICD-10-CM | POA: Diagnosis not present

## 2015-09-13 DIAGNOSIS — Z9181 History of falling: Secondary | ICD-10-CM | POA: Diagnosis not present

## 2015-09-13 DIAGNOSIS — Z992 Dependence on renal dialysis: Secondary | ICD-10-CM | POA: Diagnosis not present

## 2015-09-13 DIAGNOSIS — Z9981 Dependence on supplemental oxygen: Secondary | ICD-10-CM | POA: Diagnosis not present

## 2015-09-14 DIAGNOSIS — N2581 Secondary hyperparathyroidism of renal origin: Secondary | ICD-10-CM | POA: Diagnosis not present

## 2015-09-14 DIAGNOSIS — D509 Iron deficiency anemia, unspecified: Secondary | ICD-10-CM | POA: Diagnosis not present

## 2015-09-14 DIAGNOSIS — Z992 Dependence on renal dialysis: Secondary | ICD-10-CM | POA: Diagnosis not present

## 2015-09-14 DIAGNOSIS — N186 End stage renal disease: Secondary | ICD-10-CM | POA: Diagnosis not present

## 2015-09-14 DIAGNOSIS — D631 Anemia in chronic kidney disease: Secondary | ICD-10-CM | POA: Diagnosis not present

## 2015-09-15 DIAGNOSIS — Z9981 Dependence on supplemental oxygen: Secondary | ICD-10-CM | POA: Diagnosis not present

## 2015-09-15 DIAGNOSIS — D509 Iron deficiency anemia, unspecified: Secondary | ICD-10-CM | POA: Diagnosis not present

## 2015-09-15 DIAGNOSIS — J441 Chronic obstructive pulmonary disease with (acute) exacerbation: Secondary | ICD-10-CM | POA: Diagnosis not present

## 2015-09-15 DIAGNOSIS — I509 Heart failure, unspecified: Secondary | ICD-10-CM | POA: Diagnosis not present

## 2015-09-15 DIAGNOSIS — Z992 Dependence on renal dialysis: Secondary | ICD-10-CM | POA: Diagnosis not present

## 2015-09-15 DIAGNOSIS — D631 Anemia in chronic kidney disease: Secondary | ICD-10-CM | POA: Diagnosis not present

## 2015-09-15 DIAGNOSIS — E785 Hyperlipidemia, unspecified: Secondary | ICD-10-CM | POA: Diagnosis not present

## 2015-09-15 DIAGNOSIS — J9691 Respiratory failure, unspecified with hypoxia: Secondary | ICD-10-CM | POA: Diagnosis not present

## 2015-09-15 DIAGNOSIS — N2581 Secondary hyperparathyroidism of renal origin: Secondary | ICD-10-CM | POA: Diagnosis not present

## 2015-09-15 DIAGNOSIS — N186 End stage renal disease: Secondary | ICD-10-CM | POA: Diagnosis not present

## 2015-09-15 DIAGNOSIS — R531 Weakness: Secondary | ICD-10-CM | POA: Diagnosis not present

## 2015-09-15 DIAGNOSIS — Z9181 History of falling: Secondary | ICD-10-CM | POA: Diagnosis not present

## 2015-09-16 DIAGNOSIS — D509 Iron deficiency anemia, unspecified: Secondary | ICD-10-CM | POA: Diagnosis not present

## 2015-09-16 DIAGNOSIS — N186 End stage renal disease: Secondary | ICD-10-CM | POA: Diagnosis not present

## 2015-09-16 DIAGNOSIS — N2581 Secondary hyperparathyroidism of renal origin: Secondary | ICD-10-CM | POA: Diagnosis not present

## 2015-09-16 DIAGNOSIS — D631 Anemia in chronic kidney disease: Secondary | ICD-10-CM | POA: Diagnosis not present

## 2015-09-16 DIAGNOSIS — Z992 Dependence on renal dialysis: Secondary | ICD-10-CM | POA: Diagnosis not present

## 2015-09-17 DIAGNOSIS — Z9181 History of falling: Secondary | ICD-10-CM | POA: Diagnosis not present

## 2015-09-17 DIAGNOSIS — J9691 Respiratory failure, unspecified with hypoxia: Secondary | ICD-10-CM | POA: Diagnosis not present

## 2015-09-17 DIAGNOSIS — Z992 Dependence on renal dialysis: Secondary | ICD-10-CM | POA: Diagnosis not present

## 2015-09-17 DIAGNOSIS — N186 End stage renal disease: Secondary | ICD-10-CM | POA: Diagnosis not present

## 2015-09-17 DIAGNOSIS — D631 Anemia in chronic kidney disease: Secondary | ICD-10-CM | POA: Diagnosis not present

## 2015-09-17 DIAGNOSIS — Z9981 Dependence on supplemental oxygen: Secondary | ICD-10-CM | POA: Diagnosis not present

## 2015-09-17 DIAGNOSIS — N2581 Secondary hyperparathyroidism of renal origin: Secondary | ICD-10-CM | POA: Diagnosis not present

## 2015-09-17 DIAGNOSIS — D509 Iron deficiency anemia, unspecified: Secondary | ICD-10-CM | POA: Diagnosis not present

## 2015-09-17 DIAGNOSIS — I509 Heart failure, unspecified: Secondary | ICD-10-CM | POA: Diagnosis not present

## 2015-09-17 DIAGNOSIS — J441 Chronic obstructive pulmonary disease with (acute) exacerbation: Secondary | ICD-10-CM | POA: Diagnosis not present

## 2015-09-17 DIAGNOSIS — R531 Weakness: Secondary | ICD-10-CM | POA: Diagnosis not present

## 2015-09-18 DIAGNOSIS — N2581 Secondary hyperparathyroidism of renal origin: Secondary | ICD-10-CM | POA: Diagnosis not present

## 2015-09-18 DIAGNOSIS — D509 Iron deficiency anemia, unspecified: Secondary | ICD-10-CM | POA: Diagnosis not present

## 2015-09-18 DIAGNOSIS — N186 End stage renal disease: Secondary | ICD-10-CM | POA: Diagnosis not present

## 2015-09-18 DIAGNOSIS — Z992 Dependence on renal dialysis: Secondary | ICD-10-CM | POA: Diagnosis not present

## 2015-09-18 DIAGNOSIS — D631 Anemia in chronic kidney disease: Secondary | ICD-10-CM | POA: Diagnosis not present

## 2015-09-19 DIAGNOSIS — Z992 Dependence on renal dialysis: Secondary | ICD-10-CM | POA: Diagnosis not present

## 2015-09-19 DIAGNOSIS — D631 Anemia in chronic kidney disease: Secondary | ICD-10-CM | POA: Diagnosis not present

## 2015-09-19 DIAGNOSIS — D509 Iron deficiency anemia, unspecified: Secondary | ICD-10-CM | POA: Diagnosis not present

## 2015-09-19 DIAGNOSIS — N186 End stage renal disease: Secondary | ICD-10-CM | POA: Diagnosis not present

## 2015-09-19 DIAGNOSIS — N2581 Secondary hyperparathyroidism of renal origin: Secondary | ICD-10-CM | POA: Diagnosis not present

## 2015-09-20 DIAGNOSIS — D631 Anemia in chronic kidney disease: Secondary | ICD-10-CM | POA: Diagnosis not present

## 2015-09-20 DIAGNOSIS — Z9981 Dependence on supplemental oxygen: Secondary | ICD-10-CM | POA: Diagnosis not present

## 2015-09-20 DIAGNOSIS — R531 Weakness: Secondary | ICD-10-CM | POA: Diagnosis not present

## 2015-09-20 DIAGNOSIS — J9691 Respiratory failure, unspecified with hypoxia: Secondary | ICD-10-CM | POA: Diagnosis not present

## 2015-09-20 DIAGNOSIS — N186 End stage renal disease: Secondary | ICD-10-CM | POA: Diagnosis not present

## 2015-09-20 DIAGNOSIS — Z992 Dependence on renal dialysis: Secondary | ICD-10-CM | POA: Diagnosis not present

## 2015-09-20 DIAGNOSIS — I509 Heart failure, unspecified: Secondary | ICD-10-CM | POA: Diagnosis not present

## 2015-09-20 DIAGNOSIS — D509 Iron deficiency anemia, unspecified: Secondary | ICD-10-CM | POA: Diagnosis not present

## 2015-09-20 DIAGNOSIS — J441 Chronic obstructive pulmonary disease with (acute) exacerbation: Secondary | ICD-10-CM | POA: Diagnosis not present

## 2015-09-20 DIAGNOSIS — Z9181 History of falling: Secondary | ICD-10-CM | POA: Diagnosis not present

## 2015-09-20 DIAGNOSIS — N2581 Secondary hyperparathyroidism of renal origin: Secondary | ICD-10-CM | POA: Diagnosis not present

## 2015-09-21 ENCOUNTER — Encounter: Payer: Self-pay | Admitting: General Surgery

## 2015-09-21 DIAGNOSIS — Z992 Dependence on renal dialysis: Secondary | ICD-10-CM | POA: Diagnosis not present

## 2015-09-21 DIAGNOSIS — N2581 Secondary hyperparathyroidism of renal origin: Secondary | ICD-10-CM | POA: Diagnosis not present

## 2015-09-21 DIAGNOSIS — N186 End stage renal disease: Secondary | ICD-10-CM | POA: Diagnosis not present

## 2015-09-21 DIAGNOSIS — D509 Iron deficiency anemia, unspecified: Secondary | ICD-10-CM | POA: Diagnosis not present

## 2015-09-21 DIAGNOSIS — D631 Anemia in chronic kidney disease: Secondary | ICD-10-CM | POA: Diagnosis not present

## 2015-09-22 DIAGNOSIS — Z9981 Dependence on supplemental oxygen: Secondary | ICD-10-CM | POA: Diagnosis not present

## 2015-09-22 DIAGNOSIS — N186 End stage renal disease: Secondary | ICD-10-CM | POA: Diagnosis not present

## 2015-09-22 DIAGNOSIS — D631 Anemia in chronic kidney disease: Secondary | ICD-10-CM | POA: Diagnosis not present

## 2015-09-22 DIAGNOSIS — Z9181 History of falling: Secondary | ICD-10-CM | POA: Diagnosis not present

## 2015-09-22 DIAGNOSIS — D509 Iron deficiency anemia, unspecified: Secondary | ICD-10-CM | POA: Diagnosis not present

## 2015-09-22 DIAGNOSIS — J441 Chronic obstructive pulmonary disease with (acute) exacerbation: Secondary | ICD-10-CM | POA: Diagnosis not present

## 2015-09-22 DIAGNOSIS — I509 Heart failure, unspecified: Secondary | ICD-10-CM | POA: Diagnosis not present

## 2015-09-22 DIAGNOSIS — Z992 Dependence on renal dialysis: Secondary | ICD-10-CM | POA: Diagnosis not present

## 2015-09-22 DIAGNOSIS — J9691 Respiratory failure, unspecified with hypoxia: Secondary | ICD-10-CM | POA: Diagnosis not present

## 2015-09-22 DIAGNOSIS — N2581 Secondary hyperparathyroidism of renal origin: Secondary | ICD-10-CM | POA: Diagnosis not present

## 2015-09-22 DIAGNOSIS — R531 Weakness: Secondary | ICD-10-CM | POA: Diagnosis not present

## 2015-09-23 DIAGNOSIS — N186 End stage renal disease: Secondary | ICD-10-CM | POA: Diagnosis not present

## 2015-09-23 DIAGNOSIS — D631 Anemia in chronic kidney disease: Secondary | ICD-10-CM | POA: Diagnosis not present

## 2015-09-23 DIAGNOSIS — N2581 Secondary hyperparathyroidism of renal origin: Secondary | ICD-10-CM | POA: Diagnosis not present

## 2015-09-23 DIAGNOSIS — Z992 Dependence on renal dialysis: Secondary | ICD-10-CM | POA: Diagnosis not present

## 2015-09-23 DIAGNOSIS — D509 Iron deficiency anemia, unspecified: Secondary | ICD-10-CM | POA: Diagnosis not present

## 2015-09-24 ENCOUNTER — Encounter: Payer: Self-pay | Admitting: General Surgery

## 2015-09-24 DIAGNOSIS — J441 Chronic obstructive pulmonary disease with (acute) exacerbation: Secondary | ICD-10-CM | POA: Diagnosis not present

## 2015-09-24 DIAGNOSIS — N2581 Secondary hyperparathyroidism of renal origin: Secondary | ICD-10-CM | POA: Diagnosis not present

## 2015-09-24 DIAGNOSIS — I509 Heart failure, unspecified: Secondary | ICD-10-CM | POA: Diagnosis not present

## 2015-09-24 DIAGNOSIS — R531 Weakness: Secondary | ICD-10-CM | POA: Diagnosis not present

## 2015-09-24 DIAGNOSIS — Z9181 History of falling: Secondary | ICD-10-CM | POA: Diagnosis not present

## 2015-09-24 DIAGNOSIS — Z992 Dependence on renal dialysis: Secondary | ICD-10-CM | POA: Diagnosis not present

## 2015-09-24 DIAGNOSIS — J9691 Respiratory failure, unspecified with hypoxia: Secondary | ICD-10-CM | POA: Diagnosis not present

## 2015-09-24 DIAGNOSIS — D509 Iron deficiency anemia, unspecified: Secondary | ICD-10-CM | POA: Diagnosis not present

## 2015-09-24 DIAGNOSIS — Z9981 Dependence on supplemental oxygen: Secondary | ICD-10-CM | POA: Diagnosis not present

## 2015-09-24 DIAGNOSIS — D631 Anemia in chronic kidney disease: Secondary | ICD-10-CM | POA: Diagnosis not present

## 2015-09-24 DIAGNOSIS — N186 End stage renal disease: Secondary | ICD-10-CM | POA: Diagnosis not present

## 2015-09-25 DIAGNOSIS — D631 Anemia in chronic kidney disease: Secondary | ICD-10-CM | POA: Diagnosis not present

## 2015-09-25 DIAGNOSIS — N2581 Secondary hyperparathyroidism of renal origin: Secondary | ICD-10-CM | POA: Diagnosis not present

## 2015-09-25 DIAGNOSIS — D509 Iron deficiency anemia, unspecified: Secondary | ICD-10-CM | POA: Diagnosis not present

## 2015-09-25 DIAGNOSIS — N186 End stage renal disease: Secondary | ICD-10-CM | POA: Diagnosis not present

## 2015-09-25 DIAGNOSIS — Z992 Dependence on renal dialysis: Secondary | ICD-10-CM | POA: Diagnosis not present

## 2015-09-26 DIAGNOSIS — D631 Anemia in chronic kidney disease: Secondary | ICD-10-CM | POA: Diagnosis not present

## 2015-09-26 DIAGNOSIS — Z992 Dependence on renal dialysis: Secondary | ICD-10-CM | POA: Diagnosis not present

## 2015-09-26 DIAGNOSIS — D509 Iron deficiency anemia, unspecified: Secondary | ICD-10-CM | POA: Diagnosis not present

## 2015-09-26 DIAGNOSIS — N186 End stage renal disease: Secondary | ICD-10-CM | POA: Diagnosis not present

## 2015-09-26 DIAGNOSIS — N2581 Secondary hyperparathyroidism of renal origin: Secondary | ICD-10-CM | POA: Diagnosis not present

## 2015-09-27 DIAGNOSIS — N2581 Secondary hyperparathyroidism of renal origin: Secondary | ICD-10-CM | POA: Diagnosis not present

## 2015-09-27 DIAGNOSIS — D509 Iron deficiency anemia, unspecified: Secondary | ICD-10-CM | POA: Diagnosis not present

## 2015-09-27 DIAGNOSIS — N186 End stage renal disease: Secondary | ICD-10-CM | POA: Diagnosis not present

## 2015-09-27 DIAGNOSIS — Z9981 Dependence on supplemental oxygen: Secondary | ICD-10-CM | POA: Diagnosis not present

## 2015-09-27 DIAGNOSIS — I509 Heart failure, unspecified: Secondary | ICD-10-CM | POA: Diagnosis not present

## 2015-09-27 DIAGNOSIS — J441 Chronic obstructive pulmonary disease with (acute) exacerbation: Secondary | ICD-10-CM | POA: Diagnosis not present

## 2015-09-27 DIAGNOSIS — R531 Weakness: Secondary | ICD-10-CM | POA: Diagnosis not present

## 2015-09-27 DIAGNOSIS — Z9181 History of falling: Secondary | ICD-10-CM | POA: Diagnosis not present

## 2015-09-27 DIAGNOSIS — J9691 Respiratory failure, unspecified with hypoxia: Secondary | ICD-10-CM | POA: Diagnosis not present

## 2015-09-27 DIAGNOSIS — D631 Anemia in chronic kidney disease: Secondary | ICD-10-CM | POA: Diagnosis not present

## 2015-09-27 DIAGNOSIS — Z992 Dependence on renal dialysis: Secondary | ICD-10-CM | POA: Diagnosis not present

## 2015-09-28 DIAGNOSIS — Z9181 History of falling: Secondary | ICD-10-CM | POA: Diagnosis not present

## 2015-09-28 DIAGNOSIS — D631 Anemia in chronic kidney disease: Secondary | ICD-10-CM | POA: Diagnosis not present

## 2015-09-28 DIAGNOSIS — N2581 Secondary hyperparathyroidism of renal origin: Secondary | ICD-10-CM | POA: Diagnosis not present

## 2015-09-28 DIAGNOSIS — Z992 Dependence on renal dialysis: Secondary | ICD-10-CM | POA: Diagnosis not present

## 2015-09-28 DIAGNOSIS — Z9981 Dependence on supplemental oxygen: Secondary | ICD-10-CM | POA: Diagnosis not present

## 2015-09-28 DIAGNOSIS — J9691 Respiratory failure, unspecified with hypoxia: Secondary | ICD-10-CM | POA: Diagnosis not present

## 2015-09-28 DIAGNOSIS — D509 Iron deficiency anemia, unspecified: Secondary | ICD-10-CM | POA: Diagnosis not present

## 2015-09-28 DIAGNOSIS — R531 Weakness: Secondary | ICD-10-CM | POA: Diagnosis not present

## 2015-09-28 DIAGNOSIS — I509 Heart failure, unspecified: Secondary | ICD-10-CM | POA: Diagnosis not present

## 2015-09-28 DIAGNOSIS — J441 Chronic obstructive pulmonary disease with (acute) exacerbation: Secondary | ICD-10-CM | POA: Diagnosis not present

## 2015-09-28 DIAGNOSIS — N186 End stage renal disease: Secondary | ICD-10-CM | POA: Diagnosis not present

## 2015-09-29 DIAGNOSIS — D631 Anemia in chronic kidney disease: Secondary | ICD-10-CM | POA: Diagnosis not present

## 2015-09-29 DIAGNOSIS — R531 Weakness: Secondary | ICD-10-CM | POA: Diagnosis not present

## 2015-09-29 DIAGNOSIS — Z9981 Dependence on supplemental oxygen: Secondary | ICD-10-CM | POA: Diagnosis not present

## 2015-09-29 DIAGNOSIS — N2581 Secondary hyperparathyroidism of renal origin: Secondary | ICD-10-CM | POA: Diagnosis not present

## 2015-09-29 DIAGNOSIS — J441 Chronic obstructive pulmonary disease with (acute) exacerbation: Secondary | ICD-10-CM | POA: Diagnosis not present

## 2015-09-29 DIAGNOSIS — Z992 Dependence on renal dialysis: Secondary | ICD-10-CM | POA: Diagnosis not present

## 2015-09-29 DIAGNOSIS — D509 Iron deficiency anemia, unspecified: Secondary | ICD-10-CM | POA: Diagnosis not present

## 2015-09-29 DIAGNOSIS — J9691 Respiratory failure, unspecified with hypoxia: Secondary | ICD-10-CM | POA: Diagnosis not present

## 2015-09-29 DIAGNOSIS — N186 End stage renal disease: Secondary | ICD-10-CM | POA: Diagnosis not present

## 2015-09-29 DIAGNOSIS — Z9181 History of falling: Secondary | ICD-10-CM | POA: Diagnosis not present

## 2015-09-29 DIAGNOSIS — I509 Heart failure, unspecified: Secondary | ICD-10-CM | POA: Diagnosis not present

## 2015-09-30 DIAGNOSIS — N2581 Secondary hyperparathyroidism of renal origin: Secondary | ICD-10-CM | POA: Diagnosis not present

## 2015-09-30 DIAGNOSIS — D509 Iron deficiency anemia, unspecified: Secondary | ICD-10-CM | POA: Diagnosis not present

## 2015-09-30 DIAGNOSIS — Z992 Dependence on renal dialysis: Secondary | ICD-10-CM | POA: Diagnosis not present

## 2015-09-30 DIAGNOSIS — N186 End stage renal disease: Secondary | ICD-10-CM | POA: Diagnosis not present

## 2015-09-30 DIAGNOSIS — D631 Anemia in chronic kidney disease: Secondary | ICD-10-CM | POA: Diagnosis not present

## 2015-10-01 DIAGNOSIS — Z9181 History of falling: Secondary | ICD-10-CM | POA: Diagnosis not present

## 2015-10-01 DIAGNOSIS — N2581 Secondary hyperparathyroidism of renal origin: Secondary | ICD-10-CM | POA: Diagnosis not present

## 2015-10-01 DIAGNOSIS — R531 Weakness: Secondary | ICD-10-CM | POA: Diagnosis not present

## 2015-10-01 DIAGNOSIS — J9691 Respiratory failure, unspecified with hypoxia: Secondary | ICD-10-CM | POA: Diagnosis not present

## 2015-10-01 DIAGNOSIS — I509 Heart failure, unspecified: Secondary | ICD-10-CM | POA: Diagnosis not present

## 2015-10-01 DIAGNOSIS — D509 Iron deficiency anemia, unspecified: Secondary | ICD-10-CM | POA: Diagnosis not present

## 2015-10-01 DIAGNOSIS — Z992 Dependence on renal dialysis: Secondary | ICD-10-CM | POA: Diagnosis not present

## 2015-10-01 DIAGNOSIS — D631 Anemia in chronic kidney disease: Secondary | ICD-10-CM | POA: Diagnosis not present

## 2015-10-01 DIAGNOSIS — N186 End stage renal disease: Secondary | ICD-10-CM | POA: Diagnosis not present

## 2015-10-01 DIAGNOSIS — J441 Chronic obstructive pulmonary disease with (acute) exacerbation: Secondary | ICD-10-CM | POA: Diagnosis not present

## 2015-10-01 DIAGNOSIS — Z9981 Dependence on supplemental oxygen: Secondary | ICD-10-CM | POA: Diagnosis not present

## 2015-10-02 DIAGNOSIS — Z992 Dependence on renal dialysis: Secondary | ICD-10-CM | POA: Diagnosis not present

## 2015-10-02 DIAGNOSIS — N2581 Secondary hyperparathyroidism of renal origin: Secondary | ICD-10-CM | POA: Diagnosis not present

## 2015-10-02 DIAGNOSIS — D631 Anemia in chronic kidney disease: Secondary | ICD-10-CM | POA: Diagnosis not present

## 2015-10-02 DIAGNOSIS — N186 End stage renal disease: Secondary | ICD-10-CM | POA: Diagnosis not present

## 2015-10-02 DIAGNOSIS — D509 Iron deficiency anemia, unspecified: Secondary | ICD-10-CM | POA: Diagnosis not present

## 2015-10-03 DIAGNOSIS — N2581 Secondary hyperparathyroidism of renal origin: Secondary | ICD-10-CM | POA: Diagnosis not present

## 2015-10-03 DIAGNOSIS — D631 Anemia in chronic kidney disease: Secondary | ICD-10-CM | POA: Diagnosis not present

## 2015-10-03 DIAGNOSIS — D509 Iron deficiency anemia, unspecified: Secondary | ICD-10-CM | POA: Diagnosis not present

## 2015-10-03 DIAGNOSIS — Z992 Dependence on renal dialysis: Secondary | ICD-10-CM | POA: Diagnosis not present

## 2015-10-03 DIAGNOSIS — N186 End stage renal disease: Secondary | ICD-10-CM | POA: Diagnosis not present

## 2015-10-04 DIAGNOSIS — I509 Heart failure, unspecified: Secondary | ICD-10-CM | POA: Diagnosis not present

## 2015-10-04 DIAGNOSIS — N2581 Secondary hyperparathyroidism of renal origin: Secondary | ICD-10-CM | POA: Diagnosis not present

## 2015-10-04 DIAGNOSIS — J441 Chronic obstructive pulmonary disease with (acute) exacerbation: Secondary | ICD-10-CM | POA: Diagnosis not present

## 2015-10-04 DIAGNOSIS — Z9981 Dependence on supplemental oxygen: Secondary | ICD-10-CM | POA: Diagnosis not present

## 2015-10-04 DIAGNOSIS — D631 Anemia in chronic kidney disease: Secondary | ICD-10-CM | POA: Diagnosis not present

## 2015-10-04 DIAGNOSIS — N186 End stage renal disease: Secondary | ICD-10-CM | POA: Diagnosis not present

## 2015-10-04 DIAGNOSIS — D509 Iron deficiency anemia, unspecified: Secondary | ICD-10-CM | POA: Diagnosis not present

## 2015-10-04 DIAGNOSIS — J9691 Respiratory failure, unspecified with hypoxia: Secondary | ICD-10-CM | POA: Diagnosis not present

## 2015-10-04 DIAGNOSIS — Z9181 History of falling: Secondary | ICD-10-CM | POA: Diagnosis not present

## 2015-10-04 DIAGNOSIS — Z992 Dependence on renal dialysis: Secondary | ICD-10-CM | POA: Diagnosis not present

## 2015-10-04 DIAGNOSIS — R531 Weakness: Secondary | ICD-10-CM | POA: Diagnosis not present

## 2015-10-05 DIAGNOSIS — Z812 Family history of tobacco abuse and dependence: Secondary | ICD-10-CM | POA: Diagnosis not present

## 2015-10-05 DIAGNOSIS — Z923 Personal history of irradiation: Secondary | ICD-10-CM | POA: Diagnosis not present

## 2015-10-05 DIAGNOSIS — Z992 Dependence on renal dialysis: Secondary | ICD-10-CM | POA: Diagnosis not present

## 2015-10-05 DIAGNOSIS — J9691 Respiratory failure, unspecified with hypoxia: Secondary | ICD-10-CM | POA: Diagnosis not present

## 2015-10-05 DIAGNOSIS — D631 Anemia in chronic kidney disease: Secondary | ICD-10-CM | POA: Diagnosis not present

## 2015-10-05 DIAGNOSIS — I509 Heart failure, unspecified: Secondary | ICD-10-CM | POA: Diagnosis not present

## 2015-10-05 DIAGNOSIS — J441 Chronic obstructive pulmonary disease with (acute) exacerbation: Secondary | ICD-10-CM | POA: Diagnosis not present

## 2015-10-05 DIAGNOSIS — R627 Adult failure to thrive: Secondary | ICD-10-CM | POA: Diagnosis not present

## 2015-10-05 DIAGNOSIS — D509 Iron deficiency anemia, unspecified: Secondary | ICD-10-CM | POA: Diagnosis not present

## 2015-10-05 DIAGNOSIS — Z853 Personal history of malignant neoplasm of breast: Secondary | ICD-10-CM | POA: Diagnosis not present

## 2015-10-05 DIAGNOSIS — Z9981 Dependence on supplemental oxygen: Secondary | ICD-10-CM | POA: Diagnosis not present

## 2015-10-05 DIAGNOSIS — R531 Weakness: Secondary | ICD-10-CM | POA: Diagnosis not present

## 2015-10-05 DIAGNOSIS — Z9181 History of falling: Secondary | ICD-10-CM | POA: Diagnosis not present

## 2015-10-05 DIAGNOSIS — N186 End stage renal disease: Secondary | ICD-10-CM | POA: Diagnosis not present

## 2015-10-06 DIAGNOSIS — N186 End stage renal disease: Secondary | ICD-10-CM | POA: Diagnosis not present

## 2015-10-06 DIAGNOSIS — J9691 Respiratory failure, unspecified with hypoxia: Secondary | ICD-10-CM | POA: Diagnosis not present

## 2015-10-06 DIAGNOSIS — Z992 Dependence on renal dialysis: Secondary | ICD-10-CM | POA: Diagnosis not present

## 2015-10-06 DIAGNOSIS — J441 Chronic obstructive pulmonary disease with (acute) exacerbation: Secondary | ICD-10-CM | POA: Diagnosis not present

## 2015-10-06 DIAGNOSIS — I509 Heart failure, unspecified: Secondary | ICD-10-CM | POA: Diagnosis not present

## 2015-10-06 DIAGNOSIS — D509 Iron deficiency anemia, unspecified: Secondary | ICD-10-CM | POA: Diagnosis not present

## 2015-10-06 DIAGNOSIS — D631 Anemia in chronic kidney disease: Secondary | ICD-10-CM | POA: Diagnosis not present

## 2015-10-06 DIAGNOSIS — R531 Weakness: Secondary | ICD-10-CM | POA: Diagnosis not present

## 2015-10-06 DIAGNOSIS — Z9181 History of falling: Secondary | ICD-10-CM | POA: Diagnosis not present

## 2015-10-06 DIAGNOSIS — Z9981 Dependence on supplemental oxygen: Secondary | ICD-10-CM | POA: Diagnosis not present

## 2015-10-07 DIAGNOSIS — N186 End stage renal disease: Secondary | ICD-10-CM | POA: Diagnosis not present

## 2015-10-07 DIAGNOSIS — Z992 Dependence on renal dialysis: Secondary | ICD-10-CM | POA: Diagnosis not present

## 2015-10-07 DIAGNOSIS — D509 Iron deficiency anemia, unspecified: Secondary | ICD-10-CM | POA: Diagnosis not present

## 2015-10-07 DIAGNOSIS — D631 Anemia in chronic kidney disease: Secondary | ICD-10-CM | POA: Diagnosis not present

## 2015-10-08 DIAGNOSIS — D509 Iron deficiency anemia, unspecified: Secondary | ICD-10-CM | POA: Diagnosis not present

## 2015-10-08 DIAGNOSIS — Z9181 History of falling: Secondary | ICD-10-CM | POA: Diagnosis not present

## 2015-10-08 DIAGNOSIS — N186 End stage renal disease: Secondary | ICD-10-CM | POA: Diagnosis not present

## 2015-10-08 DIAGNOSIS — D631 Anemia in chronic kidney disease: Secondary | ICD-10-CM | POA: Diagnosis not present

## 2015-10-08 DIAGNOSIS — Z9981 Dependence on supplemental oxygen: Secondary | ICD-10-CM | POA: Diagnosis not present

## 2015-10-08 DIAGNOSIS — R531 Weakness: Secondary | ICD-10-CM | POA: Diagnosis not present

## 2015-10-08 DIAGNOSIS — I509 Heart failure, unspecified: Secondary | ICD-10-CM | POA: Diagnosis not present

## 2015-10-08 DIAGNOSIS — Z992 Dependence on renal dialysis: Secondary | ICD-10-CM | POA: Diagnosis not present

## 2015-10-08 DIAGNOSIS — J441 Chronic obstructive pulmonary disease with (acute) exacerbation: Secondary | ICD-10-CM | POA: Diagnosis not present

## 2015-10-08 DIAGNOSIS — J9691 Respiratory failure, unspecified with hypoxia: Secondary | ICD-10-CM | POA: Diagnosis not present

## 2015-10-09 DIAGNOSIS — D631 Anemia in chronic kidney disease: Secondary | ICD-10-CM | POA: Diagnosis not present

## 2015-10-09 DIAGNOSIS — Z992 Dependence on renal dialysis: Secondary | ICD-10-CM | POA: Diagnosis not present

## 2015-10-09 DIAGNOSIS — D509 Iron deficiency anemia, unspecified: Secondary | ICD-10-CM | POA: Diagnosis not present

## 2015-10-09 DIAGNOSIS — N186 End stage renal disease: Secondary | ICD-10-CM | POA: Diagnosis not present

## 2015-10-10 DIAGNOSIS — D509 Iron deficiency anemia, unspecified: Secondary | ICD-10-CM | POA: Diagnosis not present

## 2015-10-10 DIAGNOSIS — Z992 Dependence on renal dialysis: Secondary | ICD-10-CM | POA: Diagnosis not present

## 2015-10-10 DIAGNOSIS — N186 End stage renal disease: Secondary | ICD-10-CM | POA: Diagnosis not present

## 2015-10-10 DIAGNOSIS — D631 Anemia in chronic kidney disease: Secondary | ICD-10-CM | POA: Diagnosis not present

## 2015-10-11 DIAGNOSIS — D631 Anemia in chronic kidney disease: Secondary | ICD-10-CM | POA: Diagnosis not present

## 2015-10-11 DIAGNOSIS — J441 Chronic obstructive pulmonary disease with (acute) exacerbation: Secondary | ICD-10-CM | POA: Diagnosis not present

## 2015-10-11 DIAGNOSIS — Z992 Dependence on renal dialysis: Secondary | ICD-10-CM | POA: Diagnosis not present

## 2015-10-11 DIAGNOSIS — D509 Iron deficiency anemia, unspecified: Secondary | ICD-10-CM | POA: Diagnosis not present

## 2015-10-11 DIAGNOSIS — Z9981 Dependence on supplemental oxygen: Secondary | ICD-10-CM | POA: Diagnosis not present

## 2015-10-11 DIAGNOSIS — J9691 Respiratory failure, unspecified with hypoxia: Secondary | ICD-10-CM | POA: Diagnosis not present

## 2015-10-11 DIAGNOSIS — Z9181 History of falling: Secondary | ICD-10-CM | POA: Diagnosis not present

## 2015-10-11 DIAGNOSIS — I509 Heart failure, unspecified: Secondary | ICD-10-CM | POA: Diagnosis not present

## 2015-10-11 DIAGNOSIS — N186 End stage renal disease: Secondary | ICD-10-CM | POA: Diagnosis not present

## 2015-10-11 DIAGNOSIS — R531 Weakness: Secondary | ICD-10-CM | POA: Diagnosis not present

## 2015-10-12 DIAGNOSIS — Z9981 Dependence on supplemental oxygen: Secondary | ICD-10-CM | POA: Diagnosis not present

## 2015-10-12 DIAGNOSIS — Z9181 History of falling: Secondary | ICD-10-CM | POA: Diagnosis not present

## 2015-10-12 DIAGNOSIS — J9691 Respiratory failure, unspecified with hypoxia: Secondary | ICD-10-CM | POA: Diagnosis not present

## 2015-10-12 DIAGNOSIS — D509 Iron deficiency anemia, unspecified: Secondary | ICD-10-CM | POA: Diagnosis not present

## 2015-10-12 DIAGNOSIS — D631 Anemia in chronic kidney disease: Secondary | ICD-10-CM | POA: Diagnosis not present

## 2015-10-12 DIAGNOSIS — I509 Heart failure, unspecified: Secondary | ICD-10-CM | POA: Diagnosis not present

## 2015-10-12 DIAGNOSIS — R531 Weakness: Secondary | ICD-10-CM | POA: Diagnosis not present

## 2015-10-12 DIAGNOSIS — N186 End stage renal disease: Secondary | ICD-10-CM | POA: Diagnosis not present

## 2015-10-12 DIAGNOSIS — J441 Chronic obstructive pulmonary disease with (acute) exacerbation: Secondary | ICD-10-CM | POA: Diagnosis not present

## 2015-10-12 DIAGNOSIS — Z992 Dependence on renal dialysis: Secondary | ICD-10-CM | POA: Diagnosis not present

## 2015-10-13 DIAGNOSIS — D509 Iron deficiency anemia, unspecified: Secondary | ICD-10-CM | POA: Diagnosis not present

## 2015-10-13 DIAGNOSIS — J9691 Respiratory failure, unspecified with hypoxia: Secondary | ICD-10-CM | POA: Diagnosis not present

## 2015-10-13 DIAGNOSIS — Z9181 History of falling: Secondary | ICD-10-CM | POA: Diagnosis not present

## 2015-10-13 DIAGNOSIS — J441 Chronic obstructive pulmonary disease with (acute) exacerbation: Secondary | ICD-10-CM | POA: Diagnosis not present

## 2015-10-13 DIAGNOSIS — Z9981 Dependence on supplemental oxygen: Secondary | ICD-10-CM | POA: Diagnosis not present

## 2015-10-13 DIAGNOSIS — Z992 Dependence on renal dialysis: Secondary | ICD-10-CM | POA: Diagnosis not present

## 2015-10-13 DIAGNOSIS — N186 End stage renal disease: Secondary | ICD-10-CM | POA: Diagnosis not present

## 2015-10-13 DIAGNOSIS — D631 Anemia in chronic kidney disease: Secondary | ICD-10-CM | POA: Diagnosis not present

## 2015-10-13 DIAGNOSIS — R531 Weakness: Secondary | ICD-10-CM | POA: Diagnosis not present

## 2015-10-13 DIAGNOSIS — I509 Heart failure, unspecified: Secondary | ICD-10-CM | POA: Diagnosis not present

## 2015-10-14 DIAGNOSIS — Z992 Dependence on renal dialysis: Secondary | ICD-10-CM | POA: Diagnosis not present

## 2015-10-14 DIAGNOSIS — D509 Iron deficiency anemia, unspecified: Secondary | ICD-10-CM | POA: Diagnosis not present

## 2015-10-14 DIAGNOSIS — N186 End stage renal disease: Secondary | ICD-10-CM | POA: Diagnosis not present

## 2015-10-14 DIAGNOSIS — D631 Anemia in chronic kidney disease: Secondary | ICD-10-CM | POA: Diagnosis not present

## 2015-10-15 DIAGNOSIS — Z992 Dependence on renal dialysis: Secondary | ICD-10-CM | POA: Diagnosis not present

## 2015-10-15 DIAGNOSIS — I509 Heart failure, unspecified: Secondary | ICD-10-CM | POA: Diagnosis not present

## 2015-10-15 DIAGNOSIS — R531 Weakness: Secondary | ICD-10-CM | POA: Diagnosis not present

## 2015-10-15 DIAGNOSIS — J441 Chronic obstructive pulmonary disease with (acute) exacerbation: Secondary | ICD-10-CM | POA: Diagnosis not present

## 2015-10-15 DIAGNOSIS — Z9181 History of falling: Secondary | ICD-10-CM | POA: Diagnosis not present

## 2015-10-15 DIAGNOSIS — N186 End stage renal disease: Secondary | ICD-10-CM | POA: Diagnosis not present

## 2015-10-15 DIAGNOSIS — J9691 Respiratory failure, unspecified with hypoxia: Secondary | ICD-10-CM | POA: Diagnosis not present

## 2015-10-15 DIAGNOSIS — Z9981 Dependence on supplemental oxygen: Secondary | ICD-10-CM | POA: Diagnosis not present

## 2015-10-15 DIAGNOSIS — D509 Iron deficiency anemia, unspecified: Secondary | ICD-10-CM | POA: Diagnosis not present

## 2015-10-15 DIAGNOSIS — D631 Anemia in chronic kidney disease: Secondary | ICD-10-CM | POA: Diagnosis not present

## 2015-10-16 DIAGNOSIS — Z992 Dependence on renal dialysis: Secondary | ICD-10-CM | POA: Diagnosis not present

## 2015-10-16 DIAGNOSIS — D631 Anemia in chronic kidney disease: Secondary | ICD-10-CM | POA: Diagnosis not present

## 2015-10-16 DIAGNOSIS — D509 Iron deficiency anemia, unspecified: Secondary | ICD-10-CM | POA: Diagnosis not present

## 2015-10-16 DIAGNOSIS — N186 End stage renal disease: Secondary | ICD-10-CM | POA: Diagnosis not present

## 2015-10-17 DIAGNOSIS — D509 Iron deficiency anemia, unspecified: Secondary | ICD-10-CM | POA: Diagnosis not present

## 2015-10-17 DIAGNOSIS — Z992 Dependence on renal dialysis: Secondary | ICD-10-CM | POA: Diagnosis not present

## 2015-10-17 DIAGNOSIS — N186 End stage renal disease: Secondary | ICD-10-CM | POA: Diagnosis not present

## 2015-10-17 DIAGNOSIS — D631 Anemia in chronic kidney disease: Secondary | ICD-10-CM | POA: Diagnosis not present

## 2015-10-18 DIAGNOSIS — R531 Weakness: Secondary | ICD-10-CM | POA: Diagnosis not present

## 2015-10-18 DIAGNOSIS — N186 End stage renal disease: Secondary | ICD-10-CM | POA: Diagnosis not present

## 2015-10-18 DIAGNOSIS — D509 Iron deficiency anemia, unspecified: Secondary | ICD-10-CM | POA: Diagnosis not present

## 2015-10-18 DIAGNOSIS — D631 Anemia in chronic kidney disease: Secondary | ICD-10-CM | POA: Diagnosis not present

## 2015-10-18 DIAGNOSIS — J9691 Respiratory failure, unspecified with hypoxia: Secondary | ICD-10-CM | POA: Diagnosis not present

## 2015-10-18 DIAGNOSIS — Z9181 History of falling: Secondary | ICD-10-CM | POA: Diagnosis not present

## 2015-10-18 DIAGNOSIS — I509 Heart failure, unspecified: Secondary | ICD-10-CM | POA: Diagnosis not present

## 2015-10-18 DIAGNOSIS — Z9981 Dependence on supplemental oxygen: Secondary | ICD-10-CM | POA: Diagnosis not present

## 2015-10-18 DIAGNOSIS — J441 Chronic obstructive pulmonary disease with (acute) exacerbation: Secondary | ICD-10-CM | POA: Diagnosis not present

## 2015-10-18 DIAGNOSIS — Z992 Dependence on renal dialysis: Secondary | ICD-10-CM | POA: Diagnosis not present

## 2015-10-19 DIAGNOSIS — D509 Iron deficiency anemia, unspecified: Secondary | ICD-10-CM | POA: Diagnosis not present

## 2015-10-19 DIAGNOSIS — D631 Anemia in chronic kidney disease: Secondary | ICD-10-CM | POA: Diagnosis not present

## 2015-10-19 DIAGNOSIS — N186 End stage renal disease: Secondary | ICD-10-CM | POA: Diagnosis not present

## 2015-10-19 DIAGNOSIS — Z992 Dependence on renal dialysis: Secondary | ICD-10-CM | POA: Diagnosis not present

## 2015-10-20 DIAGNOSIS — J441 Chronic obstructive pulmonary disease with (acute) exacerbation: Secondary | ICD-10-CM | POA: Diagnosis not present

## 2015-10-20 DIAGNOSIS — J9691 Respiratory failure, unspecified with hypoxia: Secondary | ICD-10-CM | POA: Diagnosis not present

## 2015-10-20 DIAGNOSIS — I509 Heart failure, unspecified: Secondary | ICD-10-CM | POA: Diagnosis not present

## 2015-10-20 DIAGNOSIS — D631 Anemia in chronic kidney disease: Secondary | ICD-10-CM | POA: Diagnosis not present

## 2015-10-20 DIAGNOSIS — Z9181 History of falling: Secondary | ICD-10-CM | POA: Diagnosis not present

## 2015-10-20 DIAGNOSIS — D509 Iron deficiency anemia, unspecified: Secondary | ICD-10-CM | POA: Diagnosis not present

## 2015-10-20 DIAGNOSIS — Z9981 Dependence on supplemental oxygen: Secondary | ICD-10-CM | POA: Diagnosis not present

## 2015-10-20 DIAGNOSIS — N186 End stage renal disease: Secondary | ICD-10-CM | POA: Diagnosis not present

## 2015-10-20 DIAGNOSIS — R531 Weakness: Secondary | ICD-10-CM | POA: Diagnosis not present

## 2015-10-20 DIAGNOSIS — Z992 Dependence on renal dialysis: Secondary | ICD-10-CM | POA: Diagnosis not present

## 2015-10-21 DIAGNOSIS — Z9981 Dependence on supplemental oxygen: Secondary | ICD-10-CM | POA: Diagnosis not present

## 2015-10-21 DIAGNOSIS — D631 Anemia in chronic kidney disease: Secondary | ICD-10-CM | POA: Diagnosis not present

## 2015-10-21 DIAGNOSIS — J441 Chronic obstructive pulmonary disease with (acute) exacerbation: Secondary | ICD-10-CM | POA: Diagnosis not present

## 2015-10-21 DIAGNOSIS — N186 End stage renal disease: Secondary | ICD-10-CM | POA: Diagnosis not present

## 2015-10-21 DIAGNOSIS — D509 Iron deficiency anemia, unspecified: Secondary | ICD-10-CM | POA: Diagnosis not present

## 2015-10-21 DIAGNOSIS — R531 Weakness: Secondary | ICD-10-CM | POA: Diagnosis not present

## 2015-10-21 DIAGNOSIS — Z992 Dependence on renal dialysis: Secondary | ICD-10-CM | POA: Diagnosis not present

## 2015-10-21 DIAGNOSIS — J9691 Respiratory failure, unspecified with hypoxia: Secondary | ICD-10-CM | POA: Diagnosis not present

## 2015-10-21 DIAGNOSIS — Z9181 History of falling: Secondary | ICD-10-CM | POA: Diagnosis not present

## 2015-10-21 DIAGNOSIS — I509 Heart failure, unspecified: Secondary | ICD-10-CM | POA: Diagnosis not present

## 2015-10-22 DIAGNOSIS — I509 Heart failure, unspecified: Secondary | ICD-10-CM | POA: Diagnosis not present

## 2015-10-22 DIAGNOSIS — Z9181 History of falling: Secondary | ICD-10-CM | POA: Diagnosis not present

## 2015-10-22 DIAGNOSIS — D631 Anemia in chronic kidney disease: Secondary | ICD-10-CM | POA: Diagnosis not present

## 2015-10-22 DIAGNOSIS — Z9981 Dependence on supplemental oxygen: Secondary | ICD-10-CM | POA: Diagnosis not present

## 2015-10-22 DIAGNOSIS — D509 Iron deficiency anemia, unspecified: Secondary | ICD-10-CM | POA: Diagnosis not present

## 2015-10-22 DIAGNOSIS — N186 End stage renal disease: Secondary | ICD-10-CM | POA: Diagnosis not present

## 2015-10-22 DIAGNOSIS — J441 Chronic obstructive pulmonary disease with (acute) exacerbation: Secondary | ICD-10-CM | POA: Diagnosis not present

## 2015-10-22 DIAGNOSIS — Z992 Dependence on renal dialysis: Secondary | ICD-10-CM | POA: Diagnosis not present

## 2015-10-22 DIAGNOSIS — J9691 Respiratory failure, unspecified with hypoxia: Secondary | ICD-10-CM | POA: Diagnosis not present

## 2015-10-22 DIAGNOSIS — R531 Weakness: Secondary | ICD-10-CM | POA: Diagnosis not present

## 2015-10-23 DIAGNOSIS — Z992 Dependence on renal dialysis: Secondary | ICD-10-CM | POA: Diagnosis not present

## 2015-10-23 DIAGNOSIS — D509 Iron deficiency anemia, unspecified: Secondary | ICD-10-CM | POA: Diagnosis not present

## 2015-10-23 DIAGNOSIS — D631 Anemia in chronic kidney disease: Secondary | ICD-10-CM | POA: Diagnosis not present

## 2015-10-23 DIAGNOSIS — N186 End stage renal disease: Secondary | ICD-10-CM | POA: Diagnosis not present

## 2015-10-24 DIAGNOSIS — N186 End stage renal disease: Secondary | ICD-10-CM | POA: Diagnosis not present

## 2015-10-24 DIAGNOSIS — Z992 Dependence on renal dialysis: Secondary | ICD-10-CM | POA: Diagnosis not present

## 2015-10-24 DIAGNOSIS — D631 Anemia in chronic kidney disease: Secondary | ICD-10-CM | POA: Diagnosis not present

## 2015-10-24 DIAGNOSIS — D509 Iron deficiency anemia, unspecified: Secondary | ICD-10-CM | POA: Diagnosis not present

## 2015-10-25 DIAGNOSIS — J441 Chronic obstructive pulmonary disease with (acute) exacerbation: Secondary | ICD-10-CM | POA: Diagnosis not present

## 2015-10-25 DIAGNOSIS — Z992 Dependence on renal dialysis: Secondary | ICD-10-CM | POA: Diagnosis not present

## 2015-10-25 DIAGNOSIS — D509 Iron deficiency anemia, unspecified: Secondary | ICD-10-CM | POA: Diagnosis not present

## 2015-10-25 DIAGNOSIS — Z9981 Dependence on supplemental oxygen: Secondary | ICD-10-CM | POA: Diagnosis not present

## 2015-10-25 DIAGNOSIS — D631 Anemia in chronic kidney disease: Secondary | ICD-10-CM | POA: Diagnosis not present

## 2015-10-25 DIAGNOSIS — I509 Heart failure, unspecified: Secondary | ICD-10-CM | POA: Diagnosis not present

## 2015-10-25 DIAGNOSIS — R531 Weakness: Secondary | ICD-10-CM | POA: Diagnosis not present

## 2015-10-25 DIAGNOSIS — Z9181 History of falling: Secondary | ICD-10-CM | POA: Diagnosis not present

## 2015-10-25 DIAGNOSIS — N186 End stage renal disease: Secondary | ICD-10-CM | POA: Diagnosis not present

## 2015-10-25 DIAGNOSIS — J9691 Respiratory failure, unspecified with hypoxia: Secondary | ICD-10-CM | POA: Diagnosis not present

## 2015-10-26 DIAGNOSIS — D509 Iron deficiency anemia, unspecified: Secondary | ICD-10-CM | POA: Diagnosis not present

## 2015-10-26 DIAGNOSIS — D631 Anemia in chronic kidney disease: Secondary | ICD-10-CM | POA: Diagnosis not present

## 2015-10-26 DIAGNOSIS — Z992 Dependence on renal dialysis: Secondary | ICD-10-CM | POA: Diagnosis not present

## 2015-10-26 DIAGNOSIS — N186 End stage renal disease: Secondary | ICD-10-CM | POA: Diagnosis not present

## 2015-10-27 DIAGNOSIS — Z9181 History of falling: Secondary | ICD-10-CM | POA: Diagnosis not present

## 2015-10-27 DIAGNOSIS — D631 Anemia in chronic kidney disease: Secondary | ICD-10-CM | POA: Diagnosis not present

## 2015-10-27 DIAGNOSIS — Z992 Dependence on renal dialysis: Secondary | ICD-10-CM | POA: Diagnosis not present

## 2015-10-27 DIAGNOSIS — N186 End stage renal disease: Secondary | ICD-10-CM | POA: Diagnosis not present

## 2015-10-27 DIAGNOSIS — J441 Chronic obstructive pulmonary disease with (acute) exacerbation: Secondary | ICD-10-CM | POA: Diagnosis not present

## 2015-10-27 DIAGNOSIS — R531 Weakness: Secondary | ICD-10-CM | POA: Diagnosis not present

## 2015-10-27 DIAGNOSIS — J9691 Respiratory failure, unspecified with hypoxia: Secondary | ICD-10-CM | POA: Diagnosis not present

## 2015-10-27 DIAGNOSIS — I509 Heart failure, unspecified: Secondary | ICD-10-CM | POA: Diagnosis not present

## 2015-10-27 DIAGNOSIS — D509 Iron deficiency anemia, unspecified: Secondary | ICD-10-CM | POA: Diagnosis not present

## 2015-10-27 DIAGNOSIS — Z9981 Dependence on supplemental oxygen: Secondary | ICD-10-CM | POA: Diagnosis not present

## 2015-10-28 DIAGNOSIS — R531 Weakness: Secondary | ICD-10-CM | POA: Diagnosis not present

## 2015-10-28 DIAGNOSIS — D631 Anemia in chronic kidney disease: Secondary | ICD-10-CM | POA: Diagnosis not present

## 2015-10-28 DIAGNOSIS — D509 Iron deficiency anemia, unspecified: Secondary | ICD-10-CM | POA: Diagnosis not present

## 2015-10-28 DIAGNOSIS — J9691 Respiratory failure, unspecified with hypoxia: Secondary | ICD-10-CM | POA: Diagnosis not present

## 2015-10-28 DIAGNOSIS — I509 Heart failure, unspecified: Secondary | ICD-10-CM | POA: Diagnosis not present

## 2015-10-28 DIAGNOSIS — Z9981 Dependence on supplemental oxygen: Secondary | ICD-10-CM | POA: Diagnosis not present

## 2015-10-28 DIAGNOSIS — N186 End stage renal disease: Secondary | ICD-10-CM | POA: Diagnosis not present

## 2015-10-28 DIAGNOSIS — J441 Chronic obstructive pulmonary disease with (acute) exacerbation: Secondary | ICD-10-CM | POA: Diagnosis not present

## 2015-10-28 DIAGNOSIS — Z9181 History of falling: Secondary | ICD-10-CM | POA: Diagnosis not present

## 2015-10-28 DIAGNOSIS — Z992 Dependence on renal dialysis: Secondary | ICD-10-CM | POA: Diagnosis not present

## 2015-10-29 DIAGNOSIS — D631 Anemia in chronic kidney disease: Secondary | ICD-10-CM | POA: Diagnosis not present

## 2015-10-29 DIAGNOSIS — R531 Weakness: Secondary | ICD-10-CM | POA: Diagnosis not present

## 2015-10-29 DIAGNOSIS — J9691 Respiratory failure, unspecified with hypoxia: Secondary | ICD-10-CM | POA: Diagnosis not present

## 2015-10-29 DIAGNOSIS — N186 End stage renal disease: Secondary | ICD-10-CM | POA: Diagnosis not present

## 2015-10-29 DIAGNOSIS — I509 Heart failure, unspecified: Secondary | ICD-10-CM | POA: Diagnosis not present

## 2015-10-29 DIAGNOSIS — Z992 Dependence on renal dialysis: Secondary | ICD-10-CM | POA: Diagnosis not present

## 2015-10-29 DIAGNOSIS — J441 Chronic obstructive pulmonary disease with (acute) exacerbation: Secondary | ICD-10-CM | POA: Diagnosis not present

## 2015-10-29 DIAGNOSIS — Z9181 History of falling: Secondary | ICD-10-CM | POA: Diagnosis not present

## 2015-10-29 DIAGNOSIS — D509 Iron deficiency anemia, unspecified: Secondary | ICD-10-CM | POA: Diagnosis not present

## 2015-10-29 DIAGNOSIS — Z9981 Dependence on supplemental oxygen: Secondary | ICD-10-CM | POA: Diagnosis not present

## 2015-10-30 DIAGNOSIS — N186 End stage renal disease: Secondary | ICD-10-CM | POA: Diagnosis not present

## 2015-10-30 DIAGNOSIS — D631 Anemia in chronic kidney disease: Secondary | ICD-10-CM | POA: Diagnosis not present

## 2015-10-30 DIAGNOSIS — D509 Iron deficiency anemia, unspecified: Secondary | ICD-10-CM | POA: Diagnosis not present

## 2015-10-30 DIAGNOSIS — Z992 Dependence on renal dialysis: Secondary | ICD-10-CM | POA: Diagnosis not present

## 2015-10-31 DIAGNOSIS — D509 Iron deficiency anemia, unspecified: Secondary | ICD-10-CM | POA: Diagnosis not present

## 2015-10-31 DIAGNOSIS — Z992 Dependence on renal dialysis: Secondary | ICD-10-CM | POA: Diagnosis not present

## 2015-10-31 DIAGNOSIS — D631 Anemia in chronic kidney disease: Secondary | ICD-10-CM | POA: Diagnosis not present

## 2015-10-31 DIAGNOSIS — N186 End stage renal disease: Secondary | ICD-10-CM | POA: Diagnosis not present

## 2015-11-01 DIAGNOSIS — D509 Iron deficiency anemia, unspecified: Secondary | ICD-10-CM | POA: Diagnosis not present

## 2015-11-01 DIAGNOSIS — J441 Chronic obstructive pulmonary disease with (acute) exacerbation: Secondary | ICD-10-CM | POA: Diagnosis not present

## 2015-11-01 DIAGNOSIS — Z992 Dependence on renal dialysis: Secondary | ICD-10-CM | POA: Diagnosis not present

## 2015-11-01 DIAGNOSIS — N186 End stage renal disease: Secondary | ICD-10-CM | POA: Diagnosis not present

## 2015-11-01 DIAGNOSIS — Z9981 Dependence on supplemental oxygen: Secondary | ICD-10-CM | POA: Diagnosis not present

## 2015-11-01 DIAGNOSIS — Z9181 History of falling: Secondary | ICD-10-CM | POA: Diagnosis not present

## 2015-11-01 DIAGNOSIS — I509 Heart failure, unspecified: Secondary | ICD-10-CM | POA: Diagnosis not present

## 2015-11-01 DIAGNOSIS — D631 Anemia in chronic kidney disease: Secondary | ICD-10-CM | POA: Diagnosis not present

## 2015-11-01 DIAGNOSIS — R531 Weakness: Secondary | ICD-10-CM | POA: Diagnosis not present

## 2015-11-01 DIAGNOSIS — J9691 Respiratory failure, unspecified with hypoxia: Secondary | ICD-10-CM | POA: Diagnosis not present

## 2015-11-02 DIAGNOSIS — D509 Iron deficiency anemia, unspecified: Secondary | ICD-10-CM | POA: Diagnosis not present

## 2015-11-02 DIAGNOSIS — D631 Anemia in chronic kidney disease: Secondary | ICD-10-CM | POA: Diagnosis not present

## 2015-11-02 DIAGNOSIS — N186 End stage renal disease: Secondary | ICD-10-CM | POA: Diagnosis not present

## 2015-11-02 DIAGNOSIS — Z992 Dependence on renal dialysis: Secondary | ICD-10-CM | POA: Diagnosis not present

## 2015-11-03 DIAGNOSIS — Z992 Dependence on renal dialysis: Secondary | ICD-10-CM | POA: Diagnosis not present

## 2015-11-03 DIAGNOSIS — D631 Anemia in chronic kidney disease: Secondary | ICD-10-CM | POA: Diagnosis not present

## 2015-11-03 DIAGNOSIS — I509 Heart failure, unspecified: Secondary | ICD-10-CM | POA: Diagnosis not present

## 2015-11-03 DIAGNOSIS — N186 End stage renal disease: Secondary | ICD-10-CM | POA: Diagnosis not present

## 2015-11-03 DIAGNOSIS — D509 Iron deficiency anemia, unspecified: Secondary | ICD-10-CM | POA: Diagnosis not present

## 2015-11-03 DIAGNOSIS — Z9181 History of falling: Secondary | ICD-10-CM | POA: Diagnosis not present

## 2015-11-03 DIAGNOSIS — R531 Weakness: Secondary | ICD-10-CM | POA: Diagnosis not present

## 2015-11-03 DIAGNOSIS — J9691 Respiratory failure, unspecified with hypoxia: Secondary | ICD-10-CM | POA: Diagnosis not present

## 2015-11-03 DIAGNOSIS — J441 Chronic obstructive pulmonary disease with (acute) exacerbation: Secondary | ICD-10-CM | POA: Diagnosis not present

## 2015-11-03 DIAGNOSIS — Z9981 Dependence on supplemental oxygen: Secondary | ICD-10-CM | POA: Diagnosis not present

## 2015-11-04 DIAGNOSIS — J441 Chronic obstructive pulmonary disease with (acute) exacerbation: Secondary | ICD-10-CM | POA: Diagnosis not present

## 2015-11-04 DIAGNOSIS — R531 Weakness: Secondary | ICD-10-CM | POA: Diagnosis not present

## 2015-11-04 DIAGNOSIS — Z9181 History of falling: Secondary | ICD-10-CM | POA: Diagnosis not present

## 2015-11-04 DIAGNOSIS — N186 End stage renal disease: Secondary | ICD-10-CM | POA: Diagnosis not present

## 2015-11-04 DIAGNOSIS — I509 Heart failure, unspecified: Secondary | ICD-10-CM | POA: Diagnosis not present

## 2015-11-04 DIAGNOSIS — D509 Iron deficiency anemia, unspecified: Secondary | ICD-10-CM | POA: Diagnosis not present

## 2015-11-04 DIAGNOSIS — D631 Anemia in chronic kidney disease: Secondary | ICD-10-CM | POA: Diagnosis not present

## 2015-11-04 DIAGNOSIS — Z9981 Dependence on supplemental oxygen: Secondary | ICD-10-CM | POA: Diagnosis not present

## 2015-11-04 DIAGNOSIS — J9691 Respiratory failure, unspecified with hypoxia: Secondary | ICD-10-CM | POA: Diagnosis not present

## 2015-11-04 DIAGNOSIS — Z992 Dependence on renal dialysis: Secondary | ICD-10-CM | POA: Diagnosis not present

## 2015-11-05 DIAGNOSIS — Z9181 History of falling: Secondary | ICD-10-CM | POA: Diagnosis not present

## 2015-11-05 DIAGNOSIS — Z992 Dependence on renal dialysis: Secondary | ICD-10-CM | POA: Diagnosis not present

## 2015-11-05 DIAGNOSIS — D631 Anemia in chronic kidney disease: Secondary | ICD-10-CM | POA: Diagnosis not present

## 2015-11-05 DIAGNOSIS — Z9981 Dependence on supplemental oxygen: Secondary | ICD-10-CM | POA: Diagnosis not present

## 2015-11-05 DIAGNOSIS — R531 Weakness: Secondary | ICD-10-CM | POA: Diagnosis not present

## 2015-11-05 DIAGNOSIS — R627 Adult failure to thrive: Secondary | ICD-10-CM | POA: Diagnosis not present

## 2015-11-05 DIAGNOSIS — D509 Iron deficiency anemia, unspecified: Secondary | ICD-10-CM | POA: Diagnosis not present

## 2015-11-05 DIAGNOSIS — Z853 Personal history of malignant neoplasm of breast: Secondary | ICD-10-CM | POA: Diagnosis not present

## 2015-11-05 DIAGNOSIS — Z23 Encounter for immunization: Secondary | ICD-10-CM | POA: Diagnosis not present

## 2015-11-05 DIAGNOSIS — J9691 Respiratory failure, unspecified with hypoxia: Secondary | ICD-10-CM | POA: Diagnosis not present

## 2015-11-05 DIAGNOSIS — J441 Chronic obstructive pulmonary disease with (acute) exacerbation: Secondary | ICD-10-CM | POA: Diagnosis not present

## 2015-11-05 DIAGNOSIS — Z812 Family history of tobacco abuse and dependence: Secondary | ICD-10-CM | POA: Diagnosis not present

## 2015-11-05 DIAGNOSIS — N186 End stage renal disease: Secondary | ICD-10-CM | POA: Diagnosis not present

## 2015-11-05 DIAGNOSIS — I509 Heart failure, unspecified: Secondary | ICD-10-CM | POA: Diagnosis not present

## 2015-11-05 DIAGNOSIS — Z923 Personal history of irradiation: Secondary | ICD-10-CM | POA: Diagnosis not present

## 2015-11-08 DIAGNOSIS — J9691 Respiratory failure, unspecified with hypoxia: Secondary | ICD-10-CM | POA: Diagnosis not present

## 2015-11-08 DIAGNOSIS — Z9981 Dependence on supplemental oxygen: Secondary | ICD-10-CM | POA: Diagnosis not present

## 2015-11-08 DIAGNOSIS — I509 Heart failure, unspecified: Secondary | ICD-10-CM | POA: Diagnosis not present

## 2015-11-08 DIAGNOSIS — Z9181 History of falling: Secondary | ICD-10-CM | POA: Diagnosis not present

## 2015-11-08 DIAGNOSIS — J441 Chronic obstructive pulmonary disease with (acute) exacerbation: Secondary | ICD-10-CM | POA: Diagnosis not present

## 2015-11-08 DIAGNOSIS — R531 Weakness: Secondary | ICD-10-CM | POA: Diagnosis not present

## 2015-11-10 DIAGNOSIS — Z9181 History of falling: Secondary | ICD-10-CM | POA: Diagnosis not present

## 2015-11-10 DIAGNOSIS — J9691 Respiratory failure, unspecified with hypoxia: Secondary | ICD-10-CM | POA: Diagnosis not present

## 2015-11-10 DIAGNOSIS — Z9981 Dependence on supplemental oxygen: Secondary | ICD-10-CM | POA: Diagnosis not present

## 2015-11-10 DIAGNOSIS — R531 Weakness: Secondary | ICD-10-CM | POA: Diagnosis not present

## 2015-11-10 DIAGNOSIS — I509 Heart failure, unspecified: Secondary | ICD-10-CM | POA: Diagnosis not present

## 2015-11-10 DIAGNOSIS — J441 Chronic obstructive pulmonary disease with (acute) exacerbation: Secondary | ICD-10-CM | POA: Diagnosis not present

## 2015-11-11 DIAGNOSIS — I509 Heart failure, unspecified: Secondary | ICD-10-CM | POA: Diagnosis not present

## 2015-11-11 DIAGNOSIS — Z9981 Dependence on supplemental oxygen: Secondary | ICD-10-CM | POA: Diagnosis not present

## 2015-11-11 DIAGNOSIS — J9691 Respiratory failure, unspecified with hypoxia: Secondary | ICD-10-CM | POA: Diagnosis not present

## 2015-11-11 DIAGNOSIS — Z9181 History of falling: Secondary | ICD-10-CM | POA: Diagnosis not present

## 2015-11-11 DIAGNOSIS — J441 Chronic obstructive pulmonary disease with (acute) exacerbation: Secondary | ICD-10-CM | POA: Diagnosis not present

## 2015-11-11 DIAGNOSIS — R531 Weakness: Secondary | ICD-10-CM | POA: Diagnosis not present

## 2015-11-12 DIAGNOSIS — J441 Chronic obstructive pulmonary disease with (acute) exacerbation: Secondary | ICD-10-CM | POA: Diagnosis not present

## 2015-11-12 DIAGNOSIS — I509 Heart failure, unspecified: Secondary | ICD-10-CM | POA: Diagnosis not present

## 2015-11-12 DIAGNOSIS — Z9181 History of falling: Secondary | ICD-10-CM | POA: Diagnosis not present

## 2015-11-12 DIAGNOSIS — J9691 Respiratory failure, unspecified with hypoxia: Secondary | ICD-10-CM | POA: Diagnosis not present

## 2015-11-12 DIAGNOSIS — Z9981 Dependence on supplemental oxygen: Secondary | ICD-10-CM | POA: Diagnosis not present

## 2015-11-12 DIAGNOSIS — R531 Weakness: Secondary | ICD-10-CM | POA: Diagnosis not present

## 2015-11-15 DIAGNOSIS — R531 Weakness: Secondary | ICD-10-CM | POA: Diagnosis not present

## 2015-11-15 DIAGNOSIS — Z9181 History of falling: Secondary | ICD-10-CM | POA: Diagnosis not present

## 2015-11-15 DIAGNOSIS — J9691 Respiratory failure, unspecified with hypoxia: Secondary | ICD-10-CM | POA: Diagnosis not present

## 2015-11-15 DIAGNOSIS — I509 Heart failure, unspecified: Secondary | ICD-10-CM | POA: Diagnosis not present

## 2015-11-15 DIAGNOSIS — Z9981 Dependence on supplemental oxygen: Secondary | ICD-10-CM | POA: Diagnosis not present

## 2015-11-15 DIAGNOSIS — J441 Chronic obstructive pulmonary disease with (acute) exacerbation: Secondary | ICD-10-CM | POA: Diagnosis not present

## 2015-11-16 DIAGNOSIS — R531 Weakness: Secondary | ICD-10-CM | POA: Diagnosis not present

## 2015-11-16 DIAGNOSIS — J9691 Respiratory failure, unspecified with hypoxia: Secondary | ICD-10-CM | POA: Diagnosis not present

## 2015-11-16 DIAGNOSIS — I509 Heart failure, unspecified: Secondary | ICD-10-CM | POA: Diagnosis not present

## 2015-11-16 DIAGNOSIS — Z9181 History of falling: Secondary | ICD-10-CM | POA: Diagnosis not present

## 2015-11-16 DIAGNOSIS — Z9981 Dependence on supplemental oxygen: Secondary | ICD-10-CM | POA: Diagnosis not present

## 2015-11-16 DIAGNOSIS — J441 Chronic obstructive pulmonary disease with (acute) exacerbation: Secondary | ICD-10-CM | POA: Diagnosis not present

## 2015-11-17 DIAGNOSIS — R531 Weakness: Secondary | ICD-10-CM | POA: Diagnosis not present

## 2015-11-17 DIAGNOSIS — I509 Heart failure, unspecified: Secondary | ICD-10-CM | POA: Diagnosis not present

## 2015-11-17 DIAGNOSIS — J441 Chronic obstructive pulmonary disease with (acute) exacerbation: Secondary | ICD-10-CM | POA: Diagnosis not present

## 2015-11-17 DIAGNOSIS — Z9181 History of falling: Secondary | ICD-10-CM | POA: Diagnosis not present

## 2015-11-17 DIAGNOSIS — Z9981 Dependence on supplemental oxygen: Secondary | ICD-10-CM | POA: Diagnosis not present

## 2015-11-17 DIAGNOSIS — J9691 Respiratory failure, unspecified with hypoxia: Secondary | ICD-10-CM | POA: Diagnosis not present

## 2015-11-18 DIAGNOSIS — Z992 Dependence on renal dialysis: Secondary | ICD-10-CM | POA: Diagnosis not present

## 2015-11-19 DIAGNOSIS — J9691 Respiratory failure, unspecified with hypoxia: Secondary | ICD-10-CM | POA: Diagnosis not present

## 2015-11-19 DIAGNOSIS — R531 Weakness: Secondary | ICD-10-CM | POA: Diagnosis not present

## 2015-11-19 DIAGNOSIS — Z9981 Dependence on supplemental oxygen: Secondary | ICD-10-CM | POA: Diagnosis not present

## 2015-11-19 DIAGNOSIS — I509 Heart failure, unspecified: Secondary | ICD-10-CM | POA: Diagnosis not present

## 2015-11-19 DIAGNOSIS — Z9181 History of falling: Secondary | ICD-10-CM | POA: Diagnosis not present

## 2015-11-19 DIAGNOSIS — J441 Chronic obstructive pulmonary disease with (acute) exacerbation: Secondary | ICD-10-CM | POA: Diagnosis not present

## 2015-11-22 DIAGNOSIS — R531 Weakness: Secondary | ICD-10-CM | POA: Diagnosis not present

## 2015-11-22 DIAGNOSIS — J9691 Respiratory failure, unspecified with hypoxia: Secondary | ICD-10-CM | POA: Diagnosis not present

## 2015-11-22 DIAGNOSIS — Z9181 History of falling: Secondary | ICD-10-CM | POA: Diagnosis not present

## 2015-11-22 DIAGNOSIS — J441 Chronic obstructive pulmonary disease with (acute) exacerbation: Secondary | ICD-10-CM | POA: Diagnosis not present

## 2015-11-22 DIAGNOSIS — Z9981 Dependence on supplemental oxygen: Secondary | ICD-10-CM | POA: Diagnosis not present

## 2015-11-22 DIAGNOSIS — I509 Heart failure, unspecified: Secondary | ICD-10-CM | POA: Diagnosis not present

## 2015-11-23 DIAGNOSIS — R531 Weakness: Secondary | ICD-10-CM | POA: Diagnosis not present

## 2015-11-23 DIAGNOSIS — Z9981 Dependence on supplemental oxygen: Secondary | ICD-10-CM | POA: Diagnosis not present

## 2015-11-23 DIAGNOSIS — J441 Chronic obstructive pulmonary disease with (acute) exacerbation: Secondary | ICD-10-CM | POA: Diagnosis not present

## 2015-11-23 DIAGNOSIS — I509 Heart failure, unspecified: Secondary | ICD-10-CM | POA: Diagnosis not present

## 2015-11-23 DIAGNOSIS — J9691 Respiratory failure, unspecified with hypoxia: Secondary | ICD-10-CM | POA: Diagnosis not present

## 2015-11-23 DIAGNOSIS — Z9181 History of falling: Secondary | ICD-10-CM | POA: Diagnosis not present

## 2015-11-24 DIAGNOSIS — Z9981 Dependence on supplemental oxygen: Secondary | ICD-10-CM | POA: Diagnosis not present

## 2015-11-24 DIAGNOSIS — Z9181 History of falling: Secondary | ICD-10-CM | POA: Diagnosis not present

## 2015-11-24 DIAGNOSIS — J441 Chronic obstructive pulmonary disease with (acute) exacerbation: Secondary | ICD-10-CM | POA: Diagnosis not present

## 2015-11-24 DIAGNOSIS — J9691 Respiratory failure, unspecified with hypoxia: Secondary | ICD-10-CM | POA: Diagnosis not present

## 2015-11-24 DIAGNOSIS — I509 Heart failure, unspecified: Secondary | ICD-10-CM | POA: Diagnosis not present

## 2015-11-24 DIAGNOSIS — R531 Weakness: Secondary | ICD-10-CM | POA: Diagnosis not present

## 2015-11-25 DIAGNOSIS — J9691 Respiratory failure, unspecified with hypoxia: Secondary | ICD-10-CM | POA: Diagnosis not present

## 2015-11-25 DIAGNOSIS — Z9981 Dependence on supplemental oxygen: Secondary | ICD-10-CM | POA: Diagnosis not present

## 2015-11-25 DIAGNOSIS — I509 Heart failure, unspecified: Secondary | ICD-10-CM | POA: Diagnosis not present

## 2015-11-25 DIAGNOSIS — Z9181 History of falling: Secondary | ICD-10-CM | POA: Diagnosis not present

## 2015-11-25 DIAGNOSIS — J441 Chronic obstructive pulmonary disease with (acute) exacerbation: Secondary | ICD-10-CM | POA: Diagnosis not present

## 2015-11-25 DIAGNOSIS — R531 Weakness: Secondary | ICD-10-CM | POA: Diagnosis not present

## 2015-11-26 DIAGNOSIS — I509 Heart failure, unspecified: Secondary | ICD-10-CM | POA: Diagnosis not present

## 2015-11-26 DIAGNOSIS — J441 Chronic obstructive pulmonary disease with (acute) exacerbation: Secondary | ICD-10-CM | POA: Diagnosis not present

## 2015-11-26 DIAGNOSIS — Z9981 Dependence on supplemental oxygen: Secondary | ICD-10-CM | POA: Diagnosis not present

## 2015-11-26 DIAGNOSIS — J9691 Respiratory failure, unspecified with hypoxia: Secondary | ICD-10-CM | POA: Diagnosis not present

## 2015-11-26 DIAGNOSIS — Z9181 History of falling: Secondary | ICD-10-CM | POA: Diagnosis not present

## 2015-11-26 DIAGNOSIS — R531 Weakness: Secondary | ICD-10-CM | POA: Diagnosis not present

## 2015-11-29 DIAGNOSIS — R531 Weakness: Secondary | ICD-10-CM | POA: Diagnosis not present

## 2015-11-29 DIAGNOSIS — J9691 Respiratory failure, unspecified with hypoxia: Secondary | ICD-10-CM | POA: Diagnosis not present

## 2015-11-29 DIAGNOSIS — J441 Chronic obstructive pulmonary disease with (acute) exacerbation: Secondary | ICD-10-CM | POA: Diagnosis not present

## 2015-11-29 DIAGNOSIS — I509 Heart failure, unspecified: Secondary | ICD-10-CM | POA: Diagnosis not present

## 2015-11-29 DIAGNOSIS — Z9981 Dependence on supplemental oxygen: Secondary | ICD-10-CM | POA: Diagnosis not present

## 2015-11-29 DIAGNOSIS — Z9181 History of falling: Secondary | ICD-10-CM | POA: Diagnosis not present

## 2015-12-01 DIAGNOSIS — J441 Chronic obstructive pulmonary disease with (acute) exacerbation: Secondary | ICD-10-CM | POA: Diagnosis not present

## 2015-12-01 DIAGNOSIS — R531 Weakness: Secondary | ICD-10-CM | POA: Diagnosis not present

## 2015-12-01 DIAGNOSIS — J9691 Respiratory failure, unspecified with hypoxia: Secondary | ICD-10-CM | POA: Diagnosis not present

## 2015-12-01 DIAGNOSIS — I509 Heart failure, unspecified: Secondary | ICD-10-CM | POA: Diagnosis not present

## 2015-12-01 DIAGNOSIS — Z9181 History of falling: Secondary | ICD-10-CM | POA: Diagnosis not present

## 2015-12-01 DIAGNOSIS — Z9981 Dependence on supplemental oxygen: Secondary | ICD-10-CM | POA: Diagnosis not present

## 2015-12-03 DIAGNOSIS — I509 Heart failure, unspecified: Secondary | ICD-10-CM | POA: Diagnosis not present

## 2015-12-03 DIAGNOSIS — R531 Weakness: Secondary | ICD-10-CM | POA: Diagnosis not present

## 2015-12-03 DIAGNOSIS — Z9181 History of falling: Secondary | ICD-10-CM | POA: Diagnosis not present

## 2015-12-03 DIAGNOSIS — J9691 Respiratory failure, unspecified with hypoxia: Secondary | ICD-10-CM | POA: Diagnosis not present

## 2015-12-03 DIAGNOSIS — Z9981 Dependence on supplemental oxygen: Secondary | ICD-10-CM | POA: Diagnosis not present

## 2015-12-03 DIAGNOSIS — J441 Chronic obstructive pulmonary disease with (acute) exacerbation: Secondary | ICD-10-CM | POA: Diagnosis not present

## 2015-12-04 DIAGNOSIS — Z992 Dependence on renal dialysis: Secondary | ICD-10-CM | POA: Diagnosis not present

## 2015-12-04 DIAGNOSIS — N186 End stage renal disease: Secondary | ICD-10-CM | POA: Diagnosis not present

## 2015-12-05 DIAGNOSIS — N186 End stage renal disease: Secondary | ICD-10-CM | POA: Diagnosis not present

## 2015-12-05 DIAGNOSIS — N2581 Secondary hyperparathyroidism of renal origin: Secondary | ICD-10-CM | POA: Diagnosis not present

## 2015-12-05 DIAGNOSIS — Z9181 History of falling: Secondary | ICD-10-CM | POA: Diagnosis not present

## 2015-12-05 DIAGNOSIS — I509 Heart failure, unspecified: Secondary | ICD-10-CM | POA: Diagnosis not present

## 2015-12-05 DIAGNOSIS — R531 Weakness: Secondary | ICD-10-CM | POA: Diagnosis not present

## 2015-12-05 DIAGNOSIS — Z923 Personal history of irradiation: Secondary | ICD-10-CM | POA: Diagnosis not present

## 2015-12-05 DIAGNOSIS — Z992 Dependence on renal dialysis: Secondary | ICD-10-CM | POA: Diagnosis not present

## 2015-12-05 DIAGNOSIS — D509 Iron deficiency anemia, unspecified: Secondary | ICD-10-CM | POA: Diagnosis not present

## 2015-12-05 DIAGNOSIS — Z853 Personal history of malignant neoplasm of breast: Secondary | ICD-10-CM | POA: Diagnosis not present

## 2015-12-05 DIAGNOSIS — Z812 Family history of tobacco abuse and dependence: Secondary | ICD-10-CM | POA: Diagnosis not present

## 2015-12-05 DIAGNOSIS — R627 Adult failure to thrive: Secondary | ICD-10-CM | POA: Diagnosis not present

## 2015-12-05 DIAGNOSIS — J9691 Respiratory failure, unspecified with hypoxia: Secondary | ICD-10-CM | POA: Diagnosis not present

## 2015-12-05 DIAGNOSIS — J441 Chronic obstructive pulmonary disease with (acute) exacerbation: Secondary | ICD-10-CM | POA: Diagnosis not present

## 2015-12-05 DIAGNOSIS — Z9981 Dependence on supplemental oxygen: Secondary | ICD-10-CM | POA: Diagnosis not present

## 2015-12-06 DIAGNOSIS — N2581 Secondary hyperparathyroidism of renal origin: Secondary | ICD-10-CM | POA: Diagnosis not present

## 2015-12-06 DIAGNOSIS — J441 Chronic obstructive pulmonary disease with (acute) exacerbation: Secondary | ICD-10-CM | POA: Diagnosis not present

## 2015-12-06 DIAGNOSIS — I509 Heart failure, unspecified: Secondary | ICD-10-CM | POA: Diagnosis not present

## 2015-12-06 DIAGNOSIS — N186 End stage renal disease: Secondary | ICD-10-CM | POA: Diagnosis not present

## 2015-12-06 DIAGNOSIS — R531 Weakness: Secondary | ICD-10-CM | POA: Diagnosis not present

## 2015-12-06 DIAGNOSIS — Z992 Dependence on renal dialysis: Secondary | ICD-10-CM | POA: Diagnosis not present

## 2015-12-06 DIAGNOSIS — Z9981 Dependence on supplemental oxygen: Secondary | ICD-10-CM | POA: Diagnosis not present

## 2015-12-06 DIAGNOSIS — D509 Iron deficiency anemia, unspecified: Secondary | ICD-10-CM | POA: Diagnosis not present

## 2015-12-06 DIAGNOSIS — J9691 Respiratory failure, unspecified with hypoxia: Secondary | ICD-10-CM | POA: Diagnosis not present

## 2015-12-06 DIAGNOSIS — Z9181 History of falling: Secondary | ICD-10-CM | POA: Diagnosis not present

## 2015-12-07 DIAGNOSIS — I509 Heart failure, unspecified: Secondary | ICD-10-CM | POA: Diagnosis not present

## 2015-12-07 DIAGNOSIS — J9691 Respiratory failure, unspecified with hypoxia: Secondary | ICD-10-CM | POA: Diagnosis not present

## 2015-12-07 DIAGNOSIS — Z9981 Dependence on supplemental oxygen: Secondary | ICD-10-CM | POA: Diagnosis not present

## 2015-12-07 DIAGNOSIS — Z9181 History of falling: Secondary | ICD-10-CM | POA: Diagnosis not present

## 2015-12-07 DIAGNOSIS — Z992 Dependence on renal dialysis: Secondary | ICD-10-CM | POA: Diagnosis not present

## 2015-12-07 DIAGNOSIS — N186 End stage renal disease: Secondary | ICD-10-CM | POA: Diagnosis not present

## 2015-12-07 DIAGNOSIS — N2581 Secondary hyperparathyroidism of renal origin: Secondary | ICD-10-CM | POA: Diagnosis not present

## 2015-12-07 DIAGNOSIS — J441 Chronic obstructive pulmonary disease with (acute) exacerbation: Secondary | ICD-10-CM | POA: Diagnosis not present

## 2015-12-07 DIAGNOSIS — R531 Weakness: Secondary | ICD-10-CM | POA: Diagnosis not present

## 2015-12-07 DIAGNOSIS — D509 Iron deficiency anemia, unspecified: Secondary | ICD-10-CM | POA: Diagnosis not present

## 2015-12-08 DIAGNOSIS — Z992 Dependence on renal dialysis: Secondary | ICD-10-CM | POA: Diagnosis not present

## 2015-12-08 DIAGNOSIS — Z9981 Dependence on supplemental oxygen: Secondary | ICD-10-CM | POA: Diagnosis not present

## 2015-12-08 DIAGNOSIS — R531 Weakness: Secondary | ICD-10-CM | POA: Diagnosis not present

## 2015-12-08 DIAGNOSIS — I509 Heart failure, unspecified: Secondary | ICD-10-CM | POA: Diagnosis not present

## 2015-12-08 DIAGNOSIS — J9691 Respiratory failure, unspecified with hypoxia: Secondary | ICD-10-CM | POA: Diagnosis not present

## 2015-12-08 DIAGNOSIS — J441 Chronic obstructive pulmonary disease with (acute) exacerbation: Secondary | ICD-10-CM | POA: Diagnosis not present

## 2015-12-08 DIAGNOSIS — Z9181 History of falling: Secondary | ICD-10-CM | POA: Diagnosis not present

## 2015-12-08 DIAGNOSIS — N186 End stage renal disease: Secondary | ICD-10-CM | POA: Diagnosis not present

## 2015-12-08 DIAGNOSIS — N2581 Secondary hyperparathyroidism of renal origin: Secondary | ICD-10-CM | POA: Diagnosis not present

## 2015-12-08 DIAGNOSIS — D509 Iron deficiency anemia, unspecified: Secondary | ICD-10-CM | POA: Diagnosis not present

## 2015-12-09 DIAGNOSIS — Z992 Dependence on renal dialysis: Secondary | ICD-10-CM | POA: Diagnosis not present

## 2015-12-09 DIAGNOSIS — N2581 Secondary hyperparathyroidism of renal origin: Secondary | ICD-10-CM | POA: Diagnosis not present

## 2015-12-09 DIAGNOSIS — N186 End stage renal disease: Secondary | ICD-10-CM | POA: Diagnosis not present

## 2015-12-09 DIAGNOSIS — D509 Iron deficiency anemia, unspecified: Secondary | ICD-10-CM | POA: Diagnosis not present

## 2015-12-10 DIAGNOSIS — J9691 Respiratory failure, unspecified with hypoxia: Secondary | ICD-10-CM | POA: Diagnosis not present

## 2015-12-10 DIAGNOSIS — N2581 Secondary hyperparathyroidism of renal origin: Secondary | ICD-10-CM | POA: Diagnosis not present

## 2015-12-10 DIAGNOSIS — Z992 Dependence on renal dialysis: Secondary | ICD-10-CM | POA: Diagnosis not present

## 2015-12-10 DIAGNOSIS — D509 Iron deficiency anemia, unspecified: Secondary | ICD-10-CM | POA: Diagnosis not present

## 2015-12-10 DIAGNOSIS — R531 Weakness: Secondary | ICD-10-CM | POA: Diagnosis not present

## 2015-12-10 DIAGNOSIS — Z9181 History of falling: Secondary | ICD-10-CM | POA: Diagnosis not present

## 2015-12-10 DIAGNOSIS — I509 Heart failure, unspecified: Secondary | ICD-10-CM | POA: Diagnosis not present

## 2015-12-10 DIAGNOSIS — J441 Chronic obstructive pulmonary disease with (acute) exacerbation: Secondary | ICD-10-CM | POA: Diagnosis not present

## 2015-12-10 DIAGNOSIS — Z9981 Dependence on supplemental oxygen: Secondary | ICD-10-CM | POA: Diagnosis not present

## 2015-12-10 DIAGNOSIS — N186 End stage renal disease: Secondary | ICD-10-CM | POA: Diagnosis not present

## 2015-12-11 DIAGNOSIS — N186 End stage renal disease: Secondary | ICD-10-CM | POA: Diagnosis not present

## 2015-12-11 DIAGNOSIS — N2581 Secondary hyperparathyroidism of renal origin: Secondary | ICD-10-CM | POA: Diagnosis not present

## 2015-12-11 DIAGNOSIS — D509 Iron deficiency anemia, unspecified: Secondary | ICD-10-CM | POA: Diagnosis not present

## 2015-12-11 DIAGNOSIS — Z992 Dependence on renal dialysis: Secondary | ICD-10-CM | POA: Diagnosis not present

## 2015-12-12 DIAGNOSIS — D509 Iron deficiency anemia, unspecified: Secondary | ICD-10-CM | POA: Diagnosis not present

## 2015-12-12 DIAGNOSIS — Z992 Dependence on renal dialysis: Secondary | ICD-10-CM | POA: Diagnosis not present

## 2015-12-12 DIAGNOSIS — N2581 Secondary hyperparathyroidism of renal origin: Secondary | ICD-10-CM | POA: Diagnosis not present

## 2015-12-12 DIAGNOSIS — N186 End stage renal disease: Secondary | ICD-10-CM | POA: Diagnosis not present

## 2015-12-13 DIAGNOSIS — Z9981 Dependence on supplemental oxygen: Secondary | ICD-10-CM | POA: Diagnosis not present

## 2015-12-13 DIAGNOSIS — D509 Iron deficiency anemia, unspecified: Secondary | ICD-10-CM | POA: Diagnosis not present

## 2015-12-13 DIAGNOSIS — I509 Heart failure, unspecified: Secondary | ICD-10-CM | POA: Diagnosis not present

## 2015-12-13 DIAGNOSIS — R531 Weakness: Secondary | ICD-10-CM | POA: Diagnosis not present

## 2015-12-13 DIAGNOSIS — N2581 Secondary hyperparathyroidism of renal origin: Secondary | ICD-10-CM | POA: Diagnosis not present

## 2015-12-13 DIAGNOSIS — Z992 Dependence on renal dialysis: Secondary | ICD-10-CM | POA: Diagnosis not present

## 2015-12-13 DIAGNOSIS — Z9181 History of falling: Secondary | ICD-10-CM | POA: Diagnosis not present

## 2015-12-13 DIAGNOSIS — J441 Chronic obstructive pulmonary disease with (acute) exacerbation: Secondary | ICD-10-CM | POA: Diagnosis not present

## 2015-12-13 DIAGNOSIS — J9691 Respiratory failure, unspecified with hypoxia: Secondary | ICD-10-CM | POA: Diagnosis not present

## 2015-12-13 DIAGNOSIS — N186 End stage renal disease: Secondary | ICD-10-CM | POA: Diagnosis not present

## 2015-12-14 ENCOUNTER — Other Ambulatory Visit: Payer: Self-pay | Admitting: General Surgery

## 2015-12-14 DIAGNOSIS — N2581 Secondary hyperparathyroidism of renal origin: Secondary | ICD-10-CM | POA: Diagnosis not present

## 2015-12-14 DIAGNOSIS — Z9981 Dependence on supplemental oxygen: Secondary | ICD-10-CM | POA: Diagnosis not present

## 2015-12-14 DIAGNOSIS — J441 Chronic obstructive pulmonary disease with (acute) exacerbation: Secondary | ICD-10-CM | POA: Diagnosis not present

## 2015-12-14 DIAGNOSIS — I509 Heart failure, unspecified: Secondary | ICD-10-CM | POA: Diagnosis not present

## 2015-12-14 DIAGNOSIS — Z992 Dependence on renal dialysis: Secondary | ICD-10-CM | POA: Diagnosis not present

## 2015-12-14 DIAGNOSIS — J9691 Respiratory failure, unspecified with hypoxia: Secondary | ICD-10-CM | POA: Diagnosis not present

## 2015-12-14 DIAGNOSIS — R531 Weakness: Secondary | ICD-10-CM | POA: Diagnosis not present

## 2015-12-14 DIAGNOSIS — D509 Iron deficiency anemia, unspecified: Secondary | ICD-10-CM | POA: Diagnosis not present

## 2015-12-14 DIAGNOSIS — Z853 Personal history of malignant neoplasm of breast: Secondary | ICD-10-CM

## 2015-12-14 DIAGNOSIS — N186 End stage renal disease: Secondary | ICD-10-CM | POA: Diagnosis not present

## 2015-12-14 DIAGNOSIS — Z9181 History of falling: Secondary | ICD-10-CM | POA: Diagnosis not present

## 2015-12-15 DIAGNOSIS — J441 Chronic obstructive pulmonary disease with (acute) exacerbation: Secondary | ICD-10-CM | POA: Diagnosis not present

## 2015-12-15 DIAGNOSIS — Z9181 History of falling: Secondary | ICD-10-CM | POA: Diagnosis not present

## 2015-12-15 DIAGNOSIS — N2581 Secondary hyperparathyroidism of renal origin: Secondary | ICD-10-CM | POA: Diagnosis not present

## 2015-12-15 DIAGNOSIS — I509 Heart failure, unspecified: Secondary | ICD-10-CM | POA: Diagnosis not present

## 2015-12-15 DIAGNOSIS — J9691 Respiratory failure, unspecified with hypoxia: Secondary | ICD-10-CM | POA: Diagnosis not present

## 2015-12-15 DIAGNOSIS — R531 Weakness: Secondary | ICD-10-CM | POA: Diagnosis not present

## 2015-12-15 DIAGNOSIS — Z9981 Dependence on supplemental oxygen: Secondary | ICD-10-CM | POA: Diagnosis not present

## 2015-12-15 DIAGNOSIS — N186 End stage renal disease: Secondary | ICD-10-CM | POA: Diagnosis not present

## 2015-12-15 DIAGNOSIS — Z992 Dependence on renal dialysis: Secondary | ICD-10-CM | POA: Diagnosis not present

## 2015-12-15 DIAGNOSIS — D509 Iron deficiency anemia, unspecified: Secondary | ICD-10-CM | POA: Diagnosis not present

## 2015-12-16 DIAGNOSIS — N186 End stage renal disease: Secondary | ICD-10-CM | POA: Diagnosis not present

## 2015-12-16 DIAGNOSIS — D509 Iron deficiency anemia, unspecified: Secondary | ICD-10-CM | POA: Diagnosis not present

## 2015-12-16 DIAGNOSIS — N2581 Secondary hyperparathyroidism of renal origin: Secondary | ICD-10-CM | POA: Diagnosis not present

## 2015-12-16 DIAGNOSIS — Z992 Dependence on renal dialysis: Secondary | ICD-10-CM | POA: Diagnosis not present

## 2015-12-17 DIAGNOSIS — N186 End stage renal disease: Secondary | ICD-10-CM | POA: Diagnosis not present

## 2015-12-17 DIAGNOSIS — Z9181 History of falling: Secondary | ICD-10-CM | POA: Diagnosis not present

## 2015-12-17 DIAGNOSIS — Z9981 Dependence on supplemental oxygen: Secondary | ICD-10-CM | POA: Diagnosis not present

## 2015-12-17 DIAGNOSIS — Z992 Dependence on renal dialysis: Secondary | ICD-10-CM | POA: Diagnosis not present

## 2015-12-17 DIAGNOSIS — I509 Heart failure, unspecified: Secondary | ICD-10-CM | POA: Diagnosis not present

## 2015-12-17 DIAGNOSIS — N2581 Secondary hyperparathyroidism of renal origin: Secondary | ICD-10-CM | POA: Diagnosis not present

## 2015-12-17 DIAGNOSIS — J9691 Respiratory failure, unspecified with hypoxia: Secondary | ICD-10-CM | POA: Diagnosis not present

## 2015-12-17 DIAGNOSIS — J441 Chronic obstructive pulmonary disease with (acute) exacerbation: Secondary | ICD-10-CM | POA: Diagnosis not present

## 2015-12-17 DIAGNOSIS — D509 Iron deficiency anemia, unspecified: Secondary | ICD-10-CM | POA: Diagnosis not present

## 2015-12-17 DIAGNOSIS — R531 Weakness: Secondary | ICD-10-CM | POA: Diagnosis not present

## 2015-12-18 DIAGNOSIS — J441 Chronic obstructive pulmonary disease with (acute) exacerbation: Secondary | ICD-10-CM | POA: Diagnosis not present

## 2015-12-18 DIAGNOSIS — D509 Iron deficiency anemia, unspecified: Secondary | ICD-10-CM | POA: Diagnosis not present

## 2015-12-18 DIAGNOSIS — J9691 Respiratory failure, unspecified with hypoxia: Secondary | ICD-10-CM | POA: Diagnosis not present

## 2015-12-18 DIAGNOSIS — N186 End stage renal disease: Secondary | ICD-10-CM | POA: Diagnosis not present

## 2015-12-18 DIAGNOSIS — Z992 Dependence on renal dialysis: Secondary | ICD-10-CM | POA: Diagnosis not present

## 2015-12-18 DIAGNOSIS — Z9181 History of falling: Secondary | ICD-10-CM | POA: Diagnosis not present

## 2015-12-18 DIAGNOSIS — N2581 Secondary hyperparathyroidism of renal origin: Secondary | ICD-10-CM | POA: Diagnosis not present

## 2015-12-18 DIAGNOSIS — I509 Heart failure, unspecified: Secondary | ICD-10-CM | POA: Diagnosis not present

## 2015-12-18 DIAGNOSIS — Z9981 Dependence on supplemental oxygen: Secondary | ICD-10-CM | POA: Diagnosis not present

## 2015-12-18 DIAGNOSIS — R531 Weakness: Secondary | ICD-10-CM | POA: Diagnosis not present

## 2015-12-19 DIAGNOSIS — N186 End stage renal disease: Secondary | ICD-10-CM | POA: Diagnosis not present

## 2015-12-19 DIAGNOSIS — Z992 Dependence on renal dialysis: Secondary | ICD-10-CM | POA: Diagnosis not present

## 2015-12-19 DIAGNOSIS — D509 Iron deficiency anemia, unspecified: Secondary | ICD-10-CM | POA: Diagnosis not present

## 2015-12-19 DIAGNOSIS — N2581 Secondary hyperparathyroidism of renal origin: Secondary | ICD-10-CM | POA: Diagnosis not present

## 2015-12-20 DIAGNOSIS — N186 End stage renal disease: Secondary | ICD-10-CM | POA: Diagnosis not present

## 2015-12-20 DIAGNOSIS — Z9981 Dependence on supplemental oxygen: Secondary | ICD-10-CM | POA: Diagnosis not present

## 2015-12-20 DIAGNOSIS — Z992 Dependence on renal dialysis: Secondary | ICD-10-CM | POA: Diagnosis not present

## 2015-12-20 DIAGNOSIS — J9691 Respiratory failure, unspecified with hypoxia: Secondary | ICD-10-CM | POA: Diagnosis not present

## 2015-12-20 DIAGNOSIS — Z9181 History of falling: Secondary | ICD-10-CM | POA: Diagnosis not present

## 2015-12-20 DIAGNOSIS — D509 Iron deficiency anemia, unspecified: Secondary | ICD-10-CM | POA: Diagnosis not present

## 2015-12-20 DIAGNOSIS — R531 Weakness: Secondary | ICD-10-CM | POA: Diagnosis not present

## 2015-12-20 DIAGNOSIS — J441 Chronic obstructive pulmonary disease with (acute) exacerbation: Secondary | ICD-10-CM | POA: Diagnosis not present

## 2015-12-20 DIAGNOSIS — N2581 Secondary hyperparathyroidism of renal origin: Secondary | ICD-10-CM | POA: Diagnosis not present

## 2015-12-20 DIAGNOSIS — I509 Heart failure, unspecified: Secondary | ICD-10-CM | POA: Diagnosis not present

## 2015-12-21 DIAGNOSIS — Z9181 History of falling: Secondary | ICD-10-CM | POA: Diagnosis not present

## 2015-12-21 DIAGNOSIS — Z9981 Dependence on supplemental oxygen: Secondary | ICD-10-CM | POA: Diagnosis not present

## 2015-12-21 DIAGNOSIS — D509 Iron deficiency anemia, unspecified: Secondary | ICD-10-CM | POA: Diagnosis not present

## 2015-12-21 DIAGNOSIS — J441 Chronic obstructive pulmonary disease with (acute) exacerbation: Secondary | ICD-10-CM | POA: Diagnosis not present

## 2015-12-21 DIAGNOSIS — J9691 Respiratory failure, unspecified with hypoxia: Secondary | ICD-10-CM | POA: Diagnosis not present

## 2015-12-21 DIAGNOSIS — I509 Heart failure, unspecified: Secondary | ICD-10-CM | POA: Diagnosis not present

## 2015-12-21 DIAGNOSIS — N186 End stage renal disease: Secondary | ICD-10-CM | POA: Diagnosis not present

## 2015-12-21 DIAGNOSIS — Z992 Dependence on renal dialysis: Secondary | ICD-10-CM | POA: Diagnosis not present

## 2015-12-21 DIAGNOSIS — N2581 Secondary hyperparathyroidism of renal origin: Secondary | ICD-10-CM | POA: Diagnosis not present

## 2015-12-21 DIAGNOSIS — R531 Weakness: Secondary | ICD-10-CM | POA: Diagnosis not present

## 2015-12-22 DIAGNOSIS — J9691 Respiratory failure, unspecified with hypoxia: Secondary | ICD-10-CM | POA: Diagnosis not present

## 2015-12-22 DIAGNOSIS — D509 Iron deficiency anemia, unspecified: Secondary | ICD-10-CM | POA: Diagnosis not present

## 2015-12-22 DIAGNOSIS — Z992 Dependence on renal dialysis: Secondary | ICD-10-CM | POA: Diagnosis not present

## 2015-12-22 DIAGNOSIS — N186 End stage renal disease: Secondary | ICD-10-CM | POA: Diagnosis not present

## 2015-12-22 DIAGNOSIS — N2581 Secondary hyperparathyroidism of renal origin: Secondary | ICD-10-CM | POA: Diagnosis not present

## 2015-12-22 DIAGNOSIS — I509 Heart failure, unspecified: Secondary | ICD-10-CM | POA: Diagnosis not present

## 2015-12-22 DIAGNOSIS — Z9181 History of falling: Secondary | ICD-10-CM | POA: Diagnosis not present

## 2015-12-22 DIAGNOSIS — R531 Weakness: Secondary | ICD-10-CM | POA: Diagnosis not present

## 2015-12-22 DIAGNOSIS — Z9981 Dependence on supplemental oxygen: Secondary | ICD-10-CM | POA: Diagnosis not present

## 2015-12-22 DIAGNOSIS — J441 Chronic obstructive pulmonary disease with (acute) exacerbation: Secondary | ICD-10-CM | POA: Diagnosis not present

## 2015-12-23 DIAGNOSIS — N186 End stage renal disease: Secondary | ICD-10-CM | POA: Diagnosis not present

## 2015-12-23 DIAGNOSIS — N2581 Secondary hyperparathyroidism of renal origin: Secondary | ICD-10-CM | POA: Diagnosis not present

## 2015-12-23 DIAGNOSIS — D509 Iron deficiency anemia, unspecified: Secondary | ICD-10-CM | POA: Diagnosis not present

## 2015-12-23 DIAGNOSIS — Z992 Dependence on renal dialysis: Secondary | ICD-10-CM | POA: Diagnosis not present

## 2015-12-24 ENCOUNTER — Other Ambulatory Visit: Payer: Self-pay | Admitting: *Deleted

## 2015-12-24 ENCOUNTER — Ambulatory Visit
Admission: RE | Admit: 2015-12-24 | Discharge: 2015-12-24 | Disposition: A | Discharge status: Home / Self Care | Payer: Self-pay | Source: Ambulatory Visit | Attending: *Deleted | Admitting: *Deleted

## 2015-12-24 DIAGNOSIS — J441 Chronic obstructive pulmonary disease with (acute) exacerbation: Secondary | ICD-10-CM | POA: Diagnosis not present

## 2015-12-24 DIAGNOSIS — I509 Heart failure, unspecified: Secondary | ICD-10-CM | POA: Diagnosis not present

## 2015-12-24 DIAGNOSIS — Z9289 Personal history of other medical treatment: Secondary | ICD-10-CM

## 2015-12-24 DIAGNOSIS — Z992 Dependence on renal dialysis: Secondary | ICD-10-CM | POA: Diagnosis not present

## 2015-12-24 DIAGNOSIS — N2581 Secondary hyperparathyroidism of renal origin: Secondary | ICD-10-CM | POA: Diagnosis not present

## 2015-12-24 DIAGNOSIS — D509 Iron deficiency anemia, unspecified: Secondary | ICD-10-CM | POA: Diagnosis not present

## 2015-12-24 DIAGNOSIS — R531 Weakness: Secondary | ICD-10-CM | POA: Diagnosis not present

## 2015-12-24 DIAGNOSIS — Z9981 Dependence on supplemental oxygen: Secondary | ICD-10-CM | POA: Diagnosis not present

## 2015-12-24 DIAGNOSIS — Z9181 History of falling: Secondary | ICD-10-CM | POA: Diagnosis not present

## 2015-12-24 DIAGNOSIS — N186 End stage renal disease: Secondary | ICD-10-CM | POA: Diagnosis not present

## 2015-12-24 DIAGNOSIS — J9691 Respiratory failure, unspecified with hypoxia: Secondary | ICD-10-CM | POA: Diagnosis not present

## 2015-12-25 DIAGNOSIS — D509 Iron deficiency anemia, unspecified: Secondary | ICD-10-CM | POA: Diagnosis not present

## 2015-12-25 DIAGNOSIS — Z992 Dependence on renal dialysis: Secondary | ICD-10-CM | POA: Diagnosis not present

## 2015-12-25 DIAGNOSIS — N2581 Secondary hyperparathyroidism of renal origin: Secondary | ICD-10-CM | POA: Diagnosis not present

## 2015-12-25 DIAGNOSIS — N186 End stage renal disease: Secondary | ICD-10-CM | POA: Diagnosis not present

## 2015-12-26 DIAGNOSIS — Z992 Dependence on renal dialysis: Secondary | ICD-10-CM | POA: Diagnosis not present

## 2015-12-26 DIAGNOSIS — N2581 Secondary hyperparathyroidism of renal origin: Secondary | ICD-10-CM | POA: Diagnosis not present

## 2015-12-26 DIAGNOSIS — D509 Iron deficiency anemia, unspecified: Secondary | ICD-10-CM | POA: Diagnosis not present

## 2015-12-26 DIAGNOSIS — N186 End stage renal disease: Secondary | ICD-10-CM | POA: Diagnosis not present

## 2015-12-27 ENCOUNTER — Encounter: Payer: Self-pay | Admitting: *Deleted

## 2015-12-27 DIAGNOSIS — D509 Iron deficiency anemia, unspecified: Secondary | ICD-10-CM | POA: Diagnosis not present

## 2015-12-27 DIAGNOSIS — Z9181 History of falling: Secondary | ICD-10-CM | POA: Diagnosis not present

## 2015-12-27 DIAGNOSIS — N2581 Secondary hyperparathyroidism of renal origin: Secondary | ICD-10-CM | POA: Diagnosis not present

## 2015-12-27 DIAGNOSIS — Z992 Dependence on renal dialysis: Secondary | ICD-10-CM | POA: Diagnosis not present

## 2015-12-27 DIAGNOSIS — Z9981 Dependence on supplemental oxygen: Secondary | ICD-10-CM | POA: Diagnosis not present

## 2015-12-27 DIAGNOSIS — R531 Weakness: Secondary | ICD-10-CM | POA: Diagnosis not present

## 2015-12-27 DIAGNOSIS — J441 Chronic obstructive pulmonary disease with (acute) exacerbation: Secondary | ICD-10-CM | POA: Diagnosis not present

## 2015-12-27 DIAGNOSIS — I509 Heart failure, unspecified: Secondary | ICD-10-CM | POA: Diagnosis not present

## 2015-12-27 DIAGNOSIS — J9691 Respiratory failure, unspecified with hypoxia: Secondary | ICD-10-CM | POA: Diagnosis not present

## 2015-12-27 DIAGNOSIS — N186 End stage renal disease: Secondary | ICD-10-CM | POA: Diagnosis not present

## 2015-12-28 DIAGNOSIS — N2581 Secondary hyperparathyroidism of renal origin: Secondary | ICD-10-CM | POA: Diagnosis not present

## 2015-12-28 DIAGNOSIS — N186 End stage renal disease: Secondary | ICD-10-CM | POA: Diagnosis not present

## 2015-12-28 DIAGNOSIS — Z992 Dependence on renal dialysis: Secondary | ICD-10-CM | POA: Diagnosis not present

## 2015-12-28 DIAGNOSIS — D509 Iron deficiency anemia, unspecified: Secondary | ICD-10-CM | POA: Diagnosis not present

## 2015-12-29 DIAGNOSIS — Z9181 History of falling: Secondary | ICD-10-CM | POA: Diagnosis not present

## 2015-12-29 DIAGNOSIS — N2581 Secondary hyperparathyroidism of renal origin: Secondary | ICD-10-CM | POA: Diagnosis not present

## 2015-12-29 DIAGNOSIS — Z992 Dependence on renal dialysis: Secondary | ICD-10-CM | POA: Diagnosis not present

## 2015-12-29 DIAGNOSIS — Z9981 Dependence on supplemental oxygen: Secondary | ICD-10-CM | POA: Diagnosis not present

## 2015-12-29 DIAGNOSIS — I509 Heart failure, unspecified: Secondary | ICD-10-CM | POA: Diagnosis not present

## 2015-12-29 DIAGNOSIS — J9691 Respiratory failure, unspecified with hypoxia: Secondary | ICD-10-CM | POA: Diagnosis not present

## 2015-12-29 DIAGNOSIS — D509 Iron deficiency anemia, unspecified: Secondary | ICD-10-CM | POA: Diagnosis not present

## 2015-12-29 DIAGNOSIS — R531 Weakness: Secondary | ICD-10-CM | POA: Diagnosis not present

## 2015-12-29 DIAGNOSIS — J441 Chronic obstructive pulmonary disease with (acute) exacerbation: Secondary | ICD-10-CM | POA: Diagnosis not present

## 2015-12-29 DIAGNOSIS — N186 End stage renal disease: Secondary | ICD-10-CM | POA: Diagnosis not present

## 2015-12-30 DIAGNOSIS — J441 Chronic obstructive pulmonary disease with (acute) exacerbation: Secondary | ICD-10-CM | POA: Diagnosis not present

## 2015-12-30 DIAGNOSIS — I509 Heart failure, unspecified: Secondary | ICD-10-CM | POA: Diagnosis not present

## 2015-12-30 DIAGNOSIS — Z992 Dependence on renal dialysis: Secondary | ICD-10-CM | POA: Diagnosis not present

## 2015-12-30 DIAGNOSIS — Z9181 History of falling: Secondary | ICD-10-CM | POA: Diagnosis not present

## 2015-12-30 DIAGNOSIS — R531 Weakness: Secondary | ICD-10-CM | POA: Diagnosis not present

## 2015-12-30 DIAGNOSIS — D509 Iron deficiency anemia, unspecified: Secondary | ICD-10-CM | POA: Diagnosis not present

## 2015-12-30 DIAGNOSIS — J9691 Respiratory failure, unspecified with hypoxia: Secondary | ICD-10-CM | POA: Diagnosis not present

## 2015-12-30 DIAGNOSIS — N2581 Secondary hyperparathyroidism of renal origin: Secondary | ICD-10-CM | POA: Diagnosis not present

## 2015-12-30 DIAGNOSIS — Z9981 Dependence on supplemental oxygen: Secondary | ICD-10-CM | POA: Diagnosis not present

## 2015-12-30 DIAGNOSIS — N186 End stage renal disease: Secondary | ICD-10-CM | POA: Diagnosis not present

## 2015-12-31 DIAGNOSIS — R531 Weakness: Secondary | ICD-10-CM | POA: Diagnosis not present

## 2015-12-31 DIAGNOSIS — Z9181 History of falling: Secondary | ICD-10-CM | POA: Diagnosis not present

## 2015-12-31 DIAGNOSIS — J9691 Respiratory failure, unspecified with hypoxia: Secondary | ICD-10-CM | POA: Diagnosis not present

## 2015-12-31 DIAGNOSIS — Z992 Dependence on renal dialysis: Secondary | ICD-10-CM | POA: Diagnosis not present

## 2015-12-31 DIAGNOSIS — J441 Chronic obstructive pulmonary disease with (acute) exacerbation: Secondary | ICD-10-CM | POA: Diagnosis not present

## 2015-12-31 DIAGNOSIS — Z9981 Dependence on supplemental oxygen: Secondary | ICD-10-CM | POA: Diagnosis not present

## 2015-12-31 DIAGNOSIS — D509 Iron deficiency anemia, unspecified: Secondary | ICD-10-CM | POA: Diagnosis not present

## 2015-12-31 DIAGNOSIS — I509 Heart failure, unspecified: Secondary | ICD-10-CM | POA: Diagnosis not present

## 2015-12-31 DIAGNOSIS — N2581 Secondary hyperparathyroidism of renal origin: Secondary | ICD-10-CM | POA: Diagnosis not present

## 2015-12-31 DIAGNOSIS — N186 End stage renal disease: Secondary | ICD-10-CM | POA: Diagnosis not present

## 2016-01-01 DIAGNOSIS — N2581 Secondary hyperparathyroidism of renal origin: Secondary | ICD-10-CM | POA: Diagnosis not present

## 2016-01-01 DIAGNOSIS — N186 End stage renal disease: Secondary | ICD-10-CM | POA: Diagnosis not present

## 2016-01-01 DIAGNOSIS — D509 Iron deficiency anemia, unspecified: Secondary | ICD-10-CM | POA: Diagnosis not present

## 2016-01-01 DIAGNOSIS — Z992 Dependence on renal dialysis: Secondary | ICD-10-CM | POA: Diagnosis not present

## 2016-01-02 DIAGNOSIS — D509 Iron deficiency anemia, unspecified: Secondary | ICD-10-CM | POA: Diagnosis not present

## 2016-01-02 DIAGNOSIS — Z992 Dependence on renal dialysis: Secondary | ICD-10-CM | POA: Diagnosis not present

## 2016-01-02 DIAGNOSIS — N2581 Secondary hyperparathyroidism of renal origin: Secondary | ICD-10-CM | POA: Diagnosis not present

## 2016-01-02 DIAGNOSIS — N186 End stage renal disease: Secondary | ICD-10-CM | POA: Diagnosis not present

## 2016-01-03 DIAGNOSIS — J9691 Respiratory failure, unspecified with hypoxia: Secondary | ICD-10-CM | POA: Diagnosis not present

## 2016-01-03 DIAGNOSIS — Z992 Dependence on renal dialysis: Secondary | ICD-10-CM | POA: Diagnosis not present

## 2016-01-03 DIAGNOSIS — R531 Weakness: Secondary | ICD-10-CM | POA: Diagnosis not present

## 2016-01-03 DIAGNOSIS — N186 End stage renal disease: Secondary | ICD-10-CM | POA: Diagnosis not present

## 2016-01-03 DIAGNOSIS — I509 Heart failure, unspecified: Secondary | ICD-10-CM | POA: Diagnosis not present

## 2016-01-03 DIAGNOSIS — Z9981 Dependence on supplemental oxygen: Secondary | ICD-10-CM | POA: Diagnosis not present

## 2016-01-03 DIAGNOSIS — N2581 Secondary hyperparathyroidism of renal origin: Secondary | ICD-10-CM | POA: Diagnosis not present

## 2016-01-03 DIAGNOSIS — D509 Iron deficiency anemia, unspecified: Secondary | ICD-10-CM | POA: Diagnosis not present

## 2016-01-03 DIAGNOSIS — Z9181 History of falling: Secondary | ICD-10-CM | POA: Diagnosis not present

## 2016-01-03 DIAGNOSIS — J441 Chronic obstructive pulmonary disease with (acute) exacerbation: Secondary | ICD-10-CM | POA: Diagnosis not present

## 2016-01-04 DIAGNOSIS — Z992 Dependence on renal dialysis: Secondary | ICD-10-CM | POA: Diagnosis not present

## 2016-01-04 DIAGNOSIS — N186 End stage renal disease: Secondary | ICD-10-CM | POA: Diagnosis not present

## 2016-01-04 DIAGNOSIS — D509 Iron deficiency anemia, unspecified: Secondary | ICD-10-CM | POA: Diagnosis not present

## 2016-01-04 DIAGNOSIS — N2581 Secondary hyperparathyroidism of renal origin: Secondary | ICD-10-CM | POA: Diagnosis not present

## 2016-01-05 DIAGNOSIS — Z9981 Dependence on supplemental oxygen: Secondary | ICD-10-CM | POA: Diagnosis not present

## 2016-01-05 DIAGNOSIS — Z298 Encounter for other specified prophylactic measures: Secondary | ICD-10-CM | POA: Diagnosis not present

## 2016-01-05 DIAGNOSIS — D509 Iron deficiency anemia, unspecified: Secondary | ICD-10-CM | POA: Diagnosis not present

## 2016-01-05 DIAGNOSIS — J441 Chronic obstructive pulmonary disease with (acute) exacerbation: Secondary | ICD-10-CM | POA: Diagnosis not present

## 2016-01-05 DIAGNOSIS — Z812 Family history of tobacco abuse and dependence: Secondary | ICD-10-CM | POA: Diagnosis not present

## 2016-01-05 DIAGNOSIS — N186 End stage renal disease: Secondary | ICD-10-CM | POA: Diagnosis not present

## 2016-01-05 DIAGNOSIS — I509 Heart failure, unspecified: Secondary | ICD-10-CM | POA: Diagnosis not present

## 2016-01-05 DIAGNOSIS — Z9181 History of falling: Secondary | ICD-10-CM | POA: Diagnosis not present

## 2016-01-05 DIAGNOSIS — Z923 Personal history of irradiation: Secondary | ICD-10-CM | POA: Diagnosis not present

## 2016-01-05 DIAGNOSIS — Z853 Personal history of malignant neoplasm of breast: Secondary | ICD-10-CM | POA: Diagnosis not present

## 2016-01-05 DIAGNOSIS — R531 Weakness: Secondary | ICD-10-CM | POA: Diagnosis not present

## 2016-01-05 DIAGNOSIS — R627 Adult failure to thrive: Secondary | ICD-10-CM | POA: Diagnosis not present

## 2016-01-05 DIAGNOSIS — J9691 Respiratory failure, unspecified with hypoxia: Secondary | ICD-10-CM | POA: Diagnosis not present

## 2016-01-05 DIAGNOSIS — Z992 Dependence on renal dialysis: Secondary | ICD-10-CM | POA: Diagnosis not present

## 2016-01-06 DIAGNOSIS — Z9181 History of falling: Secondary | ICD-10-CM | POA: Diagnosis not present

## 2016-01-06 DIAGNOSIS — N186 End stage renal disease: Secondary | ICD-10-CM | POA: Diagnosis not present

## 2016-01-06 DIAGNOSIS — J9691 Respiratory failure, unspecified with hypoxia: Secondary | ICD-10-CM | POA: Diagnosis not present

## 2016-01-06 DIAGNOSIS — D509 Iron deficiency anemia, unspecified: Secondary | ICD-10-CM | POA: Diagnosis not present

## 2016-01-06 DIAGNOSIS — J441 Chronic obstructive pulmonary disease with (acute) exacerbation: Secondary | ICD-10-CM | POA: Diagnosis not present

## 2016-01-06 DIAGNOSIS — Z298 Encounter for other specified prophylactic measures: Secondary | ICD-10-CM | POA: Diagnosis not present

## 2016-01-06 DIAGNOSIS — I509 Heart failure, unspecified: Secondary | ICD-10-CM | POA: Diagnosis not present

## 2016-01-06 DIAGNOSIS — Z992 Dependence on renal dialysis: Secondary | ICD-10-CM | POA: Diagnosis not present

## 2016-01-06 DIAGNOSIS — R531 Weakness: Secondary | ICD-10-CM | POA: Diagnosis not present

## 2016-01-06 DIAGNOSIS — Z9981 Dependence on supplemental oxygen: Secondary | ICD-10-CM | POA: Diagnosis not present

## 2016-01-07 DIAGNOSIS — I509 Heart failure, unspecified: Secondary | ICD-10-CM | POA: Diagnosis not present

## 2016-01-07 DIAGNOSIS — Z9981 Dependence on supplemental oxygen: Secondary | ICD-10-CM | POA: Diagnosis not present

## 2016-01-07 DIAGNOSIS — N186 End stage renal disease: Secondary | ICD-10-CM | POA: Diagnosis not present

## 2016-01-07 DIAGNOSIS — J9691 Respiratory failure, unspecified with hypoxia: Secondary | ICD-10-CM | POA: Diagnosis not present

## 2016-01-07 DIAGNOSIS — Z9181 History of falling: Secondary | ICD-10-CM | POA: Diagnosis not present

## 2016-01-07 DIAGNOSIS — R531 Weakness: Secondary | ICD-10-CM | POA: Diagnosis not present

## 2016-01-07 DIAGNOSIS — J441 Chronic obstructive pulmonary disease with (acute) exacerbation: Secondary | ICD-10-CM | POA: Diagnosis not present

## 2016-01-07 DIAGNOSIS — D509 Iron deficiency anemia, unspecified: Secondary | ICD-10-CM | POA: Diagnosis not present

## 2016-01-07 DIAGNOSIS — Z298 Encounter for other specified prophylactic measures: Secondary | ICD-10-CM | POA: Diagnosis not present

## 2016-01-07 DIAGNOSIS — Z992 Dependence on renal dialysis: Secondary | ICD-10-CM | POA: Diagnosis not present

## 2016-01-08 DIAGNOSIS — Z992 Dependence on renal dialysis: Secondary | ICD-10-CM | POA: Diagnosis not present

## 2016-01-08 DIAGNOSIS — D509 Iron deficiency anemia, unspecified: Secondary | ICD-10-CM | POA: Diagnosis not present

## 2016-01-08 DIAGNOSIS — Z298 Encounter for other specified prophylactic measures: Secondary | ICD-10-CM | POA: Diagnosis not present

## 2016-01-08 DIAGNOSIS — N186 End stage renal disease: Secondary | ICD-10-CM | POA: Diagnosis not present

## 2016-01-09 DIAGNOSIS — D509 Iron deficiency anemia, unspecified: Secondary | ICD-10-CM | POA: Diagnosis not present

## 2016-01-09 DIAGNOSIS — N186 End stage renal disease: Secondary | ICD-10-CM | POA: Diagnosis not present

## 2016-01-09 DIAGNOSIS — Z298 Encounter for other specified prophylactic measures: Secondary | ICD-10-CM | POA: Diagnosis not present

## 2016-01-09 DIAGNOSIS — Z992 Dependence on renal dialysis: Secondary | ICD-10-CM | POA: Diagnosis not present

## 2016-01-10 DIAGNOSIS — Z298 Encounter for other specified prophylactic measures: Secondary | ICD-10-CM | POA: Diagnosis not present

## 2016-01-10 DIAGNOSIS — Z9181 History of falling: Secondary | ICD-10-CM | POA: Diagnosis not present

## 2016-01-10 DIAGNOSIS — J441 Chronic obstructive pulmonary disease with (acute) exacerbation: Secondary | ICD-10-CM | POA: Diagnosis not present

## 2016-01-10 DIAGNOSIS — I509 Heart failure, unspecified: Secondary | ICD-10-CM | POA: Diagnosis not present

## 2016-01-10 DIAGNOSIS — Z992 Dependence on renal dialysis: Secondary | ICD-10-CM | POA: Diagnosis not present

## 2016-01-10 DIAGNOSIS — D509 Iron deficiency anemia, unspecified: Secondary | ICD-10-CM | POA: Diagnosis not present

## 2016-01-10 DIAGNOSIS — J9691 Respiratory failure, unspecified with hypoxia: Secondary | ICD-10-CM | POA: Diagnosis not present

## 2016-01-10 DIAGNOSIS — Z9981 Dependence on supplemental oxygen: Secondary | ICD-10-CM | POA: Diagnosis not present

## 2016-01-10 DIAGNOSIS — R531 Weakness: Secondary | ICD-10-CM | POA: Diagnosis not present

## 2016-01-10 DIAGNOSIS — N186 End stage renal disease: Secondary | ICD-10-CM | POA: Diagnosis not present

## 2016-01-11 DIAGNOSIS — I509 Heart failure, unspecified: Secondary | ICD-10-CM | POA: Diagnosis not present

## 2016-01-11 DIAGNOSIS — Z992 Dependence on renal dialysis: Secondary | ICD-10-CM | POA: Diagnosis not present

## 2016-01-11 DIAGNOSIS — Z9981 Dependence on supplemental oxygen: Secondary | ICD-10-CM | POA: Diagnosis not present

## 2016-01-11 DIAGNOSIS — Z298 Encounter for other specified prophylactic measures: Secondary | ICD-10-CM | POA: Diagnosis not present

## 2016-01-11 DIAGNOSIS — J9691 Respiratory failure, unspecified with hypoxia: Secondary | ICD-10-CM | POA: Diagnosis not present

## 2016-01-11 DIAGNOSIS — N186 End stage renal disease: Secondary | ICD-10-CM | POA: Diagnosis not present

## 2016-01-11 DIAGNOSIS — D509 Iron deficiency anemia, unspecified: Secondary | ICD-10-CM | POA: Diagnosis not present

## 2016-01-11 DIAGNOSIS — J441 Chronic obstructive pulmonary disease with (acute) exacerbation: Secondary | ICD-10-CM | POA: Diagnosis not present

## 2016-01-11 DIAGNOSIS — R531 Weakness: Secondary | ICD-10-CM | POA: Diagnosis not present

## 2016-01-11 DIAGNOSIS — Z9181 History of falling: Secondary | ICD-10-CM | POA: Diagnosis not present

## 2016-01-12 DIAGNOSIS — R531 Weakness: Secondary | ICD-10-CM | POA: Diagnosis not present

## 2016-01-12 DIAGNOSIS — D509 Iron deficiency anemia, unspecified: Secondary | ICD-10-CM | POA: Diagnosis not present

## 2016-01-12 DIAGNOSIS — J441 Chronic obstructive pulmonary disease with (acute) exacerbation: Secondary | ICD-10-CM | POA: Diagnosis not present

## 2016-01-12 DIAGNOSIS — Z9181 History of falling: Secondary | ICD-10-CM | POA: Diagnosis not present

## 2016-01-12 DIAGNOSIS — I509 Heart failure, unspecified: Secondary | ICD-10-CM | POA: Diagnosis not present

## 2016-01-12 DIAGNOSIS — J9691 Respiratory failure, unspecified with hypoxia: Secondary | ICD-10-CM | POA: Diagnosis not present

## 2016-01-12 DIAGNOSIS — N186 End stage renal disease: Secondary | ICD-10-CM | POA: Diagnosis not present

## 2016-01-12 DIAGNOSIS — Z9981 Dependence on supplemental oxygen: Secondary | ICD-10-CM | POA: Diagnosis not present

## 2016-01-12 DIAGNOSIS — Z298 Encounter for other specified prophylactic measures: Secondary | ICD-10-CM | POA: Diagnosis not present

## 2016-01-12 DIAGNOSIS — Z992 Dependence on renal dialysis: Secondary | ICD-10-CM | POA: Diagnosis not present

## 2016-01-13 DIAGNOSIS — Z298 Encounter for other specified prophylactic measures: Secondary | ICD-10-CM | POA: Diagnosis not present

## 2016-01-13 DIAGNOSIS — I509 Heart failure, unspecified: Secondary | ICD-10-CM | POA: Diagnosis not present

## 2016-01-13 DIAGNOSIS — Z9181 History of falling: Secondary | ICD-10-CM | POA: Diagnosis not present

## 2016-01-13 DIAGNOSIS — Z9981 Dependence on supplemental oxygen: Secondary | ICD-10-CM | POA: Diagnosis not present

## 2016-01-13 DIAGNOSIS — N186 End stage renal disease: Secondary | ICD-10-CM | POA: Diagnosis not present

## 2016-01-13 DIAGNOSIS — D509 Iron deficiency anemia, unspecified: Secondary | ICD-10-CM | POA: Diagnosis not present

## 2016-01-13 DIAGNOSIS — R531 Weakness: Secondary | ICD-10-CM | POA: Diagnosis not present

## 2016-01-13 DIAGNOSIS — Z992 Dependence on renal dialysis: Secondary | ICD-10-CM | POA: Diagnosis not present

## 2016-01-13 DIAGNOSIS — J441 Chronic obstructive pulmonary disease with (acute) exacerbation: Secondary | ICD-10-CM | POA: Diagnosis not present

## 2016-01-13 DIAGNOSIS — J9691 Respiratory failure, unspecified with hypoxia: Secondary | ICD-10-CM | POA: Diagnosis not present

## 2016-01-14 DIAGNOSIS — J441 Chronic obstructive pulmonary disease with (acute) exacerbation: Secondary | ICD-10-CM | POA: Diagnosis not present

## 2016-01-14 DIAGNOSIS — I509 Heart failure, unspecified: Secondary | ICD-10-CM | POA: Diagnosis not present

## 2016-01-14 DIAGNOSIS — Z992 Dependence on renal dialysis: Secondary | ICD-10-CM | POA: Diagnosis not present

## 2016-01-14 DIAGNOSIS — Z9981 Dependence on supplemental oxygen: Secondary | ICD-10-CM | POA: Diagnosis not present

## 2016-01-14 DIAGNOSIS — D509 Iron deficiency anemia, unspecified: Secondary | ICD-10-CM | POA: Diagnosis not present

## 2016-01-14 DIAGNOSIS — Z9181 History of falling: Secondary | ICD-10-CM | POA: Diagnosis not present

## 2016-01-14 DIAGNOSIS — R531 Weakness: Secondary | ICD-10-CM | POA: Diagnosis not present

## 2016-01-14 DIAGNOSIS — N186 End stage renal disease: Secondary | ICD-10-CM | POA: Diagnosis not present

## 2016-01-14 DIAGNOSIS — Z298 Encounter for other specified prophylactic measures: Secondary | ICD-10-CM | POA: Diagnosis not present

## 2016-01-14 DIAGNOSIS — J9691 Respiratory failure, unspecified with hypoxia: Secondary | ICD-10-CM | POA: Diagnosis not present

## 2016-01-15 DIAGNOSIS — I5022 Chronic systolic (congestive) heart failure: Secondary | ICD-10-CM | POA: Diagnosis not present

## 2016-01-15 DIAGNOSIS — N189 Chronic kidney disease, unspecified: Secondary | ICD-10-CM | POA: Diagnosis not present

## 2016-01-15 DIAGNOSIS — Z992 Dependence on renal dialysis: Secondary | ICD-10-CM | POA: Diagnosis not present

## 2016-01-15 DIAGNOSIS — R4701 Aphasia: Secondary | ICD-10-CM | POA: Diagnosis not present

## 2016-01-15 DIAGNOSIS — J449 Chronic obstructive pulmonary disease, unspecified: Secondary | ICD-10-CM | POA: Diagnosis not present

## 2016-01-15 DIAGNOSIS — F339 Major depressive disorder, recurrent, unspecified: Secondary | ICD-10-CM | POA: Diagnosis not present

## 2016-01-15 DIAGNOSIS — I639 Cerebral infarction, unspecified: Secondary | ICD-10-CM | POA: Diagnosis not present

## 2016-01-15 DIAGNOSIS — I63511 Cerebral infarction due to unspecified occlusion or stenosis of right middle cerebral artery: Secondary | ICD-10-CM | POA: Diagnosis not present

## 2016-01-15 DIAGNOSIS — M2602 Maxillary hypoplasia: Secondary | ICD-10-CM | POA: Diagnosis not present

## 2016-01-15 DIAGNOSIS — R4781 Slurred speech: Secondary | ICD-10-CM | POA: Diagnosis not present

## 2016-01-15 DIAGNOSIS — G9389 Other specified disorders of brain: Secondary | ICD-10-CM | POA: Diagnosis not present

## 2016-01-15 DIAGNOSIS — I452 Bifascicular block: Secondary | ICD-10-CM | POA: Diagnosis not present

## 2016-01-15 DIAGNOSIS — N186 End stage renal disease: Secondary | ICD-10-CM | POA: Diagnosis not present

## 2016-01-15 DIAGNOSIS — R9431 Abnormal electrocardiogram [ECG] [EKG]: Secondary | ICD-10-CM | POA: Diagnosis not present

## 2016-01-15 DIAGNOSIS — Z8673 Personal history of transient ischemic attack (TIA), and cerebral infarction without residual deficits: Secondary | ICD-10-CM | POA: Diagnosis not present

## 2016-01-15 DIAGNOSIS — R4182 Altered mental status, unspecified: Secondary | ICD-10-CM | POA: Diagnosis not present

## 2016-01-15 DIAGNOSIS — I491 Atrial premature depolarization: Secondary | ICD-10-CM | POA: Diagnosis not present

## 2016-01-15 DIAGNOSIS — I252 Old myocardial infarction: Secondary | ICD-10-CM | POA: Diagnosis not present

## 2016-01-16 DIAGNOSIS — R531 Weakness: Secondary | ICD-10-CM | POA: Diagnosis not present

## 2016-01-16 DIAGNOSIS — N186 End stage renal disease: Secondary | ICD-10-CM | POA: Diagnosis not present

## 2016-01-16 DIAGNOSIS — N189 Chronic kidney disease, unspecified: Secondary | ICD-10-CM | POA: Diagnosis not present

## 2016-01-16 DIAGNOSIS — Z9181 History of falling: Secondary | ICD-10-CM | POA: Diagnosis not present

## 2016-01-16 DIAGNOSIS — R4182 Altered mental status, unspecified: Secondary | ICD-10-CM | POA: Diagnosis not present

## 2016-01-16 DIAGNOSIS — G9340 Encephalopathy, unspecified: Secondary | ICD-10-CM | POA: Diagnosis not present

## 2016-01-16 DIAGNOSIS — Z9981 Dependence on supplemental oxygen: Secondary | ICD-10-CM | POA: Diagnosis not present

## 2016-01-16 DIAGNOSIS — I509 Heart failure, unspecified: Secondary | ICD-10-CM | POA: Diagnosis not present

## 2016-01-16 DIAGNOSIS — I5022 Chronic systolic (congestive) heart failure: Secondary | ICD-10-CM | POA: Diagnosis not present

## 2016-01-16 DIAGNOSIS — Z992 Dependence on renal dialysis: Secondary | ICD-10-CM | POA: Diagnosis not present

## 2016-01-16 DIAGNOSIS — R569 Unspecified convulsions: Secondary | ICD-10-CM | POA: Diagnosis not present

## 2016-01-16 DIAGNOSIS — I639 Cerebral infarction, unspecified: Secondary | ICD-10-CM | POA: Diagnosis not present

## 2016-01-16 DIAGNOSIS — J9691 Respiratory failure, unspecified with hypoxia: Secondary | ICD-10-CM | POA: Diagnosis not present

## 2016-01-16 DIAGNOSIS — J441 Chronic obstructive pulmonary disease with (acute) exacerbation: Secondary | ICD-10-CM | POA: Diagnosis not present

## 2016-01-17 DIAGNOSIS — I517 Cardiomegaly: Secondary | ICD-10-CM | POA: Diagnosis not present

## 2016-01-17 DIAGNOSIS — M6281 Muscle weakness (generalized): Secondary | ICD-10-CM | POA: Diagnosis not present

## 2016-01-17 DIAGNOSIS — D509 Iron deficiency anemia, unspecified: Secondary | ICD-10-CM | POA: Diagnosis not present

## 2016-01-17 DIAGNOSIS — Z298 Encounter for other specified prophylactic measures: Secondary | ICD-10-CM | POA: Diagnosis not present

## 2016-01-17 DIAGNOSIS — R279 Unspecified lack of coordination: Secondary | ICD-10-CM | POA: Diagnosis not present

## 2016-01-17 DIAGNOSIS — Z992 Dependence on renal dialysis: Secondary | ICD-10-CM | POA: Diagnosis not present

## 2016-01-17 DIAGNOSIS — R5381 Other malaise: Secondary | ICD-10-CM | POA: Diagnosis not present

## 2016-01-17 DIAGNOSIS — N186 End stage renal disease: Secondary | ICD-10-CM | POA: Diagnosis not present

## 2016-01-17 DIAGNOSIS — I700 Atherosclerosis of aorta: Secondary | ICD-10-CM | POA: Diagnosis not present

## 2016-01-17 DIAGNOSIS — I639 Cerebral infarction, unspecified: Secondary | ICD-10-CM | POA: Diagnosis not present

## 2016-01-18 DIAGNOSIS — Z9181 History of falling: Secondary | ICD-10-CM | POA: Diagnosis not present

## 2016-01-18 DIAGNOSIS — Z9981 Dependence on supplemental oxygen: Secondary | ICD-10-CM | POA: Diagnosis not present

## 2016-01-18 DIAGNOSIS — J441 Chronic obstructive pulmonary disease with (acute) exacerbation: Secondary | ICD-10-CM | POA: Diagnosis not present

## 2016-01-18 DIAGNOSIS — J9691 Respiratory failure, unspecified with hypoxia: Secondary | ICD-10-CM | POA: Diagnosis not present

## 2016-01-18 DIAGNOSIS — Z298 Encounter for other specified prophylactic measures: Secondary | ICD-10-CM | POA: Diagnosis not present

## 2016-01-18 DIAGNOSIS — I509 Heart failure, unspecified: Secondary | ICD-10-CM | POA: Diagnosis not present

## 2016-01-18 DIAGNOSIS — R531 Weakness: Secondary | ICD-10-CM | POA: Diagnosis not present

## 2016-01-18 DIAGNOSIS — Z992 Dependence on renal dialysis: Secondary | ICD-10-CM | POA: Diagnosis not present

## 2016-01-18 DIAGNOSIS — D509 Iron deficiency anemia, unspecified: Secondary | ICD-10-CM | POA: Diagnosis not present

## 2016-01-18 DIAGNOSIS — N186 End stage renal disease: Secondary | ICD-10-CM | POA: Diagnosis not present

## 2016-01-19 ENCOUNTER — Ambulatory Visit: Payer: TRICARE For Life (TFL)

## 2016-01-19 ENCOUNTER — Other Ambulatory Visit: Payer: TRICARE For Life (TFL)

## 2016-01-19 DIAGNOSIS — J9691 Respiratory failure, unspecified with hypoxia: Secondary | ICD-10-CM | POA: Diagnosis not present

## 2016-01-19 DIAGNOSIS — Z992 Dependence on renal dialysis: Secondary | ICD-10-CM | POA: Diagnosis not present

## 2016-01-19 DIAGNOSIS — Z298 Encounter for other specified prophylactic measures: Secondary | ICD-10-CM | POA: Diagnosis not present

## 2016-01-19 DIAGNOSIS — J441 Chronic obstructive pulmonary disease with (acute) exacerbation: Secondary | ICD-10-CM | POA: Diagnosis not present

## 2016-01-19 DIAGNOSIS — N186 End stage renal disease: Secondary | ICD-10-CM | POA: Diagnosis not present

## 2016-01-19 DIAGNOSIS — I509 Heart failure, unspecified: Secondary | ICD-10-CM | POA: Diagnosis not present

## 2016-01-19 DIAGNOSIS — D509 Iron deficiency anemia, unspecified: Secondary | ICD-10-CM | POA: Diagnosis not present

## 2016-01-19 DIAGNOSIS — Z9181 History of falling: Secondary | ICD-10-CM | POA: Diagnosis not present

## 2016-01-19 DIAGNOSIS — R531 Weakness: Secondary | ICD-10-CM | POA: Diagnosis not present

## 2016-01-19 DIAGNOSIS — Z9981 Dependence on supplemental oxygen: Secondary | ICD-10-CM | POA: Diagnosis not present

## 2016-01-20 DIAGNOSIS — Z298 Encounter for other specified prophylactic measures: Secondary | ICD-10-CM | POA: Diagnosis not present

## 2016-01-20 DIAGNOSIS — Z992 Dependence on renal dialysis: Secondary | ICD-10-CM | POA: Diagnosis not present

## 2016-01-20 DIAGNOSIS — N186 End stage renal disease: Secondary | ICD-10-CM | POA: Diagnosis not present

## 2016-01-20 DIAGNOSIS — D509 Iron deficiency anemia, unspecified: Secondary | ICD-10-CM | POA: Diagnosis not present

## 2016-01-21 DIAGNOSIS — N186 End stage renal disease: Secondary | ICD-10-CM | POA: Diagnosis not present

## 2016-01-21 DIAGNOSIS — J9691 Respiratory failure, unspecified with hypoxia: Secondary | ICD-10-CM | POA: Diagnosis not present

## 2016-01-21 DIAGNOSIS — Z298 Encounter for other specified prophylactic measures: Secondary | ICD-10-CM | POA: Diagnosis not present

## 2016-01-21 DIAGNOSIS — Z9181 History of falling: Secondary | ICD-10-CM | POA: Diagnosis not present

## 2016-01-21 DIAGNOSIS — D509 Iron deficiency anemia, unspecified: Secondary | ICD-10-CM | POA: Diagnosis not present

## 2016-01-21 DIAGNOSIS — Z992 Dependence on renal dialysis: Secondary | ICD-10-CM | POA: Diagnosis not present

## 2016-01-21 DIAGNOSIS — Z9981 Dependence on supplemental oxygen: Secondary | ICD-10-CM | POA: Diagnosis not present

## 2016-01-21 DIAGNOSIS — I509 Heart failure, unspecified: Secondary | ICD-10-CM | POA: Diagnosis not present

## 2016-01-21 DIAGNOSIS — J441 Chronic obstructive pulmonary disease with (acute) exacerbation: Secondary | ICD-10-CM | POA: Diagnosis not present

## 2016-01-21 DIAGNOSIS — R531 Weakness: Secondary | ICD-10-CM | POA: Diagnosis not present

## 2016-01-22 DIAGNOSIS — Z298 Encounter for other specified prophylactic measures: Secondary | ICD-10-CM | POA: Diagnosis not present

## 2016-01-22 DIAGNOSIS — D509 Iron deficiency anemia, unspecified: Secondary | ICD-10-CM | POA: Diagnosis not present

## 2016-01-22 DIAGNOSIS — Z992 Dependence on renal dialysis: Secondary | ICD-10-CM | POA: Diagnosis not present

## 2016-01-22 DIAGNOSIS — N186 End stage renal disease: Secondary | ICD-10-CM | POA: Diagnosis not present

## 2016-01-23 DIAGNOSIS — D509 Iron deficiency anemia, unspecified: Secondary | ICD-10-CM | POA: Diagnosis not present

## 2016-01-23 DIAGNOSIS — Z298 Encounter for other specified prophylactic measures: Secondary | ICD-10-CM | POA: Diagnosis not present

## 2016-01-23 DIAGNOSIS — N186 End stage renal disease: Secondary | ICD-10-CM | POA: Diagnosis not present

## 2016-01-23 DIAGNOSIS — Z992 Dependence on renal dialysis: Secondary | ICD-10-CM | POA: Diagnosis not present

## 2016-01-24 DIAGNOSIS — I509 Heart failure, unspecified: Secondary | ICD-10-CM | POA: Diagnosis not present

## 2016-01-24 DIAGNOSIS — D509 Iron deficiency anemia, unspecified: Secondary | ICD-10-CM | POA: Diagnosis not present

## 2016-01-24 DIAGNOSIS — Z9981 Dependence on supplemental oxygen: Secondary | ICD-10-CM | POA: Diagnosis not present

## 2016-01-24 DIAGNOSIS — Z298 Encounter for other specified prophylactic measures: Secondary | ICD-10-CM | POA: Diagnosis not present

## 2016-01-24 DIAGNOSIS — R531 Weakness: Secondary | ICD-10-CM | POA: Diagnosis not present

## 2016-01-24 DIAGNOSIS — Z9181 History of falling: Secondary | ICD-10-CM | POA: Diagnosis not present

## 2016-01-24 DIAGNOSIS — J441 Chronic obstructive pulmonary disease with (acute) exacerbation: Secondary | ICD-10-CM | POA: Diagnosis not present

## 2016-01-24 DIAGNOSIS — Z992 Dependence on renal dialysis: Secondary | ICD-10-CM | POA: Diagnosis not present

## 2016-01-24 DIAGNOSIS — N186 End stage renal disease: Secondary | ICD-10-CM | POA: Diagnosis not present

## 2016-01-24 DIAGNOSIS — J9691 Respiratory failure, unspecified with hypoxia: Secondary | ICD-10-CM | POA: Diagnosis not present

## 2016-01-25 DIAGNOSIS — Z992 Dependence on renal dialysis: Secondary | ICD-10-CM | POA: Diagnosis not present

## 2016-01-25 DIAGNOSIS — N186 End stage renal disease: Secondary | ICD-10-CM | POA: Diagnosis not present

## 2016-01-25 DIAGNOSIS — Z298 Encounter for other specified prophylactic measures: Secondary | ICD-10-CM | POA: Diagnosis not present

## 2016-01-25 DIAGNOSIS — D509 Iron deficiency anemia, unspecified: Secondary | ICD-10-CM | POA: Diagnosis not present

## 2016-01-26 ENCOUNTER — Ambulatory Visit: Payer: Medicare Other | Admitting: General Surgery

## 2016-01-26 DIAGNOSIS — N186 End stage renal disease: Secondary | ICD-10-CM | POA: Diagnosis not present

## 2016-01-26 DIAGNOSIS — J441 Chronic obstructive pulmonary disease with (acute) exacerbation: Secondary | ICD-10-CM | POA: Diagnosis not present

## 2016-01-26 DIAGNOSIS — Z992 Dependence on renal dialysis: Secondary | ICD-10-CM | POA: Diagnosis not present

## 2016-01-26 DIAGNOSIS — J9691 Respiratory failure, unspecified with hypoxia: Secondary | ICD-10-CM | POA: Diagnosis not present

## 2016-01-26 DIAGNOSIS — Z298 Encounter for other specified prophylactic measures: Secondary | ICD-10-CM | POA: Diagnosis not present

## 2016-01-26 DIAGNOSIS — I509 Heart failure, unspecified: Secondary | ICD-10-CM | POA: Diagnosis not present

## 2016-01-26 DIAGNOSIS — Z9981 Dependence on supplemental oxygen: Secondary | ICD-10-CM | POA: Diagnosis not present

## 2016-01-26 DIAGNOSIS — R531 Weakness: Secondary | ICD-10-CM | POA: Diagnosis not present

## 2016-01-26 DIAGNOSIS — D509 Iron deficiency anemia, unspecified: Secondary | ICD-10-CM | POA: Diagnosis not present

## 2016-01-26 DIAGNOSIS — Z9181 History of falling: Secondary | ICD-10-CM | POA: Diagnosis not present

## 2016-01-27 DIAGNOSIS — Z298 Encounter for other specified prophylactic measures: Secondary | ICD-10-CM | POA: Diagnosis not present

## 2016-01-27 DIAGNOSIS — J9691 Respiratory failure, unspecified with hypoxia: Secondary | ICD-10-CM | POA: Diagnosis not present

## 2016-01-27 DIAGNOSIS — R531 Weakness: Secondary | ICD-10-CM | POA: Diagnosis not present

## 2016-01-27 DIAGNOSIS — D509 Iron deficiency anemia, unspecified: Secondary | ICD-10-CM | POA: Diagnosis not present

## 2016-01-27 DIAGNOSIS — Z9981 Dependence on supplemental oxygen: Secondary | ICD-10-CM | POA: Diagnosis not present

## 2016-01-27 DIAGNOSIS — Z992 Dependence on renal dialysis: Secondary | ICD-10-CM | POA: Diagnosis not present

## 2016-01-27 DIAGNOSIS — Z9181 History of falling: Secondary | ICD-10-CM | POA: Diagnosis not present

## 2016-01-27 DIAGNOSIS — N186 End stage renal disease: Secondary | ICD-10-CM | POA: Diagnosis not present

## 2016-01-27 DIAGNOSIS — J441 Chronic obstructive pulmonary disease with (acute) exacerbation: Secondary | ICD-10-CM | POA: Diagnosis not present

## 2016-01-27 DIAGNOSIS — I509 Heart failure, unspecified: Secondary | ICD-10-CM | POA: Diagnosis not present

## 2016-01-28 DIAGNOSIS — J441 Chronic obstructive pulmonary disease with (acute) exacerbation: Secondary | ICD-10-CM | POA: Diagnosis not present

## 2016-01-28 DIAGNOSIS — D509 Iron deficiency anemia, unspecified: Secondary | ICD-10-CM | POA: Diagnosis not present

## 2016-01-28 DIAGNOSIS — R531 Weakness: Secondary | ICD-10-CM | POA: Diagnosis not present

## 2016-01-28 DIAGNOSIS — Z9181 History of falling: Secondary | ICD-10-CM | POA: Diagnosis not present

## 2016-01-28 DIAGNOSIS — N186 End stage renal disease: Secondary | ICD-10-CM | POA: Diagnosis not present

## 2016-01-28 DIAGNOSIS — Z9981 Dependence on supplemental oxygen: Secondary | ICD-10-CM | POA: Diagnosis not present

## 2016-01-28 DIAGNOSIS — Z298 Encounter for other specified prophylactic measures: Secondary | ICD-10-CM | POA: Diagnosis not present

## 2016-01-28 DIAGNOSIS — J9691 Respiratory failure, unspecified with hypoxia: Secondary | ICD-10-CM | POA: Diagnosis not present

## 2016-01-28 DIAGNOSIS — Z992 Dependence on renal dialysis: Secondary | ICD-10-CM | POA: Diagnosis not present

## 2016-01-28 DIAGNOSIS — I509 Heart failure, unspecified: Secondary | ICD-10-CM | POA: Diagnosis not present

## 2016-01-29 DIAGNOSIS — D509 Iron deficiency anemia, unspecified: Secondary | ICD-10-CM | POA: Diagnosis not present

## 2016-01-29 DIAGNOSIS — N186 End stage renal disease: Secondary | ICD-10-CM | POA: Diagnosis not present

## 2016-01-29 DIAGNOSIS — Z298 Encounter for other specified prophylactic measures: Secondary | ICD-10-CM | POA: Diagnosis not present

## 2016-01-29 DIAGNOSIS — Z992 Dependence on renal dialysis: Secondary | ICD-10-CM | POA: Diagnosis not present

## 2016-01-30 DIAGNOSIS — Z992 Dependence on renal dialysis: Secondary | ICD-10-CM | POA: Diagnosis not present

## 2016-01-30 DIAGNOSIS — D509 Iron deficiency anemia, unspecified: Secondary | ICD-10-CM | POA: Diagnosis not present

## 2016-01-30 DIAGNOSIS — Z298 Encounter for other specified prophylactic measures: Secondary | ICD-10-CM | POA: Diagnosis not present

## 2016-01-30 DIAGNOSIS — N186 End stage renal disease: Secondary | ICD-10-CM | POA: Diagnosis not present

## 2016-01-31 DIAGNOSIS — R531 Weakness: Secondary | ICD-10-CM | POA: Diagnosis not present

## 2016-01-31 DIAGNOSIS — Z9981 Dependence on supplemental oxygen: Secondary | ICD-10-CM | POA: Diagnosis not present

## 2016-01-31 DIAGNOSIS — Z298 Encounter for other specified prophylactic measures: Secondary | ICD-10-CM | POA: Diagnosis not present

## 2016-01-31 DIAGNOSIS — Z9181 History of falling: Secondary | ICD-10-CM | POA: Diagnosis not present

## 2016-01-31 DIAGNOSIS — J441 Chronic obstructive pulmonary disease with (acute) exacerbation: Secondary | ICD-10-CM | POA: Diagnosis not present

## 2016-01-31 DIAGNOSIS — D509 Iron deficiency anemia, unspecified: Secondary | ICD-10-CM | POA: Diagnosis not present

## 2016-01-31 DIAGNOSIS — I509 Heart failure, unspecified: Secondary | ICD-10-CM | POA: Diagnosis not present

## 2016-01-31 DIAGNOSIS — Z992 Dependence on renal dialysis: Secondary | ICD-10-CM | POA: Diagnosis not present

## 2016-01-31 DIAGNOSIS — N186 End stage renal disease: Secondary | ICD-10-CM | POA: Diagnosis not present

## 2016-01-31 DIAGNOSIS — J9691 Respiratory failure, unspecified with hypoxia: Secondary | ICD-10-CM | POA: Diagnosis not present

## 2016-02-01 DIAGNOSIS — D509 Iron deficiency anemia, unspecified: Secondary | ICD-10-CM | POA: Diagnosis not present

## 2016-02-01 DIAGNOSIS — Z9181 History of falling: Secondary | ICD-10-CM | POA: Diagnosis not present

## 2016-02-01 DIAGNOSIS — N186 End stage renal disease: Secondary | ICD-10-CM | POA: Diagnosis not present

## 2016-02-01 DIAGNOSIS — J9691 Respiratory failure, unspecified with hypoxia: Secondary | ICD-10-CM | POA: Diagnosis not present

## 2016-02-01 DIAGNOSIS — Z992 Dependence on renal dialysis: Secondary | ICD-10-CM | POA: Diagnosis not present

## 2016-02-01 DIAGNOSIS — R531 Weakness: Secondary | ICD-10-CM | POA: Diagnosis not present

## 2016-02-01 DIAGNOSIS — I509 Heart failure, unspecified: Secondary | ICD-10-CM | POA: Diagnosis not present

## 2016-02-01 DIAGNOSIS — J441 Chronic obstructive pulmonary disease with (acute) exacerbation: Secondary | ICD-10-CM | POA: Diagnosis not present

## 2016-02-01 DIAGNOSIS — Z298 Encounter for other specified prophylactic measures: Secondary | ICD-10-CM | POA: Diagnosis not present

## 2016-02-01 DIAGNOSIS — Z9981 Dependence on supplemental oxygen: Secondary | ICD-10-CM | POA: Diagnosis not present

## 2016-02-02 DIAGNOSIS — R531 Weakness: Secondary | ICD-10-CM | POA: Diagnosis not present

## 2016-02-02 DIAGNOSIS — Z9981 Dependence on supplemental oxygen: Secondary | ICD-10-CM | POA: Diagnosis not present

## 2016-02-02 DIAGNOSIS — Z298 Encounter for other specified prophylactic measures: Secondary | ICD-10-CM | POA: Diagnosis not present

## 2016-02-02 DIAGNOSIS — J9691 Respiratory failure, unspecified with hypoxia: Secondary | ICD-10-CM | POA: Diagnosis not present

## 2016-02-02 DIAGNOSIS — J441 Chronic obstructive pulmonary disease with (acute) exacerbation: Secondary | ICD-10-CM | POA: Diagnosis not present

## 2016-02-02 DIAGNOSIS — N186 End stage renal disease: Secondary | ICD-10-CM | POA: Diagnosis not present

## 2016-02-02 DIAGNOSIS — I509 Heart failure, unspecified: Secondary | ICD-10-CM | POA: Diagnosis not present

## 2016-02-02 DIAGNOSIS — Z9181 History of falling: Secondary | ICD-10-CM | POA: Diagnosis not present

## 2016-02-02 DIAGNOSIS — D509 Iron deficiency anemia, unspecified: Secondary | ICD-10-CM | POA: Diagnosis not present

## 2016-02-02 DIAGNOSIS — Z992 Dependence on renal dialysis: Secondary | ICD-10-CM | POA: Diagnosis not present

## 2016-02-03 DIAGNOSIS — N186 End stage renal disease: Secondary | ICD-10-CM | POA: Diagnosis not present

## 2016-02-03 DIAGNOSIS — Z992 Dependence on renal dialysis: Secondary | ICD-10-CM | POA: Diagnosis not present

## 2016-02-03 DIAGNOSIS — Z9181 History of falling: Secondary | ICD-10-CM | POA: Diagnosis not present

## 2016-02-03 DIAGNOSIS — I509 Heart failure, unspecified: Secondary | ICD-10-CM | POA: Diagnosis not present

## 2016-02-03 DIAGNOSIS — J441 Chronic obstructive pulmonary disease with (acute) exacerbation: Secondary | ICD-10-CM | POA: Diagnosis not present

## 2016-02-03 DIAGNOSIS — R531 Weakness: Secondary | ICD-10-CM | POA: Diagnosis not present

## 2016-02-03 DIAGNOSIS — Z298 Encounter for other specified prophylactic measures: Secondary | ICD-10-CM | POA: Diagnosis not present

## 2016-02-03 DIAGNOSIS — Z9981 Dependence on supplemental oxygen: Secondary | ICD-10-CM | POA: Diagnosis not present

## 2016-02-03 DIAGNOSIS — D509 Iron deficiency anemia, unspecified: Secondary | ICD-10-CM | POA: Diagnosis not present

## 2016-02-03 DIAGNOSIS — J9691 Respiratory failure, unspecified with hypoxia: Secondary | ICD-10-CM | POA: Diagnosis not present

## 2016-02-04 DIAGNOSIS — J9691 Respiratory failure, unspecified with hypoxia: Secondary | ICD-10-CM | POA: Diagnosis not present

## 2016-02-04 DIAGNOSIS — J441 Chronic obstructive pulmonary disease with (acute) exacerbation: Secondary | ICD-10-CM | POA: Diagnosis not present

## 2016-02-04 DIAGNOSIS — N186 End stage renal disease: Secondary | ICD-10-CM | POA: Diagnosis not present

## 2016-02-04 DIAGNOSIS — Z9981 Dependence on supplemental oxygen: Secondary | ICD-10-CM | POA: Diagnosis not present

## 2016-02-04 DIAGNOSIS — Z923 Personal history of irradiation: Secondary | ICD-10-CM | POA: Diagnosis not present

## 2016-02-04 DIAGNOSIS — R627 Adult failure to thrive: Secondary | ICD-10-CM | POA: Diagnosis not present

## 2016-02-04 DIAGNOSIS — Z853 Personal history of malignant neoplasm of breast: Secondary | ICD-10-CM | POA: Diagnosis not present

## 2016-02-04 DIAGNOSIS — Z812 Family history of tobacco abuse and dependence: Secondary | ICD-10-CM | POA: Diagnosis not present

## 2016-02-04 DIAGNOSIS — Z9181 History of falling: Secondary | ICD-10-CM | POA: Diagnosis not present

## 2016-02-04 DIAGNOSIS — I509 Heart failure, unspecified: Secondary | ICD-10-CM | POA: Diagnosis not present

## 2016-02-04 DIAGNOSIS — R531 Weakness: Secondary | ICD-10-CM | POA: Diagnosis not present

## 2016-02-04 DIAGNOSIS — Z992 Dependence on renal dialysis: Secondary | ICD-10-CM | POA: Diagnosis not present

## 2016-02-05 DIAGNOSIS — N186 End stage renal disease: Secondary | ICD-10-CM | POA: Diagnosis not present

## 2016-02-05 DIAGNOSIS — Z992 Dependence on renal dialysis: Secondary | ICD-10-CM | POA: Diagnosis not present

## 2016-02-06 DIAGNOSIS — Z992 Dependence on renal dialysis: Secondary | ICD-10-CM | POA: Diagnosis not present

## 2016-02-06 DIAGNOSIS — N186 End stage renal disease: Secondary | ICD-10-CM | POA: Diagnosis not present

## 2016-02-07 DIAGNOSIS — I509 Heart failure, unspecified: Secondary | ICD-10-CM | POA: Diagnosis not present

## 2016-02-07 DIAGNOSIS — N186 End stage renal disease: Secondary | ICD-10-CM | POA: Diagnosis not present

## 2016-02-07 DIAGNOSIS — Z9981 Dependence on supplemental oxygen: Secondary | ICD-10-CM | POA: Diagnosis not present

## 2016-02-07 DIAGNOSIS — R531 Weakness: Secondary | ICD-10-CM | POA: Diagnosis not present

## 2016-02-07 DIAGNOSIS — J441 Chronic obstructive pulmonary disease with (acute) exacerbation: Secondary | ICD-10-CM | POA: Diagnosis not present

## 2016-02-07 DIAGNOSIS — Z992 Dependence on renal dialysis: Secondary | ICD-10-CM | POA: Diagnosis not present

## 2016-02-07 DIAGNOSIS — Z9181 History of falling: Secondary | ICD-10-CM | POA: Diagnosis not present

## 2016-02-07 DIAGNOSIS — J9691 Respiratory failure, unspecified with hypoxia: Secondary | ICD-10-CM | POA: Diagnosis not present

## 2016-02-08 ENCOUNTER — Encounter: Payer: Self-pay | Admitting: Family

## 2016-02-08 ENCOUNTER — Ambulatory Visit: Attending: Family | Admitting: Family

## 2016-02-08 VITALS — BP 124/74 | HR 95 | Resp 18 | Ht 68.0 in | Wt 129.0 lb

## 2016-02-08 DIAGNOSIS — Z7982 Long term (current) use of aspirin: Secondary | ICD-10-CM | POA: Insufficient documentation

## 2016-02-08 DIAGNOSIS — Z9181 History of falling: Secondary | ICD-10-CM | POA: Diagnosis not present

## 2016-02-08 DIAGNOSIS — I5022 Chronic systolic (congestive) heart failure: Secondary | ICD-10-CM | POA: Diagnosis present

## 2016-02-08 DIAGNOSIS — Z8673 Personal history of transient ischemic attack (TIA), and cerebral infarction without residual deficits: Secondary | ICD-10-CM | POA: Insufficient documentation

## 2016-02-08 DIAGNOSIS — I132 Hypertensive heart and chronic kidney disease with heart failure and with stage 5 chronic kidney disease, or end stage renal disease: Secondary | ICD-10-CM | POA: Diagnosis not present

## 2016-02-08 DIAGNOSIS — Z87891 Personal history of nicotine dependence: Secondary | ICD-10-CM | POA: Diagnosis not present

## 2016-02-08 DIAGNOSIS — Z853 Personal history of malignant neoplasm of breast: Secondary | ICD-10-CM | POA: Insufficient documentation

## 2016-02-08 DIAGNOSIS — Z880 Allergy status to penicillin: Secondary | ICD-10-CM | POA: Diagnosis not present

## 2016-02-08 DIAGNOSIS — J9691 Respiratory failure, unspecified with hypoxia: Secondary | ICD-10-CM | POA: Diagnosis not present

## 2016-02-08 DIAGNOSIS — I950 Idiopathic hypotension: Secondary | ICD-10-CM

## 2016-02-08 DIAGNOSIS — F419 Anxiety disorder, unspecified: Secondary | ICD-10-CM | POA: Insufficient documentation

## 2016-02-08 DIAGNOSIS — I639 Cerebral infarction, unspecified: Secondary | ICD-10-CM

## 2016-02-08 DIAGNOSIS — J441 Chronic obstructive pulmonary disease with (acute) exacerbation: Secondary | ICD-10-CM | POA: Diagnosis not present

## 2016-02-08 DIAGNOSIS — K219 Gastro-esophageal reflux disease without esophagitis: Secondary | ICD-10-CM | POA: Insufficient documentation

## 2016-02-08 DIAGNOSIS — R531 Weakness: Secondary | ICD-10-CM | POA: Diagnosis not present

## 2016-02-08 DIAGNOSIS — Z79899 Other long term (current) drug therapy: Secondary | ICD-10-CM | POA: Diagnosis not present

## 2016-02-08 DIAGNOSIS — I509 Heart failure, unspecified: Secondary | ICD-10-CM | POA: Diagnosis not present

## 2016-02-08 DIAGNOSIS — J449 Chronic obstructive pulmonary disease, unspecified: Secondary | ICD-10-CM | POA: Diagnosis not present

## 2016-02-08 DIAGNOSIS — Z9981 Dependence on supplemental oxygen: Secondary | ICD-10-CM | POA: Diagnosis not present

## 2016-02-08 DIAGNOSIS — N186 End stage renal disease: Secondary | ICD-10-CM

## 2016-02-08 DIAGNOSIS — Z992 Dependence on renal dialysis: Secondary | ICD-10-CM | POA: Insufficient documentation

## 2016-02-08 DIAGNOSIS — I959 Hypotension, unspecified: Secondary | ICD-10-CM | POA: Diagnosis not present

## 2016-02-08 NOTE — Progress Notes (Signed)
Patient ID: Lydia Clark, female    DOB: 03/21/1940, 75 y.o.   MRN: [id number]  HPI  Ms Demichael is a 75 y/o female with a history of recent CVA (Nov 2017), PE, breast cancer, HTN, GERD, COPD, ESRD on dialysis, anxiety, previous tobacco use and chronic heart failure.  Last echo was done 01/17/16 which showed an EF of >55% with aortic sclerosis. EF has improved from 30-35% on 02/23/15.  Was last admitted to [facility] on 01/15/16 with an acute stroke. PT and OT consults were obtained. Patient recovered quickly and was discharged home with hospice after 2 days. Did have MRI, brain MRA, EEG and echo done while hospitalized. Previous admission was December 2016 for pulmonary edema from acute systolic heart failure.   She presents today for a follow-up visit with fatigue and shortness of breath with little exertion. Denies any swelling in her legs/abdomen. She continues to weigh herself daily and says that her weight has been stable. She feels like her appetite has been ok. She continues with nightly home peritoneal dialysis. She admits that she really isn't doing much activity as she gets tired so easily.    Past Medical History:  Diagnosis Date  . Allergy   . Anxiety    anxiety  . Arthritis   . Breast screening, unspecified 2013  . Cancer [facility]) 2008   left breast  . CHF (congestive heart failure) ([place])   . Chronic kidney disease 2014   stage IV chronic   . COPD (chronic obstructive pulmonary disease) ([place])   . Dialysis patient ([place]) 2014  . Endocrine disorder 2008   thyroid  . GERD (gastroesophageal reflux disease)   . Heart murmur    child  . History of kidney stones   . Hypertension 2006  . Multinodular goiter    followed by Dr. Carloyn Manner @ [facility]  . PE (pulmonary embolism)    patient denies  . Personal history of malignant neoplasm of breast 2008  . Personal history of tobacco use, presenting hazards to health 2012   30 yrs smoking  . Pneumonia    hx  . Special  screening for malignant neoplasms, colon 2013  . Stroke ([place])   . Swelling of throat    swelling at base of throat,   Past Surgical History:  Procedure Laterality Date  . APPENDECTOMY  2005  . BREAST BIOPSY  2005  . BREAST SURGERY Left 2008   lumpectomy  . COLONOSCOPY  2007   done in [place]  . FOOT SURGERY  2005  . fooy Left    lft foot  . HIP ARTHROPLASTY Left 01/10/2015   Procedure: ARTHROPLASTY BIPOLAR HIP (HEMIARTHROPLASTY);  Surgeon: Claud Kelp, MD;  Location: [facility];  Service: Orthopedics;  Laterality: Left;  . INSERTION OF DIALYSIS CATHETER Right 07/02/2012   Procedure: INSERTION OF DIALYSIS CATHETER;  Surgeon: Elam Dutch, MD;  Location: [place];  Service: Vascular;  Laterality: Right;  Right Internal Jugular  . INSERTION OF DIALYSIS CATHETER Right July 02 2012   right chest/ temporary cath  . PERIPHERAL VASCULAR CATHETERIZATION N/A 01/13/2015   Procedure: Dialysis/Perma Catheter Insertion;  Surgeon: Algernon Huxley, MD;  Location: [place] [facility];  Service: Cardiovascular;  Laterality: N/A;  . REPLACEMENT TOTAL KNEE Left 2012   Partial   . SPLENECTOMY, TOTAL  1956   not sure if partial or total  . TONSILLECTOMY  1950    Family History  Problem Relation Age of Onset  .  Heart attack Father   . Pneumonia Father   . Breast cancer Maternal Aunt   . Breast cancer Paternal Aunt     Social History  Substance Use Topics  . Smoking status: Former Smoker    Packs/day: 0.30    Years: 30.00    Types: Cigarettes    Quit date: 01/10/2015  . Smokeless tobacco: Never Used  . Alcohol use No    Allergies  Allergen Reactions  . Bee Venom Swelling and Other (See Comments)    Breathing problems  . Biaxin [Clarithromycin] Other (See Comments)    Throat swells  . Hydrocodone Nausea And Vomiting  . Mycostatin [Nystatin] Other (See Comments)    Blisters from the ointment  . Polysorbate     Other reaction(s): Unknown  . Sulfa Antibiotics Other (See Comments)     "welps"  . Penicillins Rash   Prior to Admission medications   Medication Sig Start Date End Date Taking? Authorizing Provider  amitriptyline (ELAVIL) 25 MG tablet Take 25 mg by mouth at bedtime.   Yes Historical Provider, MD  aspirin 81 MG tablet Take 81 mg by mouth daily.   Yes Historical Provider, MD  calcium acetate (PHOSLO) 667 MG capsule Take 667 mg by mouth 3 (three) times daily with meals.   Yes Historical Provider, MD  Fluticasone-Salmeterol (ADVAIR DISKUS) 250-50 MCG/DOSE AEPB Inhale 1 puff into the lungs every 12 (twelve) hours. 03/03/15 03/02/16 Yes Laverle Hobby, MD  omeprazole (PRILOSEC) 20 MG capsule Take 20 mg by mouth daily.   Yes Historical Provider, MD  rosuvastatin (CRESTOR) 10 MG tablet Take 10 mg by mouth daily.   Yes Historical Provider, MD  tiotropium (SPIRIVA) 18 MCG inhalation capsule Place 1 capsule (18 mcg total) into inhaler and inhale at bedtime. 03/03/15  Yes Laverle Hobby, MD    Review of Systems  Constitutional: Positive for fatigue. Negative for appetite change.  HENT: Negative for congestion, postnasal drip and sore throat.   Eyes: Negative.   Respiratory: Positive for shortness of breath. Negative for cough and chest tightness.   Cardiovascular: Negative for chest pain, palpitations and leg swelling.  Gastrointestinal: Negative for abdominal distention and abdominal pain.  Endocrine: Negative.   Genitourinary: Negative.   Musculoskeletal: Negative.   Skin: Negative.   Allergic/Immunologic: Negative.   Neurological: Negative for dizziness and light-headedness.  Hematological: Negative for adenopathy. Does not bruise/bleed easily.  Psychiatric/Behavioral: Positive for sleep disturbance (restless at times). Negative for dysphoric mood. The patient is not nervous/anxious.    Vitals:   02/08/16 1002  BP: 124/74  Pulse: 95  Resp: 18  SpO2: 100%  Weight: 129 lb (58.5 kg)  Height: 5\' 8"  (1.727 m)   Wt Readings from Last 3 Encounters:   02/08/16 129 lb (58.5 kg)  08/09/15 132 lb (59.9 kg)  05/07/15 130 lb (59 kg)   Lab Results  Component Value Date   CREATININE 3.99 (H) 02/24/2015   CREATININE 5.78 (H) 02/23/2015   CREATININE 5.84 (H) 01/14/2015    Physical Exam  Constitutional: She is oriented to person, place, and time. She appears well-developed and well-nourished.  HENT:  Head: Normocephalic and atraumatic.  Eyes: Conjunctivae are normal. Pupils are equal, round, and reactive to light.  Neck: Normal range of motion. Neck supple.  Cardiovascular: Regular rhythm.  Tachycardia present.   Pulmonary/Chest: Effort normal. She has no wheezes. She has no rales.  Abdominal: Soft. She exhibits no distension. There is no tenderness.  Musculoskeletal: She exhibits no edema or tenderness.  Neurological: She is alert and oriented to person, place, and time.  Skin: Skin is warm and dry.  Psychiatric: She has a normal mood and affect. Her behavior is normal. Thought content normal.  Nursing note and vitals reviewed.     Assessment & Plan:  1: Chronic heart failure with reduced ejection fraction- - NYHA class III - euvolemic - continue weighing daily and call for an overnight weight gain of >2 pounds or a weekly weight gain of >5 pounds - not adding salt to her food - has received her flu vaccine for this year - EF has normalized - wears oxygent at 1.5-2L at bedtime  2: Hypotension- - BP looks good today - no dizziness  3: CKD with ESRD on dialysis- - continues with home peritoneal dialysis  - sees [facility] 02/10/16  4: CVA- - recent CVA but without residual - encouraged as much activity as she can tolerate  Patient opts to return as needed. Advised patient and son that they could call at any time to make another appointment.

## 2016-02-08 NOTE — Patient Instructions (Signed)
Increase fluid intake and try to drink some every hour.

## 2016-02-09 DIAGNOSIS — Z9181 History of falling: Secondary | ICD-10-CM | POA: Diagnosis not present

## 2016-02-09 DIAGNOSIS — J9691 Respiratory failure, unspecified with hypoxia: Secondary | ICD-10-CM | POA: Diagnosis not present

## 2016-02-09 DIAGNOSIS — Z9981 Dependence on supplemental oxygen: Secondary | ICD-10-CM | POA: Diagnosis not present

## 2016-02-09 DIAGNOSIS — Z992 Dependence on renal dialysis: Secondary | ICD-10-CM | POA: Diagnosis not present

## 2016-02-09 DIAGNOSIS — R531 Weakness: Secondary | ICD-10-CM | POA: Diagnosis not present

## 2016-02-09 DIAGNOSIS — J441 Chronic obstructive pulmonary disease with (acute) exacerbation: Secondary | ICD-10-CM | POA: Diagnosis not present

## 2016-02-09 DIAGNOSIS — I509 Heart failure, unspecified: Secondary | ICD-10-CM | POA: Diagnosis not present

## 2016-02-09 DIAGNOSIS — N186 End stage renal disease: Secondary | ICD-10-CM | POA: Diagnosis not present

## 2016-02-10 DIAGNOSIS — J441 Chronic obstructive pulmonary disease with (acute) exacerbation: Secondary | ICD-10-CM | POA: Diagnosis not present

## 2016-02-10 DIAGNOSIS — J9691 Respiratory failure, unspecified with hypoxia: Secondary | ICD-10-CM | POA: Diagnosis not present

## 2016-02-10 DIAGNOSIS — Z992 Dependence on renal dialysis: Secondary | ICD-10-CM | POA: Diagnosis not present

## 2016-02-10 DIAGNOSIS — Z9981 Dependence on supplemental oxygen: Secondary | ICD-10-CM | POA: Diagnosis not present

## 2016-02-10 DIAGNOSIS — I509 Heart failure, unspecified: Secondary | ICD-10-CM | POA: Diagnosis not present

## 2016-02-10 DIAGNOSIS — N186 End stage renal disease: Secondary | ICD-10-CM | POA: Diagnosis not present

## 2016-02-10 DIAGNOSIS — Z9181 History of falling: Secondary | ICD-10-CM | POA: Diagnosis not present

## 2016-02-10 DIAGNOSIS — R531 Weakness: Secondary | ICD-10-CM | POA: Diagnosis not present

## 2016-02-11 ENCOUNTER — Other Ambulatory Visit: Payer: Self-pay | Admitting: Nephrology

## 2016-02-11 DIAGNOSIS — I509 Heart failure, unspecified: Secondary | ICD-10-CM | POA: Diagnosis not present

## 2016-02-11 DIAGNOSIS — Z9981 Dependence on supplemental oxygen: Secondary | ICD-10-CM | POA: Diagnosis not present

## 2016-02-11 DIAGNOSIS — J441 Chronic obstructive pulmonary disease with (acute) exacerbation: Secondary | ICD-10-CM | POA: Diagnosis not present

## 2016-02-11 DIAGNOSIS — Z9181 History of falling: Secondary | ICD-10-CM | POA: Diagnosis not present

## 2016-02-11 DIAGNOSIS — J9691 Respiratory failure, unspecified with hypoxia: Secondary | ICD-10-CM | POA: Diagnosis not present

## 2016-02-11 DIAGNOSIS — Z992 Dependence on renal dialysis: Secondary | ICD-10-CM | POA: Diagnosis not present

## 2016-02-11 DIAGNOSIS — N186 End stage renal disease: Secondary | ICD-10-CM | POA: Diagnosis not present

## 2016-02-11 DIAGNOSIS — Z853 Personal history of malignant neoplasm of breast: Secondary | ICD-10-CM

## 2016-02-11 DIAGNOSIS — R531 Weakness: Secondary | ICD-10-CM | POA: Diagnosis not present

## 2016-02-12 DIAGNOSIS — Z992 Dependence on renal dialysis: Secondary | ICD-10-CM | POA: Diagnosis not present

## 2016-02-12 DIAGNOSIS — N186 End stage renal disease: Secondary | ICD-10-CM | POA: Diagnosis not present

## 2016-02-13 DIAGNOSIS — Z992 Dependence on renal dialysis: Secondary | ICD-10-CM | POA: Diagnosis not present

## 2016-02-13 DIAGNOSIS — N186 End stage renal disease: Secondary | ICD-10-CM | POA: Diagnosis not present

## 2016-02-14 DIAGNOSIS — Z9181 History of falling: Secondary | ICD-10-CM | POA: Diagnosis not present

## 2016-02-14 DIAGNOSIS — Z9981 Dependence on supplemental oxygen: Secondary | ICD-10-CM | POA: Diagnosis not present

## 2016-02-14 DIAGNOSIS — I509 Heart failure, unspecified: Secondary | ICD-10-CM | POA: Diagnosis not present

## 2016-02-14 DIAGNOSIS — R531 Weakness: Secondary | ICD-10-CM | POA: Diagnosis not present

## 2016-02-14 DIAGNOSIS — Z992 Dependence on renal dialysis: Secondary | ICD-10-CM | POA: Diagnosis not present

## 2016-02-14 DIAGNOSIS — J9691 Respiratory failure, unspecified with hypoxia: Secondary | ICD-10-CM | POA: Diagnosis not present

## 2016-02-14 DIAGNOSIS — J441 Chronic obstructive pulmonary disease with (acute) exacerbation: Secondary | ICD-10-CM | POA: Diagnosis not present

## 2016-02-14 DIAGNOSIS — N186 End stage renal disease: Secondary | ICD-10-CM | POA: Diagnosis not present

## 2016-02-15 ENCOUNTER — Other Ambulatory Visit: Payer: Self-pay | Admitting: Nephrology

## 2016-02-15 DIAGNOSIS — Z1231 Encounter for screening mammogram for malignant neoplasm of breast: Secondary | ICD-10-CM

## 2016-02-15 DIAGNOSIS — N186 End stage renal disease: Secondary | ICD-10-CM | POA: Diagnosis not present

## 2016-02-15 DIAGNOSIS — Z992 Dependence on renal dialysis: Secondary | ICD-10-CM | POA: Diagnosis not present

## 2016-02-15 DIAGNOSIS — J441 Chronic obstructive pulmonary disease with (acute) exacerbation: Secondary | ICD-10-CM | POA: Diagnosis not present

## 2016-02-15 DIAGNOSIS — I509 Heart failure, unspecified: Secondary | ICD-10-CM | POA: Diagnosis not present

## 2016-02-15 DIAGNOSIS — Z9181 History of falling: Secondary | ICD-10-CM | POA: Diagnosis not present

## 2016-02-15 DIAGNOSIS — Z9981 Dependence on supplemental oxygen: Secondary | ICD-10-CM | POA: Diagnosis not present

## 2016-02-15 DIAGNOSIS — R531 Weakness: Secondary | ICD-10-CM | POA: Diagnosis not present

## 2016-02-15 DIAGNOSIS — J9691 Respiratory failure, unspecified with hypoxia: Secondary | ICD-10-CM | POA: Diagnosis not present

## 2016-02-16 DIAGNOSIS — Z9981 Dependence on supplemental oxygen: Secondary | ICD-10-CM | POA: Diagnosis not present

## 2016-02-16 DIAGNOSIS — J441 Chronic obstructive pulmonary disease with (acute) exacerbation: Secondary | ICD-10-CM | POA: Diagnosis not present

## 2016-02-16 DIAGNOSIS — Z992 Dependence on renal dialysis: Secondary | ICD-10-CM | POA: Diagnosis not present

## 2016-02-16 DIAGNOSIS — N186 End stage renal disease: Secondary | ICD-10-CM | POA: Diagnosis not present

## 2016-02-16 DIAGNOSIS — I509 Heart failure, unspecified: Secondary | ICD-10-CM | POA: Diagnosis not present

## 2016-02-16 DIAGNOSIS — R531 Weakness: Secondary | ICD-10-CM | POA: Diagnosis not present

## 2016-02-16 DIAGNOSIS — J9691 Respiratory failure, unspecified with hypoxia: Secondary | ICD-10-CM | POA: Diagnosis not present

## 2016-02-16 DIAGNOSIS — Z9181 History of falling: Secondary | ICD-10-CM | POA: Diagnosis not present

## 2016-02-17 ENCOUNTER — Ambulatory Visit

## 2016-02-17 DIAGNOSIS — Z9981 Dependence on supplemental oxygen: Secondary | ICD-10-CM | POA: Diagnosis not present

## 2016-02-17 DIAGNOSIS — Z992 Dependence on renal dialysis: Secondary | ICD-10-CM | POA: Diagnosis not present

## 2016-02-17 DIAGNOSIS — I509 Heart failure, unspecified: Secondary | ICD-10-CM | POA: Diagnosis not present

## 2016-02-17 DIAGNOSIS — N186 End stage renal disease: Secondary | ICD-10-CM | POA: Diagnosis not present

## 2016-02-17 DIAGNOSIS — J441 Chronic obstructive pulmonary disease with (acute) exacerbation: Secondary | ICD-10-CM | POA: Diagnosis not present

## 2016-02-17 DIAGNOSIS — Z9181 History of falling: Secondary | ICD-10-CM | POA: Diagnosis not present

## 2016-02-17 DIAGNOSIS — R531 Weakness: Secondary | ICD-10-CM | POA: Diagnosis not present

## 2016-02-17 DIAGNOSIS — J9691 Respiratory failure, unspecified with hypoxia: Secondary | ICD-10-CM | POA: Diagnosis not present

## 2016-02-18 DIAGNOSIS — J9691 Respiratory failure, unspecified with hypoxia: Secondary | ICD-10-CM | POA: Diagnosis not present

## 2016-02-18 DIAGNOSIS — Z9181 History of falling: Secondary | ICD-10-CM | POA: Diagnosis not present

## 2016-02-18 DIAGNOSIS — Z992 Dependence on renal dialysis: Secondary | ICD-10-CM | POA: Diagnosis not present

## 2016-02-18 DIAGNOSIS — I509 Heart failure, unspecified: Secondary | ICD-10-CM | POA: Diagnosis not present

## 2016-02-18 DIAGNOSIS — R531 Weakness: Secondary | ICD-10-CM | POA: Diagnosis not present

## 2016-02-18 DIAGNOSIS — N186 End stage renal disease: Secondary | ICD-10-CM | POA: Diagnosis not present

## 2016-02-18 DIAGNOSIS — J441 Chronic obstructive pulmonary disease with (acute) exacerbation: Secondary | ICD-10-CM | POA: Diagnosis not present

## 2016-02-18 DIAGNOSIS — Z9981 Dependence on supplemental oxygen: Secondary | ICD-10-CM | POA: Diagnosis not present

## 2016-02-19 DIAGNOSIS — N186 End stage renal disease: Secondary | ICD-10-CM | POA: Diagnosis not present

## 2016-02-19 DIAGNOSIS — Z992 Dependence on renal dialysis: Secondary | ICD-10-CM | POA: Diagnosis not present

## 2016-02-20 DIAGNOSIS — Z992 Dependence on renal dialysis: Secondary | ICD-10-CM | POA: Diagnosis not present

## 2016-02-20 DIAGNOSIS — N186 End stage renal disease: Secondary | ICD-10-CM | POA: Diagnosis not present

## 2016-02-21 DIAGNOSIS — Z9181 History of falling: Secondary | ICD-10-CM | POA: Diagnosis not present

## 2016-02-21 DIAGNOSIS — N186 End stage renal disease: Secondary | ICD-10-CM | POA: Diagnosis not present

## 2016-02-21 DIAGNOSIS — R531 Weakness: Secondary | ICD-10-CM | POA: Diagnosis not present

## 2016-02-21 DIAGNOSIS — J441 Chronic obstructive pulmonary disease with (acute) exacerbation: Secondary | ICD-10-CM | POA: Diagnosis not present

## 2016-02-21 DIAGNOSIS — Z9981 Dependence on supplemental oxygen: Secondary | ICD-10-CM | POA: Diagnosis not present

## 2016-02-21 DIAGNOSIS — Z992 Dependence on renal dialysis: Secondary | ICD-10-CM | POA: Diagnosis not present

## 2016-02-21 DIAGNOSIS — J9691 Respiratory failure, unspecified with hypoxia: Secondary | ICD-10-CM | POA: Diagnosis not present

## 2016-02-21 DIAGNOSIS — I509 Heart failure, unspecified: Secondary | ICD-10-CM | POA: Diagnosis not present

## 2016-02-22 DIAGNOSIS — N186 End stage renal disease: Secondary | ICD-10-CM | POA: Diagnosis not present

## 2016-02-22 DIAGNOSIS — J441 Chronic obstructive pulmonary disease with (acute) exacerbation: Secondary | ICD-10-CM | POA: Diagnosis not present

## 2016-02-22 DIAGNOSIS — R531 Weakness: Secondary | ICD-10-CM | POA: Diagnosis not present

## 2016-02-22 DIAGNOSIS — I509 Heart failure, unspecified: Secondary | ICD-10-CM | POA: Diagnosis not present

## 2016-02-22 DIAGNOSIS — Z9981 Dependence on supplemental oxygen: Secondary | ICD-10-CM | POA: Diagnosis not present

## 2016-02-22 DIAGNOSIS — J9691 Respiratory failure, unspecified with hypoxia: Secondary | ICD-10-CM | POA: Diagnosis not present

## 2016-02-22 DIAGNOSIS — Z992 Dependence on renal dialysis: Secondary | ICD-10-CM | POA: Diagnosis not present

## 2016-02-22 DIAGNOSIS — Z9181 History of falling: Secondary | ICD-10-CM | POA: Diagnosis not present

## 2016-02-23 DIAGNOSIS — I509 Heart failure, unspecified: Secondary | ICD-10-CM | POA: Diagnosis not present

## 2016-02-23 DIAGNOSIS — Z9181 History of falling: Secondary | ICD-10-CM | POA: Diagnosis not present

## 2016-02-23 DIAGNOSIS — R531 Weakness: Secondary | ICD-10-CM | POA: Diagnosis not present

## 2016-02-23 DIAGNOSIS — J9691 Respiratory failure, unspecified with hypoxia: Secondary | ICD-10-CM | POA: Diagnosis not present

## 2016-02-23 DIAGNOSIS — Z992 Dependence on renal dialysis: Secondary | ICD-10-CM | POA: Diagnosis not present

## 2016-02-23 DIAGNOSIS — J441 Chronic obstructive pulmonary disease with (acute) exacerbation: Secondary | ICD-10-CM | POA: Diagnosis not present

## 2016-02-23 DIAGNOSIS — Z9981 Dependence on supplemental oxygen: Secondary | ICD-10-CM | POA: Diagnosis not present

## 2016-02-23 DIAGNOSIS — N186 End stage renal disease: Secondary | ICD-10-CM | POA: Diagnosis not present

## 2016-02-24 DIAGNOSIS — J441 Chronic obstructive pulmonary disease with (acute) exacerbation: Secondary | ICD-10-CM | POA: Diagnosis not present

## 2016-02-24 DIAGNOSIS — Z9981 Dependence on supplemental oxygen: Secondary | ICD-10-CM | POA: Diagnosis not present

## 2016-02-24 DIAGNOSIS — R531 Weakness: Secondary | ICD-10-CM | POA: Diagnosis not present

## 2016-02-24 DIAGNOSIS — Z992 Dependence on renal dialysis: Secondary | ICD-10-CM | POA: Diagnosis not present

## 2016-02-24 DIAGNOSIS — I509 Heart failure, unspecified: Secondary | ICD-10-CM | POA: Diagnosis not present

## 2016-02-24 DIAGNOSIS — N186 End stage renal disease: Secondary | ICD-10-CM | POA: Diagnosis not present

## 2016-02-24 DIAGNOSIS — J9691 Respiratory failure, unspecified with hypoxia: Secondary | ICD-10-CM | POA: Diagnosis not present

## 2016-02-24 DIAGNOSIS — Z9181 History of falling: Secondary | ICD-10-CM | POA: Diagnosis not present

## 2016-02-25 ENCOUNTER — Ambulatory Visit
Admission: RE | Admit: 2016-02-25 | Discharge: 2016-02-25 | Disposition: A | Discharge status: Home / Self Care | Source: Ambulatory Visit | Attending: Nephrology | Admitting: Nephrology

## 2016-02-25 DIAGNOSIS — N186 End stage renal disease: Secondary | ICD-10-CM | POA: Diagnosis not present

## 2016-02-25 DIAGNOSIS — E041 Nontoxic single thyroid nodule: Secondary | ICD-10-CM | POA: Insufficient documentation

## 2016-02-25 DIAGNOSIS — R918 Other nonspecific abnormal finding of lung field: Secondary | ICD-10-CM | POA: Diagnosis not present

## 2016-02-25 DIAGNOSIS — J441 Chronic obstructive pulmonary disease with (acute) exacerbation: Secondary | ICD-10-CM | POA: Diagnosis not present

## 2016-02-25 DIAGNOSIS — N281 Cyst of kidney, acquired: Secondary | ICD-10-CM | POA: Diagnosis not present

## 2016-02-25 DIAGNOSIS — J9691 Respiratory failure, unspecified with hypoxia: Secondary | ICD-10-CM | POA: Diagnosis not present

## 2016-02-25 DIAGNOSIS — Z9981 Dependence on supplemental oxygen: Secondary | ICD-10-CM | POA: Diagnosis not present

## 2016-02-25 DIAGNOSIS — I509 Heart failure, unspecified: Secondary | ICD-10-CM | POA: Diagnosis not present

## 2016-02-25 DIAGNOSIS — Z853 Personal history of malignant neoplasm of breast: Secondary | ICD-10-CM | POA: Diagnosis not present

## 2016-02-25 DIAGNOSIS — Z9181 History of falling: Secondary | ICD-10-CM | POA: Diagnosis not present

## 2016-02-25 DIAGNOSIS — R531 Weakness: Secondary | ICD-10-CM | POA: Diagnosis not present

## 2016-02-25 DIAGNOSIS — Z992 Dependence on renal dialysis: Secondary | ICD-10-CM | POA: Diagnosis not present

## 2016-02-26 DIAGNOSIS — J441 Chronic obstructive pulmonary disease with (acute) exacerbation: Secondary | ICD-10-CM | POA: Diagnosis not present

## 2016-02-26 DIAGNOSIS — Z992 Dependence on renal dialysis: Secondary | ICD-10-CM | POA: Diagnosis not present

## 2016-02-26 DIAGNOSIS — Z9181 History of falling: Secondary | ICD-10-CM | POA: Diagnosis not present

## 2016-02-26 DIAGNOSIS — N186 End stage renal disease: Secondary | ICD-10-CM | POA: Diagnosis not present

## 2016-02-26 DIAGNOSIS — J9691 Respiratory failure, unspecified with hypoxia: Secondary | ICD-10-CM | POA: Diagnosis not present

## 2016-02-26 DIAGNOSIS — Z9981 Dependence on supplemental oxygen: Secondary | ICD-10-CM | POA: Diagnosis not present

## 2016-02-26 DIAGNOSIS — R531 Weakness: Secondary | ICD-10-CM | POA: Diagnosis not present

## 2016-02-26 DIAGNOSIS — I509 Heart failure, unspecified: Secondary | ICD-10-CM | POA: Diagnosis not present

## 2016-02-27 DIAGNOSIS — Z992 Dependence on renal dialysis: Secondary | ICD-10-CM | POA: Diagnosis not present

## 2016-02-27 DIAGNOSIS — N186 End stage renal disease: Secondary | ICD-10-CM | POA: Diagnosis not present

## 2016-02-28 DIAGNOSIS — J441 Chronic obstructive pulmonary disease with (acute) exacerbation: Secondary | ICD-10-CM | POA: Diagnosis not present

## 2016-02-28 DIAGNOSIS — Z992 Dependence on renal dialysis: Secondary | ICD-10-CM | POA: Diagnosis not present

## 2016-02-28 DIAGNOSIS — Z9181 History of falling: Secondary | ICD-10-CM | POA: Diagnosis not present

## 2016-02-28 DIAGNOSIS — J9691 Respiratory failure, unspecified with hypoxia: Secondary | ICD-10-CM | POA: Diagnosis not present

## 2016-02-28 DIAGNOSIS — Z9981 Dependence on supplemental oxygen: Secondary | ICD-10-CM | POA: Diagnosis not present

## 2016-02-28 DIAGNOSIS — R531 Weakness: Secondary | ICD-10-CM | POA: Diagnosis not present

## 2016-02-28 DIAGNOSIS — I509 Heart failure, unspecified: Secondary | ICD-10-CM | POA: Diagnosis not present

## 2016-02-28 DIAGNOSIS — N186 End stage renal disease: Secondary | ICD-10-CM | POA: Diagnosis not present

## 2016-02-29 DIAGNOSIS — J441 Chronic obstructive pulmonary disease with (acute) exacerbation: Secondary | ICD-10-CM | POA: Diagnosis not present

## 2016-02-29 DIAGNOSIS — I509 Heart failure, unspecified: Secondary | ICD-10-CM | POA: Diagnosis not present

## 2016-02-29 DIAGNOSIS — Z992 Dependence on renal dialysis: Secondary | ICD-10-CM | POA: Diagnosis not present

## 2016-02-29 DIAGNOSIS — J9691 Respiratory failure, unspecified with hypoxia: Secondary | ICD-10-CM | POA: Diagnosis not present

## 2016-02-29 DIAGNOSIS — Z9981 Dependence on supplemental oxygen: Secondary | ICD-10-CM | POA: Diagnosis not present

## 2016-02-29 DIAGNOSIS — Z9181 History of falling: Secondary | ICD-10-CM | POA: Diagnosis not present

## 2016-02-29 DIAGNOSIS — N186 End stage renal disease: Secondary | ICD-10-CM | POA: Diagnosis not present

## 2016-02-29 DIAGNOSIS — R531 Weakness: Secondary | ICD-10-CM | POA: Diagnosis not present

## 2016-03-01 ENCOUNTER — Emergency Department (HOSPITAL_COMMUNITY): Payer: Medicare Other

## 2016-03-01 ENCOUNTER — Inpatient Hospital Stay (HOSPITAL_COMMUNITY)
Admission: EM | Admit: 2016-03-01 | Discharge: 2016-03-03 | DRG: 100 | Disposition: A | Discharge status: Skilled Nursing Facility | Payer: Medicare Other | Attending: Internal Medicine | Admitting: Internal Medicine

## 2016-03-01 ENCOUNTER — Observation Stay (HOSPITAL_BASED_OUTPATIENT_CLINIC_OR_DEPARTMENT_OTHER)
Admit: 2016-03-01 | Discharge: 2016-03-01 | Disposition: A | Discharge status: Home / Self Care | Payer: Medicare Other | Attending: Internal Medicine | Admitting: Internal Medicine

## 2016-03-01 ENCOUNTER — Telehealth: Payer: Self-pay | Admitting: Gastroenterology

## 2016-03-01 ENCOUNTER — Encounter (HOSPITAL_COMMUNITY): Payer: Self-pay | Admitting: *Deleted

## 2016-03-01 ENCOUNTER — Observation Stay (HOSPITAL_COMMUNITY): Payer: Medicare Other

## 2016-03-01 DIAGNOSIS — Z79899 Other long term (current) drug therapy: Secondary | ICD-10-CM

## 2016-03-01 DIAGNOSIS — Z8249 Family history of ischemic heart disease and other diseases of the circulatory system: Secondary | ICD-10-CM

## 2016-03-01 DIAGNOSIS — J449 Chronic obstructive pulmonary disease, unspecified: Secondary | ICD-10-CM

## 2016-03-01 DIAGNOSIS — Z9081 Acquired absence of spleen: Secondary | ICD-10-CM

## 2016-03-01 DIAGNOSIS — R404 Transient alteration of awareness: Secondary | ICD-10-CM | POA: Diagnosis not present

## 2016-03-01 DIAGNOSIS — G9349 Other encephalopathy: Secondary | ICD-10-CM | POA: Diagnosis present

## 2016-03-01 DIAGNOSIS — I132 Hypertensive heart and chronic kidney disease with heart failure and with stage 5 chronic kidney disease, or end stage renal disease: Secondary | ICD-10-CM | POA: Diagnosis present

## 2016-03-01 DIAGNOSIS — I639 Cerebral infarction, unspecified: Secondary | ICD-10-CM | POA: Diagnosis present

## 2016-03-01 DIAGNOSIS — Z992 Dependence on renal dialysis: Secondary | ICD-10-CM

## 2016-03-01 DIAGNOSIS — R4182 Altered mental status, unspecified: Secondary | ICD-10-CM

## 2016-03-01 DIAGNOSIS — I4510 Unspecified right bundle-branch block: Secondary | ICD-10-CM | POA: Diagnosis present

## 2016-03-01 DIAGNOSIS — M069 Rheumatoid arthritis, unspecified: Secondary | ICD-10-CM | POA: Diagnosis present

## 2016-03-01 DIAGNOSIS — Z9181 History of falling: Secondary | ICD-10-CM | POA: Diagnosis not present

## 2016-03-01 DIAGNOSIS — Z803 Family history of malignant neoplasm of breast: Secondary | ICD-10-CM

## 2016-03-01 DIAGNOSIS — R569 Unspecified convulsions: Secondary | ICD-10-CM | POA: Diagnosis not present

## 2016-03-01 DIAGNOSIS — J441 Chronic obstructive pulmonary disease with (acute) exacerbation: Secondary | ICD-10-CM | POA: Diagnosis not present

## 2016-03-01 DIAGNOSIS — Z880 Allergy status to penicillin: Secondary | ICD-10-CM

## 2016-03-01 DIAGNOSIS — Z881 Allergy status to other antibiotic agents status: Secondary | ICD-10-CM

## 2016-03-01 DIAGNOSIS — J439 Emphysema, unspecified: Secondary | ICD-10-CM | POA: Diagnosis present

## 2016-03-01 DIAGNOSIS — G40209 Localization-related (focal) (partial) symptomatic epilepsy and epileptic syndromes with complex partial seizures, not intractable, without status epilepticus: Secondary | ICD-10-CM | POA: Diagnosis not present

## 2016-03-01 DIAGNOSIS — Z7982 Long term (current) use of aspirin: Secondary | ICD-10-CM

## 2016-03-01 DIAGNOSIS — R0602 Shortness of breath: Secondary | ICD-10-CM

## 2016-03-01 DIAGNOSIS — Z7951 Long term (current) use of inhaled steroids: Secondary | ICD-10-CM

## 2016-03-01 DIAGNOSIS — Z91030 Bee allergy status: Secondary | ICD-10-CM

## 2016-03-01 DIAGNOSIS — I5022 Chronic systolic (congestive) heart failure: Secondary | ICD-10-CM | POA: Diagnosis present

## 2016-03-01 DIAGNOSIS — Z853 Personal history of malignant neoplasm of breast: Secondary | ICD-10-CM

## 2016-03-01 DIAGNOSIS — R6889 Other general symptoms and signs: Secondary | ICD-10-CM | POA: Diagnosis not present

## 2016-03-01 DIAGNOSIS — Z888 Allergy status to other drugs, medicaments and biological substances status: Secondary | ICD-10-CM

## 2016-03-01 DIAGNOSIS — Z96642 Presence of left artificial hip joint: Secondary | ICD-10-CM | POA: Diagnosis present

## 2016-03-01 DIAGNOSIS — G936 Cerebral edema: Secondary | ICD-10-CM | POA: Diagnosis present

## 2016-03-01 DIAGNOSIS — N186 End stage renal disease: Secondary | ICD-10-CM | POA: Diagnosis not present

## 2016-03-01 DIAGNOSIS — Z8673 Personal history of transient ischemic attack (TIA), and cerebral infarction without residual deficits: Secondary | ICD-10-CM

## 2016-03-01 DIAGNOSIS — R531 Weakness: Secondary | ICD-10-CM | POA: Diagnosis not present

## 2016-03-01 DIAGNOSIS — G9340 Encephalopathy, unspecified: Secondary | ICD-10-CM | POA: Diagnosis not present

## 2016-03-01 DIAGNOSIS — F419 Anxiety disorder, unspecified: Secondary | ICD-10-CM | POA: Diagnosis present

## 2016-03-01 DIAGNOSIS — K219 Gastro-esophageal reflux disease without esophagitis: Secondary | ICD-10-CM | POA: Diagnosis present

## 2016-03-01 DIAGNOSIS — Z87891 Personal history of nicotine dependence: Secondary | ICD-10-CM

## 2016-03-01 DIAGNOSIS — Z66 Do not resuscitate: Secondary | ICD-10-CM | POA: Diagnosis present

## 2016-03-01 DIAGNOSIS — I509 Heart failure, unspecified: Secondary | ICD-10-CM | POA: Diagnosis not present

## 2016-03-01 DIAGNOSIS — Z885 Allergy status to narcotic agent status: Secondary | ICD-10-CM

## 2016-03-01 DIAGNOSIS — R4701 Aphasia: Secondary | ICD-10-CM | POA: Diagnosis not present

## 2016-03-01 DIAGNOSIS — F17200 Nicotine dependence, unspecified, uncomplicated: Secondary | ICD-10-CM | POA: Diagnosis present

## 2016-03-01 DIAGNOSIS — Z9981 Dependence on supplemental oxygen: Secondary | ICD-10-CM | POA: Diagnosis not present

## 2016-03-01 DIAGNOSIS — J9691 Respiratory failure, unspecified with hypoxia: Secondary | ICD-10-CM | POA: Diagnosis not present

## 2016-03-01 DIAGNOSIS — Z515 Encounter for palliative care: Secondary | ICD-10-CM | POA: Diagnosis present

## 2016-03-01 DIAGNOSIS — Z882 Allergy status to sulfonamides status: Secondary | ICD-10-CM

## 2016-03-01 LAB — DIFFERENTIAL
Basophils Absolute: 0 10*3/uL (ref 0.0–0.1)
Basophils Relative: 0 %
EOS PCT: 5 %
Eosinophils Absolute: 0.5 10*3/uL (ref 0.0–0.7)
LYMPHS PCT: 22 %
Lymphs Abs: 2.5 10*3/uL (ref 0.7–4.0)
MONO ABS: 1.1 10*3/uL — AB (ref 0.1–1.0)
Monocytes Relative: 10 %
Neutro Abs: 7 10*3/uL (ref 1.7–7.7)
Neutrophils Relative %: 63 %

## 2016-03-01 LAB — COMPREHENSIVE METABOLIC PANEL
ALK PHOS: 57 U/L (ref 38–126)
ALT: 10 U/L — ABNORMAL LOW (ref 14–54)
ANION GAP: 13 (ref 5–15)
AST: 21 U/L (ref 15–41)
Albumin: 2.5 g/dL — ABNORMAL LOW (ref 3.5–5.0)
BUN: 25 mg/dL — ABNORMAL HIGH (ref 6–20)
CALCIUM: 9.4 mg/dL (ref 8.9–10.3)
CO2: 28 mmol/L (ref 22–32)
Chloride: 98 mmol/L — ABNORMAL LOW (ref 101–111)
Creatinine, Ser: 10.07 mg/dL — ABNORMAL HIGH (ref 0.44–1.00)
GFR, EST AFRICAN AMERICAN: 4 mL/min — AB (ref >60)
GFR, EST NON AFRICAN AMERICAN: 3 mL/min — AB (ref >60)
Glucose, Bld: 105 mg/dL — ABNORMAL HIGH (ref 65–99)
Potassium: 3.6 mmol/L (ref 3.5–5.1)
SODIUM: 139 mmol/L (ref 135–145)
TOTAL PROTEIN: 6 g/dL — AB (ref 6.5–8.1)
Total Bilirubin: 0.4 mg/dL (ref 0.3–1.2)

## 2016-03-01 LAB — I-STAT CHEM 8, ED
BUN: 28 mg/dL — ABNORMAL HIGH (ref 6–20)
CALCIUM ION: 1.11 mmol/L — AB (ref 1.15–1.40)
Chloride: 97 mmol/L — ABNORMAL LOW (ref 101–111)
Creatinine, Ser: 9.8 mg/dL — ABNORMAL HIGH (ref 0.44–1.00)
Glucose, Bld: 101 mg/dL — ABNORMAL HIGH (ref 65–99)
HCT: 27 % — ABNORMAL LOW (ref 36.0–46.0)
Hemoglobin: 9.2 g/dL — ABNORMAL LOW (ref 12.0–15.0)
Potassium: 3.6 mmol/L (ref 3.5–5.1)
SODIUM: 138 mmol/L (ref 135–145)
TCO2: 30 mmol/L (ref 0–100)

## 2016-03-01 LAB — CBC
HEMATOCRIT: 29.2 % — AB (ref 36.0–46.0)
Hemoglobin: 9.3 g/dL — ABNORMAL LOW (ref 12.0–15.0)
MCH: 30.6 pg (ref 26.0–34.0)
MCHC: 31.8 g/dL (ref 30.0–36.0)
MCV: 96.1 fL (ref 78.0–100.0)
PLATELETS: 363 10*3/uL (ref 150–400)
RBC: 3.04 MIL/uL — AB (ref 3.87–5.11)
RDW: 13.3 % (ref 11.5–15.5)
WBC: 11.1 10*3/uL — ABNORMAL HIGH (ref 4.0–10.5)

## 2016-03-01 LAB — PROTIME-INR
INR: 0.88
PROTHROMBIN TIME: 11.9 s (ref 11.4–15.2)

## 2016-03-01 LAB — AMMONIA: Ammonia: 19 umol/L (ref 9–35)

## 2016-03-01 LAB — APTT: aPTT: 34 seconds (ref 24–36)

## 2016-03-01 LAB — I-STAT TROPONIN, ED: TROPONIN I, POC: 0.07 ng/mL (ref 0.00–0.08)

## 2016-03-01 LAB — CBG MONITORING, ED: GLUCOSE-CAPILLARY: 104 mg/dL — AB (ref 65–99)

## 2016-03-01 LAB — VITAMIN B12: VITAMIN B 12: 504 pg/mL (ref 180–914)

## 2016-03-01 LAB — ETHANOL

## 2016-03-01 MED ORDER — HEPARIN SODIUM (PORCINE) 5000 UNIT/ML IJ SOLN
5000.0000 [IU] | Freq: Three times a day (TID) | INTRAMUSCULAR | Status: DC
  Administered 2016-03-01 – 2016-03-03 (×6): 5000 [IU] via SUBCUTANEOUS
  Filled 2016-03-01 (×6): qty 1

## 2016-03-01 MED ORDER — DOCUSATE SODIUM 100 MG PO CAPS: 100.0000 mg | ORAL_CAPSULE | Freq: Every evening | ORAL | Status: DC

## 2016-03-01 MED ORDER — PANTOPRAZOLE SODIUM 40 MG PO TBEC: 40.0000 mg | DELAYED_RELEASE_TABLET | Freq: Every day | ORAL | Status: DC

## 2016-03-01 MED ORDER — LORAZEPAM 2 MG/ML IJ SOLN: 2.0000 mg | INTRAMUSCULAR | Status: DC | PRN

## 2016-03-01 MED ORDER — LORAZEPAM 2 MG/ML IJ SOLN: 1.0000 mg | INTRAMUSCULAR | Status: DC | PRN

## 2016-03-01 MED ORDER — SODIUM CHLORIDE 0.9 % IV SOLN: INTRAVENOUS | Status: DC

## 2016-03-01 MED ORDER — SENNOSIDES-DOCUSATE SODIUM 8.6-50 MG PO TABS: 1.0000 | ORAL_TABLET | Freq: Every evening | ORAL | Status: DC | PRN

## 2016-03-01 MED ORDER — ROSUVASTATIN CALCIUM 5 MG PO TABS
10.0000 mg | ORAL_TABLET | Freq: Every day | ORAL | Status: DC
  Administered 2016-03-01 – 2016-03-02 (×2): 10 mg via ORAL
  Filled 2016-03-01 (×2): qty 2

## 2016-03-01 MED ORDER — HEPARIN 1000 UNIT/ML FOR PERITONEAL DIALYSIS
2500.0000 [IU] | INTRAMUSCULAR | Status: DC | PRN
  Filled 2016-03-01: qty 2.5

## 2016-03-01 MED ORDER — ONDANSETRON HCL 4 MG PO TABS: 4.0000 mg | ORAL_TABLET | Freq: Four times a day (QID) | ORAL | Status: DC | PRN

## 2016-03-01 MED ORDER — ACETAMINOPHEN 650 MG RE SUPP: 650.0000 mg | Freq: Four times a day (QID) | RECTAL | Status: DC | PRN

## 2016-03-01 MED ORDER — ONDANSETRON HCL 4 MG/2ML IJ SOLN: 4.0000 mg | Freq: Four times a day (QID) | INTRAMUSCULAR | Status: DC | PRN

## 2016-03-01 MED ORDER — DELFLEX-LC/1.5% DEXTROSE 344 MOSM/L IP SOLN: 2000.0000 mL | Freq: Every day | INTRAPERITONEAL | Status: DC

## 2016-03-01 MED ORDER — SENNOSIDES-DOCUSATE SODIUM 8.6-50 MG PO TABS
1.0000 | ORAL_TABLET | Freq: Every day | ORAL | Status: DC
  Administered 2016-03-02: 1 via ORAL
  Filled 2016-03-01 (×2): qty 1

## 2016-03-01 MED ORDER — HEPARIN SODIUM (PORCINE) 5000 UNIT/ML IJ SOLN: 5000.0000 [IU] | Freq: Three times a day (TID) | INTRAMUSCULAR | Status: DC

## 2016-03-01 MED ORDER — ASPIRIN EC 81 MG PO TBEC
81.0000 mg | DELAYED_RELEASE_TABLET | Freq: Every evening | ORAL | Status: DC
Start: 2016-03-01 — End: 2016-03-03
  Administered 2016-03-01 – 2016-03-02 (×2): 81 mg via ORAL
  Filled 2016-03-01 (×2): qty 1

## 2016-03-01 MED ORDER — GENTAMICIN SULFATE 0.1 % EX CREA
1.0000 "application " | TOPICAL_CREAM | Freq: Every day | CUTANEOUS | Status: DC
  Administered 2016-03-01 – 2016-03-03 (×3): 1 via TOPICAL
  Filled 2016-03-01: qty 15

## 2016-03-01 MED ORDER — DELFLEX-LC/1.5% DEXTROSE 344 MOSM/L IP SOLN
13.0000 L | INTRAPERITONEAL | Status: DC
  Administered 2016-03-01 – 2016-03-02 (×3): 13 L via INTRAPERITONEAL

## 2016-03-01 MED ORDER — DELFLEX-LC/1.5% DEXTROSE 344 MOSM/L IP SOLN: 3.0000 L | INTRAPERITONEAL | Status: DC

## 2016-03-01 MED ORDER — HEPARIN 1000 UNIT/ML FOR PERITONEAL DIALYSIS
1500.0000 [IU] | INTRAMUSCULAR | Status: DC | PRN
  Filled 2016-03-01 (×2): qty 1.5

## 2016-03-01 MED ORDER — SODIUM CHLORIDE 0.9% FLUSH
3.0000 mL | Freq: Two times a day (BID) | INTRAVENOUS | Status: DC
Start: 2016-03-01 — End: 2016-03-03
  Administered 2016-03-01 – 2016-03-03 (×4): 3 mL via INTRAVENOUS

## 2016-03-01 MED ORDER — MOMETASONE FURO-FORMOTEROL FUM 200-5 MCG/ACT IN AERO
2.0000 | INHALATION_SPRAY | Freq: Two times a day (BID) | RESPIRATORY_TRACT | Status: DC
  Administered 2016-03-02 – 2016-03-03 (×3): 2 via RESPIRATORY_TRACT
  Filled 2016-03-01: qty 8.8

## 2016-03-01 MED ORDER — TIOTROPIUM BROMIDE MONOHYDRATE 18 MCG IN CAPS
18.0000 ug | ORAL_CAPSULE | Freq: Every day | RESPIRATORY_TRACT | Status: DC
  Administered 2016-03-02: 18 ug via RESPIRATORY_TRACT
  Filled 2016-03-01: qty 5

## 2016-03-01 MED ORDER — ACETAMINOPHEN 325 MG PO TABS: 650.0000 mg | ORAL_TABLET | Freq: Four times a day (QID) | ORAL | Status: DC | PRN

## 2016-03-01 MED ORDER — PANTOPRAZOLE SODIUM 40 MG PO TBEC
40.0000 mg | DELAYED_RELEASE_TABLET | Freq: Every day | ORAL | Status: DC
  Administered 2016-03-02 – 2016-03-03 (×2): 40 mg via ORAL
  Filled 2016-03-01 (×2): qty 1

## 2016-03-01 NOTE — Consult Note (Addendum)
Referring Physician: Dr. Johnney Killian    Chief Complaint: Sudden onset of speech arrest followed by confusion and garbled speech  HPI: Lydia Clark is an 75 y.o. female who presents to the ED after family witnessed sudden onset of speech arrest followed by confusion and garbled speech at home this morning. She was last seen normal at 10:00 AM. She has a history of prior stroke.   CT performed in the ED showed no acute findings. Relative to the prior CT scan, new chronic infarctions were seen in the right posterior cerebrum, left occipital cortex, and left superior cerebellum. Also noted was interval appearance of a low and medial left thalamus infarct.  Following Neurological examination, family communicated to staff that the patient exhibited seizure-like symptoms at onset of the episode of AMS and speech arrest at home, turning head to one side with gaze deviation and stereotyped chewing motions that rapidly resolved. She was confused afterwards.   LSN: 10:00 AM tPA Given: No, due to exam findings being more consistent with confusion than aphasia, indicating toxic/metabolic or seizure as a more likely etiology, as well as rapid improvement. No lateralized weakness, neglect or sensory deficit noted.   Past Medical History:  Diagnosis Date  . Allergy   . Anxiety    anxiety  . Arthritis   . Breast screening, unspecified 2013  . Cancer [facility]) 2008   left breast  . CHF (congestive heart failure) ([place])   . Chronic kidney disease 2014   stage IV chronic   . COPD (chronic obstructive pulmonary disease) ([place])   . Dialysis patient ([place]) 2014  . Endocrine disorder 2008   thyroid  . GERD (gastroesophageal reflux disease)   . Heart murmur    child  . History of kidney stones   . Hypertension 2006  . Multinodular goiter    followed by Dr. Carloyn Manner @ [facility]  . PE (pulmonary embolism)    patient denies  . Personal history of malignant neoplasm of breast 2008  . Personal history of  tobacco use, presenting hazards to health 2012   30 yrs smoking  . Pneumonia    hx  . Special screening for malignant neoplasms, colon 2013  . Stroke ([place])   . Swelling of throat    swelling at base of throat,    Past Surgical History:  Procedure Laterality Date  . APPENDECTOMY  2005  . BREAST BIOPSY  2005  . BREAST SURGERY Left 2008   lumpectomy  . COLONOSCOPY  2007   done in [place]  . FOOT SURGERY  2005  . fooy Left    lft foot  . HIP ARTHROPLASTY Left 01/10/2015   Procedure: ARTHROPLASTY BIPOLAR HIP (HEMIARTHROPLASTY);  Surgeon: Claud Kelp, MD;  Location: [facility];  Service: Orthopedics;  Laterality: Left;  . INSERTION OF DIALYSIS CATHETER Right 07/02/2012   Procedure: INSERTION OF DIALYSIS CATHETER;  Surgeon: Elam Dutch, MD;  Location: [place];  Service: Vascular;  Laterality: Right;  Right Internal Jugular  . INSERTION OF DIALYSIS CATHETER Right July 02 2012   right chest/ temporary cath  . PERIPHERAL VASCULAR CATHETERIZATION N/A 01/13/2015   Procedure: Dialysis/Perma Catheter Insertion;  Surgeon: Algernon Huxley, MD;  Location: [place] [facility];  Service: Cardiovascular;  Laterality: N/A;  . REPLACEMENT TOTAL KNEE Left 2012   Partial   . SPLENECTOMY, TOTAL  1956   not sure if partial or total  . TONSILLECTOMY  1950    Family History  Problem Relation Age  of Onset  . Heart attack Father   . Pneumonia Father   . Breast cancer Maternal Aunt   . Breast cancer Paternal Aunt    Social History:  reports that she quit smoking about 13 months ago. Her smoking use included Cigarettes. She has a 9.00 pack-year smoking history. She has never used smokeless tobacco. She reports that she does not drink alcohol or use drugs.  Allergies:  Allergies  Allergen Reactions  . Bee Venom Swelling and Other (See Comments)    Breathing problems  . Biaxin [Clarithromycin] Other (See Comments)    Throat swells  . Hydrocodone Nausea And Vomiting  . Mycostatin [Nystatin] Other  (See Comments)    Blisters from the ointment  . Polysorbate     Other reaction(s): Unknown  . Sulfa Antibiotics Other (See Comments)    "welps"  . Penicillins Rash    Medications:  Current Meds  Medication Sig  . amitriptyline (ELAVIL) 25 MG tablet Take 25 mg by mouth at bedtime.  Marland Kitchen aspirin 81 MG tablet Take 81 mg by mouth every evening.   . calcium acetate (PHOSLO) 667 MG capsule Take 667 mg by mouth 3 (three) times daily with meals.  . docusate sodium (COLACE) 100 MG capsule Take 100 mg by mouth every evening.  . Fluticasone-Salmeterol (ADVAIR DISKUS) 250-50 MCG/DOSE AEPB Inhale 1 puff into the lungs every 12 (twelve) hours.  Marland Kitchen omeprazole (PRILOSEC) 20 MG capsule Take 20 mg by mouth daily.  Marland Kitchen PRESCRIPTION MEDICATION Apply 1 application topically at bedtime as needed (to dialysis site). cream  . rosuvastatin (CRESTOR) 10 MG tablet Take 10 mg by mouth at bedtime.   . sennosides-docusate sodium (SENOKOT-S) 8.6-50 MG tablet Take 1 tablet by mouth daily.  Marland Kitchen tiotropium (SPIRIVA) 18 MCG inhalation capsule Place 1 capsule (18 mcg total) into inhaler and inhale at bedtime.    ROS: Unreliable due to confusion at time of exam.   Physical Examination: There were no vitals taken for this visit.  General Exam: [place]/AT. Noncyanotic, anicteric. Limbs warm and well perfused. Petechiae and a healing abrasion noted on examination of forearms.   Neurologic Examination: Ment: Awake with mildly decreased alertness. No dysarthria. Confused. Speech is fragmentary at times, fluent at others and improves significantly during the course of the examination. A few phonemic paraphasic errors are noted. Follows 90% of simple motor commands. Has difficulty answering questions about her symptoms. Verbal and motor perseveration was noted earlier in the course of the exam. Anxious affect early in exam substantially improved later in exam.  CN: PERRL, visual fields intact to threat and gross confrontation testing.  EOMI without nystagmus. Facial sensation equal bilaterally. Face symmetric. No hypophonia noted. XI - symmetric. Tongue midline.  Motor: 5/5 x 4.  Sensory: Reacted equally to noxious in all 4 extremities during early portion of exam.  Reflexes: Mild diffuse hyperreflexia with downgoing toes.  Cerebellar: No ataxia with FNF bilaterally.  Gait: Deferred.   No results found for this or any previous visit (from the past 48 hour(s)). No results found.  Assessment: 74 y.o. female with acute onset of symptoms that are most consistent with seizure, followed by AMS more consistent with diffuse cerebral dysfunction in postictal state rather than aphasia due to a focal cerebral lesion.  1. Benefits of tPA are outweighed by risks given that seizure is significantly more likely than stroke, in addition to rapid recovery.  2. Benefits of CTA are outweighed by risks given severely impaired renal function.   3. 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  4. Severely elevated Cr of 9.8. Listed as a dialysis patient in History tab.  5. Prior strokes seen on CT head.   Plan: 1. EEG.  2. MRI of brain without contrast.  3. MRA of head, carotid ultrasound, TTE.  4. Continue ASA and Crestor.  5. Speech consult 6. Frequent neuro checks. 7. Start Vimpat 100 mg po or IV BID. 8. TSH level.    @Electronically  signed: Dr. Kerney Elbe  03/01/2016, 11:08 AM

## 2016-03-01 NOTE — Progress Notes (Signed)
Md in pt room speaking with family about plan of care.

## 2016-03-01 NOTE — Progress Notes (Signed)
   Family updated on MRI findings and plan of care.  EEG results pending.   Linna Darner, MD [facility] 03/01/2016, 8:18 PM

## 2016-03-01 NOTE — Telephone Encounter (Signed)
Letter mailed to pt.  

## 2016-03-01 NOTE — Progress Notes (Signed)
Pt on telemetry monitoring.

## 2016-03-01 NOTE — ED Notes (Addendum)
Spoke with RN Stevan Born on 41M and she stated ok to take pt up to 41M from MRI.  Called MRI to notify them pt needs to be brought up to 41M from MRI.  RN is aware that stroke swallow screen was not completed because pt was taken to MRI before screen was completed.

## 2016-03-01 NOTE — Progress Notes (Signed)
Spoke with PD nurse from [place] who stated that she is currently outpatient made several attempts to contact Md but was not successful.  She will not be able to do peritoneal dialysis for pt. Contacted Nikki on [place] who spoke with son. He gave instructions to [facility] for peritoneal dialysis.

## 2016-03-01 NOTE — Progress Notes (Signed)
Pt family had concerns about not speaking with Md in ED before pt came to unit. Spoke to Md who stated that he could come speak with family.

## 2016-03-01 NOTE — ED Provider Notes (Signed)
[facility] DEPT Provider Note   CSN: [id number] Arrival date & time: 03/01/16  1057     History   Chief Complaint Chief Complaint  Patient presents with  . Code Stroke    Altered Mental Status    HPI Lydia Clark is a 75 y.o. female.  HPI Patient had an acute episode of mental status change at 10:00 today. Family members report that she was being taken care of by a home health nurse. She does however live with family who is closely involved with her care and was home at the time. The patient had an episode of turning her head to the left and making chewing motions without responding to them. They report she seemed confused. They thought she had a piece of bacon in her mouth and she would not allow them to take it out. She did come out of this episode for a couple of minutes and was somewhat more oriented but then repeated the same activity of head deviation to the left and chewing motions. After that had resolved again she seemed slightly hostile and agitated but did not have focal other deficit and they could identify in her extremities. This is completely atypical for the patient who has normally completely clear mental status. Patient does get peritoneal dialysis at home. She was dialyzed overnight last night as per usual. Patient has no acute complaints. She is denying headache, chest pain or other recent illness. Family members report that before this episode she seemed to be at baseline function and health. Past Medical History:  Diagnosis Date  . Allergy   . Anxiety    anxiety  . Arthritis   . Breast screening, unspecified 2013  . Cancer [facility]) 2008   left breast  . CHF (congestive heart failure) ([place])   . Chronic kidney disease 2014   stage IV chronic   . COPD (chronic obstructive pulmonary disease) ([place])   . Dialysis patient ([place]) 2014  . Endocrine disorder 2008   thyroid  . GERD (gastroesophageal reflux disease)   . Heart murmur    child  . History of kidney  stones   . Hypertension 2006  . Multinodular goiter    followed by Dr. Carloyn Manner @ [facility]  . PE (pulmonary embolism)    patient denies  . Personal history of malignant neoplasm of breast 2008  . Personal history of tobacco use, presenting hazards to health 2012   30 yrs smoking  . Pneumonia    hx  . Special screening for malignant neoplasms, colon 2013  . Stroke ([place])   . Swelling of throat    swelling at base of throat,    Patient Active Problem List   Diagnosis Date Noted  . New onset seizure ([place]) 03/01/2016  . Hypotension 08/09/2015  . Chronic systolic heart failure ([place]) 04/02/2015  . 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 Left hip pain 04/02/2015  . Femoral neck fracture, left, closed, initial encounter 01/09/2015  . COPD, mild ([place]) 04/01/2014  . Tobacco use disorder 04/01/2014  . History of breast cancer, left Stage 1 invasive, ER/PR p[os, Her 2 neg 07/23/2012    Past Surgical History:  Procedure Laterality Date  . APPENDECTOMY  2005  . BREAST BIOPSY  2005  . BREAST SURGERY Left 2008   lumpectomy  . COLONOSCOPY  2007   done in [place]  . FOOT SURGERY  2005  . fooy Left    lft foot  .  HIP ARTHROPLASTY Left 01/10/2015   Procedure: ARTHROPLASTY BIPOLAR HIP (HEMIARTHROPLASTY);  Surgeon: Claud Kelp, MD;  Location: [facility] ORS;  Service: Orthopedics;  Laterality: Left;  . INSERTION OF DIALYSIS CATHETER Right 07/02/2012   Procedure: INSERTION OF DIALYSIS CATHETER;  Surgeon: Elam Dutch, MD;  Location: [facility];  Service: Vascular;  Laterality: Right;  Right Internal Jugular  . INSERTION OF DIALYSIS CATHETER Right July 02 2012   right chest/ temporary cath  . PERIPHERAL VASCULAR CATHETERIZATION N/A 01/13/2015   Procedure: Dialysis/Perma Catheter Insertion;  Surgeon: Algernon Huxley, MD;  Location: [facility];  Service: Cardiovascular;  Laterality: N/A;  . REPLACEMENT TOTAL KNEE Left 2012   Partial     . SPLENECTOMY, TOTAL  1956   not sure if partial or total  . TONSILLECTOMY  1950    OB History    Gravida Para Term Preterm AB Living   3       1 2    SAB TAB Ectopic Multiple Live Births   1              Obstetric Comments   Age first pregnancy-23 Age first menstruation-15 Age last period-54       Home Medications    Prior to Admission medications   Medication Sig Start Date End Date Taking? Authorizing Provider  amitriptyline (ELAVIL) 25 MG tablet Take 25 mg by mouth at bedtime.   Yes Historical Provider, MD  aspirin 81 MG tablet Take 81 mg by mouth every evening.    Yes Historical Provider, MD  calcium acetate (PHOSLO) 667 MG capsule Take 667 mg by mouth 3 (three) times daily with meals.   Yes Historical Provider, MD  docusate sodium (COLACE) 100 MG capsule Take 100 mg by mouth every evening.   Yes Historical Provider, MD  Fluticasone-Salmeterol (ADVAIR DISKUS) 250-50 MCG/DOSE AEPB Inhale 1 puff into the lungs every 12 (twelve) hours. 03/03/15 03/02/16 Yes Laverle Hobby, MD  omeprazole (PRILOSEC) 20 MG capsule Take 20 mg by mouth daily.   Yes Historical Provider, MD  PRESCRIPTION MEDICATION Apply 1 application topically at bedtime as needed (to dialysis site). cream   Yes Historical Provider, MD  rosuvastatin (CRESTOR) 10 MG tablet Take 10 mg by mouth at bedtime.    Yes Historical Provider, MD  sennosides-docusate sodium (SENOKOT-S) 8.6-50 MG tablet Take 1 tablet by mouth daily.   Yes Historical Provider, MD  tiotropium (SPIRIVA) 18 MCG inhalation capsule Place 1 capsule (18 mcg total) into inhaler and inhale at bedtime. 03/03/15  Yes Laverle Hobby, MD    Family History Family History  Problem Relation Age of Onset  . Heart attack Father   . Pneumonia Father   . Breast cancer Maternal Aunt   . Breast cancer Paternal Aunt     Social History Social History  Substance Use Topics  . Smoking status: Former Smoker    Packs/day: 0.30    Years: 30.00     Types: Cigarettes    Quit date: 01/10/2015  . Smokeless tobacco: Never Used  . Alcohol use No     Allergies   Bee venom; Biaxin [clarithromycin]; Hydrocodone; Mycostatin [nystatin]; Polysorbate; Sulfa antibiotics; and Penicillins   Review of Systems Review of Systems 10 Systems reviewed and are negative for acute change except as noted in the HPI.   Physical Exam Updated Vital Signs BP 143/77   Pulse 72   Temp 97.7 F (36.5 C) (Rectal)   Resp 16   Ht 5\' 8"  (1.727 m)  Wt 132 lb 4.4 oz (60 kg)   SpO2 95%   BMI 20.11 kg/m   Physical Exam  Constitutional: She is oriented to person, place, and time. She appears well-developed and well-nourished. No distress.  HENT:  Head: Normocephalic and atraumatic.  Right Ear: External ear normal.  Left Ear: External ear normal.  Nose: Nose normal.  Mouth/Throat: Oropharynx is clear and moist.  Eyes: Conjunctivae and EOM are normal. Pupils are equal, round, and reactive to light.  Neck: Neck supple.  Cardiovascular: Normal rate and regular rhythm.   No murmur heard. Pulmonary/Chest: Effort normal and breath sounds normal. No respiratory distress.  Abdominal: Soft. There is no tenderness.  Patient has PD catheter is in her abdomen. Abdomen soft nontender.  Musculoskeletal: She exhibits no edema, tenderness or deformity.  Neurological: She is alert and oriented to person, place, and time. No cranial nerve deficit or sensory deficit. She exhibits normal muscle tone. Coordination normal.  Skin: Skin is warm and dry.  Patient has a large scaling plaque on her left forearm. This is approximately 10 x 5 cm and irregular. It has brown and yellow skin changes.  Psychiatric: She has a normal mood and affect.  Nursing note and vitals reviewed.    ED Treatments / Results  Labs (all labs ordered are listed, but only abnormal results are displayed) Labs Reviewed  CBC - Abnormal; Notable for the following:       Result Value   WBC 11.1 (*)      RBC 3.04 (*)    Hemoglobin 9.3 (*)    HCT 29.2 (*)    All other components within normal limits  DIFFERENTIAL - Abnormal; Notable for the following:    Monocytes Absolute 1.1 (*)    All other components within normal limits  COMPREHENSIVE METABOLIC PANEL - Abnormal; Notable for the following:    Chloride 98 (*)    Glucose, Bld 105 (*)    BUN 25 (*)    Creatinine, Ser 10.07 (*)    Total Protein 6.0 (*)    Albumin 2.5 (*)    ALT 10 (*)    GFR calc non Af Amer 3 (*)    GFR calc Af Amer 4 (*)    All other components within normal limits  I-STAT CHEM 8, ED - Abnormal; Notable for the following:    Chloride 97 (*)    BUN 28 (*)    Creatinine, Ser 9.80 (*)    Glucose, Bld 101 (*)    Calcium, Ion 1.11 (*)    Hemoglobin 9.2 (*)    HCT 27.0 (*)    All other components within normal limits  CBG MONITORING, ED - Abnormal; Notable for the following:    Glucose-Capillary 104 (*)    All other components within normal limits  ETHANOL  PROTIME-INR  APTT  RAPID URINE DRUG SCREEN, HOSP PERFORMED  URINALYSIS, ROUTINE W REFLEX MICROSCOPIC  I-STAT TROPOININ, ED    EKG  EKG Interpretation  Date/Time:  Wednesday March 01 2016 12:02:26 EST Ventricular Rate:  72 PR Interval:    QRS Duration: 132 QT Interval:  445 QTC Calculation: 487 R Axis:   -81 Text Interpretation:  Sinus rhythm Right bundle branch block Inferior infarct, old agree. no STEMI. old RBBB Confirmed by Johnney Killian, MD, Jeannie Done [phone]) on 03/01/2016 1:51:28 PM       Radiology Ct Head Code Stroke W/o Cm  Result Date: 03/01/2016 CLINICAL DATA:  Code stroke.  Altered mental status. EXAM: CT HEAD WITHOUT  CONTRAST TECHNIQUE: Contiguous axial images were obtained from the base of the skull through the vertex without intravenous contrast. COMPARISON:  01/09/2015 FINDINGS: Brain: Interval infarcts in the right parietal region, left inferior occipital lobe, and left superior cerebellum. The right cerebral infarct is large. These  areas have a well-defined low-density appearance, with volume loss seen the level of the right lateral ventricle. Small vessel infarct seen in the low left thalamus. Subtle high-density asymmetrically in the left caudate head is likely mineralization. No associated swelling to suggest acute hemorrhage. No acute hemorrhage seen throughout the scan. Chronic microvascular ischemic change in the periventricular white matter. Atrophy with ventriculomegaly. Vascular: No hyperdense vessel.  Atherosclerotic calcification. Skull: No acute finding Other: Results paged at the time of interpretation on 03/01/2016 at 11:21 am to Dr. Cheral Marker, with verbal confirmation. ASPECTS [facility] Stroke Program Early CT Score) Not performed in the setting of chronic infarcts and uncertain localizing symptom. IMPRESSION: 1. No acute hemorrhage or large vessel territory infarct. 2. Interval but chronic appearing infarcts in the right posterior cerebrum, left occipital cortex, and left superior cerebellum. The right parietal infarct is large volume and could obscure peri-infarct ischemia. 3. Interval low and medial left thalamus infarct. Electronically Signed   By: Monte Fantasia M.D.   On: 03/01/2016 11:27    Procedures Procedures (including critical care time)  Medications Ordered in ED Medications - No data to display   Initial Impression / Assessment and Plan / ED Course  I have reviewed the triage vital signs and the nursing notes.  Pertinent labs & imaging results that were available during my care of the patient were reviewed by me and considered in my medical decision making (see chart for details).  Clinical Course   consult: Dr. Cheral Marker has evaluated the patient for code stroke. Code stroke was discontinued. Have also reviewed the patient's history suggestive of seizure. Proceed with MRI and EEG.  Consult: [facility]ist group for admission.   Final Clinical Impressions(s) / ED Diagnoses   Final diagnoses:    Transient alteration of awareness  New onset seizure [facility])    New Prescriptions New Prescriptions   No medications on file     Charlesetta Shanks, MD 03/01/16 1352

## 2016-03-01 NOTE — Progress Notes (Signed)
EEG completed, results pending. 

## 2016-03-01 NOTE — H&P (Signed)
History and Physical    [name] [id number] DOB: 11-10-1940 DOA: 03/01/2016  PCP: Lavonia Dana, MD Patient coming from: home  Chief Complaint: ams/aphasia  HPI: Lydia Clark is a 75 y.o. female with medical history significant for CHF, chronic kidney disease, COPD, GERD, hypertension, recent stroke presents to the emergency Department chief complaint altered mental status and difficulty speaking. Sleep patient was code stroke which was discontinued after evaluation by neurology. Being admitted for observation as symptoms have improved. Question seizure versus stroke  Information is obtained from the family is at the bedside and the patient noting that information from patient may be unreliable secondary to acute encephalopathy. Family reports patient was in her usual state of health eating breakfast this morning and then all of a sudden became "unresponsive" she did not lose consciousness. She just turned her head to the left and was "smacking her lips" staring and not responding to questions and commands directions. This lasted for a few minutes and then she "cleared up". Then a second episode mostly identical to the first occurred again. Once she became responsive they found her to be somewhat irritable but would follow commands and speak. They deny any recent illness nausea vomiting diarrhea. They do report she's had a decreased oral intake since her stroke last month. She uses a cane and has an unsteady gait but they deny any recent falls. His on peritoneal dialysis and had dialysis last night per her usual. Recent complaints of headache dizziness syncope or near-syncope. No complaints of chest pain palpitations shortness of breath lower extremity edema. No recent fever chills travel or sick contacts.    ED Course: In the emergency department she's afebrile hemodynamically stable and not hypoxic. She is evaluated by neurology who canceled code stroke that was initially  called.  Review of Systems: As per HPI otherwise 10 point review of systems negative.   Ambulatory Status: Lives at home with home health and family caregivers uses a cane has an unsteady gait but no recent falls  Past Medical History:  Diagnosis Date  . Allergy   . Anxiety    anxiety  . Arthritis   . Breast screening, unspecified 2013  . Cancer [facility]) 2008   left breast  . CHF (congestive heart failure) ([place])   . Chronic kidney disease 2014   stage IV chronic   . COPD (chronic obstructive pulmonary disease) ([place])   . Dialysis patient ([place]) 2014  . Endocrine disorder 2008   thyroid  . GERD (gastroesophageal reflux disease)   . Heart murmur    child  . History of kidney stones   . Hypertension 2006  . Multinodular goiter    followed by Dr. Carloyn Manner @ [facility]  . PE (pulmonary embolism)    patient denies  . Personal history of malignant neoplasm of breast 2008  . Personal history of tobacco use, presenting hazards to health 2012   30 yrs smoking  . Pneumonia    hx  . Special screening for malignant neoplasms, colon 2013  . Stroke ([place])   . Swelling of throat    swelling at base of throat,    Past Surgical History:  Procedure Laterality Date  . APPENDECTOMY  2005  . BREAST BIOPSY  2005  . BREAST SURGERY Left 2008   lumpectomy  . COLONOSCOPY  2007   done in [place]  . FOOT SURGERY  2005  . fooy Left    lft foot  . HIP ARTHROPLASTY Left 01/10/2015  Procedure: ARTHROPLASTY BIPOLAR HIP (HEMIARTHROPLASTY);  Surgeon: Claud Kelp, MD;  Location: [facility] ORS;  Service: Orthopedics;  Laterality: Left;  . INSERTION OF DIALYSIS CATHETER Right 07/02/2012   Procedure: INSERTION OF DIALYSIS CATHETER;  Surgeon: Elam Dutch, MD;  Location: [place];  Service: Vascular;  Laterality: Right;  Right Internal Jugular  . INSERTION OF DIALYSIS CATHETER Right July 02 2012   right chest/ temporary cath  . PERIPHERAL VASCULAR CATHETERIZATION N/A 01/13/2015   Procedure:  Dialysis/Perma Catheter Insertion;  Surgeon: Algernon Huxley, MD;  Location: [place] CV [facility];  Service: Cardiovascular;  Laterality: N/A;  . REPLACEMENT TOTAL KNEE Left 2012   Partial   . SPLENECTOMY, TOTAL  1956   not sure if partial or total  . TONSILLECTOMY  1950    Social History   Social History  . Marital status: Widowed    Spouse name: N/A  . Number of children: N/A  . Years of education: N/A   Occupational History  . Not on file.   Social History Main Topics  . Smoking status: Former Smoker    Packs/day: 0.30    Years: 30.00    Types: Cigarettes    Quit date: 01/10/2015  . Smokeless tobacco: Never Used  . Alcohol use No  . Drug use: No  . Sexual activity: Not on file   Other Topics Concern  . Not on file   Social History Narrative  . No narrative on file  Retired childcare giver  Allergies  Allergen Reactions  . Bee Venom Swelling and Other (See Comments)    Breathing problems  . Biaxin [Clarithromycin] Other (See Comments)    Throat swells  . Hydrocodone Nausea And Vomiting  . Mycostatin [Nystatin] Other (See Comments)    Blisters from the ointment  . Polysorbate     Other reaction(s): Unknown  . Sulfa Antibiotics Other (See Comments)    "welps"  . Penicillins Rash    Has patient had a PCN reaction causing immediate rash, faciYesal/tongue/throat swelling, SOB or lightheadedness with hypotension:  Has patient had a PCN reaction causing severe rash involving mucus membranes or skin necrosis: No Has patient had a PCN reaction that required hospitalization No Has patient had a PCN reaction occurring within the last 10 years: No If all of the above answers are "NO", then may proceed with Cephalosporin use.     Family History  Problem Relation Age of Onset  . Heart attack Father   . Pneumonia Father   . Breast cancer Maternal Aunt   . Breast cancer Paternal Aunt     Prior to Admission medications   Medication Sig Start Date End Date Taking?  Authorizing Provider  amitriptyline (ELAVIL) 25 MG tablet Take 25 mg by mouth at bedtime.   Yes Historical Provider, MD  aspirin 81 MG tablet Take 81 mg by mouth every evening.    Yes Historical Provider, MD  calcium acetate (PHOSLO) 667 MG capsule Take 667 mg by mouth 3 (three) times daily with meals.   Yes Historical Provider, MD  docusate sodium (COLACE) 100 MG capsule Take 100 mg by mouth every evening.   Yes Historical Provider, MD  Fluticasone-Salmeterol (ADVAIR DISKUS) 250-50 MCG/DOSE AEPB Inhale 1 puff into the lungs every 12 (twelve) hours. 03/03/15 03/02/16 Yes Laverle Hobby, MD  omeprazole (PRILOSEC) 20 MG capsule Take 20 mg by mouth daily.   Yes Historical Provider, MD  PRESCRIPTION MEDICATION Apply 1 application topically at bedtime as needed (to dialysis site). cream  Yes Historical Provider, MD  rosuvastatin (CRESTOR) 10 MG tablet Take 10 mg by mouth at bedtime.    Yes Historical Provider, MD  sennosides-docusate sodium (SENOKOT-S) 8.6-50 MG tablet Take 1 tablet by mouth daily.   Yes Historical Provider, MD  tiotropium (SPIRIVA) 18 MCG inhalation capsule Place 1 capsule (18 mcg total) into inhaler and inhale at bedtime. 03/03/15  Yes Laverle Hobby, MD    Physical Exam: Vitals:   03/01/16 1130 03/01/16 1138 03/01/16 1144  BP: 143/77    Pulse: 72    Resp:   16  Temp:  97.7 F (36.5 C)   TempSrc:  Rectal   SpO2: 95%    Weight:  60 kg (132 lb 4.4 oz)   Height:  5\' 8"  (1.727 m)      General:  Appears calm and comfortable Slightly thin and frail appearing Eyes:  PERRL, EOMI, normal lids, iris ENT:  grossly normal hearing, lips & tongue, mucous membranes of her mouth are dry slightly pale Neck:  no LAD, masses or thyromegaly Cardiovascular:  RRR, no m/r/g. No LE edema. Pedal pulses present and palpable Respiratory:  CTA bilaterally, no w/r/r. Normal respiratory effort. Abdomen:  soft, ntnd, positive bowel sounds no guarding or rebounding peritoneal catheter in  her abdomen Skin:  no rash or induration seen on limited exam Musculoskeletal:  grossly normal tone BUE/BLE, good ROM, no bony abnormality Psychiatric:  grossly normal mood and affect, speech fluent and appropriate, AOx3 Neurologic:  Alert oriented to self and place only. Able to follow simple commands. Expressive aphasia bilateral grip 4 out of 5 bilateral lower extremity strength 4 out of 5. Midline  Labs on Admission: I have personally reviewed following labs and imaging studies  CBC:  Recent Labs Lab 03/01/16 1101 03/01/16 1108  WBC 11.1*  --   NEUTROABS 7.0  --   HGB 9.3* 9.2*  HCT 29.2* 27.0*  MCV 96.1  --   PLT 363  --    Basic Metabolic Panel:  Recent Labs Lab 03/01/16 1101 03/01/16 1108  NA 139 138  K 3.6 3.6  CL 98* 97*  CO2 28  --   GLUCOSE 105* 101*  BUN 25* 28*  CREATININE 10.07* 9.80*  CALCIUM 9.4  --    GFR: Estimated Creatinine Clearance: 4.8 mL/min (by C-G formula based on SCr of 9.8 mg/dL (H)). Liver Function Tests:  Recent Labs Lab 03/01/16 1101  AST 21  ALT 10*  ALKPHOS 57  BILITOT 0.4  PROT 6.0*  ALBUMIN 2.5*   No results for input(s): LIPASE, AMYLASE in the last 168 hours. No results for input(s): AMMONIA in the last 168 hours. Coagulation Profile:  Recent Labs Lab 03/01/16 1101  INR 0.88   Cardiac Enzymes: No results for input(s): CKTOTAL, CKMB, CKMBINDEX, TROPONINI in the last 168 hours. BNP (last 3 results) No results for input(s): PROBNP in the last 8760 hours. HbA1C: No results for input(s): HGBA1C in the last 72 hours. CBG:  Recent Labs Lab 03/01/16 1131  GLUCAP 104*   Lipid Profile: No results for input(s): CHOL, HDL, LDLCALC, TRIG, CHOLHDL, LDLDIRECT in the last 72 hours. Thyroid Function Tests: No results for input(s): TSH, T4TOTAL, FREET4, T3FREE, THYROIDAB in the last 72 hours. Anemia Panel: No results for input(s): VITAMINB12, FOLATE, FERRITIN, TIBC, IRON, RETICCTPCT in the last 72 hours. Urine  analysis: No results found for: COLORURINE, APPEARANCEUR, LABSPEC, PHURINE, GLUCOSEU, HGBUR, BILIRUBINUR, KETONESUR, PROTEINUR, UROBILINOGEN, NITRITE, LEUKOCYTESUR  Creatinine Clearance: Estimated Creatinine Clearance: 4.8 mL/min (by C-G formula  based on SCr of 9.8 mg/dL (H)).  Sepsis Labs: @LABRCNTIP (procalcitonin:4,lacticidven:4) )No results found for this or any previous visit (from the past 240 hour(s)).   Radiological Exams on Admission: Ct Head Code Stroke W/o Cm  Result Date: 03/01/2016 CLINICAL DATA:  Code stroke.  Altered mental status. EXAM: CT HEAD WITHOUT CONTRAST TECHNIQUE: Contiguous axial images were obtained from the base of the skull through the vertex without intravenous contrast. COMPARISON:  01/09/2015 FINDINGS: Brain: Interval infarcts in the right parietal region, left inferior occipital lobe, and left superior cerebellum. The right cerebral infarct is large. These areas have a well-defined low-density appearance, with volume loss seen the level of the right lateral ventricle. Small vessel infarct seen in the low left thalamus. Subtle high-density asymmetrically in the left caudate head is likely mineralization. No associated swelling to suggest acute hemorrhage. No acute hemorrhage seen throughout the scan. Chronic microvascular ischemic change in the periventricular white matter. Atrophy with ventriculomegaly. Vascular: No hyperdense vessel.  Atherosclerotic calcification. Skull: No acute finding Other: Results paged at the time of interpretation on 03/01/2016 at 11:21 am to Dr. Cheral Marker, with verbal confirmation. ASPECTS [facility] Stroke Program Early CT Score) Not performed in the setting of chronic infarcts and uncertain localizing symptom. IMPRESSION: 1. No acute hemorrhage or large vessel territory infarct. 2. Interval but chronic appearing infarcts in the right posterior cerebrum, left occipital cortex, and left superior cerebellum. The right parietal infarct is large volume  and could obscure peri-infarct ischemia. 3. Interval low and medial left thalamus infarct. Electronically Signed   By: Monte Fantasia M.D.   On: 03/01/2016 11:27    EKG: Independently reviewed. Sinus rhythm Right bundle branch block Inferior infarct, old agree. no STEMI. 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 Acute encephalopathy. Etiology uncertain seizure versus stroke. Patient with a recent history of stroke eating or at risk for both. CT of the head was no acute hemorrhage or large vessel infarct. Evaluated by neurology who recommended an MRI. No metabolic derangements but patient is on peritoneal dialysis with a creatinine of 9. She has not missed dialysis per family.wBCs is 11.1 -Admit to telemetry -Follow MRI results -frequent neuro checks -Obtain an EEG -Obtain a chest x-ray and urinalysis to evaluate for infectious process. -Seizure precautions -When necessary Ativan -Neurology recommendations  #2. History of stroke. Chart review indicates admitted to [facility] November 11 with similar presentation. Workup revealed old and acute infarct. 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 Discharged with resolved symptoms.  -Discharged on aspirin and statin -Note also indicates there was a concern for seizure. She had a 24-hour continuous video EEG was remarkable for right-sided slowing compared to left -Of note patient is on hospice  #3. Chronic kidney disease stage IV. Patient on peritoneal dialysis -Nephrology  consult -Intake and output  #4. Chronic systolic heart failure. Appears compensated. Chart review indicates echo done 2016 with an EF of 30%.  -Continue peritoneal dialysis -Monitor intake and output -Daily weights  #5. COPD. Not on home oxygen.  Review indicates pulmonary function tests done 1 year ago. Note indicates tests consistent with moderate/borderline severe emphysema with severe air trapping -Continue home meds  #6. Tobacco use -Cessation counseling offered  DVT prophylaxis: scd  Code Status: dnr  Family Communication: son at bedside  Disposition Plan: home  Consults called: [place] and [place]  Admission status: obs   Dyanne Carrel M MD [facility]  If 7PM-7AM, please contact night-coverage [email or web address] Password [facility]  03/01/2016, 2:36 PM

## 2016-03-01 NOTE — Progress Notes (Signed)
Pt received at this time from MRI received report prior to pt arrival from [place]. Pt alert, verbal with no noted distress. Pt oriented to room. Safety measures in place.  EEG test in her room at this time. Call bell within reach. Will continue to monitor.

## 2016-03-01 NOTE — Consult Note (Signed)
Renal Service Consult Note [facility]  Lydia Clark 03/01/2016 [place] Requesting Physician:  Dr Marily Memos  Reason for Consult:  ESRD pt of Dr Rolly Salter with AMS HPI: The patient is a 75 y.o. year-old with history of COPD, HTN, RA on Humira and ESRD on PD brought to ED due to AMS and difficulty speaking.  Family stated per ED notes that pt was not responding but did not lose consciousness. Episode of lip-smacking/ not responding, then "cleared up" again.  Pt had a stroke last month, was admitted at [facility] where MRI showed new and old strokes, see H&P.  Asked to see for ESRD.   Patient is poor historian.  Knows year and place.  No CP, abd pain, no nv/d, no difficulty w her PD. NO family here.    Hx decreased LVEF at 30%.  Hx COPD not on home O2.  Hx tobacco use and hospice.       ROS  denies CP  no joint pain   no HA  no blurry vision  no rash  no diarrhea  no nausea/ vomiting  no dysuria  no difficulty voiding  no change in urine color    Past Medical History  Past Medical History:  Diagnosis Date  . Allergy   . Anxiety    anxiety  . Arthritis   . Breast screening, unspecified 2013  . Cancer [facility]) 2008   left breast  . CHF (congestive heart failure) ([place])   . Chronic kidney disease 2014   stage IV chronic   . COPD (chronic obstructive pulmonary disease) ([place])   . Dialysis patient ([place]) 2014  . Endocrine disorder 2008   thyroid  . GERD (gastroesophageal reflux disease)   . Heart murmur    child  . History of kidney stones   . Hypertension 2006  . Multinodular goiter    followed by Dr. Carloyn Manner @ [facility]  . PE (pulmonary embolism)    patient denies  . Personal history of malignant neoplasm of breast 2008  . Personal history of tobacco use, presenting hazards to health 2012   30 yrs smoking  . Pneumonia    hx  . Special screening for malignant neoplasms, colon 2013  . Stroke ([place])   . Swelling of throat    swelling at  base of throat,   Past Surgical History  Past Surgical History:  Procedure Laterality Date  . APPENDECTOMY  2005  . BREAST BIOPSY  2005  . BREAST SURGERY Left 2008   lumpectomy  . COLONOSCOPY  2007   done in [place]  . FOOT SURGERY  2005  . fooy Left    lft foot  . HIP ARTHROPLASTY Left 01/10/2015   Procedure: ARTHROPLASTY BIPOLAR HIP (HEMIARTHROPLASTY);  Surgeon: Claud Kelp, MD;  Location: [facility] ORS;  Service: Orthopedics;  Laterality: Left;  . INSERTION OF DIALYSIS CATHETER Right 07/02/2012   Procedure: INSERTION OF DIALYSIS CATHETER;  Surgeon: Elam Dutch, MD;  Location: [place];  Service: Vascular;  Laterality: Right;  Right Internal Jugular  . INSERTION OF DIALYSIS CATHETER Right July 02 2012   right chest/ temporary cath  . PERIPHERAL VASCULAR CATHETERIZATION N/A 01/13/2015   Procedure: Dialysis/Perma Catheter Insertion;  Surgeon: Algernon Huxley, MD;  Location: [place] [facility] LAB;  Service: Cardiovascular;  Laterality: N/A;  . REPLACEMENT TOTAL KNEE Left 2012   Partial   . SPLENECTOMY, TOTAL  1956   not sure if partial or total  . TONSILLECTOMY  1950   Family History  Family History  Problem Relation Age of Onset  . Heart attack Father   . Pneumonia Father   . Breast cancer Maternal Aunt   . Breast cancer Paternal Aunt    Social History  reports that she quit smoking about 13 months ago. Her smoking use included Cigarettes. She has a 9.00 pack-year smoking history. She has never used smokeless tobacco. She reports that she does not drink alcohol or use drugs. Allergies  Allergies  Allergen Reactions  . Bee Venom Swelling and Other (See Comments)    Breathing problems  . Biaxin [Clarithromycin] Other (See Comments)    Throat swells  . Hydrocodone Nausea And Vomiting  . Mycostatin [Nystatin] Other (See Comments)    Blisters from the ointment  . Polysorbate     Other reaction(s): Unknown  . Sulfa Antibiotics Other (See Comments)    "welps"  . Penicillins  Rash    Has patient had a PCN reaction causing immediate rash, faciYesal/tongue/throat swelling, SOB or lightheadedness with hypotension:  Has patient had a PCN reaction causing severe rash involving mucus membranes or skin necrosis: No Has patient had a PCN reaction that required hospitalization No Has patient had a PCN reaction occurring within the last 10 years: No If all of the above answers are "NO", then may proceed with Cephalosporin use.    Home medications Prior to Admission medications   Medication Sig Start Date End Date Taking? Authorizing Provider  amitriptyline (ELAVIL) 25 MG tablet Take 25 mg by mouth at bedtime.   Yes Historical Provider, MD  aspirin 81 MG tablet Take 81 mg by mouth every evening.    Yes Historical Provider, MD  calcium acetate (PHOSLO) 667 MG capsule Take 667 mg by mouth 3 (three) times daily with meals.   Yes Historical Provider, MD  docusate sodium (COLACE) 100 MG capsule Take 100 mg by mouth every evening.   Yes Historical Provider, MD  Fluticasone-Salmeterol (ADVAIR DISKUS) 250-50 MCG/DOSE AEPB Inhale 1 puff into the lungs every 12 (twelve) hours. 03/03/15 03/02/16 Yes Laverle Hobby, MD  omeprazole (PRILOSEC) 20 MG capsule Take 20 mg by mouth daily.   Yes Historical Provider, MD  PRESCRIPTION MEDICATION Apply 1 application topically at bedtime as needed (to dialysis site). cream   Yes Historical Provider, MD  rosuvastatin (CRESTOR) 10 MG tablet Take 10 mg by mouth at bedtime.    Yes Historical Provider, MD  sennosides-docusate sodium (SENOKOT-S) 8.6-50 MG tablet Take 1 tablet by mouth daily.   Yes Historical Provider, MD  tiotropium (SPIRIVA) 18 MCG inhalation capsule Place 1 capsule (18 mcg total) into inhaler and inhale at bedtime. 03/03/15  Yes Laverle Hobby, MD   Liver Function Tests  Recent Labs Lab 03/01/16 1101  AST 21  ALT 10*  ALKPHOS 57  BILITOT 0.4  PROT 6.0*  ALBUMIN 2.5*   No results for input(s): LIPASE, AMYLASE in  the last 168 hours. CBC  Recent Labs Lab 03/01/16 1101 03/01/16 1108  WBC 11.1*  --   NEUTROABS 7.0  --   HGB 9.3* 9.2*  HCT 29.2* 27.0*  MCV 96.1  --   PLT 363  --    Basic Metabolic Panel  Recent Labs Lab 03/01/16 1101 03/01/16 1108  NA 139 138  K 3.6 3.6  CL 98* 97*  CO2 28  --   GLUCOSE 105* 101*  BUN 25* 28*  CREATININE 10.07* 9.80*  CALCIUM 9.4  --    Iron/TIBC/Ferritin/ %Sat No results  found for: IRON, TIBC, FERRITIN, IRONPCTSAT  Vitals:   03/01/16 1130 03/01/16 1138 03/01/16 1144  BP: 143/77    Pulse: 72    Resp:   16  Temp:  97.7 F (36.5 C)   TempSrc:  Rectal   SpO2: 95%    Weight:  60 kg (132 lb 4.4 oz)   Height:  5\' 8"  (1.727 m)    Exam Gen frail elderly WF, a bit cachectic, no distress, calm No rash, cyanosis or gangrene Sclera anicteric, throat clear and moist  No jvd or bruits Chest clear bilat to bases RRR no MRG Abd soft ntnd no mass or ascites +bs, R mid abdomen PD cath intact GU defer MS no joint effusions or deformity Ext no LE or UE edema / no wounds or ulcers Neuro is alert, Ox 3 , nf, moves UE's and LE's equally, mild gen'd weakness    Dialysis:  peritoneal dialysis/ Dr Rolly Salter, PD nurse # [phone]   Assessment: 1. AMS - r/o CVA vs seizure.  W/u in progress , per primary 2. Hx recent CVA - old CVA's as well per MRI done at [facility] in November 3. COPD 4. Tobacco use 5. Systolic heart failure - last EF 30% 6. ESRD on PD - f/b [facility] 7. DNR 8. BP/ volume - not on BP medications, euvolemic on exam    Plan - unable to reach PD nurse for patient.  Plan 5 exchanges/ day of 1.5% for now.   Kelly Splinter MD [employer] pager [phone]   03/01/2016, 4:21 PM

## 2016-03-01 NOTE — Progress Notes (Signed)
Pt passed swallow evaluation.

## 2016-03-01 NOTE — ED Triage Notes (Signed)
Pt here from home via ems for acute onset altered mental status.  Family told EMS that pt is normally ao x 4 and she just began not making sense.  EMS states no facial droop or mechanical deficits, only aphasia, and pt is improved mentally since initial assessment.

## 2016-03-01 NOTE — ED Notes (Signed)
Pt to MRI

## 2016-03-01 NOTE — ED Notes (Signed)
Aphasia improving.

## 2016-03-01 NOTE — Telephone Encounter (Signed)
RECALL FOR TCS °

## 2016-03-02 DIAGNOSIS — Z515 Encounter for palliative care: Secondary | ICD-10-CM | POA: Diagnosis present

## 2016-03-02 DIAGNOSIS — Z8249 Family history of ischemic heart disease and other diseases of the circulatory system: Secondary | ICD-10-CM | POA: Diagnosis not present

## 2016-03-02 DIAGNOSIS — Z803 Family history of malignant neoplasm of breast: Secondary | ICD-10-CM | POA: Diagnosis not present

## 2016-03-02 DIAGNOSIS — J439 Emphysema, unspecified: Secondary | ICD-10-CM | POA: Diagnosis present

## 2016-03-02 DIAGNOSIS — R4182 Altered mental status, unspecified: Secondary | ICD-10-CM | POA: Diagnosis not present

## 2016-03-02 DIAGNOSIS — Z8673 Personal history of transient ischemic attack (TIA), and cerebral infarction without residual deficits: Secondary | ICD-10-CM | POA: Diagnosis not present

## 2016-03-02 DIAGNOSIS — Z66 Do not resuscitate: Secondary | ICD-10-CM | POA: Diagnosis present

## 2016-03-02 DIAGNOSIS — Z880 Allergy status to penicillin: Secondary | ICD-10-CM | POA: Diagnosis not present

## 2016-03-02 DIAGNOSIS — G9340 Encephalopathy, unspecified: Secondary | ICD-10-CM | POA: Diagnosis not present

## 2016-03-02 DIAGNOSIS — I132 Hypertensive heart and chronic kidney disease with heart failure and with stage 5 chronic kidney disease, or end stage renal disease: Secondary | ICD-10-CM | POA: Diagnosis present

## 2016-03-02 DIAGNOSIS — Z87891 Personal history of nicotine dependence: Secondary | ICD-10-CM | POA: Diagnosis not present

## 2016-03-02 DIAGNOSIS — K219 Gastro-esophageal reflux disease without esophagitis: Secondary | ICD-10-CM | POA: Diagnosis present

## 2016-03-02 DIAGNOSIS — Z96642 Presence of left artificial hip joint: Secondary | ICD-10-CM | POA: Diagnosis present

## 2016-03-02 DIAGNOSIS — G40901 Epilepsy, unspecified, not intractable, with status epilepticus: Secondary | ICD-10-CM | POA: Diagnosis not present

## 2016-03-02 DIAGNOSIS — G9349 Other encephalopathy: Secondary | ICD-10-CM | POA: Diagnosis present

## 2016-03-02 DIAGNOSIS — F419 Anxiety disorder, unspecified: Secondary | ICD-10-CM | POA: Diagnosis present

## 2016-03-02 DIAGNOSIS — I5022 Chronic systolic (congestive) heart failure: Secondary | ICD-10-CM | POA: Diagnosis present

## 2016-03-02 DIAGNOSIS — Z9081 Acquired absence of spleen: Secondary | ICD-10-CM | POA: Diagnosis not present

## 2016-03-02 DIAGNOSIS — Z885 Allergy status to narcotic agent status: Secondary | ICD-10-CM | POA: Diagnosis not present

## 2016-03-02 DIAGNOSIS — Z853 Personal history of malignant neoplasm of breast: Secondary | ICD-10-CM | POA: Diagnosis not present

## 2016-03-02 DIAGNOSIS — Z992 Dependence on renal dialysis: Secondary | ICD-10-CM | POA: Diagnosis not present

## 2016-03-02 DIAGNOSIS — N186 End stage renal disease: Secondary | ICD-10-CM | POA: Diagnosis not present

## 2016-03-02 DIAGNOSIS — I4510 Unspecified right bundle-branch block: Secondary | ICD-10-CM | POA: Diagnosis present

## 2016-03-02 DIAGNOSIS — J449 Chronic obstructive pulmonary disease, unspecified: Secondary | ICD-10-CM | POA: Diagnosis not present

## 2016-03-02 DIAGNOSIS — R569 Unspecified convulsions: Secondary | ICD-10-CM | POA: Diagnosis not present

## 2016-03-02 DIAGNOSIS — R4701 Aphasia: Secondary | ICD-10-CM | POA: Diagnosis present

## 2016-03-02 DIAGNOSIS — M069 Rheumatoid arthritis, unspecified: Secondary | ICD-10-CM | POA: Diagnosis present

## 2016-03-02 DIAGNOSIS — G936 Cerebral edema: Secondary | ICD-10-CM | POA: Diagnosis present

## 2016-03-02 DIAGNOSIS — R531 Weakness: Secondary | ICD-10-CM | POA: Diagnosis not present

## 2016-03-02 DIAGNOSIS — G40209 Localization-related (focal) (partial) symptomatic epilepsy and epileptic syndromes with complex partial seizures, not intractable, without status epilepticus: Secondary | ICD-10-CM | POA: Diagnosis present

## 2016-03-02 LAB — TSH: TSH: 1.119 u[IU]/mL (ref 0.350–4.500)

## 2016-03-02 LAB — BASIC METABOLIC PANEL
ANION GAP: 15 (ref 5–15)
BUN: 26 mg/dL — AB (ref 6–20)
CHLORIDE: 97 mmol/L — AB (ref 101–111)
CO2: 27 mmol/L (ref 22–32)
Calcium: 9.1 mg/dL (ref 8.9–10.3)
Creatinine, Ser: 9.8 mg/dL — ABNORMAL HIGH (ref 0.44–1.00)
GFR calc Af Amer: 4 mL/min — ABNORMAL LOW (ref >60)
GFR calc non Af Amer: 3 mL/min — ABNORMAL LOW (ref >60)
GLUCOSE: 101 mg/dL — AB (ref 65–99)
POTASSIUM: 3.2 mmol/L — AB (ref 3.5–5.1)
Sodium: 139 mmol/L (ref 135–145)

## 2016-03-02 LAB — CBC
HEMATOCRIT: 28.7 % — AB (ref 36.0–46.0)
HEMOGLOBIN: 8.8 g/dL — AB (ref 12.0–15.0)
MCH: 30.2 pg (ref 26.0–34.0)
MCHC: 30.7 g/dL (ref 30.0–36.0)
MCV: 98.6 fL (ref 78.0–100.0)
Platelets: 366 10*3/uL (ref 150–400)
RBC: 2.91 MIL/uL — ABNORMAL LOW (ref 3.87–5.11)
RDW: 13.5 % (ref 11.5–15.5)
WBC: 10.2 10*3/uL (ref 4.0–10.5)

## 2016-03-02 LAB — FOLATE RBC
Folate, Hemolysate: 373.8 ng/mL
Folate, RBC: 1449 ng/mL (ref >498)
Hematocrit: 25.8 % — ABNORMAL LOW (ref 34.0–46.6)

## 2016-03-02 MED ORDER — VALPROATE SODIUM 500 MG/5ML IV SOLN
750.0000 mg | Freq: Once | INTRAVENOUS | Status: AC
  Administered 2016-03-02: 750 mg via INTRAVENOUS
  Filled 2016-03-02: qty 7.5

## 2016-03-02 MED ORDER — VALPROATE SODIUM 500 MG/5ML IV SOLN
250.0000 mg | Freq: Three times a day (TID) | INTRAVENOUS | Status: DC
  Administered 2016-03-02: 250 mg via INTRAVENOUS
  Filled 2016-03-02: qty 2.5

## 2016-03-02 MED ORDER — VALPROIC ACID 250 MG PO CAPS
250.0000 mg | ORAL_CAPSULE | Freq: Three times a day (TID) | ORAL | Status: DC
  Administered 2016-03-02 – 2016-03-03 (×3): 250 mg via ORAL
  Filled 2016-03-02 (×3): qty 1

## 2016-03-02 NOTE — Progress Notes (Signed)
PROGRESS NOTE    DENNETTE HEDEEN  [id number] DOB: Jan 19, 1941 DOA: 03/01/2016 PCP: Lavonia Dana, MD   Outpatient Specialists:     Brief Narrative:  Lydia Clark is a 75 y.o. female with medical history significant for CHF, chronic kidney disease, COPD, GERD, hypertension, recent stroke presents to the emergency Department chief complaint altered mental status and difficulty speaking. Sleep patient was code stroke which was discontinued after evaluation by neurology. Being admitted for observation as symptoms have improved. Question seizure versus stroke  Information is obtained from the family is at the bedside and the patient noting that information from patient may be unreliable secondary to acute encephalopathy. Family reports patient was in her usual state of health eating breakfast this morning and then all of a sudden became "unresponsive" she did not lose consciousness. She just turned her head to the left and was "smacking her lips" staring and not responding to questions and commands directions. This lasted for a few minutes and then she "cleared up". Then a second episode mostly identical to the first occurred again. Once she became responsive they found her to be somewhat irritable but would follow commands and speak. They deny any recent illness nausea vomiting diarrhea. They do report she's had a decreased oral intake since her stroke last month. She uses a cane and has an unsteady gait but they deny any recent falls. His on peritoneal dialysis and had dialysis last night per her usual. Recent complaints of headache dizziness syncope or near-syncope. No complaints of chest pain palpitations shortness of breath lower extremity edema. No recent fever chills travel or sick contacts.   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  -suspect due to seizures -will load with  valproic acid IV 15 mg/kg x 1 then 5 mh/kd PO TID  History of stroke. Chart review indicates admitted to [facility] November 11 with similar presentation. Workup revealed old and acute infarct. 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 Discharged with resolved symptoms.  -Discharged on aspirin and statin -Note also indicates there was a concern for seizure. She had a 24-hour continuous video EEG was remarkable for right-sided slowing compared to left -Of note patient is on hospice- to return once discharged  Chronic kidney disease stage IV. Patient on peritoneal dialysis -Nephrology consult  Chronic systolic heart failure. Appears compensated. Chart review indicates echo done 2016 with an EF of 30%.  -Continue peritoneal dialysis -Monitor intake and output -Daily weights  COPD. Not on home oxygen. Review indicates pulmonary function tests done 1 year ago. Note indicates tests consistent with moderate/borderline severe emphysema with severe air trapping -Continue home meds   Tobacco use -Cessation counseling offered     Code Status: DNR   Family Communication: Son at bedside  Disposition Plan:  Home in AM   Consultants:   neuro     Subjective: No SOB, no CP-- just tired  Objective: Vitals:   03/02/16 0024 03/02/16 0224 03/02/16 0422 03/02/16 1035  BP: Marland Kitchen)  155/73 92/71 134/64 133/69  Pulse: 88 74 78 81  Resp: 18 18 18 16   Temp: 98.6 F (37 C) 98.7 F (37.1 C) 98.7 F (37.1 C) 98.6 F (37 C)  TempSrc: Oral Oral Oral Oral  SpO2: 93% 97% 98% 99%  Weight:   65.8 kg (145 lb 1.6  oz)   Height:       No intake or output data in the 24 hours ending 03/02/16 1310 Filed Weights   03/01/16 1138 03/02/16 0422  Weight: 60 kg (132 lb 4.4 oz) 65.8 kg (145 lb 1.6 oz)    Examination:  General exam: Appears calm and comfortable  Respiratory system: Clear to auscultation. Respiratory effort normal. Cardiovascular system: S1 & S2 heard, RRR. No JVD, murmurs, rubs, gallops or clicks. No pedal edema. Gastrointestinal system: Abdomen is nondistended, soft and nontender. No organomegaly or masses felt. Normal bowel sounds heard.    Data Reviewed: I have personally reviewed following labs and imaging studies  CBC:  Recent Labs Lab 03/01/16 1101 03/01/16 1108 03/02/16 0702  WBC 11.1*  --  10.2  NEUTROABS 7.0  --   --   HGB 9.3* 9.2* 8.8*  HCT 29.2* 27.0* 28.7*  MCV 96.1  --  98.6  PLT 363  --  [id number]   Basic Metabolic Panel:  Recent Labs Lab 03/01/16 1101 03/01/16 1108 03/02/16 0702  NA 139 138 139  K 3.6 3.6 3.2*  CL 98* 97* 97*  CO2 28  --  27  GLUCOSE 105* 101* 101*  BUN 25* 28* 26*  CREATININE 10.07* 9.80* 9.80*  CALCIUM 9.4  --  9.1   GFR: Estimated Creatinine Clearance: 5.2 mL/min (by C-G formula based on SCr of 9.8 mg/dL (H)). Liver Function Tests:  Recent Labs Lab 03/01/16 1101  AST 21  ALT 10*  ALKPHOS 57  BILITOT 0.4  PROT 6.0*  ALBUMIN 2.5*   No results for input(s): LIPASE, AMYLASE in the last 168 hours.  Recent Labs Lab 03/01/16 1648  AMMONIA 19   Coagulation Profile:  Recent Labs Lab 03/01/16 1101  INR 0.88   Cardiac Enzymes: No results for input(s): CKTOTAL, CKMB, CKMBINDEX, TROPONINI in the last 168 hours. BNP (last 3 results) No results for input(s): PROBNP in the last 8760 hours. HbA1C: No results for input(s): HGBA1C in the last 72 hours. CBG:  Recent Labs Lab 03/01/16 1131  GLUCAP 104*   Lipid Profile: No results for input(s): CHOL, HDL, LDLCALC, TRIG, CHOLHDL, LDLDIRECT in the last 72 hours. Thyroid  Function Tests:  Recent Labs  03/02/16 0956  TSH 1.119   Anemia Panel:  Recent Labs  03/01/16 1648  VITAMINB12 504   Urine analysis: No results found for: COLORURINE, APPEARANCEUR, LABSPEC, PHURINE, GLUCOSEU, HGBUR, BILIRUBINUR, KETONESUR, PROTEINUR, UROBILINOGEN, NITRITE, LEUKOCYTESUR   )No results found for this or any previous visit (from the past 240 hour(s)).    Anti-infectives    None       Radiology Studies: Mr Brain 68 Contrast  Result Date: 03/01/2016 CLINICAL DATA:  75 year old female with mental status change, abnormal head CT with posterior right MCA ischemic sequelae which is new since 2016. Possible seizure today. Initial encounter. EXAM: MRI HEAD WITHOUT CONTRAST TECHNIQUE: Multiplanar, multiecho pulse sequences of the brain and surrounding structures were obtained without intravenous contrast. COMPARISON:  Head CT without contrast 1105 hours today. Prior studies including brain MRI 12/10/2007 FINDINGS: Brain: There is gyriform diffusion abnormality involving a 3-4 cm area of the posterior left hemisphere corresponding to the  left parietal lobe (series 11, image 33). No associated hemorrhage or mass effect. There is posterior right MCA territory encephalomalacia primarily affecting the right parietal lobe with laminar necrosis and hemosiderin. There is heterogeneous trace diffusion signal in this parenchyma with no definite acute ischemia here. No other restricted diffusion. Chronic central and left superior cerebellar infarcts are new since 2009. Chronic lacunar infarct of the medial left thalamus is also new. Patchy and confluent bilateral cerebral white matter T2 and FLAIR hyperintensity is also new. There is also a small inferior left PCA territory infarct with encephalomalacia in the left occipital lobe which is new. Generalized cerebral volume loss since 2009. No midline shift, mass effect, evidence of mass lesion, ventriculomegaly, extra-axial collection or acute  intracranial hemorrhage. Cervicomedullary junction and pituitary are within normal limits. Vascular: Major intracranial vascular flow voids are stable since 2009. Skull and upper cervical spine: Negative. Chronic hyperostosis of the calvarium. Sinuses/Orbits: Negative orbits soft tissues. Mild to moderate maxillary sinus mucosal thickening. Other Visualized paranasal sinuses and mastoids are stable and well pneumatized. Other: Visible internal auditory structures appear normal. Negative scalp soft tissues. IMPRESSION: 1. Gyriform restricted diffusion involving a 3-4 cm area of the left parietal lobe. Burtis Junes this is postictal, but the differential diagnosis includes an acute posterior left MCA cortical infarct. No associated hemorrhage or mass effect. 2. 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 This along with chronic infarcts in the left PCA and bilateral superior cerebellar artery territory are new since 2009. 3. Generalized cerebral volume loss and confluent periventricular white matter signal change also since 2009. Electronically Signed   By: Genevie Ann M.D.   On: 03/01/2016 15:43   Ct Head Code Stroke W/o Cm  Result Date: 03/01/2016 CLINICAL DATA:  Code stroke.  Altered mental status. EXAM: CT HEAD WITHOUT CONTRAST TECHNIQUE: Contiguous axial images were obtained from the base of the skull through the vertex without intravenous contrast. COMPARISON:  01/09/2015 FINDINGS: Brain: Interval infarcts in the right parietal region, left inferior occipital lobe, and left superior cerebellum. The right cerebral infarct is large. These areas have a well-defined low-density appearance, with volume loss seen the level of the right lateral ventricle. Small vessel infarct seen in the low left thalamus. Subtle high-density asymmetrically in the left caudate head is likely mineralization. No associated swelling to suggest acute hemorrhage. No acute hemorrhage seen throughout the scan. Chronic  microvascular ischemic change in the periventricular white matter. Atrophy with ventriculomegaly. Vascular: No hyperdense vessel.  Atherosclerotic calcification. Skull: No acute finding Other: Results paged at the time of interpretation on 03/01/2016 at 11:21 am to Dr. Cheral Marker, with verbal confirmation. ASPECTS [facility] Stroke Program Early CT Score) Not performed in the setting of chronic infarcts and uncertain localizing symptom. IMPRESSION: 1. No acute hemorrhage or large vessel territory infarct. 2. Interval but chronic appearing infarcts in the right posterior cerebrum, left occipital cortex, and left superior cerebellum. The right parietal infarct is large volume and could obscure peri-infarct ischemia. 3. Interval low and medial left thalamus infarct. Electronically Signed   By: Monte Fantasia M.D.   On: 03/01/2016 11:27        Scheduled Meds: . aspirin EC  81 mg Oral QPM  . dialysis solution 1.5% low-MG/low-CA  13 L Intraperitoneal Q24H  . gentamicin cream  1 application Topical Daily  . heparin  5,000 Units Subcutaneous Q8H  . mometasone-formoterol  2 puff Inhalation BID  . pantoprazole  40 mg Oral Daily  . rosuvastatin  10  mg Oral QHS  . senna-docusate  1 tablet Oral Daily  . sodium chloride flush  3 mL Intravenous Q12H  . tiotropium  18 mcg Inhalation QHS  . valproate sodium  750 mg Intravenous Once  . valproic acid  250 mg Oral Q8H   Continuous Infusions:   LOS: 0 days    Time spent: 25 min    [place], DO [facility] Pager [phone]  If 7PM-7AM, please contact night-coverage [email or web address] Password [email or web address] 03/02/2016, 1:10 PM

## 2016-03-02 NOTE — Care Management Obs Status (Signed)
[place] NOTIFICATION   Patient Details  Name: Lydia Clark MRN: [id number] Date of Birth: 1940-10-12   Medicare Observation Status Notification Given:  Yes    Pollie Friar, RN 03/02/2016, 3:43 PM

## 2016-03-02 NOTE — Progress Notes (Addendum)
MEDICATION RELATED CONSULT NOTE - INITIAL   Pharmacy Consult for valproic acid Indication: seizure  Allergies  Allergen Reactions  . Bee Venom Swelling and Other (See Comments)    Breathing problems  . Biaxin [Clarithromycin] Other (See Comments)    Throat swells  . Hydrocodone Nausea And Vomiting  . Mycostatin [Nystatin] Other (See Comments)    Blisters from the ointment  . Polysorbate     Other reaction(s): Unknown  . Sulfa Antibiotics Other (See Comments)    "welps"  . Penicillins Rash    Has patient had a PCN reaction causing immediate rash, faciYesal/tongue/throat swelling, SOB or lightheadedness with hypotension:  Has patient had a PCN reaction causing severe rash involving mucus membranes or skin necrosis: No Has patient had a PCN reaction that required hospitalization No Has patient had a PCN reaction occurring within the last 10 years: No If all of the above answers are "NO", then may proceed with Cephalosporin use.     Patient Measurements: Height: 5\' 8"  (172.7 cm) Weight: 145 lb 1.6 oz (65.8 kg) IBW/kg (Calculated) : 63.9  Vital Signs: Temp: 98.7 F (37.1 C) (12/28 0422) Temp Source: Oral (12/28 0422) BP: 134/64 (12/28 0422) Pulse Rate: 78 (12/28 0422) Intake/Output from previous day: No intake/output data recorded. Intake/Output from this shift: No intake/output data recorded.  Labs:  Recent Labs  03/01/16 1101 03/01/16 1108 03/02/16 0702  WBC 11.1*  --  10.2  HGB 9.3* 9.2* 8.8*  HCT 29.2* 27.0* 28.7*  PLT 363  --  366  APTT 34  --   --   CREATININE 10.07* 9.80* 9.80*  ALBUMIN 2.5*  --   --   PROT 6.0*  --   --   AST 21  --   --   ALT 10*  --   --   ALKPHOS 57  --   --   BILITOT 0.4  --   --    Estimated Creatinine Clearance: 5.2 mL/min (by C-G formula based on SCr of 9.8 mg/dL (H)).   Microbiology: No results found for this or any previous visit (from the past 720 hour(s)).  Medical History: Past Medical History:  Diagnosis Date  .  Allergy   . Anxiety    anxiety  . Arthritis   . Breast screening, unspecified 2013  . Cancer [facility]) 2008   left breast  . CHF (congestive heart failure) ([place])   . Chronic kidney disease 2014   stage IV chronic   . COPD (chronic obstructive pulmonary disease) ([place])   . Dialysis patient ([place]) 2014  . Endocrine disorder 2008   thyroid  . GERD (gastroesophageal reflux disease)   . Heart murmur    child  . History of kidney stones   . Hypertension 2006  . Multinodular goiter    followed by Dr. Carloyn Manner @ [facility]  . PE (pulmonary embolism)    patient denies  . Personal history of malignant neoplasm of breast 2008  . Personal history of tobacco use, presenting hazards to health 2012   30 yrs smoking  . Pneumonia    hx  . Special screening for malignant neoplasms, colon 2013  . Stroke ([place])   . Swelling of throat    swelling at base of throat,    Assessment: Patient admitted with code stroke that was subsequently cancelled. Seizure vs. Stroke evaluation. Pharmacy consulted to start valproic acid. Patient is peritoneal dialysis, but valproate does not need renal adjustment. -baseline CBC and LFTs WNL  Plan:  -  valproate IV 1000mg  load (15 mg/kg), then valproate 250mg  PO Q8H (10-15 mg/kg/day total dose)   Myer Peer Grayland Ormond), PharmD  [id number] Pharmacy Resident Pager: [phone] 03/02/2016 1:12 PM

## 2016-03-02 NOTE — Progress Notes (Signed)
Subjective: Speech is improved this morning, per son.   Objective: Current vital signs: BP 134/64 (BP Location: Right Leg)   Pulse 78   Temp 98.7 F (37.1 C) (Oral)   Resp 18   Ht 5' 8"  (1.727 m)   Wt 65.8 kg (145 lb 1.6 oz)   SpO2 98%   BMI 22.06 kg/m  Vital signs in last 24 hours: Temp:  [97.7 F (36.5 C)-98.8 F (37.1 C)] 98.7 F (37.1 C) (12/28 0422) Pulse Rate:  [72-88] 78 (12/28 0422) Resp:  [16-18] 18 (12/28 0422) BP: (92-155)/(64-112) 134/64 (12/28 0422) SpO2:  [91 %-98 %] 98 % (12/28 0422) Weight:  [60 kg (132 lb 4.4 oz)-65.8 kg (145 lb 1.6 oz)] 65.8 kg (145 lb 1.6 oz) (12/28 0422)  Intake/Output from previous day: No intake/output data recorded. Intake/Output this shift: No intake/output data recorded. Nutritional status: Diet heart healthy/carb modified Room service appropriate? Yes; Fluid consistency: Thin  Neurologic Exam: Ment: Awake and more alert than yesterday. Paucity of spontaneous speech. Dysthymic affect. Answers to questions are factually correct per son. No errors of grammar or syntax. Simple naming intact. Overall improved relative to yesterday.  CN: PERRL, visual fields intact to gross confrontation testing. EOMI without nystagmus.  Face symmetric. No hypophonia noted. XI - Symmetric. Tongue midline.  Motor: 5/5 x 4.  Sensory: Intact to FT x 4.   Reflexes: Mild diffuse hyperreflexia with downgoing toes.  Cerebellar: No ataxia with FNF bilaterally.  Gait: Deferred.   Lab Results: Results for orders placed or performed during the hospital encounter of 03/01/16 (from the past 48 hour(s))  Ethanol     Status: None   Collection Time: 03/01/16 11:01 AM  Result Value Ref Range   Alcohol, Ethyl (B) <5 <5 mg/dL    Comment:        LOWEST DETECTABLE LIMIT FOR SERUM ALCOHOL IS 5 mg/dL FOR MEDICAL PURPOSES ONLY   Protime-INR     Status: None   Collection Time: 03/01/16 11:01 AM  Result Value Ref Range   Prothrombin Time 11.9 11.4 - 15.2 seconds    INR 0.88   APTT     Status: None   Collection Time: 03/01/16 11:01 AM  Result Value Ref Range   aPTT 34 24 - 36 seconds  CBC     Status: Abnormal   Collection Time: 03/01/16 11:01 AM  Result Value Ref Range   WBC 11.1 (H) 4.0 - 10.5 K/uL   RBC 3.04 (L) 3.87 - 5.11 MIL/uL   Hemoglobin 9.3 (L) 12.0 - 15.0 g/dL   HCT 29.2 (L) 36.0 - 46.0 %   MCV 96.1 78.0 - 100.0 fL   MCH 30.6 26.0 - 34.0 pg   MCHC 31.8 30.0 - 36.0 g/dL   RDW 13.3 11.5 - 15.5 %   Platelets 363 150 - 400 K/uL  Differential     Status: Abnormal   Collection Time: 03/01/16 11:01 AM  Result Value Ref Range   Neutrophils Relative % 63 %   Neutro Abs 7.0 1.7 - 7.7 K/uL   Lymphocytes Relative 22 %   Lymphs Abs 2.5 0.7 - 4.0 K/uL   Monocytes Relative 10 %   Monocytes Absolute 1.1 (H) 0.1 - 1.0 K/uL   Eosinophils Relative 5 %   Eosinophils Absolute 0.5 0.0 - 0.7 K/uL   Basophils Relative 0 %   Basophils Absolute 0.0 0.0 - 0.1 K/uL  Comprehensive metabolic panel     Status: Abnormal   Collection Time: 03/01/16 11:01 AM  Result Value Ref Range   Sodium 139 135 - 145 mmol/L   Potassium 3.6 3.5 - 5.1 mmol/L   Chloride 98 (L) 101 - 111 mmol/L   CO2 28 22 - 32 mmol/L   Glucose, Bld 105 (H) 65 - 99 mg/dL   BUN 25 (H) 6 - 20 mg/dL   Creatinine, Ser 10.07 (H) 0.44 - 1.00 mg/dL   Calcium 9.4 8.9 - 10.3 mg/dL   Total Protein 6.0 (L) 6.5 - 8.1 g/dL   Albumin 2.5 (L) 3.5 - 5.0 g/dL   AST 21 15 - 41 U/L   ALT 10 (L) 14 - 54 U/L   Alkaline Phosphatase 57 38 - 126 U/L   Total Bilirubin 0.4 0.3 - 1.2 mg/dL   GFR calc non Af Amer 3 (L) >60 mL/min   GFR calc Af Amer 4 (L) >60 mL/min    Comment: (NOTE) The eGFR has been calculated using the CKD EPI equation. This calculation has not been validated in all clinical situations. eGFR's persistently <60 mL/min signify possible Chronic Kidney Disease.    Anion gap 13 5 - 15  I-stat troponin, ED (not at [facility], [facility])     Status: None   Collection Time: 03/01/16 11:07 AM  Result Value  Ref Range   Troponin i, poc 0.07 0.00 - 0.08 ng/mL   Comment 3            Comment: Due to the release kinetics of cTnI, a negative result within the first hours of the onset of symptoms does not rule out myocardial infarction with certainty. If myocardial infarction is still suspected, repeat the test at appropriate intervals.   I-Stat Chem 8, ED  (not at [facility], [facility])     Status: Abnormal   Collection Time: 03/01/16 11:08 AM  Result Value Ref Range   Sodium 138 135 - 145 mmol/L   Potassium 3.6 3.5 - 5.1 mmol/L   Chloride 97 (L) 101 - 111 mmol/L   BUN 28 (H) 6 - 20 mg/dL   Creatinine, Ser 9.80 (H) 0.44 - 1.00 mg/dL   Glucose, Bld 101 (H) 65 - 99 mg/dL   Calcium, Ion 1.11 (L) 1.15 - 1.40 mmol/L   TCO2 30 0 - 100 mmol/L   Hemoglobin 9.2 (L) 12.0 - 15.0 g/dL   HCT 27.0 (L) 36.0 - 46.0 %  CBG monitoring, ED     Status: Abnormal   Collection Time: 03/01/16 11:31 AM  Result Value Ref Range   Glucose-Capillary 104 (H) 65 - 99 mg/dL  Ammonia     Status: None   Collection Time: 03/01/16  4:48 PM  Result Value Ref Range   Ammonia 19 9 - 35 umol/L  Vitamin B12     Status: None   Collection Time: 03/01/16  4:48 PM  Result Value Ref Range   Vitamin B-12 504 180 - 914 pg/mL    Comment: (NOTE) This assay is not validated for testing neonatal or myeloproliferative syndrome specimens for Vitamin B12 levels.   CBC     Status: Abnormal   Collection Time: 03/02/16  7:02 AM  Result Value Ref Range   WBC 10.2 4.0 - 10.5 K/uL   RBC 2.91 (L) 3.87 - 5.11 MIL/uL   Hemoglobin 8.8 (L) 12.0 - 15.0 g/dL   HCT 28.7 (L) 36.0 - 46.0 %   MCV 98.6 78.0 - 100.0 fL   MCH 30.2 26.0 - 34.0 pg   MCHC 30.7 30.0 - 36.0 g/dL   RDW 13.5  11.5 - 15.5 %   Platelets 366 150 - 400 K/uL    No results found for this or any previous visit (from the past 240 hour(s)).  Lipid Panel No results for input(s): CHOL, TRIG, HDL, CHOLHDL, VLDL, LDLCALC in the last 72 hours.  Studies/Results: Mr Brain Wo  Contrast  Result Date: 03/01/2016 CLINICAL DATA:  75 year old female with mental status change, abnormal head CT with posterior right MCA ischemic sequelae which is new since 2016. Possible seizure today. Initial encounter. EXAM: MRI HEAD WITHOUT CONTRAST TECHNIQUE: Multiplanar, multiecho pulse sequences of the brain and surrounding structures were obtained without intravenous contrast. COMPARISON:  Head CT without contrast 1105 hours today. Prior studies including brain MRI 12/10/2007 FINDINGS: Brain: There is gyriform diffusion abnormality involving a 3-4 cm area of the posterior left hemisphere corresponding to the left parietal lobe (series 11, image 33). No associated hemorrhage or mass effect. There is posterior right MCA territory encephalomalacia primarily affecting the right parietal lobe with laminar necrosis and hemosiderin. There is heterogeneous trace diffusion signal in this parenchyma with no definite acute ischemia here. No other restricted diffusion. Chronic central and left superior cerebellar infarcts are new since 2009. Chronic lacunar infarct of the medial left thalamus is also new. Patchy and confluent bilateral cerebral white matter T2 and FLAIR hyperintensity is also new. There is also a small inferior left PCA territory infarct with encephalomalacia in the left occipital lobe which is new. Generalized cerebral volume loss since 2009. No midline shift, mass effect, evidence of mass lesion, ventriculomegaly, extra-axial collection or acute intracranial hemorrhage. Cervicomedullary junction and pituitary are within normal limits. Vascular: Major intracranial vascular flow voids are stable since 2009. Skull and upper cervical spine: Negative. Chronic hyperostosis of the calvarium. Sinuses/Orbits: Negative orbits soft tissues. Mild to moderate maxillary sinus mucosal thickening. Other Visualized paranasal sinuses and mastoids are stable and well pneumatized. Other: Visible internal auditory  structures appear normal. Negative scalp soft tissues. IMPRESSION: 1. Gyriform restricted diffusion involving a 3-4 cm area of the left parietal lobe. Burtis Junes this is postictal, but the differential diagnosis includes an acute posterior left MCA cortical infarct. No associated hemorrhage or mass effect. 2. 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 This along with chronic infarcts in the left PCA and bilateral superior cerebellar artery territory are new since 2009. 3. Generalized cerebral volume loss and confluent periventricular white matter signal change also since 2009. Electronically Signed   By: Genevie Ann M.D.   On: 03/01/2016 15:43   Ct Head Code Stroke W/o Cm  Result Date: 03/01/2016 CLINICAL DATA:  Code stroke.  Altered mental status. EXAM: CT HEAD WITHOUT CONTRAST TECHNIQUE: Contiguous axial images were obtained from the base of the skull through the vertex without intravenous contrast. COMPARISON:  01/09/2015 FINDINGS: Brain: Interval infarcts in the right parietal region, left inferior occipital lobe, and left superior cerebellum. The right cerebral infarct is large. These areas have a well-defined low-density appearance, with volume loss seen the level of the right lateral ventricle. Small vessel infarct seen in the low left thalamus. Subtle high-density asymmetrically in the left caudate head is likely mineralization. No associated swelling to suggest acute hemorrhage. No acute hemorrhage seen throughout the scan. Chronic microvascular ischemic change in the periventricular white matter. Atrophy with ventriculomegaly. Vascular: No hyperdense vessel.  Atherosclerotic calcification. Skull: No acute finding Other: Results paged at the time of interpretation on 03/01/2016 at 11:21 am to Dr. Cheral Marker, with verbal confirmation. ASPECTS [facility] Stroke Program Early CT  Score) Not performed in the setting of chronic infarcts and uncertain localizing symptom. IMPRESSION: 1. No  acute hemorrhage or large vessel territory infarct. 2. Interval but chronic appearing infarcts in the right posterior cerebrum, left occipital cortex, and left superior cerebellum. The right parietal infarct is large volume and could obscure peri-infarct ischemia. 3. Interval low and medial left thalamus infarct. Electronically Signed   By: Monte Fantasia M.D.   On: 03/01/2016 11:27    Medications:  Scheduled: . aspirin EC  81 mg Oral QPM  . dialysis solution 1.5% low-MG/low-CA  13 L Intraperitoneal Q24H  . gentamicin cream  1 application Topical Daily  . heparin  5,000 Units Subcutaneous Q8H  . mometasone-formoterol  2 puff Inhalation BID  . pantoprazole  40 mg Oral Daily  . rosuvastatin  10 mg Oral QHS  . senna-docusate  1 tablet Oral Daily  . sodium chloride flush  3 mL Intravenous Q12H  . tiotropium  18 mcg Inhalation QHS    Assessment: 75 y.o. female with acute onset of symptoms that are most consistent with seizure, followed by AMS more consistent with diffuse cerebral dysfunction in postictal state rather than aphasia due to a focal cerebral lesion.  1. 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  2. ESRD on peritoneal dialysis.  3. Has had prior strokes, as seen on CT and MRI of brain. The most prominent of these is medium-sized and involves the right parietal lobe. These infarcts are new since 2009. 4. MRI reveals gyriform restricted diffusion involving a 3-4 cm area of the left parietal lobe. This finding appears most consistent with cytotoxic edema secondary to seizure activity.  5. EEG was abnormal, with  presence of mild to moderate diffuse slowing of the waking background and additional focal slowing over the left temporal region. The latter finding is most likely postictal and corresponds anatomically to the aphasia noted yesterday.   Plan: 1. MRA of head, carotid ultrasound and TTE were initially recommended but can be discontinued as there is no acute stroke on MRI.  2.  Speech consult 3. TSH level has been ordered. 4. Continue ASA and Crestor.  5. Frequent neuro checks. 6. Since Vimpat is cleared by dialysis, another anticonvulsant would be a better choice. Keppra also cleared by dialysis, requiring extra dosing on dialysis days. A good option would be valproic acid, 5 mg/kg po TID. An IV load of valproic acid, 15 mg/kg can be administered prior to starting scheduled dosing.    LOS: 0 days   @Electronically  signed: Dr. Kerney Elbe 03/02/2016  8:15 AM

## 2016-03-02 NOTE — Progress Notes (Signed)
  [facility] Progress Note   Subjective: no new c/o's  Vitals:   03/02/16 0024 03/02/16 0224 03/02/16 0422 03/02/16 1035  BP: (!) 155/73 92/71 134/64 133/69  Pulse: 88 74 78 81  Resp: 18 18 18 16   Temp: 98.6 F (37 C) 98.7 F (37.1 C) 98.7 F (37.1 C) 98.6 F (37 C)  TempSrc: Oral Oral Oral Oral  SpO2: 93% 97% 98% 99%  Weight:   65.8 kg (145 lb 1.6 oz)   Height:        Inpatient medications: . aspirin EC  81 mg Oral QPM  . dialysis solution 1.5% low-MG/low-CA  13 L Intraperitoneal Q24H  . gentamicin cream  1 application Topical Daily  . heparin  5,000 Units Subcutaneous Q8H  . mometasone-formoterol  2 puff Inhalation BID  . pantoprazole  40 mg Oral Daily  . rosuvastatin  10 mg Oral QHS  . senna-docusate  1 tablet Oral Daily  . sodium chloride flush  3 mL Intravenous Q12H  . tiotropium  18 mcg Inhalation QHS  . valproate sodium  750 mg Intravenous Once  . valproic acid  250 mg Oral Q8H    acetaminophen **OR** acetaminophen, heparin, LORazepam, ondansetron **OR** ondansetron (ZOFRAN) IV, senna-docusate  Exam: Gen frail elderly WF, a bit cachectic, no distress, calm No rash, cyanosis or gangrene Sclera anicteric, throat clear and moist  No jvd or bruits Chest clear bilat to bases RRR no MRG Abd soft ntnd no mass or ascites +bs, R mid abdomen PD cath intact GU defer MS no joint effusions or deformity Ext no LE or UE edema / no wounds or ulcers Neuro is alert, Ox 3 , nf, moves UE's and LE's equally, mild gen'd weakness    Dialysis:  peritoneal dialysis/ Dr Rolly Salter 5 exchanges/ 24 hrs, 2600 fill overnight volume, no daytime dwell, all 1.5%, 3 hrs dwell (? 3hrs accuracy)   Assessment: 1. AMS - per primary likely had a seizure. Getting VPA IV load.   2. Hx recent CVA - old CVA's as well per MRI done at [facility] in November 3. COPD 4. Tobacco use 5. Systolic heart failure - last EF 30% 6. ESRD on PD - f/b [facility] 7. DNR 8. BP/  volume - not on BP meds at home or here; euvolemic on exam   Plan - cont CCPD all 1.5% for now   Kelly Splinter MD [facility] pager [phone]   03/02/2016, 1:29 PM    Recent Labs Lab 03/01/16 1101 03/01/16 1108 03/02/16 0702  NA 139 138 139  K 3.6 3.6 3.2*  CL 98* 97* 97*  CO2 28  --  27  GLUCOSE 105* 101* 101*  BUN 25* 28* 26*  CREATININE 10.07* 9.80* 9.80*  CALCIUM 9.4  --  9.1    Recent Labs Lab 03/01/16 1101  AST 21  ALT 10*  ALKPHOS 57  BILITOT 0.4  PROT 6.0*  ALBUMIN 2.5*    Recent Labs Lab 03/01/16 1101 03/01/16 1108 03/02/16 0702  WBC 11.1*  --  10.2  NEUTROABS 7.0  --   --   HGB 9.3* 9.2* 8.8*  HCT 29.2* 27.0* 28.7*  MCV 96.1  --  98.6  PLT 363  --  366   Iron/TIBC/Ferritin/ %Sat No results found for: IRON, TIBC, FERRITIN, IRONPCTSAT

## 2016-03-02 NOTE — Progress Notes (Signed)
Spoke with the son who was in contact with the patient's OP PD center this evening. He relayed her usual PD regimen to me and I paged the Nephrologist with that information. New PD orders received and transcribed.

## 2016-03-02 NOTE — Care Management Note (Signed)
Case Management Note  Patient Details  Name: Lydia Clark MRN: [id number] Date of Birth: [birth year]  Subjective/Objective:   Pt in with new onset seizure. She is from home with family.  Pt active with [facility] prior to admission.                 Action/Plan: Awaiting PT/OT recommendations. CM left message for son to call CM to discuss discharge plans. CM following.  Expected Discharge Date:                  Expected Discharge Plan:  Home/Self Care  In-House Referral:     Discharge planning Services     Post Acute Care Choice:    Choice offered to:     DME Arranged:    DME Agency:     HH Arranged:    HH Agency:     Status of Service:  In process, will continue to follow  If discussed at Long Length of Stay Meetings, dates discussed:    Additional Comments:  Pollie Friar, RN 03/02/2016, 4:03 PM

## 2016-03-02 NOTE — Procedures (Signed)
ELECTROENCEPHALOGRAM REPORT  Date of Study: 03/01/2016 (assigned to this reader on 03/02/16)  Patient's Name: Lydia Clark MRN: [id number] Date of Birth: 08-31-1940  Referring Provider: Dr. Linna Darner  Clinical History: This is a 75 year old woman with altered mental status and difficulty speaking.  Medications: acetaminophen (TYLENOL) tablet 650 mg  aspirin EC tablet 81 mg  docusate sodium (COLACE) capsule 100 mg  heparin injection 5,000 Units  LORazepam (ATIVAN) injection 1 mg  mometasone-formoterol (DULERA) 200-5 MCG/ACT inhaler 2 puff  pantoprazole (PROTONIX) EC tablet 40 mg  rosuvastatin (CRESTOR) tablet 10 mg  senna-docusate (Senokot-S) tablet 1 tablet  sennosides-docusate sodium (SENOKOT-S) 8.6-50 MG tablet 1 tablet  sodium chloride flush (NS) 0.9 % injection 3 mL  tiotropium (SPIRIVA) inhalation capsule 18 mcg   Technical Summary: A multichannel digital EEG recording measured by the international 10-20 system with electrodes applied with paste and impedances below 5000 ohms performed as portable with EKG monitoring in an awake patient.  Hyperventilation and photic stimulation were not performed.  The digital EEG was referentially recorded, reformatted, and digitally filtered in a variety of bipolar and referential montages for optimal display.   Description: The patient is awake during the recording.  During maximal wakefulness, there is a symmetric, medium voltage 9 Hz posterior dominant rhythm that poorly attenuates with eye opening and eye closure. This is admixed with a small amount of diffuse 4-5 Hz theta and 2-3 Hz delta slowing of the waking background, with additional focal polymorphic delta slowing over the left temporal region, at times sharply contoured without clear epileptogenic potential. Normal sleep architecture is not seen. Hyperventilation and photic stimulation were not performed.  There were no clear epileptiform discharges or electrographic seizures  seen.    EKG lead was unremarkable.  Impression: This awake EEG is abnormal due to the presence of: 1. Mild to moderate diffuse slowing of the waking background 2. Additional focal slowing over the left temporal region  Clinical Correlation of the above findings indicates diffuse cerebral dysfunction that is non-specific in etiology and can be seen with hypoxic/ischemic injury, toxic/metabolic encephalopathies, or medication effect. Additional focal slowing over the left temporal region indicates focal cerebral dysfunction in this region suggestive of underlying structural or physiologic abnormality. The absence of epileptiform discharges does not rule out a clinical diagnosis of epilepsy.  Clinical correlation is advised.   Ellouise Newer, M.D.

## 2016-03-03 DIAGNOSIS — N186 End stage renal disease: Secondary | ICD-10-CM | POA: Diagnosis not present

## 2016-03-03 DIAGNOSIS — Z992 Dependence on renal dialysis: Secondary | ICD-10-CM | POA: Diagnosis not present

## 2016-03-03 LAB — VALPROIC ACID LEVEL: VALPROIC ACID LVL: 30 ug/mL — AB (ref 50.0–100.0)

## 2016-03-03 MED ORDER — VALPROIC ACID 250 MG PO CAPS: 250.0000 mg | ORAL_CAPSULE | Freq: Three times a day (TID) | ORAL | 0 refills | Status: AC

## 2016-03-03 NOTE — Discharge Planning (Signed)
RN and family assisted patient to chair to assess mobility and function.  Patient was able to pivot, but unable to maintain upright position safely once in chair.  Family concerned about safety with DC to home and would like to consider possible rehab and/or even placement.  SW informed of family concerns and asked to discuss options with family.

## 2016-03-03 NOTE — Discharge Summary (Signed)
Physician Discharge Summary  Lydia Clark [id number] DOB: 07/05/1940 DOA: 03/01/2016  PCP: Lydia Dana, MD  Admit date: 03/01/2016 Discharge date: 03/03/2016   Recommendations for Outpatient Follow-Up:   1. Return to hospice at home vs LT facility 2. 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   History of Present Illness:   Lydia Clark is a 75 y.o. female with medical history significant for CHF, chronic kidney disease, COPD, GERD, hypertension, recent stroke presents to the emergency Department chief complaint altered mental status and difficulty speaking. Sleep patient was code stroke which was discontinued after evaluation by neurology. Being admitted for observation as symptoms have improved. Question seizure versus stroke  Information is obtained from the family is at the bedside and the patient noting that information from patient may be unreliable secondary to acute encephalopathy. Family reports patient was in her usual state of health eating breakfast this morning and then all of a sudden became "unresponsive" she did not lose consciousness. She just turned her head to the left and was "smacking her lips" staring and not responding to questions and commands directions. This lasted for a few minutes and then she "cleared up". Then a second episode mostly identical to the first occurred again. Once she became responsive they found her to be somewhat irritable but would follow commands and speak. They deny any recent illness nausea vomiting  diarrhea. They do report she's had a decreased oral intake since her stroke last month. She uses a cane and has an unsteady gait but they deny any recent falls. His on peritoneal dialysis and had dialysis last night per her usual. Recent complaints of headache dizziness syncope or near-syncope. No complaints of chest pain palpitations shortness of breath lower extremity edema. No recent fever chills travel or sick contacts.   Clark Course by Problem:   Acute encephalopathy.  -suspected due to seizures -loaded with valproic acid IV 15 mg/kg x 1 then 5 mh/kd PO TID (250 mg TID)  History of stroke. Chart review indicates admitted to [facility] November 11 with similar presentation. Workup revealed old and acute infarct. 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 Discharged with resolved symptoms.  -Discharged on aspirin and statin -Note also indicates there was a concern for seizure. She had a 24-hour continuous video EEG was remarkable for right-sided slowing compared to left -Of note patient is on hospice- to return once discharged  Chronic kidney disease stage IV. Patient on peritoneal dialysis -Nephrology consulted  Chronic systolic heart failure. Appears compensated. Chart review indicates echo done 2016 with an EF of 30%.  -Continue peritoneal dialysis  COPD. Not on home oxygen. Review indicates pulmonary function tests done 1 year ago. Note indicates tests consistent with moderate/borderline severe emphysema with severe air trapping -Continue home meds   Tobacco use -Cessation counseling offered    Medical Consultants:    Neuro  renal  Discharge Exam:   Vitals:   03/03/16 0915 03/03/16 1123  BP: 128/72 123/62  Pulse: 82 85  Resp: 18 19  Temp: 98.6 F (37 C) 98.4 F (36.9 C)   Vitals:   03/03/16 0111 03/03/16 0512  03/03/16 0915 03/03/16 1123  BP: 139/74 133/78 128/72 123/62  Pulse: 83 89 82 85  Resp: 20 20 18 19   Temp: 98.8 F (37.1 C) 98.5 F (36.9 C) 98.6 F (37 C) 98.4 F (36.9 C)  TempSrc: Oral Oral Oral Oral  SpO2: 97% (!) 88% 91% 94%  Weight:  63.6 kg (140 lb 4.8 oz)    Height:        Gen:  NAD   The results of significant diagnostics from this hospitalization (including imaging, microbiology, ancillary and laboratory) are listed below for reference.     Procedures and Diagnostic Studies:   Mr Brain 75 Contrast  Result Date: 03/01/2016 CLINICAL DATA:  75 year old female with mental status change, abnormal head CT with posterior right MCA ischemic sequelae which is new since 2016. Possible seizure today. Initial encounter. EXAM: MRI HEAD WITHOUT CONTRAST TECHNIQUE: Multiplanar, multiecho pulse sequences of the brain and surrounding structures were obtained without intravenous contrast. COMPARISON:  Head CT without contrast 1105 hours today. Prior studies including brain MRI 12/10/2007 FINDINGS: Brain: There is gyriform diffusion abnormality involving a 3-4 cm area of the posterior left hemisphere corresponding to the left parietal lobe (series 11, image 33). No associated hemorrhage or mass effect. There is posterior right MCA territory encephalomalacia primarily affecting the right parietal lobe with laminar necrosis and hemosiderin. There is heterogeneous trace diffusion signal in this parenchyma with no definite acute ischemia here. No other restricted diffusion. Chronic central and left superior cerebellar infarcts are new since 2009. Chronic lacunar infarct of the medial left thalamus is also new. Patchy and confluent bilateral cerebral white matter T2 and FLAIR hyperintensity is also new. There is also a small inferior left PCA territory infarct with encephalomalacia in the left occipital lobe which is new. Generalized cerebral volume loss since 2009. No midline shift, mass effect,  evidence of mass lesion, ventriculomegaly, extra-axial collection or acute intracranial hemorrhage. Cervicomedullary junction and pituitary are within normal limits. Vascular: Major intracranial vascular flow voids are stable since 2009. Skull and upper cervical spine: Negative. Chronic hyperostosis of the calvarium. Sinuses/Orbits: Negative orbits soft tissues. Mild to moderate maxillary sinus mucosal thickening. Other Visualized paranasal sinuses and mastoids are stable and well pneumatized. Other: Visible internal auditory structures appear normal. Negative scalp soft tissues. IMPRESSION: 1. Gyriform restricted diffusion involving a 3-4 cm area of the left parietal lobe. Burtis Junes this is postictal, but the differential diagnosis includes an acute posterior left MCA cortical infarct. No associated hemorrhage or mass effect. 2. 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 This along with chronic infarcts in the left PCA and bilateral superior cerebellar artery territory are new since 2009. 3. Generalized cerebral volume loss and confluent periventricular white matter signal change also since 2009. Electronically Signed   By: Genevie Ann M.D.   On: 03/01/2016 15:43   Ct Head Code Stroke W/o Cm  Result Date: 03/01/2016 CLINICAL DATA:  Code stroke.  Altered mental status. EXAM: CT HEAD WITHOUT CONTRAST TECHNIQUE: Contiguous axial images were obtained from the base of the skull through the vertex without intravenous contrast. COMPARISON:  01/09/2015 FINDINGS: Brain: Interval infarcts in the right parietal region, left inferior occipital lobe, and left superior cerebellum. The right cerebral infarct is large.  These areas have a well-defined low-density appearance, with volume loss seen the level of the right lateral ventricle. Small vessel infarct seen in the low left thalamus. Subtle high-density asymmetrically in the left caudate head is likely mineralization. No associated swelling to  suggest acute hemorrhage. No acute hemorrhage seen throughout the scan. Chronic microvascular ischemic change in the periventricular white matter. Atrophy with ventriculomegaly. Vascular: No hyperdense vessel.  Atherosclerotic calcification. Skull: No acute finding Other: Results paged at the time of interpretation on 03/01/2016 at 11:21 am to Dr. Cheral Marker, with verbal confirmation. ASPECTS [facility] Stroke Program Early CT Score) Not performed in the setting of chronic infarcts and uncertain localizing symptom. IMPRESSION: 1. No acute hemorrhage or large vessel territory infarct. 2. Interval but chronic appearing infarcts in the right posterior cerebrum, left occipital cortex, and left superior cerebellum. The right parietal infarct is large volume and could obscure peri-infarct ischemia. 3. Interval low and medial left thalamus infarct. Electronically Signed   By: Monte Fantasia M.D.   On: 03/01/2016 11:27     Labs:   Basic Metabolic Panel:  Recent Labs Lab 03/01/16 1101 03/01/16 1108 03/02/16 0702  NA 139 138 139  K 3.6 3.6 3.2*  CL 98* 97* 97*  CO2 28  --  27  GLUCOSE 105* 101* 101*  BUN 25* 28* 26*  CREATININE 10.07* 9.80* 9.80*  CALCIUM 9.4  --  9.1   GFR Estimated Creatinine Clearance: 5.1 mL/min (by C-G formula based on SCr of 9.8 mg/dL (H)). Liver Function Tests:  Recent Labs Lab 03/01/16 1101  AST 21  ALT 10*  ALKPHOS 57  BILITOT 0.4  PROT 6.0*  ALBUMIN 2.5*   No results for input(s): LIPASE, AMYLASE in the last 168 hours.  Recent Labs Lab 03/01/16 1648  AMMONIA 19   Coagulation profile  Recent Labs Lab 03/01/16 1101  INR 0.88    CBC:  Recent Labs Lab 03/01/16 1101 03/01/16 1108 03/01/16 1648 03/02/16 0702  WBC 11.1*  --   --  10.2  NEUTROABS 7.0  --   --   --   HGB 9.3* 9.2*  --  8.8*  HCT 29.2* 27.0* 25.8* 28.7*  MCV 96.1  --   --  98.6  PLT 363  --   --  366   Cardiac Enzymes: No results for input(s): CKTOTAL, CKMB, CKMBINDEX, TROPONINI  in the last 168 hours. BNP: Invalid input(s): POCBNP CBG:  Recent Labs Lab 03/01/16 1131  GLUCAP 104*   D-Dimer No results for input(s): DDIMER in the last 72 hours. Hgb A1c No results for input(s): HGBA1C in the last 72 hours. Lipid Profile No results for input(s): CHOL, HDL, LDLCALC, TRIG, CHOLHDL, LDLDIRECT in the last 72 hours. Thyroid function studies  Recent Labs  03/02/16 0956  TSH 1.119   Anemia work up  Recent Labs  03/01/16 1648  [id number]   Microbiology No results found for this or any previous visit (from the past 240 hour(s)).   Discharge Instructions:   Discharge Instructions    Discharge instructions    Complete by:  As directed    Resume hospice at home depakote level PRN Resume peritoneal dialysis Renal diet   Increase activity slowly    Complete by:  As directed      Allergies as of 03/03/2016      Reactions   Bee Venom Swelling, Other (See Comments)   Breathing problems   Biaxin [clarithromycin] Other (See Comments)   Throat swells   Hydrocodone Nausea  And Vomiting   Mycostatin [nystatin] Other (See Comments)   Blisters from the ointment   Polysorbate    Other reaction(s): Unknown   Sulfa Antibiotics Other (See Comments)   "welps"   Penicillins Rash   Has patient had a PCN reaction causing immediate rash, faciYesal/tongue/throat swelling, SOB or lightheadedness with hypotension:  Has patient had a PCN reaction causing severe rash involving mucus membranes or skin necrosis: No Has patient had a PCN reaction that required hospitalization No Has patient had a PCN reaction occurring within the last 10 years: No If all of the above answers are "NO", then may proceed with Cephalosporin use.      Medication List    TAKE these medications   amitriptyline 25 MG tablet Commonly known as:  ELAVIL Take 25 mg by mouth at bedtime.   aspirin 81 MG tablet Take 81 mg by mouth every evening.   calcium acetate 667 MG capsule Commonly  known as:  PHOSLO Take 667 mg by mouth 3 (three) times daily with meals.   docusate sodium 100 MG capsule Commonly known as:  COLACE Take 100 mg by mouth every evening.   Fluticasone-Salmeterol 250-50 MCG/DOSE Aepb Commonly known as:  ADVAIR DISKUS Inhale 1 puff into the lungs every 12 (twelve) hours.   omeprazole 20 MG capsule Commonly known as:  PRILOSEC Take 20 mg by mouth daily.   PRESCRIPTION MEDICATION Apply 1 application topically at bedtime as needed (to dialysis site). cream   rosuvastatin 10 MG tablet Commonly known as:  CRESTOR Take 10 mg by mouth at bedtime.   sennosides-docusate sodium 8.6-50 MG tablet Commonly known as:  SENOKOT-S Take 1 tablet by mouth daily.   tiotropium 18 MCG inhalation capsule Commonly known as:  SPIRIVA Place 1 capsule (18 mcg total) into inhaler and inhale at bedtime.   valproic acid 250 MG capsule Commonly known as:  DEPAKENE Take 1 capsule (250 mg total) by mouth every 8 (eight) hours.         Time coordinating discharge: 35 min  Signed:  Chrisopher Pustejovsky U Mihaela Fajardo   [facility] 03/03/2016, 12:17 PM

## 2016-03-03 NOTE — Discharge Planning (Addendum)
Patient IV removed.  Discharge papers given, explained and educated.  Packet made and sent with EMS transporting to [facility].  Report called and s/w Delrae Rend.          Marland Kitchen Patient going to room 28A . RN assessment and VS revealed stability for DC to facility.  Family informed of DC and following patient to facility.  Family told of suggested FU appts and also script sent to [facility].

## 2016-03-03 NOTE — Clinical Social Work Placement (Signed)
   CLINICAL SOCIAL WORK PLACEMENT  NOTE  Date:  03/03/2016  Patient Details  Name: Lydia Clark MRN: [id number] Date of Birth: Aug 02, 1940  Clinical Social Work is seeking post-discharge placement for this patient at the [place] level of care (*CSW will initial, date and re-position this form in  chart as items are completed):  Yes   Patient/family provided with [place] Work Department's list of facilities offering this level of care within the geographic area requested by the patient (or if unable, by the patient's family).  Yes   Patient/family informed of their freedom to choose among providers that offer the needed level of care, that participate in Medicare, Medicaid or managed care program needed by the patient, have an available bed and are willing to accept the patient.  Yes   Patient/family informed of [place]'s ownership interest in [facility] and [facility], as well as of the fact that they are under no obligation to receive care at these facilities.  PASRR submitted to EDS on 03/03/16     PASRR number received on       Existing PASRR number confirmed on 03/03/16     FL2 transmitted to all facilities in geographic area requested by pt/family on 03/03/16     FL2 transmitted to all facilities within larger geographic area on       Patient informed that his/her managed care company has contracts with or will negotiate with certain facilities, including the following:            Patient/family informed of bed offers received.  Patient chooses bed at       Physician recommends and patient chooses bed at      Patient to be transferred to   on  .  Patient to be transferred to facility by       Patient family notified on   of transfer.  Name of family member notified:        PHYSICIAN Please sign FL2     Additional Comment:    _______________________________________________ Candie Chroman, LCSW 03/03/2016,  1:30 PM

## 2016-03-03 NOTE — Clinical Social Work Note (Signed)
Clinical Social Work Assessment  Patient Details  Name: Lydia Clark MRN: [id number] Date of Birth: 08-05-1940  Date of referral:  03/03/16               Reason for consult:  Facility Placement, Discharge Planning                Permission sought to share information with:  Facility Sport and exercise psychologist, Family Supports Permission granted to share information::  Yes, Verbal Permission Granted  Name::     Lydia Clark  Agency::  SNF's  Relationship::  Son/Legal guardian  Contact Information:  [phone]  Housing/Transportation Living arrangements for the past 2 months:  [place] of Information:  Patient, Medical Team, Adult Children Patient Interpreter Needed:  None Criminal Activity/Legal Involvement Pertinent to Current Situation/Hospitalization:  No - Comment as needed Significant Relationships:  Adult Children Lives with:    Do you feel safe going back to the place where you live?  No Need for family participation in patient care:  Yes (Comment)  Care giving concerns:  Patient's son inquiring about long-term SNF placement.   Social Worker assessment / plan:  CSW met with patient. No supports at bedside. CSW introduced role and explained that discharge planning would be discussed. Patient aware of her son's hopes for long-term SNF placement and she is agreeable. CSW confirmed with patient's son over the phone. CSW reminded patient's son that a bed cannot be guaranteed today. RNCM has already had this discussion with him. No further concerns. CSW encouraged patient and her son to contact CSW as needed. CSW will continue to follow patient and her son for support and facilitate discharge to SNF, if bed available today.  Employment status:  Retired Forensic scientist:  Medicare PT Recommendations:  Not assessed at this time Information / Referral to community resources:  [place]  Patient/Family's Response to care:  Patient and her son  agreeable to long-term SNF placement. Patient's son supportive and involved in patient's care. Patient and her son appreciated social work intervention.  Patient/Family's Understanding of and Emotional Response to Diagnosis, Current Treatment, and Prognosis:  Patient and her son understand and are agreeable to discharge plan. Patient and her son appear happy with hospital care.  Emotional Assessment Appearance:  Appears stated age Attitude/Demeanor/Rapport:  Other (Pleasant) Affect (typically observed):  Accepting, Appropriate, Calm, Pleasant Orientation:  Oriented to Self, Oriented to Place, Oriented to  Time, Oriented to Situation Alcohol / Substance use:  Tobacco Use Psych involvement (Current and /or in the community):  No (Comment)  Discharge Needs  Concerns to be addressed:  Care Coordination Readmission within the last 30 days:  No Current discharge risk:  None Barriers to Discharge:  Other (Long-term bed availability)   Candie Chroman, LCSW 03/03/2016, 1:27 PM

## 2016-03-03 NOTE — Clinical Social Work Placement (Signed)
   CLINICAL SOCIAL WORK PLACEMENT  NOTE  Date:  03/03/2016  Patient Details  Name: Lydia Clark MRN: [id number] Date of Birth: 11-Dec-1940  Clinical Social Work is seeking post-discharge placement for this patient at the [place] level of care (*CSW will initial, date and re-position this form in  chart as items are completed):  Yes   Patient/family provided with [place] Work Department's list of facilities offering this level of care within the geographic area requested by the patient (or if unable, by the patient's family).  Yes   Patient/family informed of their freedom to choose among providers that offer the needed level of care, that participate in Medicare, Medicaid or managed care program needed by the patient, have an available bed and are willing to accept the patient.  Yes   Patient/family informed of [place]'s ownership interest in [facility] and [facility], as well as of the fact that they are under no obligation to receive care at these facilities.  PASRR submitted to EDS on 03/03/16     PASRR number received on       Existing PASRR number confirmed on 03/03/16     FL2 transmitted to all facilities in geographic area requested by pt/family on 03/03/16     FL2 transmitted to all facilities within larger geographic area on       Patient informed that his/her managed care company has contracts with or will negotiate with certain facilities, including the following:        Yes   Patient/family informed of bed offers received.  Patient chooses bed at [facility]     Physician recommends and patient chooses bed at      Patient to be transferred to [facility] on 03/03/16.  Patient to be transferred to facility by [facility]     Patient family notified on 03/03/16 of transfer.  Name of family member notified:  Mellody Drown     PHYSICIAN Please sign DNR, Please prepare prescriptions     Additional  Comment:    _______________________________________________ Candie Chroman, LCSW 03/03/2016, 2:47 PM

## 2016-03-03 NOTE — Clinical Social Work Note (Signed)
CSW facilitated patient discharge including contacting patient family and facility to confirm patient discharge plans. Clinical information faxed to facility and family agreeable with plan. CSW arranged ambulance transport via [facility] to [facility]. RN to call report prior to discharge ([phone] Room 504).  CSW will sign off for now as social work intervention is no longer needed. Please consult [place] again if new needs arise.  Dayton Scrape, [place]

## 2016-03-03 NOTE — Progress Notes (Signed)
Pt discharged. Pts family interested in patient having rehab prior to return home. Pt has not had a qualifying 3 night stay in the hospital for Medicare to pay for rehab. Pts son Gerald Stabs) informed. Gerald Stabs states the patient has LT benefits for SNF. Gerald Stabs able to provide policy name and number. Gerald Stabs would like patient to go to a facility even if no rehab and he will have her private caregiver provide the rehab during the day. CSW informed.  Gerald Stabs also informed that his mother is d/ced and if we are not able to provide placement today she will need to discharge back home. He understands. CM following.

## 2016-03-03 NOTE — [place] FL2 (Signed)
[place] MEDICAID FL2 LEVEL OF CARE SCREENING TOOL     IDENTIFICATION  Patient Name: Lydia Clark Birthdate: 02/28/1941 Sex: female Admission Date (Current Location): 03/01/2016  County and Medicaid Number:  [place]   Facility and Address:  [facility], [address], [place], [place] [address]      Provider Number: [id number]  Attending Physician Name and Address:  Jessica U Vann, DO  Relative Name and Phone Number:       Current Level of Care: Hospital Recommended Level of Care: Skilled Nursing Facility (Long-term care) Prior Approval Number:    Date Approved/Denied:   PASRR Number: [id number]  Discharge Plan: SNF (Long-term care)    Current Diagnoses: Patient Active Problem List   Diagnosis Date Noted  . Altered mental status   . New onset seizure (HCC) 03/01/2016  . Seizure (HCC) 03/01/2016  . Acute encephalopathy 03/01/2016  . Stroke (HCC)   . Hypotension 08/09/2015  . Chronic systolic heart failure (HCC) 04/02/2015  . 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 Left hip pain 04/02/2015  . Femoral neck fracture, left, closed, initial encounter 01/09/2015  . COPD, mild (HCC) 04/01/2014  . Tobacco use disorder 04/01/2014  . History of breast cancer, left Stage 1 invasive, ER/PR p[os, Her 2 neg 07/23/2012    Orientation RESPIRATION BLADDER Height & Weight     Time, Self, Situation, Place  Normal Incontinent (Little urine - ESR disease. Peritoneal catheter.) Weight: 140 lb 4.8 oz (63.6 kg) Height:  5' 8" (172.7 cm)  BEHAVIORAL SYMPTOMS/MOOD NEUROLOGICAL BOWEL NUTRITION STATUS   (None) Convulsions/Seizures (Stroke) Continent Diet (Heart healthy/carb-modified)  AMBULATORY STATUS COMMUNICATION OF NEEDS Skin     Verbally Normal                       Personal Care Assistance Level of Assistance              Functional Limitations Info  Sight, Hearing, Speech Sight Info:  Adequate Hearing Info: Adequate Speech Info: Adequate    SPECIAL CARE FACTORS FREQUENCY  Blood pressure                    Contractures Contractures Info: Not present    Additional Factors Info  Code Status, Allergies Code Status Info: DNR Allergies Info: Bee Venom, Biaxin (Clarithromycin), Hydrocodone, Mycostatin (Nystatin), Polysorbate, Sulfa Antibiotics, Penicillins           Current Medications (03/03/2016):  This is the current hospital active medication list Current Facility-Administered Medications  Medication Dose Route Frequency Provider Last Rate Last Dose  . acetaminophen (TYLENOL) tablet 650 mg  650 mg Oral Q6H PRN Karen M Black, NP       Or  . acetaminophen (TYLENOL) suppository 650 mg  650 mg Rectal Q6H PRN Karen M Black, NP      . aspirin EC tablet 81 mg  81 mg Oral QPM Karen M Black, NP   81 mg at 03/02/16 1722  . dialysis solution 1.5% low-MG/low-CA SOLN 13 L  13 L Intraperitoneal Q24H Robert Schertz, MD   13 L at 03/02/16 2100  . gentamicin cream (GARAMYCIN) 0.1 % 1 application  1 application Topical Daily James W Lin, MD   1 application at 03/03/16 0903  . heparin 1000 unit/ml injection 2,500 Units  2,500 Units Intraperitoneal PRN James W Lin, MD      . heparin injection 5,000 Units  5,000   Units Subcutaneous Q8H Radene Gunning, NP   5,000 Units at 03/03/16 0515  . LORazepam (ATIVAN) injection 2 mg  2 mg Intravenous Q4H PRN Waldemar Dickens, MD      . mometasone-formoterol [facility]) 200-5 MCG/ACT inhaler 2 puff  2 puff Inhalation BID Radene Gunning, NP   2 puff at 03/03/16 0901  . ondansetron (ZOFRAN) tablet 4 mg  4 mg Oral Q6H PRN Radene Gunning, NP       Or  . ondansetron [facility]) injection 4 mg  4 mg Intravenous Q6H PRN Radene Gunning, NP      . pantoprazole (PROTONIX) EC tablet 40 mg  40 mg Oral Daily Radene Gunning, NP   40 mg at 03/03/16 0901  . rosuvastatin (CRESTOR) tablet 10 mg  10 mg Oral QHS Radene Gunning, NP   10 mg at 03/02/16 2123  .  senna-docusate (Senokot-S) tablet 1 tablet  1 tablet Oral Daily Radene Gunning, NP   1 tablet at 03/02/16 1722  . senna-docusate (Senokot-S) tablet 1 tablet  1 tablet Oral QHS PRN Lezlie Octave Black, NP      . sodium chloride flush (NS) 0.9 % injection 3 mL  3 mL Intravenous Q12H Radene Gunning, NP   3 mL at 03/03/16 0901  . tiotropium (SPIRIVA) inhalation capsule 18 mcg  18 mcg Inhalation QHS Radene Gunning, NP   18 mcg at 03/02/16 2227  . valproic acid (DEPAKENE) 250 MG capsule 250 mg  250 mg Oral Q8H Jessica U Vann, DO   250 mg at 03/03/16 7867     Discharge Medications: Please see discharge summary for a list of discharge medications.  Relevant Imaging Results:  Relevant Lab Results:   Additional Information SS#: [id number]. Looking for long-term placement. HD (ESRD on PD). Needs private room.  Candie Chroman, LCSW

## 2016-03-03 NOTE — Progress Notes (Signed)
[facility] Progress Note   Subjective: no new c/o's, sitting up and eating w help of family. NO SOB or cough, no CP or other c/o's.    Vitals:   03/02/16 2227 03/03/16 0111 03/03/16 0512 03/03/16 0915  BP:  139/74 133/78 128/72  Pulse:  83 89 82  Resp:  20 20 18   Temp:  98.8 F (37.1 C) 98.5 F (36.9 C) 98.6 F (37 C)  TempSrc:  Oral Oral Oral  SpO2: 98% 97% (!) 88% 91%  Weight:   63.6 kg (140 lb 4.8 oz)   Height:        Inpatient medications: . aspirin EC  81 mg Oral QPM  . dialysis solution 1.5% low-MG/low-CA  13 L Intraperitoneal Q24H  . gentamicin cream  1 application Topical Daily  . heparin  5,000 Units Subcutaneous Q8H  . mometasone-formoterol  2 puff Inhalation BID  . pantoprazole  40 mg Oral Daily  . rosuvastatin  10 mg Oral QHS  . senna-docusate  1 tablet Oral Daily  . sodium chloride flush  3 mL Intravenous Q12H  . tiotropium  18 mcg Inhalation QHS  . valproic acid  250 mg Oral Q8H    acetaminophen **OR** acetaminophen, heparin, LORazepam, ondansetron **OR** ondansetron (ZOFRAN) IV, senna-docusate  Exam: Gen frail elderly WF, a bit cachectic, no distress, calm No rash, cyanosis or gangrene Sclera anicteric, throat clear and moist  No jvd or bruits Chest clear bilat to bases RRR no MRG Abd soft ntnd no mass or ascites +bs, R mid abdomen PD cath intact GU defer MS no joint effusions or deformity Ext no LE or UE edema / no wounds or ulcers Neuro is alert, Ox 3 , nf, moves UE's and LE's equally, mild gen'd weakness    Dialysis:  peritoneal dialysis/ Dr Rolly Salter 5 exchanges/ 24 hrs, 2600 fill overnight volume, no daytime dwell, all 1.5%, 1hr 20 min dwell, dry wt 121-125 lb   Assessment: 1. AMS - likely seizures.  Starting on VPA.   2. Hx recent CVA - hx old CVA's as well per MRI done at [facility] in November 3. COPD 4. Tobacco use 5. Systolic heart failure - last EF 30% 6. ESRD on PD - f/b [facility] 7. DNR 8. BP/ volume  - not on BP meds at home or here. Wt's suggest up 15lbs but not sure accuracy, on exam not excess volume.  Will get [facility]   Plan - cont CCPD all 1.5% for now   Kelly Splinter MD [place] pager [phone]   03/03/2016, 11:11 AM    Recent Labs Lab 03/01/16 1101 03/01/16 1108 03/02/16 0702  NA 139 138 139  K 3.6 3.6 3.2*  CL 98* 97* 97*  CO2 28  --  27  GLUCOSE 105* 101* 101*  BUN 25* 28* 26*  CREATININE 10.07* 9.80* 9.80*  CALCIUM 9.4  --  9.1    Recent Labs Lab 03/01/16 1101  AST 21  ALT 10*  ALKPHOS 57  BILITOT 0.4  PROT 6.0*  ALBUMIN 2.5*    Recent Labs Lab 03/01/16 1101 03/01/16 1108 03/01/16 1648 03/02/16 0702  WBC 11.1*  --   --  10.2  NEUTROABS 7.0  --   --   --   HGB 9.3* 9.2*  --  8.8*  HCT 29.2* 27.0* 25.8* 28.7*  MCV 96.1  --   --  98.6  PLT 363  --   --  366   Iron/TIBC/Ferritin/ %Sat  No results found for: IRON, TIBC, FERRITIN, IRONPCTSAT

## 2016-03-04 DIAGNOSIS — I69898 Other sequelae of other cerebrovascular disease: Secondary | ICD-10-CM | POA: Diagnosis not present

## 2016-03-04 DIAGNOSIS — N186 End stage renal disease: Secondary | ICD-10-CM | POA: Diagnosis not present

## 2016-03-04 DIAGNOSIS — R41841 Cognitive communication deficit: Secondary | ICD-10-CM | POA: Diagnosis not present

## 2016-03-04 DIAGNOSIS — R4182 Altered mental status, unspecified: Secondary | ICD-10-CM | POA: Diagnosis not present

## 2016-03-04 DIAGNOSIS — R1312 Dysphagia, oropharyngeal phase: Secondary | ICD-10-CM | POA: Diagnosis not present

## 2016-03-04 DIAGNOSIS — Z992 Dependence on renal dialysis: Secondary | ICD-10-CM | POA: Diagnosis not present

## 2016-03-04 DIAGNOSIS — G4089 Other seizures: Secondary | ICD-10-CM | POA: Diagnosis not present

## 2016-03-04 DIAGNOSIS — R262 Difficulty in walking, not elsewhere classified: Secondary | ICD-10-CM | POA: Diagnosis not present

## 2016-03-04 DIAGNOSIS — Z741 Need for assistance with personal care: Secondary | ICD-10-CM | POA: Diagnosis not present

## 2016-03-04 DIAGNOSIS — M6281 Muscle weakness (generalized): Secondary | ICD-10-CM | POA: Diagnosis not present

## 2016-03-04 DIAGNOSIS — G9340 Encephalopathy, unspecified: Secondary | ICD-10-CM | POA: Diagnosis not present

## 2016-03-05 DIAGNOSIS — N186 End stage renal disease: Secondary | ICD-10-CM | POA: Diagnosis not present

## 2016-03-05 DIAGNOSIS — M6281 Muscle weakness (generalized): Secondary | ICD-10-CM | POA: Diagnosis not present

## 2016-03-05 DIAGNOSIS — R4182 Altered mental status, unspecified: Secondary | ICD-10-CM | POA: Diagnosis not present

## 2016-03-05 DIAGNOSIS — G9340 Encephalopathy, unspecified: Secondary | ICD-10-CM | POA: Diagnosis not present

## 2016-03-05 DIAGNOSIS — I69898 Other sequelae of other cerebrovascular disease: Secondary | ICD-10-CM | POA: Diagnosis not present

## 2016-03-05 DIAGNOSIS — G4089 Other seizures: Secondary | ICD-10-CM | POA: Diagnosis not present

## 2016-03-05 DIAGNOSIS — Z992 Dependence on renal dialysis: Secondary | ICD-10-CM | POA: Diagnosis not present

## 2016-03-05 DIAGNOSIS — R1312 Dysphagia, oropharyngeal phase: Secondary | ICD-10-CM | POA: Diagnosis not present

## 2016-03-06 DIAGNOSIS — R41841 Cognitive communication deficit: Secondary | ICD-10-CM | POA: Diagnosis not present

## 2016-03-06 DIAGNOSIS — G9340 Encephalopathy, unspecified: Secondary | ICD-10-CM | POA: Diagnosis not present

## 2016-03-06 DIAGNOSIS — I69898 Other sequelae of other cerebrovascular disease: Secondary | ICD-10-CM | POA: Diagnosis not present

## 2016-03-06 DIAGNOSIS — N186 End stage renal disease: Secondary | ICD-10-CM | POA: Diagnosis not present

## 2016-03-06 DIAGNOSIS — R4182 Altered mental status, unspecified: Secondary | ICD-10-CM | POA: Diagnosis not present

## 2016-03-06 DIAGNOSIS — G4089 Other seizures: Secondary | ICD-10-CM | POA: Diagnosis not present

## 2016-03-06 DIAGNOSIS — Z992 Dependence on renal dialysis: Secondary | ICD-10-CM | POA: Diagnosis not present

## 2016-03-06 DIAGNOSIS — R1312 Dysphagia, oropharyngeal phase: Secondary | ICD-10-CM | POA: Diagnosis not present

## 2016-03-06 DIAGNOSIS — R262 Difficulty in walking, not elsewhere classified: Secondary | ICD-10-CM | POA: Diagnosis not present

## 2016-03-06 DIAGNOSIS — M6281 Muscle weakness (generalized): Secondary | ICD-10-CM | POA: Diagnosis not present

## 2016-03-06 DIAGNOSIS — Z741 Need for assistance with personal care: Secondary | ICD-10-CM | POA: Diagnosis not present

## 2016-03-07 DIAGNOSIS — G4089 Other seizures: Secondary | ICD-10-CM | POA: Diagnosis not present

## 2016-03-07 DIAGNOSIS — N186 End stage renal disease: Secondary | ICD-10-CM | POA: Diagnosis not present

## 2016-03-07 DIAGNOSIS — I69898 Other sequelae of other cerebrovascular disease: Secondary | ICD-10-CM | POA: Diagnosis not present

## 2016-03-07 DIAGNOSIS — G9340 Encephalopathy, unspecified: Secondary | ICD-10-CM | POA: Diagnosis not present

## 2016-03-07 DIAGNOSIS — R4182 Altered mental status, unspecified: Secondary | ICD-10-CM | POA: Diagnosis not present

## 2016-03-07 DIAGNOSIS — R1312 Dysphagia, oropharyngeal phase: Secondary | ICD-10-CM | POA: Diagnosis not present

## 2016-03-07 DIAGNOSIS — Z992 Dependence on renal dialysis: Secondary | ICD-10-CM | POA: Diagnosis not present

## 2016-03-07 DIAGNOSIS — M6281 Muscle weakness (generalized): Secondary | ICD-10-CM | POA: Diagnosis not present

## 2016-03-08 DIAGNOSIS — M6281 Muscle weakness (generalized): Secondary | ICD-10-CM | POA: Diagnosis not present

## 2016-03-08 DIAGNOSIS — Z992 Dependence on renal dialysis: Secondary | ICD-10-CM | POA: Diagnosis not present

## 2016-03-08 DIAGNOSIS — R1312 Dysphagia, oropharyngeal phase: Secondary | ICD-10-CM | POA: Diagnosis not present

## 2016-03-08 DIAGNOSIS — I69898 Other sequelae of other cerebrovascular disease: Secondary | ICD-10-CM | POA: Diagnosis not present

## 2016-03-08 DIAGNOSIS — N186 End stage renal disease: Secondary | ICD-10-CM | POA: Diagnosis not present

## 2016-03-08 DIAGNOSIS — G4089 Other seizures: Secondary | ICD-10-CM | POA: Diagnosis not present

## 2016-03-08 DIAGNOSIS — R4182 Altered mental status, unspecified: Secondary | ICD-10-CM | POA: Diagnosis not present

## 2016-03-08 DIAGNOSIS — G9340 Encephalopathy, unspecified: Secondary | ICD-10-CM | POA: Diagnosis not present

## 2016-03-09 DIAGNOSIS — J449 Chronic obstructive pulmonary disease, unspecified: Secondary | ICD-10-CM | POA: Diagnosis not present

## 2016-03-09 DIAGNOSIS — I509 Heart failure, unspecified: Secondary | ICD-10-CM | POA: Diagnosis not present

## 2016-03-09 DIAGNOSIS — E785 Hyperlipidemia, unspecified: Secondary | ICD-10-CM | POA: Diagnosis not present

## 2016-03-09 DIAGNOSIS — Z992 Dependence on renal dialysis: Secondary | ICD-10-CM | POA: Diagnosis not present

## 2016-03-09 DIAGNOSIS — N186 End stage renal disease: Secondary | ICD-10-CM | POA: Diagnosis not present

## 2016-03-09 DIAGNOSIS — K219 Gastro-esophageal reflux disease without esophagitis: Secondary | ICD-10-CM | POA: Diagnosis not present

## 2016-03-09 DIAGNOSIS — R569 Unspecified convulsions: Secondary | ICD-10-CM | POA: Diagnosis not present

## 2016-03-09 DIAGNOSIS — I6990 Unspecified sequelae of unspecified cerebrovascular disease: Secondary | ICD-10-CM | POA: Diagnosis not present

## 2016-03-09 DIAGNOSIS — N189 Chronic kidney disease, unspecified: Secondary | ICD-10-CM | POA: Diagnosis not present

## 2016-03-10 DIAGNOSIS — N186 End stage renal disease: Secondary | ICD-10-CM | POA: Diagnosis not present

## 2016-03-10 DIAGNOSIS — Z992 Dependence on renal dialysis: Secondary | ICD-10-CM | POA: Diagnosis not present

## 2016-03-11 DIAGNOSIS — J449 Chronic obstructive pulmonary disease, unspecified: Secondary | ICD-10-CM | POA: Diagnosis not present

## 2016-03-11 DIAGNOSIS — Z992 Dependence on renal dialysis: Secondary | ICD-10-CM | POA: Diagnosis not present

## 2016-03-11 DIAGNOSIS — N186 End stage renal disease: Secondary | ICD-10-CM | POA: Diagnosis not present

## 2016-03-11 DIAGNOSIS — W19XXXA Unspecified fall, initial encounter: Secondary | ICD-10-CM | POA: Diagnosis not present

## 2016-03-11 DIAGNOSIS — R569 Unspecified convulsions: Secondary | ICD-10-CM | POA: Diagnosis not present

## 2016-03-11 DIAGNOSIS — I5020 Unspecified systolic (congestive) heart failure: Secondary | ICD-10-CM | POA: Diagnosis not present

## 2016-03-11 DIAGNOSIS — M25552 Pain in left hip: Secondary | ICD-10-CM | POA: Diagnosis not present

## 2016-03-11 DIAGNOSIS — G9340 Encephalopathy, unspecified: Secondary | ICD-10-CM | POA: Diagnosis not present

## 2016-03-12 DIAGNOSIS — Z992 Dependence on renal dialysis: Secondary | ICD-10-CM | POA: Diagnosis not present

## 2016-03-12 DIAGNOSIS — N186 End stage renal disease: Secondary | ICD-10-CM | POA: Diagnosis not present

## 2016-03-13 DIAGNOSIS — Z992 Dependence on renal dialysis: Secondary | ICD-10-CM | POA: Diagnosis not present

## 2016-03-13 DIAGNOSIS — N186 End stage renal disease: Secondary | ICD-10-CM | POA: Diagnosis not present

## 2016-03-14 DIAGNOSIS — M6281 Muscle weakness (generalized): Secondary | ICD-10-CM | POA: Diagnosis not present

## 2016-03-14 DIAGNOSIS — Z992 Dependence on renal dialysis: Secondary | ICD-10-CM | POA: Diagnosis not present

## 2016-03-14 DIAGNOSIS — G9340 Encephalopathy, unspecified: Secondary | ICD-10-CM | POA: Diagnosis not present

## 2016-03-14 DIAGNOSIS — R4182 Altered mental status, unspecified: Secondary | ICD-10-CM | POA: Diagnosis not present

## 2016-03-14 DIAGNOSIS — N186 End stage renal disease: Secondary | ICD-10-CM | POA: Diagnosis not present

## 2016-03-14 DIAGNOSIS — I69898 Other sequelae of other cerebrovascular disease: Secondary | ICD-10-CM | POA: Diagnosis not present

## 2016-03-14 DIAGNOSIS — G4089 Other seizures: Secondary | ICD-10-CM | POA: Diagnosis not present

## 2016-03-14 DIAGNOSIS — R1312 Dysphagia, oropharyngeal phase: Secondary | ICD-10-CM | POA: Diagnosis not present

## 2016-03-16 DIAGNOSIS — M1990 Unspecified osteoarthritis, unspecified site: Secondary | ICD-10-CM | POA: Diagnosis not present

## 2016-03-16 DIAGNOSIS — Z853 Personal history of malignant neoplasm of breast: Secondary | ICD-10-CM | POA: Diagnosis not present

## 2016-03-16 DIAGNOSIS — Z8673 Personal history of transient ischemic attack (TIA), and cerebral infarction without residual deficits: Secondary | ICD-10-CM | POA: Diagnosis not present

## 2016-03-16 DIAGNOSIS — F0150 Vascular dementia without behavioral disturbance: Secondary | ICD-10-CM | POA: Diagnosis not present

## 2016-03-16 DIAGNOSIS — Z87891 Personal history of nicotine dependence: Secondary | ICD-10-CM | POA: Diagnosis not present

## 2016-03-16 DIAGNOSIS — Z7982 Long term (current) use of aspirin: Secondary | ICD-10-CM | POA: Diagnosis not present

## 2016-03-16 DIAGNOSIS — I5022 Chronic systolic (congestive) heart failure: Secondary | ICD-10-CM | POA: Diagnosis not present

## 2016-03-16 DIAGNOSIS — E042 Nontoxic multinodular goiter: Secondary | ICD-10-CM | POA: Diagnosis not present

## 2016-03-16 DIAGNOSIS — K219 Gastro-esophageal reflux disease without esophagitis: Secondary | ICD-10-CM | POA: Diagnosis not present

## 2016-03-16 DIAGNOSIS — I132 Hypertensive heart and chronic kidney disease with heart failure and with stage 5 chronic kidney disease, or end stage renal disease: Secondary | ICD-10-CM | POA: Diagnosis not present

## 2016-03-16 DIAGNOSIS — N185 Chronic kidney disease, stage 5: Secondary | ICD-10-CM | POA: Diagnosis not present

## 2016-03-16 DIAGNOSIS — F419 Anxiety disorder, unspecified: Secondary | ICD-10-CM | POA: Diagnosis not present

## 2016-03-17 DIAGNOSIS — R6889 Other general symptoms and signs: Secondary | ICD-10-CM | POA: Diagnosis not present

## 2016-03-17 DIAGNOSIS — Z7401 Bed confinement status: Secondary | ICD-10-CM | POA: Diagnosis not present

## 2016-03-17 DIAGNOSIS — Z8673 Personal history of transient ischemic attack (TIA), and cerebral infarction without residual deficits: Secondary | ICD-10-CM | POA: Diagnosis not present

## 2016-03-17 DIAGNOSIS — N185 Chronic kidney disease, stage 5: Secondary | ICD-10-CM | POA: Diagnosis not present

## 2016-03-17 DIAGNOSIS — F0150 Vascular dementia without behavioral disturbance: Secondary | ICD-10-CM | POA: Diagnosis not present

## 2016-03-17 DIAGNOSIS — I132 Hypertensive heart and chronic kidney disease with heart failure and with stage 5 chronic kidney disease, or end stage renal disease: Secondary | ICD-10-CM | POA: Diagnosis not present

## 2016-03-17 DIAGNOSIS — I5022 Chronic systolic (congestive) heart failure: Secondary | ICD-10-CM | POA: Diagnosis not present

## 2016-03-17 DIAGNOSIS — F419 Anxiety disorder, unspecified: Secondary | ICD-10-CM | POA: Diagnosis not present

## 2016-03-18 DIAGNOSIS — N185 Chronic kidney disease, stage 5: Secondary | ICD-10-CM | POA: Diagnosis not present

## 2016-03-18 DIAGNOSIS — I132 Hypertensive heart and chronic kidney disease with heart failure and with stage 5 chronic kidney disease, or end stage renal disease: Secondary | ICD-10-CM | POA: Diagnosis not present

## 2016-03-18 DIAGNOSIS — Z8673 Personal history of transient ischemic attack (TIA), and cerebral infarction without residual deficits: Secondary | ICD-10-CM | POA: Diagnosis not present

## 2016-03-18 DIAGNOSIS — F419 Anxiety disorder, unspecified: Secondary | ICD-10-CM | POA: Diagnosis not present

## 2016-03-18 DIAGNOSIS — F0150 Vascular dementia without behavioral disturbance: Secondary | ICD-10-CM | POA: Diagnosis not present

## 2016-03-18 DIAGNOSIS — I5022 Chronic systolic (congestive) heart failure: Secondary | ICD-10-CM | POA: Diagnosis not present

## 2016-03-19 DIAGNOSIS — F0150 Vascular dementia without behavioral disturbance: Secondary | ICD-10-CM | POA: Diagnosis not present

## 2016-03-19 DIAGNOSIS — I5022 Chronic systolic (congestive) heart failure: Secondary | ICD-10-CM | POA: Diagnosis not present

## 2016-03-19 DIAGNOSIS — F419 Anxiety disorder, unspecified: Secondary | ICD-10-CM | POA: Diagnosis not present

## 2016-03-19 DIAGNOSIS — I132 Hypertensive heart and chronic kidney disease with heart failure and with stage 5 chronic kidney disease, or end stage renal disease: Secondary | ICD-10-CM | POA: Diagnosis not present

## 2016-03-19 DIAGNOSIS — Z8673 Personal history of transient ischemic attack (TIA), and cerebral infarction without residual deficits: Secondary | ICD-10-CM | POA: Diagnosis not present

## 2016-03-19 DIAGNOSIS — N185 Chronic kidney disease, stage 5: Secondary | ICD-10-CM | POA: Diagnosis not present

## 2016-03-20 DIAGNOSIS — Z8673 Personal history of transient ischemic attack (TIA), and cerebral infarction without residual deficits: Secondary | ICD-10-CM | POA: Diagnosis not present

## 2016-03-20 DIAGNOSIS — N185 Chronic kidney disease, stage 5: Secondary | ICD-10-CM | POA: Diagnosis not present

## 2016-03-20 DIAGNOSIS — I5022 Chronic systolic (congestive) heart failure: Secondary | ICD-10-CM | POA: Diagnosis not present

## 2016-03-20 DIAGNOSIS — F0150 Vascular dementia without behavioral disturbance: Secondary | ICD-10-CM | POA: Diagnosis not present

## 2016-03-20 DIAGNOSIS — I132 Hypertensive heart and chronic kidney disease with heart failure and with stage 5 chronic kidney disease, or end stage renal disease: Secondary | ICD-10-CM | POA: Diagnosis not present

## 2016-03-20 DIAGNOSIS — F419 Anxiety disorder, unspecified: Secondary | ICD-10-CM | POA: Diagnosis not present

## 2016-03-21 DIAGNOSIS — F419 Anxiety disorder, unspecified: Secondary | ICD-10-CM | POA: Diagnosis not present

## 2016-03-21 DIAGNOSIS — I132 Hypertensive heart and chronic kidney disease with heart failure and with stage 5 chronic kidney disease, or end stage renal disease: Secondary | ICD-10-CM | POA: Diagnosis not present

## 2016-03-21 DIAGNOSIS — Z8673 Personal history of transient ischemic attack (TIA), and cerebral infarction without residual deficits: Secondary | ICD-10-CM | POA: Diagnosis not present

## 2016-03-21 DIAGNOSIS — F0150 Vascular dementia without behavioral disturbance: Secondary | ICD-10-CM | POA: Diagnosis not present

## 2016-03-21 DIAGNOSIS — N185 Chronic kidney disease, stage 5: Secondary | ICD-10-CM | POA: Diagnosis not present

## 2016-03-21 DIAGNOSIS — I5022 Chronic systolic (congestive) heart failure: Secondary | ICD-10-CM | POA: Diagnosis not present

## 2016-03-22 ENCOUNTER — Ambulatory Visit: Payer: TRICARE For Life (TFL)

## 2016-03-22 DIAGNOSIS — F419 Anxiety disorder, unspecified: Secondary | ICD-10-CM | POA: Diagnosis not present

## 2016-03-22 DIAGNOSIS — Z8673 Personal history of transient ischemic attack (TIA), and cerebral infarction without residual deficits: Secondary | ICD-10-CM | POA: Diagnosis not present

## 2016-03-22 DIAGNOSIS — I5022 Chronic systolic (congestive) heart failure: Secondary | ICD-10-CM | POA: Diagnosis not present

## 2016-03-22 DIAGNOSIS — N185 Chronic kidney disease, stage 5: Secondary | ICD-10-CM | POA: Diagnosis not present

## 2016-03-22 DIAGNOSIS — F0150 Vascular dementia without behavioral disturbance: Secondary | ICD-10-CM | POA: Diagnosis not present

## 2016-03-22 DIAGNOSIS — I132 Hypertensive heart and chronic kidney disease with heart failure and with stage 5 chronic kidney disease, or end stage renal disease: Secondary | ICD-10-CM | POA: Diagnosis not present

## 2016-03-23 DIAGNOSIS — I5022 Chronic systolic (congestive) heart failure: Secondary | ICD-10-CM | POA: Diagnosis not present

## 2016-03-23 DIAGNOSIS — F0150 Vascular dementia without behavioral disturbance: Secondary | ICD-10-CM | POA: Diagnosis not present

## 2016-03-23 DIAGNOSIS — I132 Hypertensive heart and chronic kidney disease with heart failure and with stage 5 chronic kidney disease, or end stage renal disease: Secondary | ICD-10-CM | POA: Diagnosis not present

## 2016-03-23 DIAGNOSIS — N185 Chronic kidney disease, stage 5: Secondary | ICD-10-CM | POA: Diagnosis not present

## 2016-03-23 DIAGNOSIS — F419 Anxiety disorder, unspecified: Secondary | ICD-10-CM | POA: Diagnosis not present

## 2016-03-23 DIAGNOSIS — Z8673 Personal history of transient ischemic attack (TIA), and cerebral infarction without residual deficits: Secondary | ICD-10-CM | POA: Diagnosis not present

## 2016-03-24 DIAGNOSIS — F0150 Vascular dementia without behavioral disturbance: Secondary | ICD-10-CM | POA: Diagnosis not present

## 2016-03-24 DIAGNOSIS — I5022 Chronic systolic (congestive) heart failure: Secondary | ICD-10-CM | POA: Diagnosis not present

## 2016-03-24 DIAGNOSIS — N185 Chronic kidney disease, stage 5: Secondary | ICD-10-CM | POA: Diagnosis not present

## 2016-03-24 DIAGNOSIS — I132 Hypertensive heart and chronic kidney disease with heart failure and with stage 5 chronic kidney disease, or end stage renal disease: Secondary | ICD-10-CM | POA: Diagnosis not present

## 2016-03-24 DIAGNOSIS — Z8673 Personal history of transient ischemic attack (TIA), and cerebral infarction without residual deficits: Secondary | ICD-10-CM | POA: Diagnosis not present

## 2016-03-24 DIAGNOSIS — F419 Anxiety disorder, unspecified: Secondary | ICD-10-CM | POA: Diagnosis not present

## 2016-03-25 DIAGNOSIS — F419 Anxiety disorder, unspecified: Secondary | ICD-10-CM | POA: Diagnosis not present

## 2016-03-25 DIAGNOSIS — I5022 Chronic systolic (congestive) heart failure: Secondary | ICD-10-CM | POA: Diagnosis not present

## 2016-03-25 DIAGNOSIS — I132 Hypertensive heart and chronic kidney disease with heart failure and with stage 5 chronic kidney disease, or end stage renal disease: Secondary | ICD-10-CM | POA: Diagnosis not present

## 2016-03-25 DIAGNOSIS — N185 Chronic kidney disease, stage 5: Secondary | ICD-10-CM | POA: Diagnosis not present

## 2016-03-25 DIAGNOSIS — Z8673 Personal history of transient ischemic attack (TIA), and cerebral infarction without residual deficits: Secondary | ICD-10-CM | POA: Diagnosis not present

## 2016-03-25 DIAGNOSIS — F0150 Vascular dementia without behavioral disturbance: Secondary | ICD-10-CM | POA: Diagnosis not present

## 2016-03-26 DIAGNOSIS — I132 Hypertensive heart and chronic kidney disease with heart failure and with stage 5 chronic kidney disease, or end stage renal disease: Secondary | ICD-10-CM | POA: Diagnosis not present

## 2016-03-26 DIAGNOSIS — Z8673 Personal history of transient ischemic attack (TIA), and cerebral infarction without residual deficits: Secondary | ICD-10-CM | POA: Diagnosis not present

## 2016-03-26 DIAGNOSIS — N185 Chronic kidney disease, stage 5: Secondary | ICD-10-CM | POA: Diagnosis not present

## 2016-03-26 DIAGNOSIS — F419 Anxiety disorder, unspecified: Secondary | ICD-10-CM | POA: Diagnosis not present

## 2016-03-26 DIAGNOSIS — F0150 Vascular dementia without behavioral disturbance: Secondary | ICD-10-CM | POA: Diagnosis not present

## 2016-03-26 DIAGNOSIS — I5022 Chronic systolic (congestive) heart failure: Secondary | ICD-10-CM | POA: Diagnosis not present

## 2016-03-27 DIAGNOSIS — F419 Anxiety disorder, unspecified: Secondary | ICD-10-CM | POA: Diagnosis not present

## 2016-03-27 DIAGNOSIS — F0150 Vascular dementia without behavioral disturbance: Secondary | ICD-10-CM | POA: Diagnosis not present

## 2016-03-27 DIAGNOSIS — I132 Hypertensive heart and chronic kidney disease with heart failure and with stage 5 chronic kidney disease, or end stage renal disease: Secondary | ICD-10-CM | POA: Diagnosis not present

## 2016-03-27 DIAGNOSIS — I5022 Chronic systolic (congestive) heart failure: Secondary | ICD-10-CM | POA: Diagnosis not present

## 2016-03-27 DIAGNOSIS — Z8673 Personal history of transient ischemic attack (TIA), and cerebral infarction without residual deficits: Secondary | ICD-10-CM | POA: Diagnosis not present

## 2016-03-27 DIAGNOSIS — N185 Chronic kidney disease, stage 5: Secondary | ICD-10-CM | POA: Diagnosis not present

## 2016-04-06 DEATH — deceased

## 2016-09-15 IMAGING — CT CT CHEST W/O CM
2 of 6 series · 14 of 36 positions shown, 17 images · non-contrast
Comparison: No priors.

CLINICAL DATA: 73-year-old female with cough and shortness of
breath, persistent since February 2014. Evaluate for pulmonary
fibrosis. History of left-sided breast cancer status post lumpectomy
and radiation therapy. Thirty year history of smoking a half pack a
day.

EXAM:
CT CHEST WITHOUT CONTRAST
TECHNIQUE: Multidetector CT imaging of the chest was performed following the
standard protocol without IV contrast..

[Series 2: routine chest wo · axial · 0.61mm/px · z∈[-625,-350]mm · 11 of 65 slices shown, 14 images]
[im 5/65  mediastinal]
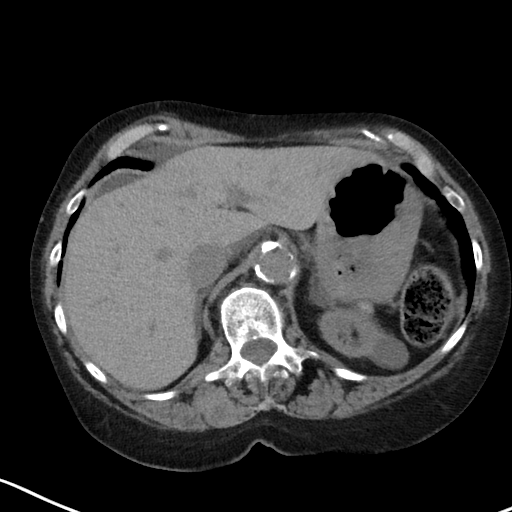
[im 5/65  lung]
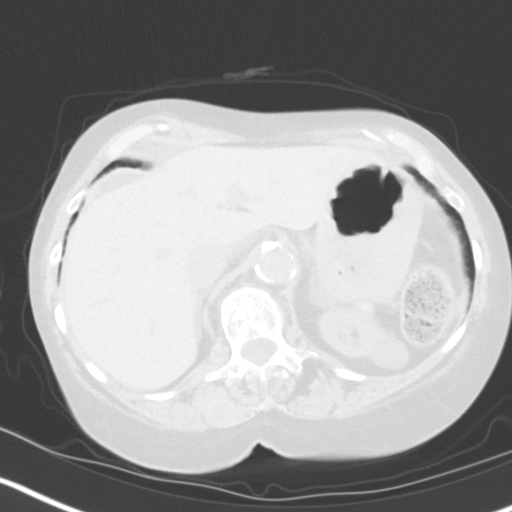
[im 9/65  lung]
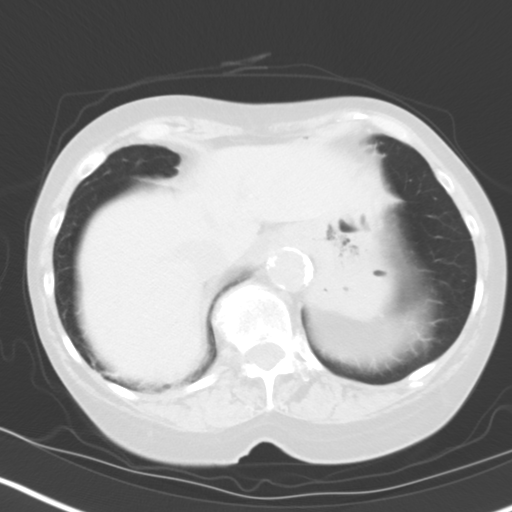
[im 18/65  lung]
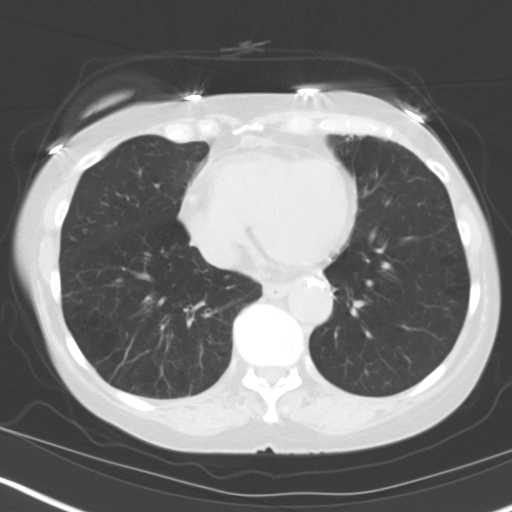
[im 22/65  lung]
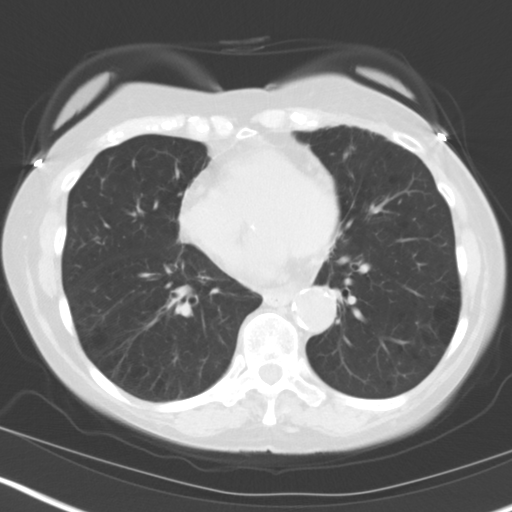
[im 26/65  mediastinal]
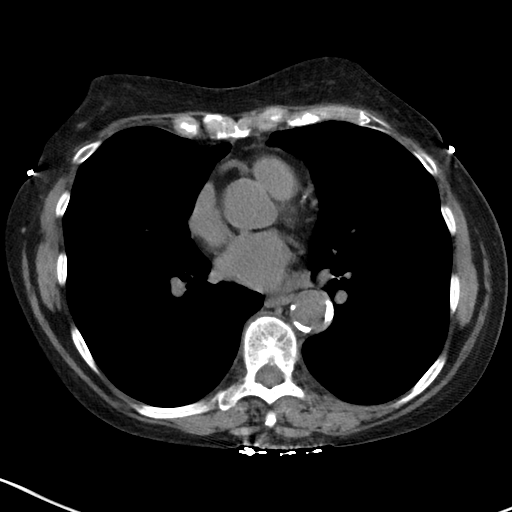
[im 26/65  lung]
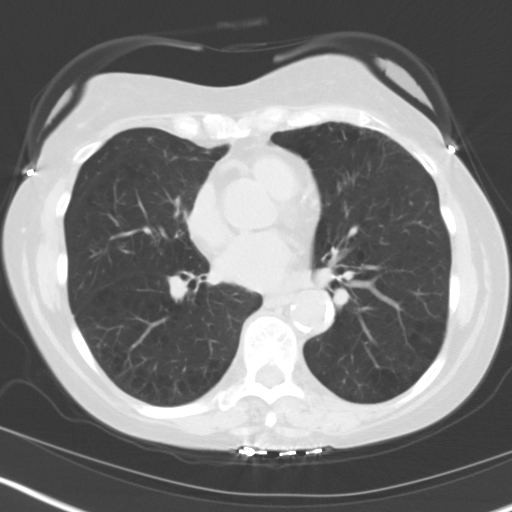
[im 35/65  lung]
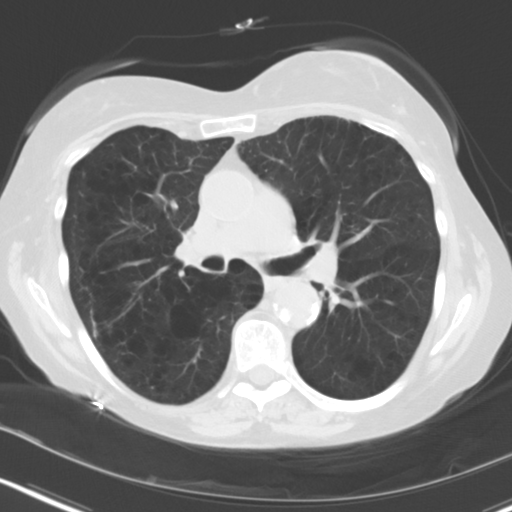
[im 39/65  lung]
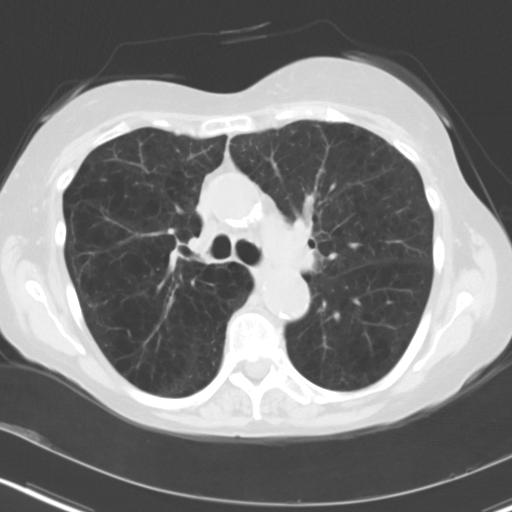
[im 43/65  lung]
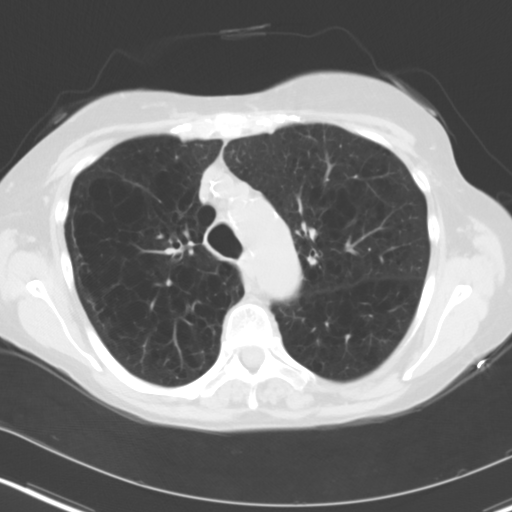
[im 47/65  mediastinal]
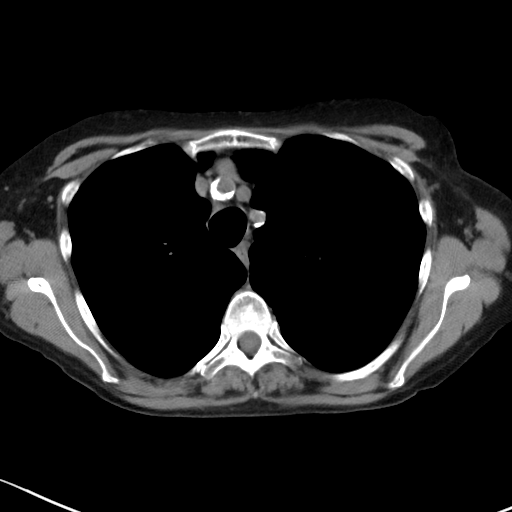
[im 47/65  lung]
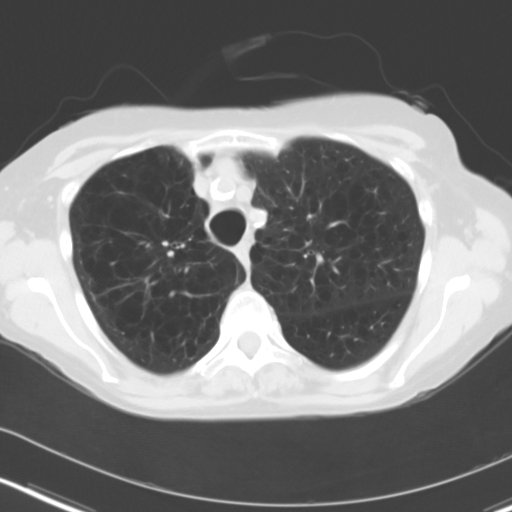
[im 56/65  lung]
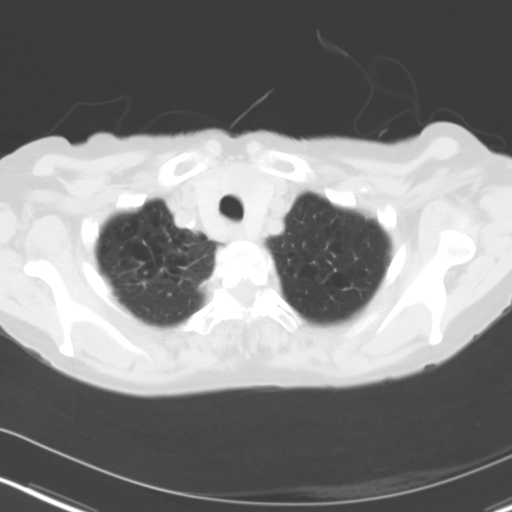
[im 60/65  lung]
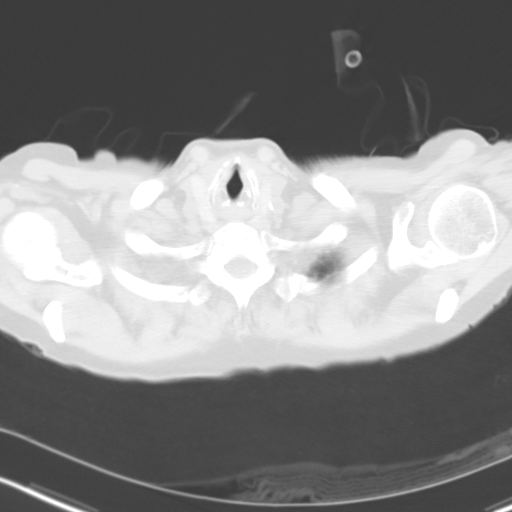

[Series 9: cor routine chest wo · coronal · 0.61mm/px · 3 of 128 slices shown]
[im 26/128  lung]
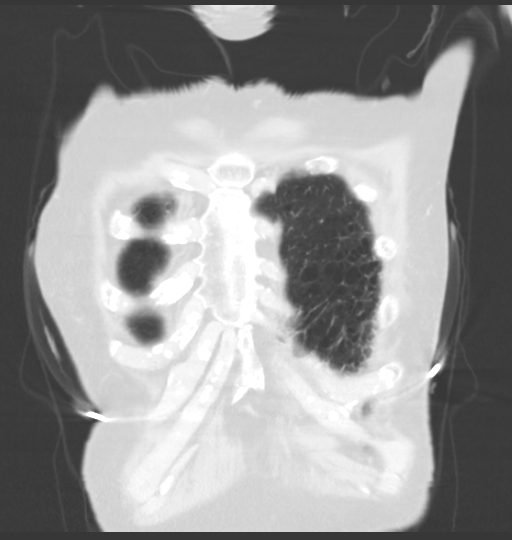
[im 51/128  lung]
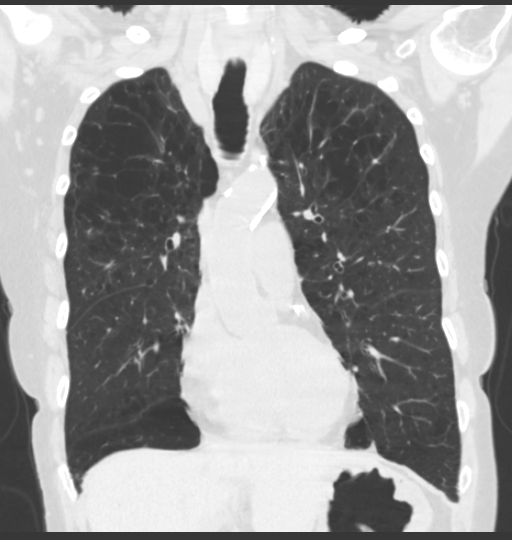
[im 77/128  lung]
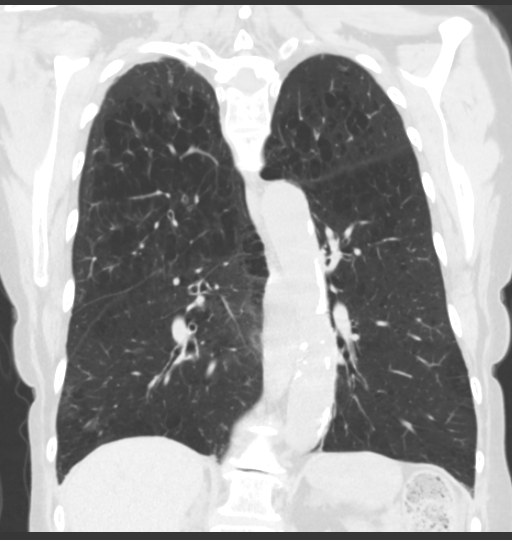

[14 of 36 positions shown; findings below may reference images not displayed]

FINDINGS: Mediastinum/Lymph Nodes: Heart size is normal. There is no
significant pericardial fluid, thickening or pericardial
calcification. There is atherosclerosis of the thoracic aorta, the
great vessels of the mediastinum and the coronary arteries,
including calcified atherosclerotic plaque in the left anterior
descending coronary artery. No pathologically enlarged mediastinal
or hilar lymph nodes. Please note that accurate exclusion of hilar
adenopathy is limited on noncontrast CT scans. Esophagus is
unremarkable in appearance. No axillary lymphadenopathy. Enlarged
and heterogeneous appearing thyroid gland, with multiple nodules,
largest of which is in the isthmus measuring 2.0 x 1.9 cm (image 10
of series 2).

Lungs/Pleura: Diffuse bronchial wall thickening. Severe
centrilobular emphysema. No acute consolidative airspace disease. No
pleural effusions. No suspicious appearing pulmonary nodules or
masses. High-resolution images demonstrate no significant regions of
ground-glass attenuation, subpleural reticulation, parenchymal
banding or frank honeycombing to suggest interstitial lung disease.
There is a focal area of peribronchovascular thickening and
architectural distortion in the right lower lobe, favored to reflect
an area of post infectious or inflammatory scarring. Inspiratory and
expiratory imaging is unremarkable.

Upper Abdomen: 2.5 cm low-attenuation lesion in the upper pole of
the left kidney, incompletely characterized, but similar to prior
study from 11/21/2012, likely a cyst. Other intermediate to high
attenuation lesions in the left kidney, largest of which is an
exophytic 1.4 cm lesion in the upper pole (image 61 of series 2),
only slightly larger than prior study 11/21/2012. Trace volume of
ascites noted adjacent to the liver. Diffuse low-attenuation
thickening of the left adrenal gland, favored to reflect adenomatous
hyperplasia.

Musculoskeletal/Soft Tissues: Old minimally displaced fracture of
the posterolateral aspect of the left eleventh rib, with probable
fibrous union. There are no aggressive appearing lytic or blastic
lesions noted in the visualized portions of the skeleton.
IMPRESSION: 1. No findings to suggest interstitial lung disease.
2. Mild scarring in the basal segments of the right lower lobe
posteriorly, likely sequela of prior infection or prior aspiration.
3. Mild diffuse bronchial wall thickening with severe centrilobular
emphysema; imaging findings suggestive of underlying COPD.
4. Atherosclerosis, including left anterior descending coronary
artery disease. Assessment for potential risk factor modification,
dietary therapy or pharmacologic therapy may be warranted, if
clinically indicated.
5. Multiple left renal lesions, relatively similar to remote prior
study 11/21/2012, favored to represent a combination of simple cysts
and proteinaceous or hemorrhagic cysts. These could be definitively
characterized with MRI of the abdomen with and without IV gadolinium
if of clinical concern.
6. Trace volume of fluid in the peritoneal cavity noted adjacent to
the liver, which may represent ascites, or could represent
peritoneal dialysate if the patient still has a Tenchkoff catheter.
7. Enlarged heterogeneous appearing thyroid gland, with multiple
nodules, as above. CT findings and thyroid disease are highly
nonspecific, and consideration for further evaluation with thyroid
ultrasound is recommended.
